# Patient Record
Sex: Female | Born: 1955 | Race: White | Hispanic: No | Smoking: Never smoker
Health system: Southern US, Community
[De-identification: ages and names within clinical notes are randomized; demographics above are authoritative.]

## PROBLEM LIST (undated history)

## (undated) DIAGNOSIS — N200 Calculus of kidney: Secondary | ICD-10-CM

## (undated) DIAGNOSIS — J189 Pneumonia, unspecified organism: Secondary | ICD-10-CM

## (undated) DIAGNOSIS — M069 Rheumatoid arthritis, unspecified: Secondary | ICD-10-CM

## (undated) DIAGNOSIS — D649 Anemia, unspecified: Secondary | ICD-10-CM

## (undated) DIAGNOSIS — K8689 Other specified diseases of pancreas: Secondary | ICD-10-CM

## (undated) DIAGNOSIS — R109 Unspecified abdominal pain: Secondary | ICD-10-CM

## (undated) DIAGNOSIS — K859 Acute pancreatitis without necrosis or infection, unspecified: Secondary | ICD-10-CM

## (undated) DIAGNOSIS — I10 Essential (primary) hypertension: Secondary | ICD-10-CM

## (undated) DIAGNOSIS — S22080A Wedge compression fracture of T11-T12 vertebra, initial encounter for closed fracture: Secondary | ICD-10-CM

## (undated) DIAGNOSIS — F419 Anxiety disorder, unspecified: Secondary | ICD-10-CM

## (undated) DIAGNOSIS — M4846XA Fatigue fracture of vertebra, lumbar region, initial encounter for fracture: Secondary | ICD-10-CM

## (undated) DIAGNOSIS — K760 Fatty (change of) liver, not elsewhere classified: Secondary | ICD-10-CM

## (undated) DIAGNOSIS — E876 Hypokalemia: Secondary | ICD-10-CM

## (undated) DIAGNOSIS — G8929 Other chronic pain: Secondary | ICD-10-CM

## (undated) HISTORY — PX: EXPLORATORY LAPAROTOMY W/ BOWEL RESECTION: SHX1544

## (undated) HISTORY — DX: Wedge compression fracture of t11-T12 vertebra, initial encounter for closed fracture: S22.080A

## (undated) HISTORY — DX: Other specified diseases of pancreas: K86.89

## (undated) HISTORY — PX: KNEE SURGERY: SHX244

## (undated) HISTORY — PX: KIDNEY STONE SURGERY: SHX686

## (undated) HISTORY — PX: OTHER SURGICAL HISTORY: SHX169

## (undated) HISTORY — PX: TOTAL HIP ARTHROPLASTY: SHX124

## (undated) HISTORY — PX: APPENDECTOMY: SHX54

## (undated) HISTORY — PX: ABDOMINAL HYSTERECTOMY: SHX81

## (undated) HISTORY — PX: CHOLECYSTECTOMY: SHX55

## (undated) HISTORY — PX: CARDIAC CATHETERIZATION: SHX172

## (undated) SURGERY — EGD (ESOPHAGOGASTRODUODENOSCOPY)
Anesthesia: Moderate Sedation | Laterality: Left

---

## 2006-03-22 DIAGNOSIS — M069 Rheumatoid arthritis, unspecified: Secondary | ICD-10-CM

## 2006-03-22 HISTORY — DX: Rheumatoid arthritis, unspecified: M06.9

## 2007-02-20 HISTORY — PX: ESOPHAGOGASTRODUODENOSCOPY: SHX1529

## 2009-03-22 DIAGNOSIS — S22080A Wedge compression fracture of T11-T12 vertebra, initial encounter for closed fracture: Secondary | ICD-10-CM

## 2009-03-22 HISTORY — DX: Wedge compression fracture of T11-T12 vertebra, initial encounter for closed fracture: S22.080A

## 2009-08-20 HISTORY — PX: ESOPHAGOGASTRODUODENOSCOPY: SHX1529

## 2010-04-22 HISTORY — PX: ESOPHAGOGASTRODUODENOSCOPY: SHX1529

## 2010-08-21 HISTORY — PX: ESOPHAGOGASTRODUODENOSCOPY: SHX1529

## 2011-03-20 ENCOUNTER — Inpatient Hospital Stay (HOSPITAL_COMMUNITY)
Admission: EM | Admit: 2011-03-20 | Discharge: 2011-03-23 | DRG: 392 | Disposition: A | Discharge status: Home / Self Care | Attending: Internal Medicine | Admitting: Internal Medicine

## 2011-03-20 ENCOUNTER — Other Ambulatory Visit: Payer: Self-pay

## 2011-03-20 DIAGNOSIS — E876 Hypokalemia: Secondary | ICD-10-CM | POA: Diagnosis present

## 2011-03-20 DIAGNOSIS — M069 Rheumatoid arthritis, unspecified: Secondary | ICD-10-CM | POA: Diagnosis present

## 2011-03-20 DIAGNOSIS — R112 Nausea with vomiting, unspecified: Secondary | ICD-10-CM | POA: Diagnosis present

## 2011-03-20 DIAGNOSIS — K760 Fatty (change of) liver, not elsewhere classified: Secondary | ICD-10-CM | POA: Diagnosis present

## 2011-03-20 DIAGNOSIS — K7689 Other specified diseases of liver: Secondary | ICD-10-CM | POA: Diagnosis present

## 2011-03-20 DIAGNOSIS — G8929 Other chronic pain: Secondary | ICD-10-CM

## 2011-03-20 DIAGNOSIS — R109 Unspecified abdominal pain: Secondary | ICD-10-CM | POA: Diagnosis present

## 2011-03-20 DIAGNOSIS — K5909 Other constipation: Principal | ICD-10-CM | POA: Diagnosis present

## 2011-03-20 DIAGNOSIS — Z79899 Other long term (current) drug therapy: Secondary | ICD-10-CM

## 2011-03-20 DIAGNOSIS — IMO0001 Reserved for inherently not codable concepts without codable children: Secondary | ICD-10-CM | POA: Diagnosis not present

## 2011-03-20 DIAGNOSIS — R030 Elevated blood-pressure reading, without diagnosis of hypertension: Secondary | ICD-10-CM | POA: Diagnosis present

## 2011-03-20 DIAGNOSIS — K5900 Constipation, unspecified: Secondary | ICD-10-CM | POA: Diagnosis present

## 2011-03-20 HISTORY — DX: Hypokalemia: E87.6

## 2011-03-20 HISTORY — DX: Fatty (change of) liver, not elsewhere classified: K76.0

## 2011-03-20 HISTORY — DX: Rheumatoid arthritis, unspecified: M06.9

## 2011-03-20 LAB — COMPREHENSIVE METABOLIC PANEL
ALT: 13 U/L (ref 0–35)
AST: 23 U/L (ref 0–37)
Albumin: 3.8 g/dL (ref 3.5–5.2)
Alkaline Phosphatase: 109 U/L (ref 39–117)
Chloride: 101 mEq/L (ref 96–112)
Potassium: 3.3 mEq/L — ABNORMAL LOW (ref 3.5–5.1)
Sodium: 139 mEq/L (ref 135–145)
Total Bilirubin: 0.3 mg/dL (ref 0.3–1.2)

## 2011-03-20 LAB — DIFFERENTIAL
Basophils Absolute: 0 10*3/uL (ref 0.0–0.1)
Basophils Relative: 1 % (ref 0–1)
Neutro Abs: 2.4 10*3/uL (ref 1.7–7.7)
Neutrophils Relative %: 58 % (ref 43–77)

## 2011-03-20 LAB — CBC
MCHC: 32.1 g/dL (ref 30.0–36.0)
RDW: 13.7 % (ref 11.5–15.5)
WBC: 4.1 10*3/uL (ref 4.0–10.5)

## 2011-03-20 LAB — TROPONIN I: Troponin I: <0.3 ng/mL (ref <0.30)

## 2011-03-20 MED ORDER — SODIUM CHLORIDE 0.9 % IV BOLUS (SEPSIS)
500.0000 mL | Freq: Once | INTRAVENOUS | Status: AC
  Administered 2011-03-20: 1000 mL via INTRAVENOUS

## 2011-03-20 MED ORDER — SODIUM CHLORIDE 0.9 % IV SOLN
INTRAVENOUS | Status: DC
  Administered 2011-03-20: 22:00:00 via INTRAVENOUS

## 2011-03-20 MED ORDER — HYDROMORPHONE HCL PF 1 MG/ML IJ SOLN
1.0000 mg | Freq: Once | INTRAMUSCULAR | Status: AC
  Administered 2011-03-20: 1 mg via INTRAVENOUS
  Filled 2011-03-20: qty 1

## 2011-03-20 MED ORDER — ONDANSETRON HCL 4 MG/2ML IJ SOLN
4.0000 mg | Freq: Once | INTRAMUSCULAR | Status: AC
  Administered 2011-03-20: 4 mg via INTRAVENOUS
  Filled 2011-03-20: qty 2

## 2011-03-20 NOTE — ED Provider Notes (Addendum)
This chart was scribed for [employer]. Rubin Payor, MD by Wallis Mart. The patient was seen in room APA11/APA11 and the patient's care was started at 9:28 PM.      CSN: [id number]  Arrival date & time 03/20/11  2038   First MD Initiated Contact with Patient 03/20/11 2127      Chief Complaint  Patient presents with  . Abdominal Pain  . Emesis    (Consider location/radiation/quality/duration/timing/severity/associated sxs/prior treatment) HPI Jordan Haney is a 55 y.o. female who presents to the Emergency Department complaining of gradual onset, intermittent, severe abdominal pain since 12/23. Pt was seen at [facility] yesterday and given an antinausea medicine that she was allergic to.  Pain radiates to her back and nothing improves or worsens the pain.  Pt c/o associated vomiting, constipation, cp.  Denies diarrhea, fever, dysuria.  Pt gets similar episodes once every few weeks.  Pt w/ h/o pancreatitis, gall bladder rupture w/ gall stones.    PCP: Dr. Henreitta Leber Past Medical History  Diagnosis Date  . RA (rheumatoid arthritis)   . MI (myocardial infarction)     Past Surgical History  Procedure Date  . Cholecystectomy   . Knee surgery   . Appendectomy     No family history on file.  History  Substance Use Topics  . Smoking status: Never Smoker   . Smokeless tobacco: Not on file  . Alcohol Use: No    OB History    Grav Para Term Preterm Abortions TAB SAB Ect Mult Living                  Review of Systems  10 Systems reviewed and are negative for acute change except as noted in the HPI.  Allergies  Compazine; Phenergan; and Vistaril  Home Medications   Current Outpatient Rx  Name Route Sig Dispense Refill  . ADALIMUMAB 40 MG/0.8ML [place] KIT Subcutaneous Inject 40 mg into the skin every 14 (fourteen) days.      . ASPIRIN EC 325 MG PO TBEC Oral Take 650 mg by mouth daily.      Marland Kitchen ESOMEPRAZOLE MAGNESIUM 40 MG PO CPDR Oral Take 40 mg by mouth daily before  breakfast.      . NITROGLYCERIN 0.4 MG SL SUBL Sublingual Place 0.4 mg under the tongue every 5 (five) minutes as needed. Chest pain     . OXYCODONE-ACETAMINOPHEN 5-325 MG PO TABS Oral Take 1 tablet by mouth every 4 (four) hours as needed. pain       BP 156/98  Pulse 84  Temp(Src) 99 F (37.2 C) (Oral)  Resp 22  Ht 5\' 4"  (1.626 m)  Wt 145 lb (65.772 kg)  BMI 24.89 kg/m2  SpO2 99%  Physical Exam  Nursing note and vitals reviewed. Constitutional: She is oriented to person, place, and time. She appears well-developed and well-nourished.       Pt appears uncomfortable  HENT:  Head: Normocephalic and atraumatic.  Eyes: EOM are normal. Pupils are equal, round, and reactive to light.  Neck: Normal range of motion. Neck supple. No tracheal deviation present.  Cardiovascular: Normal rate, regular rhythm and normal heart sounds.   Pulmonary/Chest: Effort normal and breath sounds normal. No respiratory distress.  Abdominal: Soft. Bowel sounds are normal. She exhibits no distension. There is tenderness. There is no rebound and no guarding.       Diffused abdominal tenderness  Musculoskeletal: Normal range of motion. She exhibits no edema.  Neurological: She is alert and oriented to person,  place, and time. No sensory deficit.  Skin: Skin is warm and dry.  Psychiatric: She has a normal mood and affect. Her behavior is normal.    ED Course  Procedures (including critical care time) DIAGNOSTIC STUDIES: Oxygen Saturation is 99% on room air, normal by my interpretation.    COORDINATION OF CARE:    Labs Reviewed  COMPREHENSIVE METABOLIC PANEL - Abnormal; Notable for the following:    Potassium 3.3 (*)    Glucose, Bld 100 (*)    BUN 3 (*)    All other components within normal limits  CBC  DIFFERENTIAL  LIPASE, BLOOD  TROPONIN I   No results found.   1. Chronic abdominal pain   2. Hypokalemia       MDM  Acute on chronic abdominal pain with vomiting. She has reportedly been  extensively worked up for this previously. She was seen at [facility] yesterday and had a CT. It showed some chronic changes. Lab work is reasuring. She has been unable to tolerate orals. She will be admitted for further evaluation  I personally performed the services described in this documentation, which was scribed in my presence. The recorded information has been reviewed and considered.    Date: 03/21/2011  Rate: 75  Rhythm: normal sinus rhythm  QRS Axis: normal  Intervals: normal  ST/T Wave abnormalities: normal  Conduction Disutrbances:none  Narrative Interpretation:   Old EKG Reviewed: none available       Juliet Rude. Rubin Payor, MD 03/21/11 0031  Juliet Rude. Rubin Payor, MD 03/21/11 0040

## 2011-03-20 NOTE — ED Notes (Signed)
Pt stated feeling a little better after meds, linens and gown changed, iv had been leaking.  retaped and infusing w/o difficulty.

## 2011-03-20 NOTE — ED Notes (Addendum)
Pt presents with mid upper abdominal pain and vomiting since 12/23. Pt states yesterday she was seen at [facility] and was given an RX for an antinausea medication she is allergic to. Pt also was not given an Rx for pain medication per husband pt sttempted to go back today and has waited 5 hours without being seen.

## 2011-03-20 NOTE — ED Notes (Signed)
Stated did not get scripts filled today and does not have anything for pain at home.

## 2011-03-20 NOTE — ED Notes (Signed)
Pt reports having severe ab pain for 1 week, was seen at another local er last hs, went again today but left d/t wait time, was told she had pancreatitis.

## 2011-03-21 ENCOUNTER — Emergency Department (HOSPITAL_COMMUNITY)

## 2011-03-21 ENCOUNTER — Encounter (HOSPITAL_COMMUNITY): Payer: Self-pay | Admitting: Internal Medicine

## 2011-03-21 DIAGNOSIS — R112 Nausea with vomiting, unspecified: Secondary | ICD-10-CM | POA: Diagnosis present

## 2011-03-21 DIAGNOSIS — M069 Rheumatoid arthritis, unspecified: Secondary | ICD-10-CM | POA: Diagnosis present

## 2011-03-21 DIAGNOSIS — K5900 Constipation, unspecified: Secondary | ICD-10-CM | POA: Diagnosis present

## 2011-03-21 DIAGNOSIS — E876 Hypokalemia: Secondary | ICD-10-CM

## 2011-03-21 DIAGNOSIS — R109 Unspecified abdominal pain: Secondary | ICD-10-CM | POA: Diagnosis present

## 2011-03-21 DIAGNOSIS — K760 Fatty (change of) liver, not elsewhere classified: Secondary | ICD-10-CM

## 2011-03-21 HISTORY — DX: Fatty (change of) liver, not elsewhere classified: K76.0

## 2011-03-21 HISTORY — DX: Hypokalemia: E87.6

## 2011-03-21 LAB — URINALYSIS, ROUTINE W REFLEX MICROSCOPIC
Glucose, UA: NEGATIVE mg/dL
Hgb urine dipstick: NEGATIVE
Leukocytes, UA: NEGATIVE
Protein, ur: NEGATIVE mg/dL
Specific Gravity, Urine: 1.01 (ref 1.005–1.030)
Urobilinogen, UA: 0.2 mg/dL (ref 0.0–1.0)

## 2011-03-21 LAB — TSH: TSH: 2.88 u[IU]/mL (ref 0.350–4.500)

## 2011-03-21 LAB — MAGNESIUM: Magnesium: 1.9 mg/dL (ref 1.5–2.5)

## 2011-03-21 MED ORDER — HYDROMORPHONE HCL PF 1 MG/ML IJ SOLN
1.0000 mg | Freq: Once | INTRAMUSCULAR | Status: AC
  Administered 2011-03-21: 1 mg via INTRAVENOUS
  Filled 2011-03-21: qty 1

## 2011-03-21 MED ORDER — METOCLOPRAMIDE HCL 5 MG/ML IJ SOLN
10.0000 mg | Freq: Four times a day (QID) | INTRAMUSCULAR | Status: DC
  Administered 2011-03-22 (×2): 10 mg via INTRAVENOUS
  Filled 2011-03-21 (×2): qty 2

## 2011-03-21 MED ORDER — HYDROMORPHONE HCL PF 1 MG/ML IJ SOLN
0.5000 mg | INTRAMUSCULAR | Status: DC | PRN
  Administered 2011-03-22 – 2011-03-23 (×7): 0.5 mg via INTRAVENOUS
  Filled 2011-03-21 (×7): qty 1

## 2011-03-21 MED ORDER — ONDANSETRON HCL 4 MG/2ML IJ SOLN: 4.0000 mg | INTRAMUSCULAR | Status: DC | PRN

## 2011-03-21 MED ORDER — ONDANSETRON HCL 4 MG PO TABS
4.0000 mg | ORAL_TABLET | Freq: Four times a day (QID) | ORAL | Status: DC | PRN
  Filled 2011-03-21: qty 1

## 2011-03-21 MED ORDER — HYDROMORPHONE HCL PF 1 MG/ML IJ SOLN
1.0000 mg | INTRAMUSCULAR | Status: DC | PRN
  Administered 2011-03-21: 2 mg via INTRAVENOUS
  Filled 2011-03-21: qty 2
  Filled 2011-03-21: qty 1

## 2011-03-21 MED ORDER — SODIUM CHLORIDE 0.9 % IV SOLN: INTRAVENOUS | Status: DC

## 2011-03-21 MED ORDER — FLEET ENEMA 7-19 GM/118ML RE ENEM
1.0000 | ENEMA | Freq: Every day | RECTAL | Status: DC | PRN
  Administered 2011-03-21: 1 via RECTAL

## 2011-03-21 MED ORDER — ONDANSETRON HCL 4 MG PO TABS
4.0000 mg | ORAL_TABLET | Freq: Four times a day (QID) | ORAL | Status: DC
  Administered 2011-03-21: 4 mg via ORAL

## 2011-03-21 MED ORDER — MAGNESIUM HYDROXIDE 400 MG/5ML PO SUSP: 30.0000 mL | Freq: Every day | ORAL | Status: DC

## 2011-03-21 MED ORDER — ONDANSETRON HCL 4 MG/2ML IJ SOLN
4.0000 mg | Freq: Four times a day (QID) | INTRAMUSCULAR | Status: DC
  Administered 2011-03-21 (×2): 4 mg via INTRAVENOUS
  Filled 2011-03-21 (×2): qty 2

## 2011-03-21 MED ORDER — TRAZODONE HCL 50 MG PO TABS: 25.0000 mg | ORAL_TABLET | Freq: Every evening | ORAL | Status: DC | PRN

## 2011-03-21 MED ORDER — SODIUM CHLORIDE 0.9 % IV SOLN
INTRAVENOUS | Status: DC
  Administered 2011-03-22: 02:00:00 via INTRAVENOUS
  Filled 2011-03-21 (×3): qty 1000

## 2011-03-21 MED ORDER — SODIUM CHLORIDE 0.9 % IV SOLN
INTRAVENOUS | Status: DC
  Administered 2011-03-21: 70 mL via INTRAVENOUS
  Administered 2011-03-21: 20:00:00 via INTRAVENOUS

## 2011-03-21 MED ORDER — ONDANSETRON HCL 4 MG/2ML IJ SOLN: 4.0000 mg | Freq: Three times a day (TID) | INTRAMUSCULAR | Status: DC | PRN

## 2011-03-21 MED ORDER — POLYETHYLENE GLYCOL 3350 17 G PO PACK: 17.0000 g | PACK | Freq: Two times a day (BID) | ORAL | Status: DC

## 2011-03-21 MED ORDER — ENOXAPARIN SODIUM 40 MG/0.4ML ~~LOC~~ SOLN
40.0000 mg | Freq: Every day | SUBCUTANEOUS | Status: DC
  Administered 2011-03-21 – 2011-03-23 (×3): 40 mg via SUBCUTANEOUS
  Filled 2011-03-21 (×3): qty 0.4

## 2011-03-21 MED ORDER — HYDROMORPHONE HCL PF 1 MG/ML IJ SOLN: 1.0000 mg | INTRAMUSCULAR | Status: DC | PRN

## 2011-03-21 MED ORDER — POTASSIUM CHLORIDE CRYS ER 20 MEQ PO TBCR
20.0000 meq | EXTENDED_RELEASE_TABLET | Freq: Two times a day (BID) | ORAL | Status: DC
  Administered 2011-03-21 (×2): 20 meq via ORAL
  Filled 2011-03-21 (×2): qty 1

## 2011-03-21 MED ORDER — HYDROMORPHONE HCL PF 1 MG/ML IJ SOLN
0.5000 mg | INTRAMUSCULAR | Status: DC | PRN
  Administered 2011-03-21 (×4): 0.5 mg via INTRAVENOUS
  Filled 2011-03-21 (×3): qty 1

## 2011-03-21 MED ORDER — MILK AND MOLASSES ENEMA
Freq: Once | RECTAL | Status: AC
Start: 2011-03-21 — End: 2011-03-22
  Administered 2011-03-22: 02:00:00 via RECTAL
  Filled 2011-03-21: qty 250

## 2011-03-21 MED ORDER — PANTOPRAZOLE SODIUM 40 MG IV SOLR
40.0000 mg | Freq: Two times a day (BID) | INTRAVENOUS | Status: DC
  Administered 2011-03-21 – 2011-03-23 (×5): 40 mg via INTRAVENOUS
  Filled 2011-03-21 (×7): qty 40

## 2011-03-21 MED ORDER — ONDANSETRON HCL 4 MG/2ML IJ SOLN
4.0000 mg | INTRAMUSCULAR | Status: DC
  Administered 2011-03-22: 4 mg via INTRAVENOUS
  Filled 2011-03-21: qty 2

## 2011-03-21 MED ORDER — POTASSIUM CHLORIDE IN NACL 20-0.9 MEQ/L-% IV SOLN
INTRAVENOUS | Status: DC
  Administered 2011-03-21: 04:00:00 via INTRAVENOUS

## 2011-03-21 MED ORDER — POTASSIUM CHLORIDE 10 MEQ/100ML IV SOLN
10.0000 meq | INTRAVENOUS | Status: AC
  Administered 2011-03-21 (×2): 10 meq via INTRAVENOUS
  Filled 2011-03-21: qty 300

## 2011-03-21 MED ORDER — MAGNESIUM CITRATE PO SOLN
1.0000 | Freq: Once | ORAL | Status: AC
  Administered 2011-03-21: 1 via ORAL
  Filled 2011-03-21: qty 296

## 2011-03-21 MED ORDER — ONDANSETRON HCL 4 MG PO TABS: 4.0000 mg | ORAL_TABLET | ORAL | Status: DC

## 2011-03-21 MED ORDER — KCL IN DEXTROSE-NACL 40-5-0.9 MEQ/L-%-% IV SOLN
INTRAVENOUS | Status: DC
  Filled 2011-03-21 (×3): qty 1000

## 2011-03-21 NOTE — Progress Notes (Signed)
Patient is refusing the IV runs of potassium. She states that "the pain in my arm at the IV site is just too bad its just too uncomfortable please make it stop".  Patient was told that the burning sensation is not uncommon when IV potassium runs through a peripheral IV. The IV is within normal limits and the site is functioning without problems.

## 2011-03-21 NOTE — Progress Notes (Signed)
The patient is a 55 year old woman with a past medical history significant for gallstone pancreatitis several years ago, rheumatoid arthritis, and coronary artery disease. She was admitted this morning for abdominal pain, nausea, and vomiting. She was recently evaluated at [facility] and [facility] for the same. The CT scan report from [facility] was reviewed. It revealed no inflammatory changes around the pancreas. It revealed fatty infiltration of the liver. It revealed mild nonspecific dilatation of the pancreatic duct. The abdominal x-ray performed in the ED here at [facility] revealed stool-filled colon. The patient says that she hasn't had a bowel movement in a week or more. She was briefly seen and examined. Vital signs and laboratory studies were reviewed. She is mildly hypokalemic. Runs of potassium chloride were ordered, however she has been having excruciating pain and cannot tolerate them. Even when the potassium was added to the standard maintenance IV fluids, she could not tolerate it. Therefore, she will be treated with by mouth potassium as tolerated. We will order a clear liquid diet as tolerated. We will order magnesium citrate for her to complete the bottle in several hours. She will be given Zofran scheduled every 6 hours IV. She will be given Protonix IV every 12 hours empirically.

## 2011-03-21 NOTE — ED Notes (Signed)
Pt care taken over from [place], [place].  Pt resting in bed with cc of pain in abdomin.  MD notifed

## 2011-03-21 NOTE — H&P (Signed)
PCP:   Anson [place], DO   Reports her gastroenterologist as: Dr. Samuella Cota and Allena Katz at [facility]  Chief Complaint:   abdominal pain for 4 days  HPI: Jordan Haney is an 55 y.o. Caucasian female.  Reports chronic pancreatitis after initial bout of acute gallstone pancreatitis several years ago; is on no medication for chronic pancreatitis; seen at [facility] yesterday and discharged home after a CAT scan which showed no acute findings and also no chronic evidence of chronic pancreatitis; not fill prescription pain persisted and patient presented to our emergency room, echo she said there is a 12/8 at the [facility]. Reports intractable nausea and vomiting.  Of note a search for her gastroenterologist reveals that Dr.Pandya is intact to pain specialist at [facility].  Denies fever or cough denies diarrhea.  Rewiew of Systems:  The patient denies , fever, weight loss,, vision loss, decreased hearing, hoarseness, chest pain, syncope, dyspnea on exertion, peripheral edema, balance deficits, hemoptysis, abdominal pain, melena, hematochezia, severe indigestion/heartburn, hematuria, incontinence, genital sores, muscle weakness, suspicious skin lesions, transient blindness, difficulty walking, depression, unusual weight change, abnormal bleeding, enlarged lymph nodes, angioedema, and breast masses.    Past Medical History  Diagnosis Date  . RA (rheumatoid arthritis)   . MI (myocardial infarction)     Past Surgical History  Procedure Date  . Cholecystectomy   . Knee surgery   . Appendectomy     Medications:  HOME MEDS: Prior to Admission medications   Medication Sig Start Date End Date Taking? Authorizing Provider  adalimumab (HUMIRA) 40 MG/0.8ML injection Inject 40 mg into the skin every 14 (fourteen) days.     Yes Historical Provider, MD  aspirin EC 325 MG tablet Take 650 mg by mouth daily.     Yes Historical Provider, MD  esomeprazole  (NEXIUM) 40 MG capsule Take 40 mg by mouth daily before breakfast.     Yes Historical Provider, MD  nitroGLYCERIN (NITROSTAT) 0.4 MG SL tablet Place 0.4 mg under the tongue every 5 (five) minutes as needed. Chest pain    Yes Historical Provider, MD  oxyCODONE-acetaminophen (PERCOCET) 5-325 MG per tablet Take 1 tablet by mouth every 4 (four) hours as needed. pain    Yes Historical Provider, MD     Allergies:  Allergies  Allergen Reactions  . Compazine Shortness Of Breath  . Phenergan Shortness Of Breath  . Vistaril (Hyzine) Shortness Of Breath    Social History:   reports that she has never smoked. She does not have any smokeless tobacco history on file. She reports that she does not drink alcohol or use illicit drugs.  Family History: No family history on file.   Physical Exam: Filed Vitals:   03/20/11 2047 03/20/11 2229  BP: 147/104 156/98  Pulse: 87 84  Temp: 98.6 F (37 C) 99 F (37.2 C)  TempSrc: Oral Oral  Resp: 16 22  Height: 5\' 4"  (1.626 m)   Weight: 65.772 kg (145 lb)   SpO2: 99% 99%   Blood pressure 156/98, pulse 84, temperature 99 F (37.2 C), temperature source Oral, resp. rate 22, height 5\' 4"  (1.626 m), weight 65.772 kg (145 lb), SpO2 99.00%.  GEN:  Distress looking Caucasian lady lying in the stretcher in painful distress; cooperative with exam PSYCH:  alert and oriented x4; appears both anxious and depressed HEENT: Mucous membranes pink and dry and anicteric; PERRLA; EOM intact; no cervical lymphadenopathy nor thyromegaly or carotid bruit; no JVD; Breasts:: Not examined CHEST WALL: No  tenderness CHEST: Normal respiration, clear to auscultation bilaterally HEART: Regular rate and rhythm; no murmurs rubs or gallops BACK: No kyphosis or scoliosis; no CVA tenderness ABDOMEN: Diffuse abdominal tenderness; no rebound no masses, no organomegaly, normal abdominal bowel sounds; no pannus; no intertriginous candida. Rectal Exam: Not done EXTREMITIES: No bone or  joint deformity;; no edema; no ulcerations. Genitalia: not examined PULSES: 2+ and symmetric SKIN: Normal hydration no rash or ulceration CNS: Cranial nerves 2-12 grossly intact no focal neurologic deficit   Labs & Imaging Results for orders placed during the hospital encounter of 03/20/11 (from the past 48 hour(s))  CBC     Status: Normal   Collection Time   03/20/11  9:37 PM      Component Value Range Comment   WBC 4.1  4.0 - 10.5 (K/uL)    RBC 4.25  3.87 - 5.11 (MIL/uL)    Hemoglobin 12.4  12.0 - 15.0 (g/dL)    HCT 16.1  09.6 - 04.5 (%)    MCV 90.8  78.0 - 100.0 (fL)    MCH 29.2  26.0 - 34.0 (pg)    MCHC 32.1  30.0 - 36.0 (g/dL)    RDW 40.9  81.1 - 91.4 (%)    Platelets 261  150 - 400 (K/uL)   DIFFERENTIAL     Status: Normal   Collection Time   03/20/11  9:37 PM      Component Value Range Comment   Neutrophils Relative 58  43 - 77 (%)    Neutro Abs 2.4  1.7 - 7.7 (K/uL)    Lymphocytes Relative 31  12 - 46 (%)    Lymphs Abs 1.3  0.7 - 4.0 (K/uL)    Monocytes Relative 9  3 - 12 (%)    Monocytes Absolute 0.4  0.1 - 1.0 (K/uL)    Eosinophils Relative 1  0 - 5 (%)    Eosinophils Absolute 0.1  0.0 - 0.7 (K/uL)    Basophils Relative 1  0 - 1 (%)    Basophils Absolute 0.0  0.0 - 0.1 (K/uL)   COMPREHENSIVE METABOLIC PANEL     Status: Abnormal   Collection Time   03/20/11  9:37 PM      Component Value Range Comment   Sodium 139  135 - 145 (mEq/L)    Potassium 3.3 (*) 3.5 - 5.1 (mEq/L)    Chloride 101  96 - 112 (mEq/L)    CO2 31  19 - 32 (mEq/L)    Glucose, Bld 100 (*) 70 - 99 (mg/dL)    BUN 3 (*) 6 - 23 (mg/dL)    Creatinine, Ser 7.82  0.50 - 1.10 (mg/dL)    Calcium 9.8  8.4 - 10.5 (mg/dL)    Total Protein 7.0  6.0 - 8.3 (g/dL)    Albumin 3.8  3.5 - 5.2 (g/dL)    AST 23  0 - 37 (U/L)    ALT 13  0 - 35 (U/L)    Alkaline Phosphatase 109  39 - 117 (U/L)    Total Bilirubin 0.3  0.3 - 1.2 (mg/dL)    GFR calc non Af Amer >90  >90 (mL/min)    GFR calc Af Amer >90  >90 (mL/min)    LIPASE, BLOOD     Status: Normal   Collection Time   03/20/11  9:37 PM      Component Value Range Comment   Lipase 21  11 - 59 (U/L)   TROPONIN I  Status: Normal   Collection Time   03/20/11  9:37 PM      Component Value Range Comment   Troponin I <0.30  <0.30 (ng/mL)    No results found.    Assessment Present on Admission:  .Abdominal pain .Nausea and vomiting .Rheumatoid arthritis   PLAN: Since this lady's pain is not been used by interventions in the emergency room, we will admit her for intravenous hydration intravenous pain medication and keep her nil by mouth until the pain subsides. Will attempt to get records from [facility] about the exact nature of her condition.   Get a plain x-ray here looking for medications in her pancreas. We note that the CT scan from yesterday suggested constipation; we will order enemas as necessary to be sure this is not contributing to her problem.   Other plans as per orders.   Taelyn Nemes 03/21/2011, 2:33 AM

## 2011-03-22 DIAGNOSIS — IMO0001 Reserved for inherently not codable concepts without codable children: Secondary | ICD-10-CM | POA: Diagnosis not present

## 2011-03-22 LAB — COMPREHENSIVE METABOLIC PANEL
ALT: 10 U/L (ref 0–35)
AST: 22 U/L (ref 0–37)
Albumin: 3.4 g/dL — ABNORMAL LOW (ref 3.5–5.2)
Alkaline Phosphatase: 101 U/L (ref 39–117)
Potassium: 3.9 mEq/L (ref 3.5–5.1)
Sodium: 142 mEq/L (ref 135–145)
Total Protein: 6.5 g/dL (ref 6.0–8.3)

## 2011-03-22 LAB — CBC
HCT: 37.5 % (ref 36.0–46.0)
MCHC: 32 g/dL (ref 30.0–36.0)
MCV: 90.4 fL (ref 78.0–100.0)
RDW: 13.7 % (ref 11.5–15.5)

## 2011-03-22 MED ORDER — KCL IN DEXTROSE-NACL 40-5-0.9 MEQ/L-%-% IV SOLN
INTRAVENOUS | Status: DC
  Administered 2011-03-22: 11:00:00 via INTRAVENOUS
  Filled 2011-03-22 (×4): qty 1000

## 2011-03-22 MED ORDER — POTASSIUM CHLORIDE IN NACL 40-0.9 MEQ/L-% IV SOLN
INTRAVENOUS | Status: AC
  Filled 2011-03-22: qty 1000

## 2011-03-22 MED ORDER — LORAZEPAM 2 MG/ML IJ SOLN
0.5000 mg | Freq: Once | INTRAMUSCULAR | Status: AC
  Administered 2011-03-22: 0.5 mg via INTRAVENOUS
  Filled 2011-03-22: qty 1

## 2011-03-22 MED ORDER — ONDANSETRON HCL 4 MG PO TABS
4.0000 mg | ORAL_TABLET | Freq: Four times a day (QID) | ORAL | Status: DC
  Administered 2011-03-22 – 2011-03-23 (×2): 4 mg via ORAL
  Filled 2011-03-22 (×3): qty 1

## 2011-03-22 MED ORDER — METOCLOPRAMIDE HCL 5 MG/ML IJ SOLN: 5.0000 mg | Freq: Four times a day (QID) | INTRAMUSCULAR | Status: DC | PRN

## 2011-03-22 MED ORDER — METOCLOPRAMIDE HCL 5 MG/ML IJ SOLN: 5.0000 mg | Freq: Four times a day (QID) | INTRAMUSCULAR | Status: DC

## 2011-03-22 MED ORDER — ONDANSETRON HCL 4 MG/2ML IJ SOLN
4.0000 mg | Freq: Four times a day (QID) | INTRAMUSCULAR | Status: DC
  Administered 2011-03-22 – 2011-03-23 (×2): 4 mg via INTRAVENOUS
  Filled 2011-03-22 (×2): qty 2

## 2011-03-22 NOTE — Progress Notes (Signed)
Subjective: An NG tube was placed yesterday afternoon for persistent nausea and vomiting. Overnight, Reglan was added. Milk of molasses enema was given with success. This morning, she feels less abdominal pain and less nausea although she says that she is not feeling well. Per nursing, she's had 3 bowel movements since overnight.  Objective: Vital signs in last 24 hours: Filed Vitals:   03/21/11 2000 03/21/11 2135 03/22/11 0218 03/22/11 0609  BP: 156/97 157/99 158/104 151/104  Pulse: 112 78 82 80  Temp: 98 F (36.7 C) 98.2 F (36.8 C) 97.7 F (36.5 C) 98.2 F (36.8 C)  TempSrc: Oral Oral Oral Oral  Resp: 18 16 20 16   Height:      Weight:      SpO2: 98% 96% 94% 97%    Intake/Output Summary (Last 24 hours) at 03/22/11 0958 Last data filed at 03/22/11 0534  Gross per 24 hour  Intake   2651 ml  Output   1850 ml  Net    801 ml    Weight change:   Physical exam: Lungs: Clear to auscultation bilaterally. Heart: S1, S2, with borderline tachycardia. Abdomen: Less distended, mildly tender in the hypogastrium, no masses, no distention, no hepatosplenomegaly. Extremities: No pedal edema.   Lab Results: Basic Metabolic Panel:  Basename 03/22/11 0518 03/20/11 2137 03/20/11 2119  NA 142 139 --  K 3.9 3.3* --  CL 107 101 --  CO2 27 31 --  GLUCOSE 77 100* --  BUN 5* 3* --  CREATININE 0.56 0.55 --  CALCIUM 9.4 9.8 --  MG -- -- 1.9  PHOS -- -- --   Liver Function Tests:  [facility] 03/22/11 0518 03/20/11 2137  AST 22 23  ALT 10 13  ALKPHOS 101 109  BILITOT 0.3 0.3  PROT 6.5 7.0  ALBUMIN 3.4* 3.8    Basename 03/22/11 0518 03/20/11 2137  LIPASE 20 21  AMYLASE -- --   No results found for this basename: AMMONIA:2 in the last 72 hours CBC:  Basename 03/22/11 0518 03/20/11 2137  WBC 6.4 4.1  NEUTROABS -- 2.4  HGB 12.0 12.4  HCT 37.5 38.6  MCV 90.4 90.8  PLT 258 261   Cardiac Enzymes:  Basename 03/20/11 2137  CKTOTAL --  CKMB --  CKMBINDEX --  TROPONINI <0.30    BNP: No results found for this basename: PROBNP:3 in the last 72 hours D-Dimer: No results found for this basename: DDIMER:2 in the last 72 hours CBG: No results found for this basename: GLUCAP:6 in the last 72 hours Hemoglobin A1C: No results found for this basename: HGBA1C in the last 72 hours Fasting Lipid Panel: No results found for this basename: CHOL,HDL,LDLCALC,TRIG,CHOLHDL,LDLDIRECT in the last 72 hours Thyroid Function Tests:  [facility] 03/20/11 2119  TSH 2.880  T4TOTAL --  FREET4 0.89  T3FREE --  THYROIDAB --   Anemia Panel: No results found for this basename: VITAMINB12,FOLATE,FERRITIN,TIBC,IRON,RETICCTPCT in the last 72 hours Coagulation: No results found for this basename: LABPROT:2,INR:2 in the last 72 hours Urine Drug Screen: Drugs of Abuse  No results found for this basename: labopia, cocainscrnur, labbenz, amphetmu, thcu, labbarb    Alcohol Level: No results found for this basename: ETH:2 in the last 72 hours   Micro: Recent Results (from the past 240 hour(s))  MRSA PCR SCREENING     Status: Normal   Collection Time   03/21/11  3:53 AM      Component Value Range Status Comment   MRSA by PCR NEGATIVE  NEGATIVE  Final     Studies/Results: Dg Abd 1 View  03/21/2011  *RADIOLOGY REPORT*  Clinical Data: Abdominal pain, vomiting, and constipation  ABDOMEN - 1 VIEW  Comparison: 11/01/2008  Findings: Gas and stool throughout the colon without distension. No small bowel dilatation.  Vascular calcifications.  Surgical clips in the right upper quadrant and right lower quadrant. Degenerative changes in the spine.  Calcified phleboliths. Increased density in the pelvis consistent with filled bladder.  IMPRESSION: Stool filled colon.  Nonobstructive bowel gas pattern.  Original Report Authenticated By: Marlon Pel, M.D.    Medications: I have reviewed the patient's current medications.  Assessment: Active Problems:  Abdominal pain  Nausea and vomiting   Rheumatoid arthritis  Constipation  Hypokalemia  Fatty liver  Elevated blood pressure  1. Abdominal pain, nausea, and vomiting. Likely secondary to chronic constipation. She is status post enemas. She has had multiple bowel movements. She is also on Reglan, IV Protonix, and Zofran. Her urinalysis is within normal limits. Her liver transaminases and lipase are within normal limits. Her WBC is within normal limits. She is afebrile. Her thyroid function is within normal limits.  Elevated blood pressure. This may be secondary to IV fluids.  Hypokalemia. Repleted and in the IV fluids. Magnesium level is within normal limits.  Rheumatoid arthritis. Stable. She is on Humira chronically.  Plan:  1. We'll clamp the NG tube and start a clear liquid diet. If she is able to tolerate a clear liquid diet without nausea and vomiting, will discontinue the NG tube. We'll continue IV Zofran as scheduled and change Reglan to when necessary.  Will decrease the IV fluids. We'll add D5 to the IV fluids. We'll monitor her blood pressure. Consider adding an antihypertensive medication, but will monitor for the next 24-48 hours before starting one.  The patient was advised to increase activity.   LOS: 2 days   Jonathin Heinicke 03/22/2011, 9:58 AM

## 2011-03-22 NOTE — Progress Notes (Signed)
Order noted to d/c NG tube if Patient tolerated Clear Liquid diet well.  Lunch meal was tolerated well with intake of 100%.  Patient denies Nausea/Vomiting.  NG tube removed without difficulty and intact.  Patient requesting sprite, and tolerating well.  No complaints.  Is in stable condition. Spouse at bedside.

## 2011-03-23 HISTORY — PX: BACK SURGERY: SHX140

## 2011-03-23 LAB — BASIC METABOLIC PANEL
BUN: 3 mg/dL — ABNORMAL LOW (ref 6–23)
GFR calc non Af Amer: >90 mL/min (ref >90)
Glucose, Bld: 94 mg/dL (ref 70–99)
Potassium: 4.4 mEq/L (ref 3.5–5.1)

## 2011-03-23 MED ORDER — LORAZEPAM 0.5 MG PO TABS
0.5000 mg | ORAL_TABLET | Freq: Every day | ORAL | Status: AC
  Administered 2011-03-23: 0.5 mg via ORAL
  Filled 2011-03-23: qty 1

## 2011-03-23 MED ORDER — POLYETHYLENE GLYCOL 3350 17 G PO PACK: 17.0000 g | PACK | Freq: Two times a day (BID) | ORAL | Status: AC

## 2011-03-23 MED ORDER — OXYCODONE-ACETAMINOPHEN 5-325 MG PO TABS: 1.0000 | ORAL_TABLET | ORAL | Status: DC | PRN

## 2011-03-23 MED ORDER — ONDANSETRON HCL 4 MG PO TABS: 4.0000 mg | ORAL_TABLET | Freq: Four times a day (QID) | ORAL | Status: AC

## 2011-03-23 MED ORDER — SODIUM CHLORIDE 0.9 % IJ SOLN
INTRAMUSCULAR | Status: AC
  Administered 2011-03-23: 10 mL
  Filled 2011-03-23: qty 3

## 2011-03-23 MED ORDER — SENNOSIDES 8.6 MG PO TABS: 1.0000 | ORAL_TABLET | Freq: Every day | ORAL | Status: DC

## 2011-03-23 NOTE — Progress Notes (Signed)
Pt discharged with instructions, she verbalized understanding.  She left the floor via w/c in stable condition.

## 2011-03-23 NOTE — Discharge Summary (Signed)
Physician Discharge Summary  Jordan Haney MRN: [id number] DOB/AGE: [birth year] 56 y.o.  PCP: Anson [place], DO   Admit date: 03/20/2011 Discharge date: 03/23/2011  Discharge Diagnoses:  1. Abdominal pain, likely secondary to constipation. 2. Chronic constipation. Resolved. 3. Nausea and vomiting, secondary to constipation. 4. Elevated blood pressure associated with pain. Resolved. 5. Hypokalemia, supplemented. 6. Fatty liver per outpatient CT scan at [facility]. 7. Rheumatoid arthritis, on Humira chronically.    Current Discharge Medication List    START taking these medications   Details  ondansetron (ZOFRAN) 4 MG tablet Take 1 tablet (4 mg total) by mouth every 6 (six) hours. Qty: 20 tablet, Refills: 0    polyethylene glycol (MIRALAX / GLYCOLAX) packet Take 17 g by mouth 2 (two) times daily. TO AVOID CONSTIPATION. Qty: 14 each, Refills: 0    senna (SENOKOT) 8.6 MG tablet Take 1 tablet (8.6 mg total) by mouth daily. FOR CONSTIPATION.      CONTINUE these medications which have CHANGED   Details  oxyCODONE-acetaminophen (PERCOCET) 5-325 MG per tablet Take 1 tablet by mouth every 4 (four) hours as needed. pain Qty: 30 tablet, Refills: 0      CONTINUE these medications which have NOT CHANGED   Details  adalimumab (HUMIRA) 40 MG/0.8ML injection Inject 40 mg into the skin every 14 (fourteen) days.      aspirin EC 325 MG tablet Take 650 mg by mouth daily.      esomeprazole (NEXIUM) 40 MG capsule Take 40 mg by mouth daily before breakfast.      nitroGLYCERIN (NITROSTAT) 0.4 MG SL tablet Place 0.4 mg under the tongue every 5 (five) minutes as needed. Chest pain         Discharge Condition: Improved and stable.  Disposition: Home.   Consults: None.   Significant Diagnostic Studies: Dg Abd 1 View  03/21/2011  *RADIOLOGY REPORT*  Clinical Data: Abdominal pain, vomiting, and constipation  ABDOMEN - 1 VIEW  Comparison: 11/01/2008  Findings: Gas and  stool throughout the colon without distension. No small bowel dilatation.  Vascular calcifications.  Surgical clips in the right upper quadrant and right lower quadrant. Degenerative changes in the spine.  Calcified phleboliths. Increased density in the pelvis consistent with filled bladder.  IMPRESSION: Stool filled colon.  Nonobstructive bowel gas pattern.  Original Report Authenticated By: Marlon Pel, M.D.     Microbiology: Recent Results (from the past 240 hour(s))  MRSA PCR SCREENING     Status: Normal   Collection Time   03/21/11  3:53 AM      Component Value Range Status Comment   MRSA by PCR NEGATIVE  NEGATIVE  Final      Labs: Results for orders placed during the hospital encounter of 03/20/11 (from the past 48 hour(s))  COMPREHENSIVE METABOLIC PANEL     Status: Abnormal   Collection Time   03/22/11  5:18 AM      Component Value Range Comment   Sodium 142  135 - 145 (mEq/L)    Potassium 3.9  3.5 - 5.1 (mEq/L)    Chloride 107  96 - 112 (mEq/L)    CO2 27  19 - 32 (mEq/L)    Glucose, Bld 77  70 - 99 (mg/dL)    BUN 5 (*) 6 - 23 (mg/dL)    Creatinine, Ser 5.62  0.50 - 1.10 (mg/dL)    Calcium 9.4  8.4 - 10.5 (mg/dL)    Total Protein 6.5  6.0 -  8.3 (g/dL)    Albumin 3.4 (*) 3.5 - 5.2 (g/dL)    AST 22  0 - 37 (U/L)    ALT 10  0 - 35 (U/L)    Alkaline Phosphatase 101  39 - 117 (U/L)    Total Bilirubin 0.3  0.3 - 1.2 (mg/dL)    GFR calc non Af Amer >90  >90 (mL/min)    GFR calc Af Amer >90  >90 (mL/min)   CBC     Status: Normal   Collection Time   03/22/11  5:18 AM      Component Value Range Comment   WBC 6.4  4.0 - 10.5 (K/uL)    RBC 4.15  3.87 - 5.11 (MIL/uL)    Hemoglobin 12.0  12.0 - 15.0 (g/dL)    HCT 16.1  09.6 - 04.5 (%)    MCV 90.4  78.0 - 100.0 (fL)    MCH 28.9  26.0 - 34.0 (pg)    MCHC 32.0  30.0 - 36.0 (g/dL)    RDW 40.9  81.1 - 91.4 (%)    Platelets 258  150 - 400 (K/uL)   LIPASE, BLOOD     Status: Normal   Collection Time   03/22/11  5:18 AM       Component Value Range Comment   Lipase 20  11 - 59 (U/L)   BASIC METABOLIC PANEL     Status: Abnormal   Collection Time   03/23/11  6:13 AM      Component Value Range Comment   Sodium 141  135 - 145 (mEq/L)    Potassium 4.4  3.5 - 5.1 (mEq/L)    Chloride 105  96 - 112 (mEq/L)    CO2 31  19 - 32 (mEq/L)    Glucose, Bld 94  70 - 99 (mg/dL)    BUN 3 (*) 6 - 23 (mg/dL)    Creatinine, Ser 7.82  0.50 - 1.10 (mg/dL)    Calcium 95.6  8.4 - 10.5 (mg/dL)    GFR calc non Af Amer >90  >90 (mL/min)    GFR calc Af Amer >90  >90 (mL/min)      HPI : The patient is a 56 year old woman with a reported history of chronic pancreatitis, chronic constipation, and rheumatoid arthritis, who presented to the emergency department on 03/21/2011 with a chief complaint of abdominal pain for 4 days. She was not only seen and evaluated in the emergency department in [place], but also in [place] at [facility]. A CT scan of her abdomen was ordered at [facility]. It revealed no inflammatory changes around the pancreas, no pancreatic calcifications, fatty infiltration of liver, and a mild nonspecific dilatation of the pancreatic duct. The abdominal x-ray in the emergency department here at [facility], revealed a stool-filled colon and nonspecific bowel gas pattern. Her lab data were significant for a serum potassium of 3.3, normal liver transaminases, and normal lipase. She was admitted for further evaluation and management.  HOSPITAL COURSE: The patient was started on supportive treatment with as needed IV analgesics, as needed antiemetics, and IV fluids for hydration. Proton pump inhibitor therapy was started with Protonix IV. Her IV fluids were eventually changed to add dextrose and more potassium chloride for supplementation. She was started on oral laxative therapy and when necessary suppositories and enemas. However, because of the persistent nausea and vomiting, she was unable to tolerate oral laxatives.  Zofran was changed from as needed to scheduled. IV Reglan was started as  well. However, because of her persistent vomiting, an NG tube was placed for 24 hours. Multiple enemas were tried. Finally, milk and molasses enema proved to be successful.  The NG tube was clamped. Clear liquids were started following the multiple bowel movements. She tolerated the clear liquid diet well. Subsequently, the NG tube was discontinued. Her diet was advanced to a full liquid diet, which she tolerated well. Her abdominal pain subsided. Her nausea and vomiting completely resolved.   Her blood pressure was elevated during the first 24-48 hours of the hospitalization. She has no history of hypertension. The elevation was thought to be secondary to pain and discomfort. However, at the time of hospital discharge, her blood pressure normalized. Her serum potassium improved to 4.4 upon discharge. She remained afebrile. Her liver transaminases remained within normal limits. Her CBC was within normal limits. Her TSH and free T4 were within normal limits. Her urinalysis revealed no evidence of infection.   She was discharged to home with instructions to take laxatives daily and to eat a fiber rich diet. She was instructed to continue Nexium as well.    Discharge Exam:  Blood pressure 116/78, pulse 87, temperature 98.6 F (37 C), temperature source Oral, resp. rate 18, height 5\' 4"  (1.626 m), weight 68.5 kg (151 lb 0.2 oz), SpO2 97.00%.  Lungs: Clear to auscultation bilaterally. Heart: S1, S2, with a soft systolic murmur. Abdomen: Positive bowel sounds, mildly tender in epigastrium, no rebound, no guarding, no hepatosplenomegaly. No distention. Extremities: No pedal edema.   Discharge Orders    Future Orders Please Complete By Expires   Diet - low sodium heart healthy      Diet general      Increase activity slowly      Discharge instructions      Comments:   EAT A FIBER RICH DIET.      Follow-up Information     Make an appointment with Anson Oregon. (IN 1 WEEK)          Discharge time: 40 minutes.   Signed: Jarrin Staley 03/23/2011, 11:38 AM

## 2011-03-26 NOTE — Progress Notes (Signed)
Utilization review completed.  

## 2011-04-05 ENCOUNTER — Ambulatory Visit (INDEPENDENT_AMBULATORY_CARE_PROVIDER_SITE_OTHER): Admitting: Gastroenterology

## 2011-04-05 ENCOUNTER — Other Ambulatory Visit: Payer: Self-pay | Admitting: Gastroenterology

## 2011-04-05 ENCOUNTER — Encounter: Payer: Self-pay | Admitting: Gastroenterology

## 2011-04-05 DIAGNOSIS — R634 Abnormal weight loss: Secondary | ICD-10-CM

## 2011-04-05 DIAGNOSIS — R112 Nausea with vomiting, unspecified: Secondary | ICD-10-CM

## 2011-04-05 DIAGNOSIS — K920 Hematemesis: Secondary | ICD-10-CM

## 2011-04-05 DIAGNOSIS — K5900 Constipation, unspecified: Secondary | ICD-10-CM

## 2011-04-05 DIAGNOSIS — K279 Peptic ulcer, site unspecified, unspecified as acute or chronic, without hemorrhage or perforation: Secondary | ICD-10-CM

## 2011-04-05 DIAGNOSIS — R1013 Epigastric pain: Secondary | ICD-10-CM

## 2011-04-05 MED ORDER — SODIUM CHLORIDE 0.45 % IV SOLN: Freq: Once | INTRAVENOUS | Status: DC

## 2011-04-05 MED ORDER — POLYETHYLENE GLYCOL 3350 17 G PO PACK: 17.0000 g | PACK | Freq: Two times a day (BID) | ORAL | Status: DC

## 2011-04-05 NOTE — Progress Notes (Signed)
Tried to call pt multiple times- no answer and VM is not set up- I called her Emergency Contact and LMOVM with the importance of having pt call me back by 5pm today

## 2011-04-05 NOTE — Progress Notes (Signed)
REVIEWED.  NEEDS EGD IN OR JAN 15

## 2011-04-05 NOTE — Patient Instructions (Addendum)
We will try to get your records from Dr. Samuella Cota. We have scheduled you for an upper endoscopy. Please see separate instructions.  PLEASE STOP ALL ASPIRIN POWDERS AND NSAIDS. Increase Miralax to twice daily.  Go to nearest ER if you develop fresh blood in your emesis.

## 2011-04-05 NOTE — Assessment & Plan Note (Addendum)
PP N/V, hematemesis, weight loss, epigastric pain with multiple hospitalization in last six months at [facility] and at [facility]. Also ED visit [facility]. Finally received records. She has had multiple imaging studies and EGDs as outline above. H/O duodenal ulcers, gastritis. H/O mild duodenal abnormality on 02/2011 CT but not on most recent CT. Also some mild common hepatic duct and pancreatic duct dilation and normal LFTs. She has significant constipation/obstipation as well. Continues to use BC powders.   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    Given reported hematemesis and persistent BC use, prior duodenal ulcers and abnormality on recent CT, I offered her EGD. Patient has received conscious sedation in the past without difficultly. She will also need colonoscopy at later date but currently with n/v she is unable to prep.  I have discussed the risks, alternatives, benefits with regards to but not limited to the risk of reaction to medication, bleeding, infection, perforation and the patient is agreeable to proceed. Written consent to be obtained.   For constipation, increase miralax bid. If no obstruction noted on EGD, consider Linzess for chronic constipation.   She may ultimately need EUS for chronic abd pain.

## 2011-04-05 NOTE — Progress Notes (Signed)
Primary Care Physician:  Bridges, Steven D, DO  Primary Gastroenterologist:  Sandi Fields, MD   Chief Complaint  Patient presents with  . Pancreatitis    HX of  . Nausea  . Emesis    HPI:  Jordan Haney is a 56 y.o. female here for further evaluation of chronic abdominal pain with vomiting. She presents today stating that she's had vomiting of bilious material as well as hematemesis, epigastric pain off and on for over 1-1/2 years. She states her symptoms have been progressively worse over the last several months. Vomiting bilious material and cannot have BM. C/O point tenderness just right of epigastrium. C/O intractable vomiting. Only eating liquid diet but still with vomiting. C/O 27 pound weight loss in three months. C/O gross hematemesis and coffee ground emesis. Stools are white. No melena, brbpr. No heartburn, dysphagia, odynophagia. C/O chronic pain inbetween scapula since fell one year ago. H/O T12 compression fracture.   In hospital at [facility] for n/v, constipation/obstipation about two weeks ago. Had NG tube. Abd xray showed large stool load. Patient says she had fresh blood pumped from her stomach. She did not have GI consult.   She is not exactly forthcoming with her previous workup and her history continuously changes upon questioning. I asked her when the last time she saw her gastroenterologist, Dr. Pandya and she told me 3 years ago. Later she told me he did an EGD on her 6 months ago. She states she is not going back to see him. She states he just wants to send her to pain management. 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 States last EGD 6 months ago ok and she started taking BC powders once daily. She tells me she does not take pain medication regularly. Only takes lortab for severe pain related to RA. May not use for months. Not taking oxycodone at this time. I received records from [facility] regarding 01/2011 hospitalization. UDS positive for Methadone. I  asked any h/o methadone and she told me she had been given short term course with a knee surgery seven years ago. I asked specifically about recent use and she finally admitted, but states it was left over pill from 7 years ago. Then told me she takes Ativan as well which was not on her medication list.   She released from [facility] she has been taking Miralax 17g daily. BM once every five days. C/O pp abdominal swelling. No prior colonoscopy. Still vomiting on daily basis.   Per patient, biliary pancreatitis four years. ERCP. Another bout of pancreatitis 3 months later.    Current Outpatient Prescriptions  Medication Sig Dispense Refill  . adalimumab (HUMIRA) 40 MG/0.8ML injection Inject 40 mg into the skin every 14 (fourteen) days.        . aspirin EC 325 MG tablet Take 650 mg by mouth daily.        . Aspirin-Caffeine 845-65 MG PACK Take 1 packet by mouth daily.      . esomeprazole (NEXIUM) 40 MG capsule Take 40 mg by mouth daily before breakfast.        . HYDROcodone-acetaminophen (LORTAB 7.5) 7.5-500 MG per tablet Take 1 tablet by mouth every 6 (six) hours as needed.      . LORazepam (ATIVAN) 0.5 MG tablet Take 0.5 mg by mouth 2 (two) times daily.      . nitroGLYCERIN (NITROSTAT) 0.4 MG SL tablet Place 0.4 mg under the tongue every 5 (five) minutes as needed. Chest pain       .   oxyCODONE-acetaminophen (PERCOCET) 5-325 MG per tablet Take 1 tablet by mouth every 4 (four) hours as needed. pain  30 tablet  0  . polyethylene glycol (MIRALAX / GLYCOLAX) packet Take 17 g by mouth 2 (two) times daily.  14 each    . senna (SENOKOT) 8.6 MG tablet Take 1 tablet (8.6 mg total) by mouth daily. FOR CONSTIPATION.        Allergies as of 04/05/2011 - Review Complete 04/05/2011  Allergen Reaction Noted  . Compazine Shortness Of Breath 03/20/2011  . Phenergan Shortness Of Breath 03/20/2011  . Vistaril (hyzine) Shortness Of Breath 03/20/2011    Past Medical History  Diagnosis Date  . RA (rheumatoid arthritis)   2008  . MI (myocardial infarction) 2008  . Constipation 03/21/2011  . Hypokalemia 03/21/2011  . Fatty liver 03/21/2011  . Chronic pancreatitis     ?per patient she denies diagnosis  . T12 compression fracture 2011  . Duodenal ulcer     remote per patient. +BC powders, patient report negative EGD six months ago    Past Surgical History  Procedure Date  . Cholecystectomy   . Knee surgery   . Appendectomy   . Complete hysterectomy   . Ventral wall hernia   . Esophagogastroduodenoscopy 08/2010    Dr. Pandya, Versed. small hh, mild prepylori gastritis. path unavailable  . Esophagogastroduodenoscopy 04/2010    Dr. Pandya, Versed. small hh, mild distal esophagitis, antral gastritis and duodenitis. Bx mild chronic gastritis and no H.pylori  . Esophagogastroduodenoscopy 08/2009    Dr. Patel, Versed. Moderate gastritis, moderate duodenitis with nodularity in proximal duodenal bulb, bx chronic gastritis, no helicobacter, mild chronic duodenitis  . Esophagogastroduodenoscopy 02/2007    Dr. Pandya, Versed. antral gastritis and duodenal ulcers. Bx mild chronic gastritis. No bx from duodenal ulcer available.    Family History  Problem Relation Age of Onset  . Colon cancer Neg Hx   . Liver cancer Neg Hx   . Breast cancer Neg Hx   . Inflammatory bowel disease Neg Hx     History   Social History  . Marital Status: Single    Spouse Name: N/A    Number of Children: 1  . Years of Education: N/A   Occupational History  . unemployed    Social History Main Topics  . Smoking status: Never Smoker   . Smokeless tobacco: Not on file  . Alcohol Use: No  . Drug Use: No  . Sexually Active:    Other Topics Concern  . Not on file   Social History Narrative  . No narrative on file      ROS:  General: Positive for anorexia, weight loss, weakness, fatigue. Negative for fever, chills. Eyes: Negative for vision changes.  ENT: Negative for hoarseness, difficulty swallowing , nasal  congestion. CV: Negative for chest pain, angina, palpitations, dyspnea on exertion, peripheral edema.  Respiratory: Negative for dyspnea at rest, dyspnea on exertion, cough, sputum, wheezing.  GI: See history of present illness. GU:  Negative for dysuria, hematuria, urinary incontinence, urinary frequency, nocturnal urination.  MS: Positive for back pain and knee pain.  Derm: Negative for rash or itching.  Neuro: Negative for weakness, abnormal sensation, seizure, frequent headaches, memory loss, confusion.  Psych: Negative for anxiety, depression, suicidal ideation, hallucinations.  Endo: See HPI.  Heme: Negative for bruising or bleeding. Allergy: Negative for rash or hives.    Physical Examination:  BP 112/77  Pulse 109  Temp(Src) 97.7 F (36.5 C) (Temporal)  Ht 5' 3" (  1.6 m)  Wt 143 lb (64.864 kg)  BMI 25.33 kg/m2   General: Well-nourished, well-developed in no acute distress. Laying on exam bed.  Head: Normocephalic, atraumatic.   Eyes: Conjunctiva pink, no icterus. Mouth: Oropharyngeal mucosa moist and pink , no lesions erythema or exudate. Neck: Supple without thyromegaly, masses, or lymphadenopathy.  Lungs: Clear to auscultation bilaterally.  Heart: Regular rate and rhythm, no murmurs rubs or gallops.  Abdomen: Bowel sounds are normal, moderate epigastric tenderness,  nondistended, no hepatosplenomegaly or masses, no abdominal bruits or    hernia , no rebound or guarding.   Rectal: Not performed. Extremities: No lower extremity edema. No clubbing or deformities.  Neuro: Alert and oriented x 4 , grossly normal neurologically.  Skin: Warm and dry, no rash or jaundice.   Psych: Alert and cooperative, normal mood and affect.  Labs: Lab Results  Component Value Date   WBC 6.4 03/22/2011   HGB 12.0 03/22/2011   HCT 37.5 03/22/2011   MCV 90.4 03/22/2011   PLT 258 03/22/2011   Lab Results  Component Value Date   CREATININE 0.69 03/23/2011   BUN 3* 03/23/2011   NA 141  03/23/2011   K 4.4 03/23/2011   CL 105 03/23/2011   CO2 31 03/23/2011   Lab Results  Component Value Date   ALT 10 03/22/2011   AST 22 03/22/2011   ALKPHOS 101 03/22/2011   BILITOT 0.3 03/22/2011   Lab Results  Component Value Date   LIPASE 20 03/22/2011   Lab Results  Component Value Date   TSH 2.880 03/20/2011     Imaging Studies: Dg Abd 1 View  03/21/2011  *RADIOLOGY REPORT*  Clinical Data: Abdominal pain, vomiting, and constipation  ABDOMEN - 1 VIEW  Comparison: 11/01/2008  Findings: Gas and stool throughout the colon without distension. No small bowel dilatation.  Vascular calcifications.  Surgical clips in the right upper quadrant and right lower quadrant. Degenerative changes in the spine.  Calcified phleboliths. Increased density in the pelvis consistent with filled bladder.  IMPRESSION: Stool filled colon.  Nonobstructive bowel gas pattern.  Original Report Authenticated By: WILLIAM R. STEVENS, M.D.   CT A/P with contrast done at [facility] 03/20/2011: Fatty liver, pancreatic duct mildly prominent, stable T12 compression fracture (CT report under Media in [facility]).  CT A/P with contrast done at [facility] 03/02/11: Fatty liver, minimal wall enhancement of pylorus of stomach as well as second portion of duodenum. Few mildly prominent SB loops of left abdomen.   Abd u/s done at [facility] 01/23/2011: Fatty liver, CBD 8.2mm, s/p cholecystectomy.  CT A/P without CM at [facility] 01/23/2011: Fatty liver.  MRI abd with MRCP at [facility] 11/25/2010: Common hepatic duct mildly dilation, 11mm, tapers distally such that distal CBD measures 5mm. No stones. Pancreas and pancreatic duct normal.   UGI series at [facility] 09/15/2009: Unremarkable.    

## 2011-04-05 NOTE — Progress Notes (Signed)
Faxed to PCP

## 2011-04-06 ENCOUNTER — Ambulatory Visit (HOSPITAL_COMMUNITY): Admitting: Anesthesiology

## 2011-04-06 ENCOUNTER — Other Ambulatory Visit: Payer: Self-pay | Admitting: Gastroenterology

## 2011-04-06 ENCOUNTER — Ambulatory Visit (HOSPITAL_COMMUNITY): Admission: RE | Admit: 2011-04-06 | Source: Ambulatory Visit | Admitting: Gastroenterology

## 2011-04-06 ENCOUNTER — Encounter (HOSPITAL_COMMUNITY)
Admission: RE | Disposition: A | Discharge status: Home / Self Care | Payer: Self-pay | Source: Ambulatory Visit | Attending: Gastroenterology

## 2011-04-06 ENCOUNTER — Encounter (HOSPITAL_COMMUNITY)
Admission: RE | Admit: 2011-04-06 | Discharge: 2011-04-06 | Disposition: A | Discharge status: Home / Self Care | Source: Ambulatory Visit | Attending: Gastroenterology | Admitting: Gastroenterology

## 2011-04-06 ENCOUNTER — Encounter (HOSPITAL_COMMUNITY): Payer: Self-pay | Admitting: Anesthesiology

## 2011-04-06 ENCOUNTER — Encounter (HOSPITAL_COMMUNITY): Admission: RE | Payer: Self-pay | Source: Ambulatory Visit

## 2011-04-06 ENCOUNTER — Encounter (HOSPITAL_COMMUNITY): Payer: Self-pay | Admitting: *Deleted

## 2011-04-06 ENCOUNTER — Ambulatory Visit (HOSPITAL_COMMUNITY)
Admission: RE | Admit: 2011-04-06 | Discharge: 2011-04-06 | Disposition: A | Discharge status: Home / Self Care | Source: Ambulatory Visit | Attending: Gastroenterology | Admitting: Gastroenterology

## 2011-04-06 DIAGNOSIS — K2940 Chronic atrophic gastritis without bleeding: Secondary | ICD-10-CM | POA: Insufficient documentation

## 2011-04-06 DIAGNOSIS — K269 Duodenal ulcer, unspecified as acute or chronic, without hemorrhage or perforation: Secondary | ICD-10-CM

## 2011-04-06 DIAGNOSIS — K315 Obstruction of duodenum: Secondary | ICD-10-CM

## 2011-04-06 DIAGNOSIS — K2990 Gastroduodenitis, unspecified, without bleeding: Secondary | ICD-10-CM

## 2011-04-06 DIAGNOSIS — R1310 Dysphagia, unspecified: Secondary | ICD-10-CM

## 2011-04-06 DIAGNOSIS — R1013 Epigastric pain: Secondary | ICD-10-CM | POA: Insufficient documentation

## 2011-04-06 DIAGNOSIS — R634 Abnormal weight loss: Secondary | ICD-10-CM | POA: Insufficient documentation

## 2011-04-06 DIAGNOSIS — R112 Nausea with vomiting, unspecified: Secondary | ICD-10-CM | POA: Insufficient documentation

## 2011-04-06 DIAGNOSIS — K2970 Gastritis, unspecified, without bleeding: Secondary | ICD-10-CM

## 2011-04-06 DIAGNOSIS — Z7982 Long term (current) use of aspirin: Secondary | ICD-10-CM | POA: Insufficient documentation

## 2011-04-06 DIAGNOSIS — Z01812 Encounter for preprocedural laboratory examination: Secondary | ICD-10-CM | POA: Insufficient documentation

## 2011-04-06 HISTORY — PX: SAVORY DILATION: SHX5439

## 2011-04-06 HISTORY — PX: ESOPHAGOGASTRODUODENOSCOPY: SHX1529

## 2011-04-06 LAB — POCT I-STAT 4, (NA,K, GLUC, HGB,HCT): Hemoglobin: 13.3 g/dL (ref 12.0–15.0)

## 2011-04-06 SURGERY — EGD (ESOPHAGOGASTRODUODENOSCOPY)
Anesthesia: Moderate Sedation

## 2011-04-06 SURGERY — ESOPHAGOGASTRODUODENOSCOPY (EGD) WITH PROPOFOL
Anesthesia: Monitor Anesthesia Care | Site: Mouth

## 2011-04-06 MED ORDER — FENTANYL CITRATE 0.05 MG/ML IJ SOLN: 25.0000 ug | INTRAMUSCULAR | Status: DC | PRN

## 2011-04-06 MED ORDER — MIDAZOLAM HCL 2 MG/2ML IJ SOLN
INTRAMUSCULAR | Status: AC
  Filled 2011-04-06: qty 2

## 2011-04-06 MED ORDER — BUTAMBEN-TETRACAINE-BENZOCAINE 2-2-14 % EX AERO
1.0000 | INHALATION_SPRAY | Freq: Once | CUTANEOUS | Status: AC
  Administered 2011-04-06: 1 via TOPICAL
  Filled 2011-04-06: qty 56

## 2011-04-06 MED ORDER — FENTANYL CITRATE 0.05 MG/ML IJ SOLN
INTRAMUSCULAR | Status: DC | PRN
  Administered 2011-04-06 (×2): 50 ug via INTRAVENOUS

## 2011-04-06 MED ORDER — MIDAZOLAM HCL 2 MG/2ML IJ SOLN
1.0000 mg | INTRAMUSCULAR | Status: AC | PRN
  Administered 2011-04-06 (×3): 2 mg via INTRAVENOUS

## 2011-04-06 MED ORDER — DEXAMETHASONE SODIUM PHOSPHATE 4 MG/ML IJ SOLN
4.0000 mg | Freq: Once | INTRAMUSCULAR | Status: AC
  Administered 2011-04-06: 4 mg via INTRAVENOUS

## 2011-04-06 MED ORDER — PROPOFOL 10 MG/ML IV EMUL
INTRAVENOUS | Status: DC | PRN
  Administered 2011-04-06: 100 ug/kg/min via INTRAVENOUS

## 2011-04-06 MED ORDER — DEXAMETHASONE SODIUM PHOSPHATE 4 MG/ML IJ SOLN
INTRAMUSCULAR | Status: AC
  Filled 2011-04-06: qty 1

## 2011-04-06 MED ORDER — PROPOFOL 10 MG/ML IV EMUL
INTRAVENOUS | Status: AC
  Filled 2011-04-06: qty 20

## 2011-04-06 MED ORDER — LIDOCAINE HCL (CARDIAC) 10 MG/ML IV SOLN
INTRAVENOUS | Status: DC | PRN
  Administered 2011-04-06: 10 mg via INTRAVENOUS

## 2011-04-06 MED ORDER — ONDANSETRON HCL 4 MG/2ML IJ SOLN: 4.0000 mg | Freq: Once | INTRAMUSCULAR | Status: DC | PRN

## 2011-04-06 MED ORDER — ONDANSETRON HCL 4 MG/2ML IJ SOLN
4.0000 mg | Freq: Once | INTRAMUSCULAR | Status: AC
Start: 2011-04-06 — End: 2011-04-06
  Administered 2011-04-06: 4 mg via INTRAVENOUS

## 2011-04-06 MED ORDER — LINACLOTIDE 145 MCG PO CAPS: 1.0000 | ORAL_CAPSULE | Freq: Every day | ORAL | Status: DC

## 2011-04-06 MED ORDER — LACTATED RINGERS IV SOLN
INTRAVENOUS | Status: DC
Start: 2011-04-06 — End: 2011-04-06
  Administered 2011-04-06: 10:00:00 via INTRAVENOUS

## 2011-04-06 MED ORDER — ONDANSETRON HCL 4 MG/2ML IJ SOLN
INTRAMUSCULAR | Status: AC
  Filled 2011-04-06: qty 2

## 2011-04-06 MED ORDER — BUTAMBEN-TETRACAINE-BENZOCAINE 2-2-14 % EX AERO: 2.0000 | INHALATION_SPRAY | Freq: Once | CUTANEOUS | Status: DC

## 2011-04-06 MED ORDER — STERILE WATER FOR IRRIGATION IR SOLN
Status: DC | PRN
  Administered 2011-04-06: 1000 mL

## 2011-04-06 MED ORDER — LIDOCAINE HCL (PF) 1 % IJ SOLN
INTRAMUSCULAR | Status: AC
  Filled 2011-04-06: qty 5

## 2011-04-06 MED ORDER — STERILE WATER FOR IRRIGATION IR SOLN
Status: DC | PRN
  Administered 2011-04-06: 11:00:00

## 2011-04-06 MED ORDER — FENTANYL CITRATE 0.05 MG/ML IJ SOLN
INTRAMUSCULAR | Status: AC
  Filled 2011-04-06: qty 2

## 2011-04-06 MED ORDER — MINERAL OIL PO OIL
TOPICAL_OIL | ORAL | Status: AC
  Filled 2011-04-06: qty 30

## 2011-04-06 SURGICAL SUPPLY — 17 items
BLOCK BITE 60FR ADLT L/F BLUE (MISCELLANEOUS) ×3 IMPLANT
CRE WIREGUIDE ×3 IMPLANT
ELECT REM PT RETURN 9FT ADLT (ELECTROSURGICAL)
ELECTRODE REM PT RTRN 9FT ADLT (ELECTROSURGICAL) IMPLANT
FLOOR PAD 36X40 (MISCELLANEOUS) ×3
FORCEP RJ3 GP 1.8X160 W-NEEDLE (CUTTING FORCEPS) IMPLANT
FORCEPS BIOP RAD 4 LRG CAP 4 (CUTTING FORCEPS) ×3 IMPLANT
NEEDLE SCLEROTHERAPY 25GX240 (NEEDLE) IMPLANT
PAD FLOOR 36X40 (MISCELLANEOUS) ×2 IMPLANT
PROBE APC STR FIRE (PROBE) IMPLANT
PROBE INJECTION GOLD (MISCELLANEOUS)
PROBE INJECTION GOLD 7FR (MISCELLANEOUS) IMPLANT
SNARE SHORT THROW 13M SML OVAL (MISCELLANEOUS) IMPLANT
SYR 50ML LL SCALE MARK (SYRINGE) ×3 IMPLANT
TUBING ENDO SMARTCAP PENTAX (MISCELLANEOUS) ×6 IMPLANT
TUBING IRRIGATION ENDOGATOR (MISCELLANEOUS) ×3 IMPLANT
WATER STERILE IRR 1000ML POUR (IV SOLUTION) ×6 IMPLANT

## 2011-04-06 NOTE — H&P (View-Only) (Signed)
Primary Care Physician:  Anson Oregon, DO  Primary Gastroenterologist:  Jonette Eva, MD   Chief Complaint  Patient presents with  . Pancreatitis    HX of  . Nausea  . Emesis    HPI:  Jordan Haney is a 56 y.o. female here for further evaluation of chronic abdominal pain with vomiting. She presents today stating that she's had vomiting of bilious material as well as hematemesis, epigastric pain off and on for over 1-1/2 years. She states her symptoms have been progressively worse over the last several months. Vomiting bilious material and cannot have BM. C/O point tenderness just right of epigastrium. C/O intractable vomiting. Only eating liquid diet but still with vomiting. C/O 27 pound weight loss in three months. C/O gross hematemesis and coffee ground emesis. Stools are white. No melena, brbpr. No heartburn, dysphagia, odynophagia. C/O chronic pain inbetween scapula since fell one year ago. H/O T12 compression fracture.   In hospital at [facility] for n/v, constipation/obstipation about two weeks ago. Had NG tube. Abd xray showed large stool load. Patient says she had fresh blood pumped from her stomach. She did not have GI consult.   She is not exactly forthcoming with her previous workup and her history continuously changes upon questioning. I asked her when the last time she saw her gastroenterologist, Dr. Samuella Cota and she told me 3 years ago. Later she told me he did an EGD on her 6 months ago. She states she is not going back to see him. She states he just wants to send her to pain management. She gives h/o PUD due to [facility] powders. States last EGD 6 months ago ok and she started taking BC powders once daily. She tells me she does not take pain medication regularly. Only takes lortab for severe pain related to RA. May not use for months. Not taking oxycodone at this time. I received records from [facility] regarding 01/2011 hospitalization. UDS positive for Methadone. I  asked any h/o methadone and she told me she had been given short term course with a knee surgery seven years ago. I asked specifically about recent use and she finally admitted, but states it was left over pill from 7 years ago. Then told me she takes Ativan as well which was not on her medication list.   She released from [facility] she has been taking Miralax 17g daily. BM once every five days. C/O pp abdominal swelling. No prior colonoscopy. Still vomiting on daily basis.   Per patient, biliary pancreatitis four years. ERCP. Another bout of pancreatitis 3 months later.    Current Outpatient Prescriptions  Medication Sig Dispense Refill  . adalimumab (HUMIRA) 40 MG/0.8ML injection Inject 40 mg into the skin every 14 (fourteen) days.        Marland Kitchen aspirin EC 325 MG tablet Take 650 mg by mouth daily.        . Aspirin-Caffeine 845-65 MG PACK Take 1 packet by mouth daily.      Marland Kitchen esomeprazole (NEXIUM) 40 MG capsule Take 40 mg by mouth daily before breakfast.        . HYDROcodone-acetaminophen (LORTAB 7.5) 7.5-500 MG per tablet Take 1 tablet by mouth every 6 (six) hours as needed.      Marland Kitchen LORazepam (ATIVAN) 0.5 MG tablet Take 0.5 mg by mouth 2 (two) times daily.      . nitroGLYCERIN (NITROSTAT) 0.4 MG SL tablet Place 0.4 mg under the tongue every 5 (five) minutes as needed. Chest pain       .  oxyCODONE-acetaminophen (PERCOCET) 5-325 MG per tablet Take 1 tablet by mouth every 4 (four) hours as needed. pain  30 tablet  0  . polyethylene glycol (MIRALAX / GLYCOLAX) packet Take 17 g by mouth 2 (two) times daily.  14 each    . senna (SENOKOT) 8.6 MG tablet Take 1 tablet (8.6 mg total) by mouth daily. FOR CONSTIPATION.        Allergies as of 04/05/2011 - Review Complete 04/05/2011  Allergen Reaction Noted  . Compazine Shortness Of Breath 03/20/2011  . Phenergan Shortness Of Breath 03/20/2011  . Vistaril (hyzine) Shortness Of Breath 03/20/2011    Past Medical History  Diagnosis Date  . RA (rheumatoid arthritis)   2008  . MI (myocardial infarction) 2008  . Constipation 03/21/2011  . Hypokalemia 03/21/2011  . Fatty liver 03/21/2011  . Chronic pancreatitis     ?per patient she denies diagnosis  . T12 compression fracture 2011  . Duodenal ulcer     remote per patient. +BC powders, patient report negative EGD six months ago    Past Surgical History  Procedure Date  . Cholecystectomy   . Knee surgery   . Appendectomy   . Complete hysterectomy   . Ventral wall hernia   . Esophagogastroduodenoscopy 08/2010    Dr. Samuella Cota, Versed. small hh, mild prepylori gastritis. path unavailable  . Esophagogastroduodenoscopy 04/2010    Dr. Samuella Cota, Versed. small hh, mild distal esophagitis, antral gastritis and duodenitis. Bx mild chronic gastritis and no H.pylori  . Esophagogastroduodenoscopy 08/2009    Dr. Allena Katz, Versed. Moderate gastritis, moderate duodenitis with nodularity in proximal duodenal bulb, bx chronic gastritis, no helicobacter, mild chronic duodenitis  . Esophagogastroduodenoscopy 02/2007    Dr. Samuella Cota, Versed. antral gastritis and duodenal ulcers. Bx mild chronic gastritis. No bx from duodenal ulcer available.    Family History  Problem Relation Age of Onset  . Colon cancer Neg Hx   . Liver cancer Neg Hx   . Breast cancer Neg Hx   . Inflammatory bowel disease Neg Hx     History   Social History  . Marital Status: Single    Spouse Name: N/A    Number of Children: 1  . Years of Education: N/A   Occupational History  . unemployed    Social History Main Topics  . Smoking status: Never Smoker   . Smokeless tobacco: Not on file  . Alcohol Use: No  . Drug Use: No  . Sexually Active:    Other Topics Concern  . Not on file   Social History Narrative  . No narrative on file      ROS:  General: Positive for anorexia, weight loss, weakness, fatigue. Negative for fever, chills. Eyes: Negative for vision changes.  ENT: Negative for hoarseness, difficulty swallowing , nasal  congestion. CV: Negative for chest pain, angina, palpitations, dyspnea on exertion, peripheral edema.  Respiratory: Negative for dyspnea at rest, dyspnea on exertion, cough, sputum, wheezing.  GI: See history of present illness. GU:  Negative for dysuria, hematuria, urinary incontinence, urinary frequency, nocturnal urination.  MS: Positive for back pain and knee pain.  Derm: Negative for rash or itching.  Neuro: Negative for weakness, abnormal sensation, seizure, frequent headaches, memory loss, confusion.  Psych: Negative for anxiety, depression, suicidal ideation, hallucinations.  Endo: See HPI.  Heme: Negative for bruising or bleeding. Allergy: Negative for rash or hives.    Physical Examination:  BP 112/77  Pulse 109  Temp(Src) 97.7 F (36.5 C) (Temporal)  Ht 5\' 3"  (  1.6 m)  Wt 143 lb (64.864 kg)  BMI 25.33 kg/m2   General: Well-nourished, well-developed in no acute distress. Laying on exam bed.  Head: Normocephalic, atraumatic.   Eyes: Conjunctiva pink, no icterus. Mouth: Oropharyngeal mucosa moist and pink , no lesions erythema or exudate. Neck: Supple without thyromegaly, masses, or lymphadenopathy.  Lungs: Clear to auscultation bilaterally.  Heart: Regular rate and rhythm, no murmurs rubs or gallops.  Abdomen: Bowel sounds are normal, moderate epigastric tenderness,  nondistended, no hepatosplenomegaly or masses, no abdominal bruits or    hernia , no rebound or guarding.   Rectal: Not performed. Extremities: No lower extremity edema. No clubbing or deformities.  Neuro: Alert and oriented x 4 , grossly normal neurologically.  Skin: Warm and dry, no rash or jaundice.   Psych: Alert and cooperative, normal mood and affect.  Labs: Lab Results  Component Value Date   WBC 6.4 03/22/2011   HGB 12.0 03/22/2011   HCT 37.5 03/22/2011   MCV 90.4 03/22/2011   PLT 258 03/22/2011   Lab Results  Component Value Date   CREATININE 0.69 03/23/2011   BUN 3* 03/23/2011   NA 141  03/23/2011   K 4.4 03/23/2011   CL 105 03/23/2011   CO2 31 03/23/2011   Lab Results  Component Value Date   ALT 10 03/22/2011   AST 22 03/22/2011   ALKPHOS 101 03/22/2011   BILITOT 0.3 03/22/2011   Lab Results  Component Value Date   LIPASE 20 03/22/2011   Lab Results  Component Value Date   TSH 2.880 03/20/2011     Imaging Studies: Dg Abd 1 View  03/21/2011  *RADIOLOGY REPORT*  Clinical Data: Abdominal pain, vomiting, and constipation  ABDOMEN - 1 VIEW  Comparison: 11/01/2008  Findings: Gas and stool throughout the colon without distension. No small bowel dilatation.  Vascular calcifications.  Surgical clips in the right upper quadrant and right lower quadrant. Degenerative changes in the spine.  Calcified phleboliths. Increased density in the pelvis consistent with filled bladder.  IMPRESSION: Stool filled colon.  Nonobstructive bowel gas pattern.  Original Report Authenticated By: Marlon Pel, M.D.   CT A/P with contrast done at [facility] 03/20/2011: Fatty liver, pancreatic duct mildly prominent, stable T12 compression fracture (CT report under Media in EPIC).  CT A/P with contrast done at [facility] 03/02/11: Fatty liver, minimal wall enhancement of pylorus of stomach as well as second portion of duodenum. Few mildly prominent SB loops of left abdomen.   Abd u/s done at [facility] 01/23/2011: Fatty liver, CBD 8.28mm, s/p cholecystectomy.  CT A/P without CM at [facility] 01/23/2011: Fatty liver.  MRI abd with MRCP at [facility] 11/25/2010: Common hepatic duct mildly dilation, 11mm, tapers distally such that distal CBD measures 5mm. No stones. Pancreas and pancreatic duct normal.   UGI series at [facility] 09/15/2009: Unremarkable.

## 2011-04-06 NOTE — Anesthesia Postprocedure Evaluation (Signed)
Anesthesia Post Note  Patient: Jordan Haney  Procedure(s) Performed:  ESOPHAGOGASTRODUODENOSCOPY (EGD) WITH PROPOFOL - small bowel dialation, esophageal dialation 16mm  Anesthesia type: MAC  Patient location: PACU  Post pain: Pain level controlled  Post assessment: Post-op Vital signs reviewed, Patient's Cardiovascular Status Stable, Respiratory Function Stable, Patent Airway, No signs of Nausea or vomiting and Pain level controlled  Last Vitals:  Filed Vitals:   04/06/11 1144  BP: 128/89  Pulse: 80  Temp: 36.9 C  Resp: 16    Post vital signs: Reviewed and stable  Level of consciousness: awake and alert   Complications: No apparent anesthesia complications

## 2011-04-06 NOTE — Anesthesia Preprocedure Evaluation (Signed)
Anesthesia Evaluation  Patient identified by MRN, date of birth, ID band Patient awake    Reviewed: Allergy & Precautions, H&P , NPO status , Patient's Chart, lab work & pertinent test results  History of Anesthesia Complications Negative for: history of anesthetic complications  Airway Mallampati: I      Dental  (+) Teeth Intact   Pulmonary neg pulmonary ROS,  clear to auscultation        Cardiovascular - angina+ CAD and + Past MI Regular Normal    Neuro/Psych    GI/Hepatic PUD (severe N/V recently),   Endo/Other    Renal/GU      Musculoskeletal  (+) Arthritis -, Rheumatoid disorders,    Abdominal   Peds  Hematology   Anesthesia Other Findings   Reproductive/Obstetrics                           Anesthesia Physical Anesthesia Plan  ASA: III  Anesthesia Plan: MAC   Post-op Pain Management:    Induction: Intravenous  Airway Management Planned: Nasal Cannula  Additional Equipment:   Intra-op Plan:   Post-operative Plan:   Informed Consent: I have reviewed the patients History and Physical, chart, labs and discussed the procedure including the risks, benefits and alternatives for the proposed anesthesia with the patient or authorized representative who has indicated his/her understanding and acceptance.     Plan Discussed with:   Anesthesia Plan Comments:         Anesthesia Quick Evaluation

## 2011-04-06 NOTE — Anesthesia Procedure Notes (Signed)
Procedure Name: MAC Date/Time: 04/06/2011 11:05 AM Performed by: Minerva Areola Pre-anesthesia Checklist: Patient identified, Patient being monitored, Emergency Drugs available, Timeout performed and Suction available Patient Re-evaluated:Patient Re-evaluated prior to inductionOxygen Delivery Method: Simple face mask

## 2011-04-06 NOTE — Transfer of Care (Signed)
Immediate Anesthesia Transfer of Care Note  Patient: Jordan Haney  Procedure(s) Performed:  ESOPHAGOGASTRODUODENOSCOPY (EGD) WITH PROPOFOL - small bowel dialation, esophageal dialation 16mm  Patient Location: PACU  Anesthesia Type: MAC  Level of Consciousness: awake  Airway & Oxygen Therapy: Patient Spontanous Breathing. Nasal cannula  Post-op Assessment: Report given to PACU RN, Post -op Vital signs reviewed and stable and Patient moving all extremities  Post vital signs: Reviewed and stable  Complications: No apparent anesthesia complications

## 2011-04-06 NOTE — Interval H&P Note (Signed)
History and Physical Interval Note:  04/06/2011 10:55 AM  Jordan Haney  has presented today for surgery, with the diagnosis of nausea/vomiting  The various methods of treatment have been discussed with the patient and family. After consideration of risks, benefits and other options for treatment, the patient has consented to  Procedure(s): ESOPHAGOGASTRODUODENOSCOPY (EGD) WITH PROPOFOL as a surgical intervention .  The patients' history has been reviewed, patient examined, no change in status, stable for surgery.  I have reviewed the patients' chart and labs.  Questions were answered to the patient's satisfaction.     [employer]

## 2011-04-08 ENCOUNTER — Encounter (HOSPITAL_COMMUNITY): Payer: Self-pay | Admitting: Gastroenterology

## 2011-04-08 ENCOUNTER — Telehealth: Payer: Self-pay | Admitting: Gastroenterology

## 2011-04-08 NOTE — Telephone Encounter (Signed)
Please call pt. Jordan Haney stomach Bx shows mild gastritis. Continue NEXIUM 30 MINUTES PRIOR TO YOUR FIRST MEAL.  STOP ASPIRIN PRODUCTS FOR THE NEXT 3 MOS. SHE NEEDS A CT OF Jordan Haney ABDOMEN in 2 weeks. FOLLOW A SOFT MECHANICAL DIET-MEATS CHOPPED OR GROUND ONLY. BEGIN LINZESS 145 MCG DAILY FOR CONSTIPATION. OPV IN  1 MO E 30.

## 2011-04-12 NOTE — Telephone Encounter (Signed)
Called. VM not set up.Mailed letter to call for plan of care.

## 2011-04-13 NOTE — Telephone Encounter (Signed)
Pt is aware of OV on 2/14 at 2 with SF in E30 visit

## 2011-04-13 NOTE — Telephone Encounter (Signed)
Pt called and was informed.  

## 2011-04-14 ENCOUNTER — Other Ambulatory Visit: Payer: Self-pay | Admitting: Gastroenterology

## 2011-04-14 DIAGNOSIS — K315 Obstruction of duodenum: Secondary | ICD-10-CM

## 2011-04-14 NOTE — Telephone Encounter (Signed)
Pt is scheduled for CT on 01/28 @ 1:45- She is aware that she will need to go by radiology before then and pick up her contrast.

## 2011-04-18 NOTE — Op Note (Signed)
[facility] [address] [place], [place]  [address]  ENDOSCOPY PROCEDURE REPORT  PATIENT:  Jordan, Haney  MR#:  [id number] BIRTHDATE:  1955-05-01, 55 yrs. old  GENDER:  female  ENDOSCOPIST:  Jonette Eva, MD ASSISTANT: Referred by:  Resa Miner, DO  PROCEDURE DATE:  04/06/2011 PROCEDURE:  EGD with balloon dilatation, EGD with biopsy, EGD with dilatation over guidewire ASA CLASS: INDICATIONS:  DYSPHAGIA, NAUSEA, VOMITING & ABDOMINAL PAIN FOR 2 YEARS  MEDICATIONS:   MAC sedation, administered by CRNA TOPICAL ANESTHETIC:  Cetacaine Spray  DESCRIPTION OF PROCEDURE:   After the risks benefits and alternatives of the procedure were thoroughly explained, informed consent was obtained.  The  endoscope was introduced through the mouth and advanced to the second portion of the duodenum.  The instrument was slowly withdrawn as the mucosa was carefully examined.  Prior to withdrawal of the scope, the guidwire was placed.  The esophagus was dilated successfully.  The patient was recovered in endoscopy and discharged home in satisfactory condition. <<PROCEDUREIMAGES>> A 1-2 CM hiatal hernia was found.  Moderate gastritis was found BIOPSID VIA COLD FORCEPS.  An 1 MM  x 3 MM LINEAR ulcer was found in the bulb of the duodenum.  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 ABDLE T PASS THE SCOPE MORE EASILY AFTER DILATION COMPLETE.    Dilation was then performed at the total esophagus UE TO COMPLAINT OF DYSPHAGIA.  NO BARRETT'S. NL SECONDPORTIONFO THE DUODENUM.  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 CT ABD IN 2 WEEKS SOFT MECHANICAL DIET OPV IN 1 MOS AWAIT BIOPSIES BEGIN LINZESS FOR CONSTIPATION  REPEAT EXAM:  No  ______________________________ Jonette Eva, MD  CC:  n. eSIGNED:   Sandi Fields at 04/18/2011 03:55 PM  Roger Kill, [id number]

## 2011-04-19 ENCOUNTER — Ambulatory Visit (HOSPITAL_COMMUNITY)

## 2011-05-03 ENCOUNTER — Ambulatory Visit (HOSPITAL_COMMUNITY)

## 2011-05-06 ENCOUNTER — Ambulatory Visit: Admitting: Gastroenterology

## 2011-05-10 ENCOUNTER — Ambulatory Visit (HOSPITAL_COMMUNITY)

## 2011-05-10 ENCOUNTER — Emergency Department (HOSPITAL_COMMUNITY)

## 2011-05-10 ENCOUNTER — Telehealth: Payer: Self-pay | Admitting: Gastroenterology

## 2011-05-10 ENCOUNTER — Inpatient Hospital Stay (HOSPITAL_COMMUNITY)
Admission: EM | Admit: 2011-05-10 | Discharge: 2011-05-20 | DRG: 381 | Disposition: A | Discharge status: Home / Self Care | Attending: Internal Medicine | Admitting: Internal Medicine

## 2011-05-10 ENCOUNTER — Other Ambulatory Visit: Payer: Self-pay

## 2011-05-10 ENCOUNTER — Encounter (HOSPITAL_COMMUNITY): Payer: Self-pay | Admitting: Emergency Medicine

## 2011-05-10 DIAGNOSIS — R1110 Vomiting, unspecified: Secondary | ICD-10-CM

## 2011-05-10 DIAGNOSIS — K920 Hematemesis: Secondary | ICD-10-CM

## 2011-05-10 DIAGNOSIS — E876 Hypokalemia: Secondary | ICD-10-CM | POA: Diagnosis present

## 2011-05-10 DIAGNOSIS — N390 Urinary tract infection, site not specified: Secondary | ICD-10-CM | POA: Diagnosis present

## 2011-05-10 DIAGNOSIS — K7689 Other specified diseases of liver: Secondary | ICD-10-CM | POA: Diagnosis present

## 2011-05-10 DIAGNOSIS — R7401 Elevation of levels of liver transaminase levels: Secondary | ICD-10-CM | POA: Diagnosis present

## 2011-05-10 DIAGNOSIS — Z8719 Personal history of other diseases of the digestive system: Secondary | ICD-10-CM

## 2011-05-10 DIAGNOSIS — R112 Nausea with vomiting, unspecified: Secondary | ICD-10-CM | POA: Diagnosis present

## 2011-05-10 DIAGNOSIS — K311 Adult hypertrophic pyloric stenosis: Principal | ICD-10-CM | POA: Diagnosis present

## 2011-05-10 DIAGNOSIS — K279 Peptic ulcer, site unspecified, unspecified as acute or chronic, without hemorrhage or perforation: Secondary | ICD-10-CM

## 2011-05-10 DIAGNOSIS — K760 Fatty (change of) liver, not elsewhere classified: Secondary | ICD-10-CM

## 2011-05-10 DIAGNOSIS — R1013 Epigastric pain: Secondary | ICD-10-CM

## 2011-05-10 DIAGNOSIS — I252 Old myocardial infarction: Secondary | ICD-10-CM

## 2011-05-10 DIAGNOSIS — IMO0001 Reserved for inherently not codable concepts without codable children: Secondary | ICD-10-CM

## 2011-05-10 DIAGNOSIS — R109 Unspecified abdominal pain: Secondary | ICD-10-CM | POA: Diagnosis present

## 2011-05-10 DIAGNOSIS — K315 Obstruction of duodenum: Secondary | ICD-10-CM | POA: Diagnosis present

## 2011-05-10 DIAGNOSIS — M069 Rheumatoid arthritis, unspecified: Secondary | ICD-10-CM | POA: Diagnosis present

## 2011-05-10 DIAGNOSIS — R7402 Elevation of levels of lactic acid dehydrogenase (LDH): Secondary | ICD-10-CM | POA: Diagnosis present

## 2011-05-10 DIAGNOSIS — A498 Other bacterial infections of unspecified site: Secondary | ICD-10-CM | POA: Diagnosis present

## 2011-05-10 DIAGNOSIS — R634 Abnormal weight loss: Secondary | ICD-10-CM

## 2011-05-10 DIAGNOSIS — K5900 Constipation, unspecified: Secondary | ICD-10-CM | POA: Diagnosis present

## 2011-05-10 HISTORY — DX: Acute pancreatitis without necrosis or infection, unspecified: K85.90

## 2011-05-10 LAB — CBC
Hemoglobin: 13.2 g/dL (ref 12.0–15.0)
MCH: 28.2 pg (ref 26.0–34.0)
MCV: 86.5 fL (ref 78.0–100.0)
RBC: 4.68 MIL/uL (ref 3.87–5.11)

## 2011-05-10 LAB — COMPREHENSIVE METABOLIC PANEL
Alkaline Phosphatase: 130 U/L — ABNORMAL HIGH (ref 39–117)
BUN: 11 mg/dL (ref 6–23)
Calcium: 10.3 mg/dL (ref 8.4–10.5)
GFR calc Af Amer: >90 mL/min (ref >90)
GFR calc non Af Amer: >90 mL/min (ref >90)
Glucose, Bld: 101 mg/dL — ABNORMAL HIGH (ref 70–99)
Potassium: 3.8 mEq/L (ref 3.5–5.1)
Total Protein: 8 g/dL (ref 6.0–8.3)

## 2011-05-10 LAB — URINALYSIS, ROUTINE W REFLEX MICROSCOPIC
Bilirubin Urine: NEGATIVE
Ketones, ur: 15 mg/dL — AB
Nitrite: NEGATIVE
Protein, ur: NEGATIVE mg/dL
Specific Gravity, Urine: <1.005 — ABNORMAL LOW (ref 1.005–1.030)
Urobilinogen, UA: 0.2 mg/dL (ref 0.0–1.0)

## 2011-05-10 LAB — LIPASE, BLOOD: Lipase: 17 U/L (ref 11–59)

## 2011-05-10 LAB — DIFFERENTIAL
Eosinophils Absolute: 0 10*3/uL (ref 0.0–0.7)
Eosinophils Relative: 1 % (ref 0–5)
Lymphs Abs: 1.8 10*3/uL (ref 0.7–4.0)
Monocytes Relative: 6 % (ref 3–12)

## 2011-05-10 MED ORDER — ONDANSETRON HCL 4 MG/2ML IJ SOLN
4.0000 mg | Freq: Once | INTRAMUSCULAR | Status: AC
  Administered 2011-05-10: 4 mg via INTRAVENOUS
  Filled 2011-05-10: qty 2

## 2011-05-10 MED ORDER — SODIUM CHLORIDE 0.9 % IV SOLN: INTRAVENOUS | Status: DC

## 2011-05-10 MED ORDER — HYDROMORPHONE HCL PF 1 MG/ML IJ SOLN
1.0000 mg | INTRAMUSCULAR | Status: DC | PRN
  Administered 2011-05-10: 1 mg via INTRAVENOUS
  Filled 2011-05-10: qty 1

## 2011-05-10 MED ORDER — HYDROMORPHONE HCL PF 2 MG/ML IJ SOLN
2.0000 mg | Freq: Once | INTRAMUSCULAR | Status: AC
  Administered 2011-05-10: 2 mg via INTRAVENOUS
  Filled 2011-05-10: qty 1

## 2011-05-10 MED ORDER — SODIUM CHLORIDE 0.9 % IV SOLN
INTRAVENOUS | Status: DC
  Administered 2011-05-10 (×2): via INTRAVENOUS

## 2011-05-10 MED ORDER — ONDANSETRON HCL 4 MG/2ML IJ SOLN
4.0000 mg | Freq: Three times a day (TID) | INTRAMUSCULAR | Status: DC | PRN
  Administered 2011-05-10: 4 mg via INTRAVENOUS
  Filled 2011-05-10: qty 2

## 2011-05-10 MED ORDER — IOHEXOL 300 MG/ML  SOLN
100.0000 mL | Freq: Once | INTRAMUSCULAR | Status: AC | PRN
  Administered 2011-05-10: 100 mL via INTRAVENOUS

## 2011-05-10 NOTE — Telephone Encounter (Signed)
If pt is having vomiting, and severe abdominal pain she should go to the ED.

## 2011-05-10 NOTE — Telephone Encounter (Signed)
Called and informed pt.  

## 2011-05-10 NOTE — Telephone Encounter (Signed)
Started Friday night vomiting w/abd pain she has had green & some blood mixed and she has an appt for CT this afternoon please advise??

## 2011-05-10 NOTE — Telephone Encounter (Signed)
Pt just called and said that she is scheduled for CT this afternoon, and she is very sick vomiting, and is sure that she will not be able to drink the contrast. Said she had a terrible time Friday with severe abdominal pain and vomiting, but then was better on Sat. But this morning she is very sick again with abdominal pain and vomiting. Please advise!

## 2011-05-10 NOTE — ED Notes (Signed)
States she was recently scoped and dilated and was scheduled to have a ct today. States she was unable to drink the contrast, advised to come to the ED for evaluation

## 2011-05-10 NOTE — ED Notes (Signed)
Patient unable to give urine specimen.

## 2011-05-10 NOTE — ED Notes (Signed)
Pt sent by dr. Darrick Penna for abd with n/v since Friday. Pt states she was scheduled for a ct scan of the abd at 2pm.

## 2011-05-10 NOTE — ED Provider Notes (Signed)
This chart was scribed for Jordan Cooper III, MD by Wallis Mart. The patient was seen in room APA05/[facility]05 and the patient's care was started at 5:20 PM.   CSN: [id number]  Arrival date & time 05/10/11  1255   First MD Initiated Contact with Patient 05/10/11 1712      Chief Complaint  Patient presents with  . Abdominal Pain  . Nausea  . Emesis    (Consider location/radiation/quality/duration/timing/severity/associated sxs/prior treatment) Patient is a 56 y.o. female presenting with abdominal pain and vomiting.  Abdominal Pain The primary symptoms of the illness include abdominal pain and vomiting.  Emesis  Associated symptoms include abdominal pain.   Jordan Haney is a 56 y.o. female with hx of abdominal problems who presents to the Emergency Department complaining of sudden onset, persistence of constant, gradually worsening, moderate to severe abdominal pain onset 3 days ago. Pain radiates to her back.  Pt recalls no precipitating event. Pt c/o associated nausea and vomiting (green in color).  Pt is having regular bms, denies urinary problems.  Pt took nexium and anti-nausea medication w/ no relifef. There are no other associated symptoms and no other alleviating or aggravating factors.  Pt had an esophagogastroduodenoscopy w/ indication of mass behind pancreas a month ago and was scheduled to have a ct of the abdomin today but was unable to drink contrast so advised to come to the ER.   Pt w/ h/o cholecystectomy  and appendectomy, MI, RA, duodenall ulcer, ventral hernia repair, chronic pancreatitis.   Pt is taking humera, vicodin, percocet.     Past Medical History  Diagnosis Date  . RA (rheumatoid arthritis)  2008  . MI (myocardial infarction) 2008  . Constipation 03/21/2011  . Hypokalemia 03/21/2011  . Fatty liver 03/21/2011  . Chronic pancreatitis     ?per patient she denies diagnosis  . T12 compression fracture 2011  . Duodenal ulcer     remote per patient. +BC  powders, patient report negative EGD six months ago    Past Surgical History  Procedure Date  . Cholecystectomy   . Knee surgery   . Appendectomy   . Complete hysterectomy   . Ventral wall hernia   . Esophagogastroduodenoscopy 08/2010    Dr. Samuella Cota, Versed. small hh, mild prepylori gastritis. path unavailable  . Esophagogastroduodenoscopy 04/2010    Dr. Samuella Cota, Versed. small hh, mild distal esophagitis, antral gastritis and duodenitis. Bx mild chronic gastritis and no H.pylori  . Esophagogastroduodenoscopy 08/2009    Dr. Allena Katz, Versed. Moderate gastritis, moderate duodenitis with nodularity in proximal duodenal bulb, bx chronic gastritis, no helicobacter, mild chronic duodenitis  . Esophagogastroduodenoscopy 02/2007    Dr. Samuella Cota, Versed. antral gastritis and duodenal ulcers. Bx mild chronic gastritis. No bx from duodenal ulcer available.  Gaspar Bidding dilation 04/06/2011    Procedure: SAVORY DILATION;  Surgeon: Arlyce Harman, MD;  Location: [facility];  Service: Endoscopy;  Laterality: N/A;  16 mm    Family History  Problem Relation Age of Onset  . Colon cancer Neg Hx   . Liver cancer Neg Hx   . Breast cancer Neg Hx   . Inflammatory bowel disease Neg Hx     History  Substance Use Topics  . Smoking status: Never Smoker   . Smokeless tobacco: Not on file  . Alcohol Use: No    OB History    Grav Para Term Preterm Abortions TAB SAB Ect Mult Living  Review of Systems  Gastrointestinal: Positive for vomiting and abdominal pain.   10 Systems reviewed and are negative for acute change except as noted in the HPI.  Allergies  Compazine; Phenergan; and Vistaril  Home Medications   Current Outpatient Rx  Name Route Sig Dispense Refill  . ADALIMUMAB 40 MG/0.8ML [place] KIT Subcutaneous Inject 40 mg into the skin every 14 (fourteen) days.      Marland Kitchen ESOMEPRAZOLE MAGNESIUM 40 MG PO CPDR Oral Take 40 mg by mouth daily before breakfast.      . HYDROCODONE-ACETAMINOPHEN 7.5-500 MG  PO TABS Oral Take 1 tablet by mouth every 6 (six) hours as needed.    Marland Kitchen LINACLOTIDE 145 MCG PO CAPS Oral Take 1 capsule by mouth daily. 1 PO DAILY 31 capsule 11  . LORAZEPAM 0.5 MG PO TABS Oral Take 0.5 mg by mouth 2 (two) times daily.    Marland Kitchen NITROGLYCERIN 0.4 MG SL SUBL Sublingual Place 0.4 mg under the tongue every 5 (five) minutes as needed. Chest pain     . OXYCODONE-ACETAMINOPHEN 5-325 MG PO TABS Oral Take 1 tablet by mouth every 4 (four) hours as needed. pain 30 tablet 0  . POLYETHYLENE GLYCOL 3350 PO PACK Oral Take 17 g by mouth 2 (two) times daily. 14 each   . SENNOSIDES 8.6 MG PO TABS Oral Take 1 tablet (8.6 mg total) by mouth daily. FOR CONSTIPATION.      BP 155/113  Pulse 103  Temp(Src) 97.9 F (36.6 C) (Oral)  Resp 17  Ht 5\' 4"  (1.626 m)  Wt 130 lb (58.968 kg)  BMI 22.31 kg/m2  SpO2 98%  Physical Exam  Nursing note and vitals reviewed. Constitutional: She is oriented to person, place, and time. She appears well-developed and well-nourished. No distress.  HENT:  Head: Normocephalic and atraumatic.  Eyes: EOM are normal. Pupils are equal, round, and reactive to light.  Neck: Neck supple. No tracheal deviation present.  Cardiovascular: Normal rate, regular rhythm and normal heart sounds.   Pulmonary/Chest: Effort normal and breath sounds normal. No respiratory distress.  Abdominal: Soft. She exhibits no distension and no mass. There is tenderness.       Acute epigastrial tenderness  Musculoskeletal: Normal range of motion. She exhibits no edema.  Neurological: She is alert and oriented to person, place, and time. No sensory deficit.  Skin: Skin is warm and dry.       No jaundice  Psychiatric: She has a normal mood and affect. Her behavior is normal.    ED Course  Procedures (including critical care time) DIAGNOSTIC STUDIES: Oxygen Saturation is 98% on room air, normal by my interpretation.    COORDINATION OF CARE:   Date: 05/10/2011  Rate: 90  Rhythm: normal sinus  rhythm  QRS Axis: normal  Intervals: normal QRS:  Poor R wave progression in the precordial leads suggests possible old anterior myocardial infarction.  ST/T Wave abnormalities: normal  Conduction Disutrbances:none  Narrative Interpretation: Abnormal EKG  Old EKG Reviewed: changes noted--Poor R wave progression is most notable in V3 today, which may be new since 03/20/11, or may be due to lead placement.  7:25 PM Continues to complain of pain.  Repeated her meds for pain and nausea.  9:17 PM: All results reviewed and discussed with pt, questions answered, pt agreeable with plan.   Labs Reviewed  COMPREHENSIVE METABOLIC PANEL - Abnormal; Notable for the following:    Glucose, Bld 101 (*)    AST 68 (*)    ALT 49 (*)  Alkaline Phosphatase 130 (*)    All other components within normal limits  CBC  DIFFERENTIAL  LIPASE, BLOOD  URINALYSIS, ROUTINE W REFLEX MICROSCOPIC  URINE CULTURE   Ct Abdomen Pelvis W Contrast  05/10/2011  *RADIOLOGY REPORT*  Clinical Data: Epigastric abdominal pain, nausea, vomiting, history of chronic pancreatitis  CT ABDOMEN AND PELVIS WITH CONTRAST  Technique:  Multidetector CT imaging of the abdomen and pelvis was performed following the standard protocol during bolus administration of intravenous contrast.  Contrast: OMNIPAQUE IOHEXOL 300 MG/ML IV SOLN  Comparison: CT abdomen pelvis of 03/20/2011  Findings: The lung bases are clear. The lung bases are clear. There is diffuse severe fatty infiltration of the liver.  No ductal dilatation is seen.  Surgical clips are present from prior cholecystectomy.  Dilatation of the pancreatic duct is stable and may be due to the patient's history of chronic pancreatitis.  No present evidence of pancreatitis is seen.  The adrenal glands and spleen are unremarkable.  The stomach is moderately distended with contrast and is unremarkable.  A nodular area within the antrum of the stomach may represent retained food debris or  mucosal thickening but a polypoid lesion cannot be excluded.  The kidneys enhance and there is a stable cyst emanating from the upper pole of the left kidney anterolaterally.  No renal calculi are seen and no hydronephrosis is noted.  The abdominal aorta is normal in caliber.  The urinary bladder is moderately urine distended and unremarkable. The uterus has previously been resected.  No fluid is seen within the pelvis.  A moderate amount of feces is noted throughout the entire colon.  The terminal ileum is unremarkable.  Partial compression deformity of T12 vertebral body is noted which appears old with some bony spurring present.  IMPRESSION:  1.  No acute abnormality is seen on CT of the abdomen and pelvis. 2.  Diffuse severe fatty infiltration of the liver. 3.  Polypoid area in the distal antrum of the stomach probably represents mucosal infolding or food debris. 4.  Moderate amount of feces throughout the entire colon. 5.  Apparent old compression deformity of T12.  Original Report Authenticated By: Juline Patch, M.D.   10:15 PM Case discussed with Dr.; Raj Janus who recommends admission and she will consult on the patient.  10:30 PM Case discussed with Osvaldo Shipper, M.D., who advised that pt could be admitted to a med-surg bed.   1. Abdominal  pain, other specified site   2. Vomiting   3. History of duodenal ulcer     I personally performed the services described in this documentation, which was scribed in my presence. The recorded information has been reviewed and considered.  Osvaldo Human, M.D.     Jordan Cooper III, MD 05/10/11 2232

## 2011-05-10 NOTE — H&P (Signed)
Jordan Haney is an 56 y.o. female.    PCP: Anson [place], DO, DO   Chief Complaint: Nausea, vomiting, and abdominal pain since Friday  HPI: This is a 56 year old, Caucasian female, with past medical history of rheumatoid arthritis, and a history of peptic ulcer disease. She underwent upper endoscopy on January 15 of 2013. 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 This was dilated.  Patient tells me that ever since that EGD she has felt better. She's been able to tolerate her diet. However, on Friday she started having pain in the epigastric area along with nausea and vomiting. The pain is a constant ache in the upper abdomen radiating to the back. It was 10 out of 10 in intensity. No precipitating factors. No aggravating or relieving factors. She tells me, that most of her gastroenterological issues started 3 years ago, when she underwent a cholecystectomy. She denies any fever or chills. Denies any diarrhea. No difficulty passing urine.   Home Medications: Prior to Admission medications   Medication Sig Start Date End Date Taking? Authorizing Provider  adalimumab (HUMIRA) 40 MG/0.8ML injection Inject 40 mg into the skin every 7 (seven) days.    Yes Historical Provider, MD  Aspirin-Salicylamide-Caffeine (BC HEADACHE POWDER PO) Take 1-2 packets by mouth as needed. For pain   Yes Historical Provider, MD  esomeprazole (NEXIUM) 40 MG capsule Take 40 mg by mouth daily before breakfast.     Yes Historical Provider, MD  HYDROcodone-acetaminophen (LORTAB 7.5) 7.5-500 MG per tablet Take 1 tablet by mouth every 6 (six) hours as needed. For pain   Yes Historical Provider, MD  Linaclotide (LINZESS) 145 MCG CAPS Take 1 capsule by mouth daily. 1 PO DAILY 04/06/11  Yes Arlyce Harman, MD  LORazepam (ATIVAN) 0.5 MG tablet Take 0.5 mg by mouth 2 (two) times daily.   Yes Historical Provider, MD  methotrexate (RHEUMATREX) 2.5 MG tablet Take  12.5 mg by mouth once a week. Caution:Chemotherapy. Protect from light.   Yes Historical Provider, MD  polyethylene glycol (MIRALAX / GLYCOLAX) packet Take 17 g by mouth 2 (two) times daily. 04/05/11  Yes Tana Coast, PA  QUEtiapine (SEROQUEL) 100 MG tablet Take 100 mg by mouth 2 (two) times daily.   Yes Historical Provider, MD  senna (SENOKOT) 8.6 MG tablet Take 1 tablet (8.6 mg total) by mouth daily. FOR CONSTIPATION. 03/23/11 03/22/12 Yes Elliot Cousin, MD  nitroGLYCERIN (NITROSTAT) 0.4 MG SL tablet Place 0.4 mg under the tongue every 5 (five) minutes as needed. Chest pain     Historical Provider, MD    Allergies:  Allergies  Allergen Reactions  . Compazine Shortness Of Breath    Tongue swelling  . Phenergan Shortness Of Breath    Tongue swelling  . Vistaril (Hyzine) Shortness Of Breath    Tongue swelling    Past Medical History: Past Medical History  Diagnosis Date  . RA (rheumatoid arthritis)  2008  . MI (myocardial infarction) 2008  . Constipation 03/21/2011  . Hypokalemia 03/21/2011  . Fatty liver 03/21/2011  . Chronic pancreatitis     ?per patient she denies diagnosis  . T12 compression fracture 2011  . Duodenal ulcer     remote per patient. +BC powders, patient report negative EGD six months ago    Past Surgical History  Procedure Date  . Cholecystectomy   . Knee surgery   . Appendectomy   . Complete  hysterectomy   . Ventral wall hernia   . Esophagogastroduodenoscopy 08/2010    Dr. Samuella Cota, Versed. small hh, mild prepylori gastritis. path unavailable  . Esophagogastroduodenoscopy 04/2010    Dr. Samuella Cota, Versed. small hh, mild distal esophagitis, antral gastritis and duodenitis. Bx mild chronic gastritis and no H.pylori  . Esophagogastroduodenoscopy 08/2009    Dr. Allena Katz, Versed. Moderate gastritis, moderate duodenitis with nodularity in proximal duodenal bulb, bx chronic gastritis, no helicobacter, mild chronic duodenitis  . Esophagogastroduodenoscopy 02/2007    Dr.  Samuella Cota, Versed. antral gastritis and duodenal ulcers. Bx mild chronic gastritis. No bx from duodenal ulcer available.  Gaspar Bidding dilation 04/06/2011    Procedure: SAVORY DILATION;  Surgeon: Arlyce Harman, MD;  Location: [facility];  Service: Endoscopy;  Laterality: N/A;  16 mm    Social History:  reports that she has never smoked. She does not have any smokeless tobacco history on file. She reports that she does not drink alcohol or use illicit drugs.  Family History:  Family History  Problem Relation Age of Onset  . Colon cancer Neg Hx   . Liver cancer Neg Hx   . Breast cancer Neg Hx   . Inflammatory bowel disease Neg Hx   . Heart attack Mother     Review of Systems - History obtained from the patient General ROS: negative Psychological ROS: negative Ophthalmic ROS: negative ENT ROS: negative Allergy and Immunology ROS: negative Hematological and Lymphatic ROS: negative Endocrine ROS: negative Respiratory ROS: negative Cardiovascular ROS: negative Gastrointestinal ROS: as in hpi Genito-Urinary ROS: negative Musculoskeletal ROS: negative Neurological ROS: negative Dermatological ROS: negative  Physical Examination Blood pressure 146/88, pulse 77, temperature 98.6 F (37 C), temperature source Oral, resp. rate 20, height 5\' 4"  (1.626 m), weight 58.968 kg (130 lb), SpO2 96.00%.  General appearance: alert, cooperative and no distress Head: Normocephalic, without obvious abnormality, atraumatic Eyes: conjunctivae/corneas clear. PERRL, EOM's intact.  Throat: dry mm Neck: no adenopathy, no carotid bruit, no JVD, supple, symmetrical, trachea midline and thyroid not enlarged, symmetric, no tenderness/mass/nodules Back: symmetric, no curvature. ROM normal. No CVA tenderness. Resp: clear to auscultation bilaterally Cardio: regular rate and rhythm, S1, S2 normal, no murmur, click, rub or gallop GI: Abdomen is soft. There is tenderness in the epigastric area without any rebound, rigidity,  or guarding. Bowel sounds are present. No masses, or organomegaly is appreciated. Extremities: extremities normal, atraumatic, no cyanosis or edema Pulses: 2+ and symmetric Skin: Skin color, texture, turgor normal. No rashes or lesions Lymph nodes: Cervical, supraclavicular, and axillary nodes normal. Neurologic: Grossly normal  Laboratory Data: Results for orders placed during the hospital encounter of 05/10/11 (from the past 48 hour(s))  CBC     Status: Normal   Collection Time   05/10/11  5:40 PM      Component Value Range Comment   WBC 6.7  4.0 - 10.5 (K/uL)    RBC 4.68  3.87 - 5.11 (MIL/uL)    Hemoglobin 13.2  12.0 - 15.0 (g/dL)    HCT 16.1  09.6 - 04.5 (%)    MCV 86.5  78.0 - 100.0 (fL)    MCH 28.2  26.0 - 34.0 (pg)    MCHC 32.6  30.0 - 36.0 (g/dL)    RDW 40.9  81.1 - 91.4 (%)    Platelets 352  150 - 400 (K/uL)   DIFFERENTIAL     Status: Normal   Collection Time   05/10/11  5:40 PM      Component Value Range  Comment   Neutrophils Relative 67  43 - 77 (%)    Neutro Abs 4.5  1.7 - 7.7 (K/uL)    Lymphocytes Relative 26  12 - 46 (%)    Lymphs Abs 1.8  0.7 - 4.0 (K/uL)    Monocytes Relative 6  3 - 12 (%)    Monocytes Absolute 0.4  0.1 - 1.0 (K/uL)    Eosinophils Relative 1  0 - 5 (%)    Eosinophils Absolute 0.0  0.0 - 0.7 (K/uL)    Basophils Relative 1  0 - 1 (%)    Basophils Absolute 0.1  0.0 - 0.1 (K/uL)   COMPREHENSIVE METABOLIC PANEL     Status: Abnormal   Collection Time   05/10/11  5:40 PM      Component Value Range Comment   Sodium 140  135 - 145 (mEq/L)    Potassium 3.8  3.5 - 5.1 (mEq/L)    Chloride 101  96 - 112 (mEq/L)    CO2 27  19 - 32 (mEq/L)    Glucose, Bld 101 (*) 70 - 99 (mg/dL)    BUN 11  6 - 23 (mg/dL)    Creatinine, Ser 7.82  0.50 - 1.10 (mg/dL)    Calcium 95.6  8.4 - 10.5 (mg/dL)    Total Protein 8.0  6.0 - 8.3 (g/dL)    Albumin 4.5  3.5 - 5.2 (g/dL)    AST 68 (*) 0 - 37 (U/L)    ALT 49 (*) 0 - 35 (U/L)    Alkaline Phosphatase 130 (*) 39 - 117  (U/L)    Total Bilirubin 0.3  0.3 - 1.2 (mg/dL)    GFR calc non Af Amer >90  >90 (mL/min)    GFR calc Af Amer >90  >90 (mL/min)   LIPASE, BLOOD     Status: Normal   Collection Time   05/10/11  5:40 PM      Component Value Range Comment   Lipase 17  11 - 59 (U/L)   URINALYSIS, ROUTINE W REFLEX MICROSCOPIC     Status: Abnormal   Collection Time   05/10/11 10:28 PM      Component Value Range Comment   Color, Urine YELLOW  YELLOW     APPearance CLEAR  CLEAR     Specific Gravity, Urine <1.005 (*) 1.005 - 1.030     pH 5.5  5.0 - 8.0     Glucose, UA NEGATIVE  NEGATIVE (mg/dL)    Hgb urine dipstick NEGATIVE  NEGATIVE     Bilirubin Urine NEGATIVE  NEGATIVE     Ketones, ur 15 (*) NEGATIVE (mg/dL)    Protein, ur NEGATIVE  NEGATIVE (mg/dL)    Urobilinogen, UA 0.2  0.0 - 1.0 (mg/dL)    Nitrite NEGATIVE  NEGATIVE     Leukocytes, UA NEGATIVE  NEGATIVE  MICROSCOPIC NOT DONE ON URINES WITH NEGATIVE PROTEIN, BLOOD, LEUKOCYTES, NITRITE, OR GLUCOSE <1000 mg/dL.    Radiology Reports: Ct Abdomen Pelvis W Contrast  05/10/2011  *RADIOLOGY REPORT*  Clinical Data: Epigastric abdominal pain, nausea, vomiting, history of chronic pancreatitis  CT ABDOMEN AND PELVIS WITH CONTRAST  Technique:  Multidetector CT imaging of the abdomen and pelvis was performed following the standard protocol during bolus administration of intravenous contrast.  Contrast: OMNIPAQUE IOHEXOL 300 MG/ML IV SOLN  Comparison: CT abdomen pelvis of 03/20/2011  Findings: The lung bases are clear. The lung bases are clear. There is diffuse severe fatty infiltration of the liver.  No  ductal dilatation is seen.  Surgical clips are present from prior cholecystectomy.  Dilatation of the pancreatic duct is stable and may be due to the patient's history of chronic pancreatitis.  No present evidence of pancreatitis is seen.  The adrenal glands and spleen are unremarkable.  The stomach is moderately distended with contrast and is unremarkable.  A  nodular area within the antrum of the stomach may represent retained food debris or mucosal thickening but a polypoid lesion cannot be excluded.  The kidneys enhance and there is a stable cyst emanating from the upper pole of the left kidney anterolaterally.  No renal calculi are seen and no hydronephrosis is noted.  The abdominal aorta is normal in caliber.  The urinary bladder is moderately urine distended and unremarkable. The uterus has previously been resected.  No fluid is seen within the pelvis.  A moderate amount of feces is noted throughout the entire colon.  The terminal ileum is unremarkable.  Partial compression deformity of T12 vertebral body is noted which appears old with some bony spurring present.  IMPRESSION:  1.  No acute abnormality is seen on CT of the abdomen and pelvis. 2.  Diffuse severe fatty infiltration of the liver. 3.  Polypoid area in the distal antrum of the stomach probably represents mucosal infolding or food debris. 4.  Moderate amount of feces throughout the entire colon. 5.  Apparent old compression deformity of T12.  Original Report Authenticated By: Juline Patch, M.D.    Assessment/Plan  Active Problems:  Abdominal pain  Nausea and vomiting  Rheumatoid arthritis  PUD (peptic ulcer disease)   #1 nausea, vomiting, and abdominal pain: This could be related to peptic ulcer disease. She could again have stricture in the duodenum. No significant abnormalities noted in the CT scan. Patient will be admitted to the hospital. She'll be seen by gastroenterology in the morning and may require another endoscopy. In the meantime, we'll continue with intravenous proton pump inhibitors.  #2 history of peptic ulcer disease. Continue with PPI.  #3 history of Rheumatoid arthritis: stable.  #4 mild transaminitis: Etiology is unclear. There is evidence for fatty infiltration of the liver on the CT. Defer further management to gastroenterology. Recheck levels in AM.  DVT,  prophylaxis will be initiated.  Further management decisions will depend on results of further testing and patient's response to treatment.  [facility]  [facility] Pager [phone]  05/10/2011, 10:50 PM

## 2011-05-11 ENCOUNTER — Encounter (HOSPITAL_COMMUNITY): Payer: Self-pay | Admitting: *Deleted

## 2011-05-11 DIAGNOSIS — R109 Unspecified abdominal pain: Secondary | ICD-10-CM

## 2011-05-11 DIAGNOSIS — R112 Nausea with vomiting, unspecified: Secondary | ICD-10-CM

## 2011-05-11 LAB — COMPREHENSIVE METABOLIC PANEL
ALT: 42 U/L — ABNORMAL HIGH (ref 0–35)
Alkaline Phosphatase: 112 U/L (ref 39–117)
BUN: 7 mg/dL (ref 6–23)
CO2: 26 mEq/L (ref 19–32)
GFR calc Af Amer: >90 mL/min (ref >90)
GFR calc non Af Amer: >90 mL/min (ref >90)
Glucose, Bld: 100 mg/dL — ABNORMAL HIGH (ref 70–99)
Potassium: 3.5 mEq/L (ref 3.5–5.1)
Sodium: 140 mEq/L (ref 135–145)

## 2011-05-11 LAB — HEPATIC FUNCTION PANEL
Albumin: 3.8 g/dL (ref 3.5–5.2)
Alkaline Phosphatase: 116 U/L (ref 39–117)
Indirect Bilirubin: 0.3 mg/dL (ref 0.3–0.9)
Total Protein: 6.8 g/dL (ref 6.0–8.3)

## 2011-05-11 LAB — CBC
MCHC: 33 g/dL (ref 30.0–36.0)
Platelets: 317 10*3/uL (ref 150–400)
RDW: 14.6 % (ref 11.5–15.5)
WBC: 5 10*3/uL (ref 4.0–10.5)

## 2011-05-11 LAB — LIPASE, BLOOD: Lipase: 16 U/L (ref 11–59)

## 2011-05-11 MED ORDER — ONDANSETRON HCL 4 MG/2ML IJ SOLN
4.0000 mg | Freq: Four times a day (QID) | INTRAMUSCULAR | Status: DC | PRN
  Administered 2011-05-11 – 2011-05-14 (×6): 4 mg via INTRAVENOUS
  Filled 2011-05-11 (×5): qty 2

## 2011-05-11 MED ORDER — PANTOPRAZOLE SODIUM 40 MG IV SOLR
40.0000 mg | Freq: Two times a day (BID) | INTRAVENOUS | Status: DC
  Administered 2011-05-11 – 2011-05-13 (×7): 40 mg via INTRAVENOUS
  Filled 2011-05-11 (×7): qty 40

## 2011-05-11 MED ORDER — ACETAMINOPHEN 325 MG PO TABS: 650.0000 mg | ORAL_TABLET | Freq: Four times a day (QID) | ORAL | Status: DC | PRN

## 2011-05-11 MED ORDER — OXYCODONE HCL 5 MG PO TABS
5.0000 mg | ORAL_TABLET | ORAL | Status: DC | PRN
  Administered 2011-05-11 – 2011-05-14 (×6): 5 mg via ORAL
  Filled 2011-05-11 (×8): qty 1

## 2011-05-11 MED ORDER — DEXTROSE-NACL 5-0.45 % IV SOLN
INTRAVENOUS | Status: DC
  Administered 2011-05-11: 03:00:00 via INTRAVENOUS
  Administered 2011-05-11: 50 mL via INTRAVENOUS
  Administered 2011-05-11 – 2011-05-12 (×2): via INTRAVENOUS

## 2011-05-11 MED ORDER — HYDROMORPHONE HCL PF 1 MG/ML IJ SOLN
1.0000 mg | INTRAMUSCULAR | Status: DC | PRN
  Administered 2011-05-11 – 2011-05-16 (×32): 1 mg via INTRAVENOUS
  Filled 2011-05-11 (×34): qty 1

## 2011-05-11 MED ORDER — ONDANSETRON HCL 4 MG PO TABS
4.0000 mg | ORAL_TABLET | Freq: Four times a day (QID) | ORAL | Status: DC | PRN
  Filled 2011-05-11: qty 1

## 2011-05-11 MED ORDER — METOPROLOL TARTRATE 1 MG/ML IV SOLN
5.0000 mg | INTRAVENOUS | Status: DC | PRN
  Administered 2011-05-11: 5 mg via INTRAVENOUS
  Filled 2011-05-11: qty 5

## 2011-05-11 MED ORDER — AMLODIPINE BESYLATE 5 MG PO TABS
5.0000 mg | ORAL_TABLET | Freq: Every day | ORAL | Status: DC
  Administered 2011-05-13 – 2011-05-19 (×7): 5 mg via ORAL
  Filled 2011-05-11 (×8): qty 1

## 2011-05-11 MED ORDER — ALBUTEROL SULFATE (5 MG/ML) 0.5% IN NEBU: 2.5000 mg | INHALATION_SOLUTION | RESPIRATORY_TRACT | Status: DC | PRN

## 2011-05-11 MED ORDER — QUETIAPINE FUMARATE 100 MG PO TABS
100.0000 mg | ORAL_TABLET | Freq: Two times a day (BID) | ORAL | Status: DC
  Administered 2011-05-11 – 2011-05-19 (×16): 100 mg via ORAL
  Filled 2011-05-11 (×21): qty 1

## 2011-05-11 MED ORDER — ACETAMINOPHEN 650 MG RE SUPP: 650.0000 mg | Freq: Four times a day (QID) | RECTAL | Status: DC | PRN

## 2011-05-11 MED ORDER — LORAZEPAM 0.5 MG PO TABS
0.5000 mg | ORAL_TABLET | Freq: Two times a day (BID) | ORAL | Status: DC
  Administered 2011-05-11 – 2011-05-19 (×17): 0.5 mg via ORAL
  Filled 2011-05-11 (×18): qty 1

## 2011-05-11 NOTE — ED Notes (Signed)
Patient states she is feeling a little better. Lights were cut off and patient was repositioned.

## 2011-05-11 NOTE — Progress Notes (Signed)
Subjective: This lady was admitted with 3 to four-day history of nausea and vomiting associated with epigastric abdominal pain. She tells me that she is known to have a duodenal stricture which was dilated approximately 4-5 weeks ago by Dr. Darrick Penna. I cannot find evidence of this in the medical record at this time.           Physical Exam: Blood pressure 161/99, pulse 77, temperature 98.5 F (36.9 C), temperature source Oral, resp. rate 20, height 5\' 4"  (1.626 m), weight 62.234 kg (137 lb 3.2 oz), SpO2 98.00%. She looks systemically well. She is not toxic or septic. She appears to look uncomfortable. Abdomen is soft but tender in the epigastric and right upper quadrant area. Bowel sounds are heard and they appeared to be normal. There is no significant hepatosplenomegaly. Otherwise, heart sounds are present and normal. Lung fields are clear. She is alert and orientated without any focal neurological signs.   Investigations:  Recent Results (from the past 240 hour(s))  MRSA PCR SCREENING     Status: Normal   Collection Time   05/11/11  2:08 AM      Component Value Range Status Comment   MRSA by PCR NEGATIVE  NEGATIVE  Final      Basic Metabolic Panel:  Basename 05/11/11 0440 05/10/11 1740  NA 140 140  K 3.5 3.8  CL 105 101  CO2 26 27  GLUCOSE 100* 101*  BUN 7 11  CREATININE 0.60 0.64  CALCIUM 9.3 10.3  MG -- --  PHOS -- --   Liver Function Tests:  [facility] 05/11/11 0440 05/10/11 1740  AST 65* 68*  ALT 42* 49*  ALKPHOS 112 130*  BILITOT 0.4 0.3  PROT 6.6 8.0  ALBUMIN 3.7 4.5     CBC:  Basename 05/11/11 0440 05/10/11 1740  WBC 5.0 6.7  NEUTROABS -- 4.5  HGB 11.6* 13.2  HCT 35.1* 40.5  MCV 86.7 86.5  PLT 317 352    Ct Abdomen Pelvis W Contrast  05/10/2011  *RADIOLOGY REPORT*  Clinical Data: Epigastric abdominal pain, nausea, vomiting, history of chronic pancreatitis  CT ABDOMEN AND PELVIS WITH CONTRAST  Technique:  Multidetector CT imaging of the abdomen  and pelvis was performed following the standard protocol during bolus administration of intravenous contrast.  Contrast: OMNIPAQUE IOHEXOL 300 MG/ML IV SOLN  Comparison: CT abdomen pelvis of 03/20/2011  Findings: The lung bases are clear. The lung bases are clear. There is diffuse severe fatty infiltration of the liver.  No ductal dilatation is seen.  Surgical clips are present from prior cholecystectomy.  Dilatation of the pancreatic duct is stable and may be due to the patient's history of chronic pancreatitis.  No present evidence of pancreatitis is seen.  The adrenal glands and spleen are unremarkable.  The stomach is moderately distended with contrast and is unremarkable.  A nodular area within the antrum of the stomach may represent retained food debris or mucosal thickening but a polypoid lesion cannot be excluded.  The kidneys enhance and there is a stable cyst emanating from the upper pole of the left kidney anterolaterally.  No renal calculi are seen and no hydronephrosis is noted.  The abdominal aorta is normal in caliber.  The urinary bladder is moderately urine distended and unremarkable. The uterus has previously been resected.  No fluid is seen within the pelvis.  A moderate amount of feces is noted throughout the entire colon.  The terminal ileum is unremarkable.  Partial compression deformity of T12  vertebral body is noted which appears old with some bony spurring present.  IMPRESSION:  1.  No acute abnormality is seen on CT of the abdomen and pelvis. 2.  Diffuse severe fatty infiltration of the liver. 3.  Polypoid area in the distal antrum of the stomach probably represents mucosal infolding or food debris. 4.  Moderate amount of feces throughout the entire colon. 5.  Apparent old compression deformity of T12.  Original Report Authenticated By: Juline Patch, M.D.      Medications: I have reviewed the patient's current medications.  Impression: 1. 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 History of peptic ulcer disease. 2. Rheumatoid arthritis. 3. Elevated blood pressure. She does not appear to have a history of hypertension and this elevation may be related to pain.      Plan: 1. Await gastroenterology opinion. 2. Antihypertensive for elevated blood pressure for the time being.     LOS: 1 day   Wilson Singer Pager [phone]  05/11/2011, 7:58 AM

## 2011-05-11 NOTE — Consult Note (Signed)
Referring Provider: Dr. Rito Ehrlich Primary Care Physician:  Anson Oregon, DO, DO Primary Gastroenterologist:  Dr. Darrick Penna   Date of Admission: 05/10/11  Date of Consultation: 05/11/11  Reason for Consultation: N/V, abdominal pain  HPI:  [name] is a 56 year old female known to our office, recently underwent EGD by Dr. Darrick Penna on 04/06/11 secondary to chronic abdominal pain and vomiting. Findings consistent with moderate gastritis, linear ulcer in duodenal bulb, stricture 1st part of duodenum, dilated to 12 mm. Patient states she was doing great after the procedure without any problems until Friday evening. Reports onset of epigastric pain, radiating to back, severe N/V. Was unable to tolerate fluids, small amounts of water causing dry heaves. Reports bilious emesis. Denies use of NSAIDs or aspirin powders, although BC powders are listed in outpatient meds. Denies diarrhea, changes in bowel habits. Moaning currently in bed due to pain.    Upon review of records, appears has had chronic epigastric pain for the past 1-2 years, intractable vomiting. Last note in our office in Jan mentioned pt was inconsistent with reporting hx. Appears UDS was positive for Methadone in Nov 2012 at [place], although pt had not been prescribed this since 7 years ago. Please see PSH, appears multiple EGDs in past by Dr. Samuella Cota and Dr. Allena Katz.   ~~~~~~~~~~~~~~~~~~~~~~~~~~~~~~~~~~~~~~~~~~~~~~~~~~~~~~~~~~~~~~~ Reports below copied from outpatient visit in Jan 2013:  CT A/P with contrast done at [facility] 03/20/2011:  Fatty liver, pancreatic duct mildly prominent, stable T12 compression fracture (CT report under Media in EPIC).  CT A/P with contrast done at [facility] 03/02/11:  Fatty liver, minimal wall enhancement of pylorus of stomach as well as second portion of duodenum. Few mildly prominent SB loops of left abdomen.  Abd u/s done at [facility] 01/23/2011:  Fatty liver, CBD 8.41mm, s/p cholecystectomy.    CT A/P without CM at [facility] 01/23/2011:  Fatty liver.  MRI abd with MRCP at [facility] 11/25/2010:  Common hepatic duct mildly dilation, 11mm, tapers distally such that distal CBD measures 5mm. No stones. Pancreas and pancreatic duct normal.  UGI series at [facility] 09/15/2009:  Unremarkable.    Was 143 in Jan, now 137.   Past Medical History  Diagnosis Date  . RA (rheumatoid arthritis)  2008  . MI (myocardial infarction) 2008  . Constipation 03/21/2011  . Hypokalemia 03/21/2011  . Fatty liver 03/21/2011  . Pancreatitis     states 3 years ago, very severe, ?biliary pancreatitis   . T12 compression fracture 2011  . Duodenal ulcer     remote per patient. +BC powders, patient report negative EGD six months ago    Past Surgical History  Procedure Date  . Cholecystectomy   . Knee surgery   . Appendectomy   . Complete hysterectomy   . Ventral wall hernia   . Esophagogastroduodenoscopy 08/2010    Dr. Samuella Cota, Versed. small hh, mild prepylori gastritis. path unavailable  . Esophagogastroduodenoscopy 04/2010    Dr. Samuella Cota, Versed. small hh, mild distal esophagitis, antral gastritis and duodenitis. Bx mild chronic gastritis and no H.pylori  . Esophagogastroduodenoscopy 08/2009    Dr. Allena Katz, Versed. Moderate gastritis, moderate duodenitis with nodularity in proximal duodenal bulb, bx chronic gastritis, no helicobacter, mild chronic duodenitis  . Esophagogastroduodenoscopy 02/2007    Dr. Samuella Cota, Versed. antral gastritis and duodenal ulcers. Bx mild chronic gastritis. No bx from duodenal ulcer available.  Gaspar Bidding dilation 04/06/2011    Procedure: SAVORY DILATION;  Surgeon: Arlyce Harman, MD;  Location: [facility];  Service:  Endoscopy;  Laterality: N/A;  16 mm  . Esophagogastroduodenoscopy 04/06/11    Dr. Darrick Penna: 1-2 cm hiatal hernia, mod gastritis, 73mmX3mm linear ulcer in duodenal bulb, stricture 1st part of duodenum, dilated to 12 mm, ?stricture causing N/V    Prior  to Admission medications   Medication Sig Start Date End Date Taking? Authorizing Provider  adalimumab (HUMIRA) 40 MG/0.8ML injection Inject 40 mg into the skin every 7 (seven) days.    Yes Historical Provider, MD  Aspirin-Salicylamide-Caffeine (BC HEADACHE POWDER PO) Take 1-2 packets by mouth as needed. For pain   Yes Historical Provider, MD  esomeprazole (NEXIUM) 40 MG capsule Take 40 mg by mouth daily before breakfast.     Yes Historical Provider, MD  HYDROcodone-acetaminophen (LORTAB 7.5) 7.5-500 MG per tablet Take 1 tablet by mouth every 6 (six) hours as needed. For pain   Yes Historical Provider, MD  Linaclotide (LINZESS) 145 MCG CAPS Take 1 capsule by mouth daily. 1 PO DAILY 04/06/11  Yes Arlyce Harman, MD  LORazepam (ATIVAN) 0.5 MG tablet Take 0.5 mg by mouth 2 (two) times daily.   Yes Historical Provider, MD  methotrexate (RHEUMATREX) 2.5 MG tablet Take 12.5 mg by mouth once a week. Caution:Chemotherapy. Protect from light.   Yes Historical Provider, MD  polyethylene glycol (MIRALAX / GLYCOLAX) packet Take 17 g by mouth 2 (two) times daily. 04/05/11  Yes Tana Coast, PA  QUEtiapine (SEROQUEL) 100 MG tablet Take 100 mg by mouth 2 (two) times daily.   Yes Historical Provider, MD  senna (SENOKOT) 8.6 MG tablet Take 1 tablet (8.6 mg total) by mouth daily. FOR CONSTIPATION. 03/23/11 03/22/12 Yes Elliot Cousin, MD  nitroGLYCERIN (NITROSTAT) 0.4 MG SL tablet Place 0.4 mg under the tongue every 5 (five) minutes as needed. Chest pain     Historical Provider, MD    Current Facility-Administered Medications  Medication Dose Route Frequency Provider Last Rate Last Dose  . acetaminophen (TYLENOL) tablet 650 mg  650 mg Oral Q6H PRN Osvaldo Shipper, MD       Or  . acetaminophen (TYLENOL) suppository 650 mg  650 mg Rectal Q6H PRN Osvaldo Shipper, MD      . albuterol (PROVENTIL) (5 MG/ML) 0.5% nebulizer solution 2.5 mg  2.5 mg Nebulization Q2H PRN Osvaldo Shipper, MD      . amLODipine (NORVASC) tablet 5 mg  5  mg Oral Daily Nimish C Gosrani, MD      . dextrose 5 %-0.45 % sodium chloride infusion   Intravenous Continuous Nimish Normajean Glasgow, MD 50 mL/hr at 05/11/11 1106    . HYDROmorphone (DILAUDID) injection 1 mg  1 mg Intravenous Q3H PRN Osvaldo Shipper, MD   1 mg at 05/11/11 1105  . HYDROmorphone (DILAUDID) injection 2 mg  2 mg Intravenous Once [name], MD   2 mg at 05/10/11 1737  . HYDROmorphone (DILAUDID) injection 2 mg  2 mg Intravenous Once [name], MD   2 mg at 05/10/11 1949  . iohexol (OMNIPAQUE) 300 MG/ML solution 100 mL  100 mL Intravenous Once PRN Medication Radiologist, MD   100 mL at 05/10/11 1939  . LORazepam (ATIVAN) tablet 0.5 mg  0.5 mg Oral BID Osvaldo Shipper, MD   0.5 mg at 05/11/11 0246  . ondansetron (ZOFRAN) injection 4 mg  4 mg Intravenous Once [name], MD   4 mg at 05/10/11 1737  . ondansetron (ZOFRAN) injection 4 mg  4 mg Intravenous Once [name], MD   4 mg  at 05/10/11 1947  . ondansetron (ZOFRAN) tablet 4 mg  4 mg Oral Q6H PRN Osvaldo Shipper, MD       Or  . ondansetron (ZOFRAN) injection 4 mg  4 mg Intravenous Q6H PRN Osvaldo Shipper, MD   4 mg at 05/11/11 0955  . oxyCODONE (Oxy IR/ROXICODONE) immediate release tablet 5 mg  5 mg Oral Q4H PRN Osvaldo Shipper, MD   5 mg at 05/11/11 0246  . pantoprazole (PROTONIX) injection 40 mg  40 mg Intravenous Q12H Osvaldo Shipper, MD   40 mg at 05/11/11 0940  . QUEtiapine (SEROQUEL) tablet 100 mg  100 mg Oral BID Osvaldo Shipper, MD      . DISCONTD: 0.9 %  sodium chloride infusion   Intravenous Continuous [name], MD 250 mL/hr at 05/10/11 2343    . DISCONTD: 0.9 %  sodium chloride infusion   Intravenous STAT [name], MD      . DISCONTD: HYDROmorphone (DILAUDID) injection 1 mg  1 mg Intravenous Q4H PRN [name], MD   1 mg at 05/10/11 2347  . DISCONTD: ondansetron (ZOFRAN) injection 4 mg  4 mg Intravenous Q8H PRN [name], MD   4 mg at 05/10/11 2343    Allergies as of  05/10/2011 - Review Complete 05/10/2011  Allergen Reaction Noted  . Compazine Shortness Of Breath 03/20/2011  . Phenergan Shortness Of Breath 03/20/2011  . Vistaril (hyzine) Shortness Of Breath 03/20/2011    Family History  Problem Relation Age of Onset  . Colon cancer Neg Hx   . Liver cancer Neg Hx   . Breast cancer Neg Hx   . Inflammatory bowel disease Neg Hx   . Heart attack Mother     History   Social History  . Marital Status: Divorced    Spouse Name: N/A    Number of Children: 1  . Years of Education: N/A   Occupational History  . unemployed    Social History Main Topics  . Smoking status: Never Smoker   . Smokeless tobacco: Not on file  . Alcohol Use: No  . Drug Use: No  . Sexually Active:    Other Topics Concern  . Not on file   Social History Narrative  . No narrative on file    Review of Systems: Gen: SEE HPI CV: Denies chest pain, heart palpitations, syncope, edema  Resp: Denies shortness of breath with rest, cough, wheezing GI: SEE HPI  GU : Denies urinary burning, urinary frequency, urinary incontinence.  MS: Denies joint pain,swelling, cramping Derm: Denies rash, itching, dry skin Psych: Denies depression, anxiety,confusion, or memory loss Heme: Denies bruising, bleeding, and enlarged lymph nodes.  Physical Exam: Vital signs in last 24 hours: Temp:  [97.9 F (36.6 C)-98.7 F (37.1 C)] 98.5 F (36.9 C) (02/19 0500) Pulse Rate:  [77-103] 77  (02/19 0500) Resp:  [16-22] 20  (02/19 0500) BP: (146-173)/(88-113) 161/99 mmHg (02/19 0500) SpO2:  [96 %-98 %] 98 % (02/19 0500) Weight:  [130 lb (58.968 kg)-137 lb 3.2 oz (62.234 kg)] 137 lb 3.2 oz (62.234 kg) (02/19 0500) Last BM Date: 05/06/11 General:   Alert,  Well-developed, well-nourished, appears uncomfortable Head:  Normocephalic and atraumatic. Eyes:  Sclera clear, no icterus.   Conjunctiva pink. Ears:  Normal auditory acuity. Nose:  No deformity, discharge,  or lesions. Mouth:  No  deformity or lesions, dentition normal. Neck:  Supple; no masses or thyromegaly. Lungs:  Clear throughout to auscultation.   No wheezes, crackles, or  rhonchi. No acute distress. Heart:  Regular rate and rhythm; no murmurs, clicks, rubs,  or gallops. Abdomen:  Soft, significantly TTP epigastric region and nondistended. No masses, hepatosplenomegaly or hernias noted. Normal bowel sounds,  Rectal:  Deferred until time of colonoscopy.   Msk:  Symmetrical without gross deformities. Normal posture. Pulses:  Normal pulses noted. Extremities:  Without clubbing or edema. Neurologic:  Alert and  oriented x4;  grossly normal neurologically. Skin:  Intact without significant lesions or rashes. Cervical Nodes:  No significant cervical adenopathy. Psych:  Concerned but cooperative  Intake/Output from previous day: 02/18 0701 - 02/19 0700 In: 421.7 [I.V.:421.7] Out: -  Intake/Output this shift: Total I/O In: 302 [I.V.:300; IV Piggyback:2] Out: -   Lab Results:  Basename 05/11/11 0440 05/10/11 1740  WBC 5.0 6.7  HGB 11.6* 13.2  HCT 35.1* 40.5  PLT 317 352   BMET  Basename 05/11/11 0440 05/10/11 1740  NA 140 140  K 3.5 3.8  CL 105 101  CO2 26 27  GLUCOSE 100* 101*  BUN 7 11  CREATININE 0.60 0.64  CALCIUM 9.3 10.3   LFT  Basename 05/11/11 1105 05/11/11 0440 05/10/11 1740  PROT 6.8 6.6 8.0  ALBUMIN 3.8 3.7 4.5  AST 63* 65* 68*  ALT 43* 42* 49*  ALKPHOS 116 112 130*  BILITOT 0.4 0.4 0.3  BILIDIR 0.1 -- --  IBILI 0.3 -- --    Studies/Results: Ct Abdomen Pelvis W Contrast  05/10/2011  *RADIOLOGY REPORT*  Clinical Data: Epigastric abdominal pain, nausea, vomiting, history of chronic pancreatitis  CT ABDOMEN AND PELVIS WITH CONTRAST  Technique:  Multidetector CT imaging of the abdomen and pelvis was performed following the standard protocol during bolus administration of intravenous contrast.  Contrast: OMNIPAQUE IOHEXOL 300 MG/ML IV SOLN  Comparison: CT abdomen pelvis of  03/20/2011  Findings: The lung bases are clear. The lung bases are clear. There is diffuse severe fatty infiltration of the liver.  No ductal dilatation is seen.  Surgical clips are present from prior cholecystectomy.  Dilatation of the pancreatic duct is stable and may be due to the patient's history of chronic pancreatitis.  No present evidence of pancreatitis is seen.  The adrenal glands and spleen are unremarkable.  The stomach is moderately distended with contrast and is unremarkable.  A nodular area within the antrum of the stomach may represent retained food debris or mucosal thickening but a polypoid lesion cannot be excluded.  The kidneys enhance and there is a stable cyst emanating from the upper pole of the left kidney anterolaterally.  No renal calculi are seen and no hydronephrosis is noted.  The abdominal aorta is normal in caliber.  The urinary bladder is moderately urine distended and unremarkable. The uterus has previously been resected.  No fluid is seen within the pelvis.  A moderate amount of feces is noted throughout the entire colon.  The terminal ileum is unremarkable.  Partial compression deformity of T12 vertebral body is noted which appears old with some bony spurring present.  IMPRESSION:  1.  No acute abnormality is seen on CT of the abdomen and pelvis. 2.  Diffuse severe fatty infiltration of the liver. 3.  Polypoid area in the distal antrum of the stomach probably represents mucosal infolding or food debris. 4.  Moderate amount of feces throughout the entire colon. 5.  Apparent old compression deformity of T12.  Original Report Authenticated By: Juline Patch, M.D.    Impression: 56 year old female with hx of chronic  abdominal pain, N/V, with multiple endoscopic procedures in the past as described above. Most recent EGD Apr 06, 2011, with duodenal ulcer and stricture s/p dilation; pt reports significant improvement after this was completed. Acute recurrence of epigastric pain, N/V  last Friday. Also of note, mild rise in AST/ALT for this admission, previously normal in Dec 2012. CT described above without acute process. Question of recurrent stricture, anticipate possible EGD this admission for further evaluation. However, bump in transaminases will be monitored. Repeat HFP was ordered for noon today, and it is overall stable with previous findings from this morning. Unable to exclude occult underlying process if EGD returns negative. Wt loss concerning. Continue PPI and anticipate EGD in near future with possible dilation if necessary. As of note, pt denies NSAIDs or aspirin powders, but BC powders are listed on her outpatient meds. There has been question of historical consistency in the past as noted in HPI.  As a separate issue, no prior TCS, will need one done outpatient after current symptoms resolved.    Plan: NPO for now Supportive measures HFP in am Anticipate EGD this admission (?2/20) PPI   LOS: 1 day   Gerrit Halls  05/11/2011, 12:47 PM    Given hx of duodenal stricture - recently dilated; now with recurrent symptoms, need to consider possibility of re-stenosis. Will offer repeat EGD to re-assess and evaluate for other pathology.The risks, benefits, limitations, alternatives and imponderables have been reviewed with the patient. Potential for esophageal dilation, biopsy, etc. have also been reviewed.  Questions have been answered. All parties agreeable.  In addition, bump in lft's and dilated pancreatic duct noted - may need further evaluation but sill see what EGD shows first.

## 2011-05-12 ENCOUNTER — Encounter (HOSPITAL_COMMUNITY)
Admission: EM | Disposition: A | Discharge status: Home / Self Care | Payer: Self-pay | Source: Home / Self Care | Attending: Internal Medicine

## 2011-05-12 ENCOUNTER — Encounter (HOSPITAL_COMMUNITY): Payer: Self-pay | Admitting: *Deleted

## 2011-05-12 DIAGNOSIS — R112 Nausea with vomiting, unspecified: Secondary | ICD-10-CM

## 2011-05-12 DIAGNOSIS — K315 Obstruction of duodenum: Secondary | ICD-10-CM

## 2011-05-12 HISTORY — PX: ESOPHAGOGASTRODUODENOSCOPY: SHX1529

## 2011-05-12 LAB — CBC
MCH: 28.2 pg (ref 26.0–34.0)
MCHC: 32.5 g/dL (ref 30.0–36.0)
Platelets: 286 10*3/uL (ref 150–400)
RDW: 14.9 % (ref 11.5–15.5)

## 2011-05-12 LAB — COMPREHENSIVE METABOLIC PANEL
ALT: 39 U/L — ABNORMAL HIGH (ref 0–35)
AST: 55 U/L — ABNORMAL HIGH (ref 0–37)
Calcium: 10.2 mg/dL (ref 8.4–10.5)
Creatinine, Ser: 0.65 mg/dL (ref 0.50–1.10)
GFR calc Af Amer: >90 mL/min (ref >90)
Sodium: 140 mEq/L (ref 135–145)
Total Protein: 6.8 g/dL (ref 6.0–8.3)

## 2011-05-12 SURGERY — ESOPHAGOGASTRODUODENOSCOPY (EGD) WITH ESOPHAGEAL DILATION
Anesthesia: Moderate Sedation

## 2011-05-12 MED ORDER — MIDAZOLAM HCL 5 MG/5ML IJ SOLN
INTRAMUSCULAR | Status: AC
  Filled 2011-05-12: qty 10

## 2011-05-12 MED ORDER — BUTAMBEN-TETRACAINE-BENZOCAINE 2-2-14 % EX AERO
INHALATION_SPRAY | CUTANEOUS | Status: DC | PRN
  Administered 2011-05-12: 2 via TOPICAL

## 2011-05-12 MED ORDER — MEPERIDINE HCL 100 MG/ML IJ SOLN
INTRAMUSCULAR | Status: DC | PRN
  Administered 2011-05-12: 25 mg via INTRAVENOUS
  Administered 2011-05-12: 50 mg via INTRAVENOUS
  Administered 2011-05-12: 25 mg via INTRAVENOUS

## 2011-05-12 MED ORDER — STERILE WATER FOR IRRIGATION IR SOLN
Status: DC | PRN
  Administered 2011-05-12: 13:00:00

## 2011-05-12 MED ORDER — MIDAZOLAM HCL 5 MG/5ML IJ SOLN
INTRAMUSCULAR | Status: DC | PRN
  Administered 2011-05-12: 1 mg via INTRAVENOUS
  Administered 2011-05-12 (×2): 2 mg via INTRAVENOUS

## 2011-05-12 MED ORDER — MEPERIDINE HCL 100 MG/ML IJ SOLN
INTRAMUSCULAR | Status: AC
  Filled 2011-05-12: qty 2

## 2011-05-12 MED ORDER — SODIUM CHLORIDE 0.45 % IV SOLN
INTRAVENOUS | Status: DC
  Administered 2011-05-12: 13:00:00 via INTRAVENOUS

## 2011-05-12 NOTE — Progress Notes (Signed)
Pt has gone to endoscopy via stretcher for egd and dilation

## 2011-05-12 NOTE — Progress Notes (Signed)
PT HAS RETURNED FROM ENDOSCOPY.VS STABLE. DROWSY BUT AROUSEABLE. HUSBAND AT BEDSIDE

## 2011-05-12 NOTE — Progress Notes (Signed)
Subjective: This lady continues to have nausea and vomiting and epigastric pain. She is going to have EGD this morning with Dr. Kendell Bane. I think this is appropriate.           Physical Exam: Blood pressure 129/84, pulse 76, temperature 97.6 F (36.4 C), temperature source Oral, resp. rate 18, height 5\' 4"  (1.626 m), weight 62.234 kg (137 lb 3.2 oz), SpO2 97.00%. She looks systemically well. She is not toxic or septic. She appears to look uncomfortable. Abdomen is soft but tender in the epigastric and right upper quadrant area. Bowel sounds are heard and they appeared to be normal. There is no significant hepatosplenomegaly. Otherwise, heart sounds are present and normal. Lung fields are clear. She is alert and orientated without any focal neurological signs.   Investigations:  Recent Results (from the past 240 hour(s))  MRSA PCR SCREENING     Status: Normal   Collection Time   05/11/11  2:08 AM      Component Value Range Status Comment   MRSA by PCR NEGATIVE  NEGATIVE  Final      Basic Metabolic Panel:  Basename 05/12/11 0638 05/11/11 0440  NA 140 140  K 3.5 3.5  CL 104 105  CO2 27 26  GLUCOSE 91 100*  BUN 4* 7  CREATININE 0.65 0.60  CALCIUM 10.2 9.3  MG -- --  PHOS -- --   Liver Function Tests:  [facility] 05/12/11 0638 05/11/11 1105  AST 55* 63*  ALT 39* 43*  ALKPHOS 114 116  BILITOT 0.3 0.4  PROT 6.8 6.8  ALBUMIN 3.7 3.8     CBC:  Basename 05/12/11 0638 05/11/11 0440 05/10/11 1740  WBC 3.8* 5.0 --  NEUTROABS -- -- 4.5  HGB 12.2 11.6* --  HCT 37.5 35.1* --  MCV 86.8 86.7 --  PLT 286 317 --    Ct Abdomen Pelvis W Contrast  05/10/2011  *RADIOLOGY REPORT*  Clinical Data: Epigastric abdominal pain, nausea, vomiting, history of chronic pancreatitis  CT ABDOMEN AND PELVIS WITH CONTRAST  Technique:  Multidetector CT imaging of the abdomen and pelvis was performed following the standard protocol during bolus administration of intravenous contrast.  Contrast:  OMNIPAQUE IOHEXOL 300 MG/ML IV SOLN  Comparison: CT abdomen pelvis of 03/20/2011  Findings: The lung bases are clear. The lung bases are clear. There is diffuse severe fatty infiltration of the liver.  No ductal dilatation is seen.  Surgical clips are present from prior cholecystectomy.  Dilatation of the pancreatic duct is stable and may be due to the patient's history of chronic pancreatitis.  No present evidence of pancreatitis is seen.  The adrenal glands and spleen are unremarkable.  The stomach is moderately distended with contrast and is unremarkable.  A nodular area within the antrum of the stomach may represent retained food debris or mucosal thickening but a polypoid lesion cannot be excluded.  The kidneys enhance and there is a stable cyst emanating from the upper pole of the left kidney anterolaterally.  No renal calculi are seen and no hydronephrosis is noted.  The abdominal aorta is normal in caliber.  The urinary bladder is moderately urine distended and unremarkable. The uterus has previously been resected.  No fluid is seen within the pelvis.  A moderate amount of feces is noted throughout the entire colon.  The terminal ileum is unremarkable.  Partial compression deformity of T12 vertebral body is noted which appears old with some bony spurring present.  IMPRESSION:  1.  No  acute abnormality is seen on CT of the abdomen and pelvis. 2.  Diffuse severe fatty infiltration of the liver. 3.  Polypoid area in the distal antrum of the stomach probably represents mucosal infolding or food debris. 4.  Moderate amount of feces throughout the entire colon. 5.  Apparent old compression deformity of T12.  Original Report Authenticated By: Juline Patch, M.D.      Medications: I have reviewed the patient's current medications.  Impression: 1. Nausea and vomiting, possibly related to recurrent duodenal stricture. History of peptic ulcer disease. 2. Rheumatoid arthritis. 3. Elevated blood pressure.  She does not appear to have a history of hypertension and this elevation may be related to pain.      Plan: 1. Await EGD today.      LOS: 2 days   Wilson Singer Pager [phone]  05/12/2011, 7:57 AM

## 2011-05-12 NOTE — Op Note (Signed)
[facility] [address] [place], [place]  [address]  ENDOSCOPY PROCEDURE REPORT  PATIENT:  Jordan, Haney  MR#:  [id number] BIRTHDATE:  16-Feb-1956, 56 yrs. old  GENDER:  female  ENDOSCOPIST:  R. Roetta Sessions, MD FACP [facility] Referred by:          Halford Chessman DO, [place], [place]  PROCEDURE DATE:  05/12/2011 PROCEDURE:  EGD with balloon dilation gastric outlet obstruction  INDICATIONS:  nausea vomiting; history of  gastric outlet obstruction - dilated last month. Unfortunately, ongoing NSAID use/abuse in the form of [employer] brand ibuprofen -per patient report.  INFORMED CONSENT:   The risks, benefits, limitations, alternatives and imponderables have been discussed.  The potential for biopsy, esophogeal dilation, etc. have also been reviewed.  Questions have been answered.  All parties agreeable.  Please see the history and physical in the medical record for more information.  MEDICATIONS:     Versed 5 mg IV and Demerol 100 mg IV in divided doses. Cetacaine spray  DESCRIPTION OF PROCEDURE:   The OZ-3086V ([id number]) endoscope was introduced through the mouth and advanced to the second portion of the duodenum without difficulty or limitations.  The mucosal surfaces were surveyed very carefully during advancement of the scope and upon withdrawal.  Retroflexion view of the proximal stomach and esophagogastric junction was performed.  FINDINGS:  Normal esophagus. Dilated stomach with much fluid contained within it- easily suctioned out; stomach otherwise empty..     Patent pylorus. An inflammatory appearing stricture between the bulb and second portion was noted. I was unable to advance the scope through it initieally.  There is marked friability of the mucosa noted although no discrete ulcer was seen.     No tumor was seen.  THERAPEUTIC / DIAGNOSTIC MANEUVERS PERFORMED:     I advanced a graduated 10-12 mm TTS pyloric channe dilating balloon across the stricture  easily. I inflated it to 11 mm x1 minute; very little effect noted. Subsequently, inflated fully to 12 mm and held for 1 minute. The balloon was taken down and removed. I then was able to advance the scope across the stricture into the second portion of the duodenum (which appeared normal). Again, I was unable to identify a discrete peptic ulcer although there was marked friability and denuding of the mucosa through the area of the appproximate  1 cm in length stricture.  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 This lesion is most likely NSAID-related phenomenon. This lesion is at high risk of restenosing  RECOMMENDATIONS:  Trial -  clear liquid diet.  Absolutely no NSAIDs-any form whatsoever  Twice a day PPI therapy. Brief trial of Carafate suspension  ______________________________ R. Roetta Sessions, MD Caleen Essex  CC:  n. eSIGNED:   R. Roetta Sessions at 05/12/2011 02:09 PM  Roger Kill,   [phone]

## 2011-05-13 DIAGNOSIS — Z9889 Other specified postprocedural states: Secondary | ICD-10-CM

## 2011-05-13 DIAGNOSIS — K315 Obstruction of duodenum: Secondary | ICD-10-CM

## 2011-05-13 DIAGNOSIS — R7989 Other specified abnormal findings of blood chemistry: Secondary | ICD-10-CM

## 2011-05-13 LAB — COMPREHENSIVE METABOLIC PANEL
AST: 45 U/L — ABNORMAL HIGH (ref 0–37)
Alkaline Phosphatase: 108 U/L (ref 39–117)
BUN: 4 mg/dL — ABNORMAL LOW (ref 6–23)
CO2: 27 mEq/L (ref 19–32)
Chloride: 104 mEq/L (ref 96–112)
Creatinine, Ser: 0.67 mg/dL (ref 0.50–1.10)
GFR calc non Af Amer: >90 mL/min (ref >90)
Potassium: 3.2 mEq/L — ABNORMAL LOW (ref 3.5–5.1)
Total Bilirubin: 0.4 mg/dL (ref 0.3–1.2)

## 2011-05-13 LAB — URINE CULTURE

## 2011-05-13 LAB — PROTIME-INR: INR: 1.1 (ref 0.00–1.49)

## 2011-05-13 MED ORDER — CIPROFLOXACIN HCL 250 MG PO TABS
250.0000 mg | ORAL_TABLET | Freq: Two times a day (BID) | ORAL | Status: DC
  Administered 2011-05-13 – 2011-05-17 (×9): 250 mg via ORAL
  Filled 2011-05-13 (×9): qty 1

## 2011-05-13 MED ORDER — POTASSIUM CHLORIDE CRYS ER 20 MEQ PO TBCR
40.0000 meq | EXTENDED_RELEASE_TABLET | Freq: Once | ORAL | Status: AC
  Administered 2011-05-13: 40 meq via ORAL
  Filled 2011-05-13 (×2): qty 1

## 2011-05-13 MED ORDER — SUCRALFATE 1 GM/10ML PO SUSP: 1.0000 g | Freq: Three times a day (TID) | ORAL | Status: DC

## 2011-05-13 MED ORDER — SODIUM CHLORIDE 0.9 % IJ SOLN
INTRAMUSCULAR | Status: AC
  Administered 2011-05-13: 3 mL
  Filled 2011-05-13: qty 3

## 2011-05-13 MED ORDER — SUCRALFATE 1 GM/10ML PO SUSP
1.0000 g | Freq: Three times a day (TID) | ORAL | Status: DC
  Administered 2011-05-13 – 2011-05-19 (×25): 1 g via ORAL
  Filled 2011-05-13 (×26): qty 10

## 2011-05-13 MED ORDER — SODIUM CHLORIDE 0.9 % IJ SOLN
INTRAMUSCULAR | Status: AC
  Administered 2011-05-13: 10 mL
  Filled 2011-05-13: qty 3

## 2011-05-13 NOTE — Progress Notes (Signed)
Utilization review completed.  

## 2011-05-13 NOTE — Progress Notes (Signed)
Subjective: This lady had and EGD yesterday and elevation of the gastric outlet obstruction. She has done well since that time and has tolerated clear fluids. She still has some abdominal tenderness in the epigastric area. She appears to have UTI on urine culture. She denied any symptoms of this.           Physical Exam: Blood pressure 108/72, pulse 80, temperature 98.1 F (36.7 C), temperature source Oral, resp. rate 20, height 5\' 4"  (1.626 m), weight 62.143 kg (137 lb), SpO2 96.00%. She looks systemically well. She is not toxic or septic. . Abdomen is soft and overall feels less tender than yesterday. She is still tender and now in the left upper quadrant. Bowel sounds are heard and they appeared to be normal. There is no significant hepatosplenomegaly. Otherwise, heart sounds are present and normal. Lung fields are clear. She is alert and orientated without any focal neurological signs.   Investigations:  Recent Results (from the past 240 hour(s))  URINE CULTURE     Status: Normal   Collection Time   05/10/11 10:28 PM      Component Value Range Status Comment   Specimen Description URINE, CLEAN CATCH   Final    Special Requests NONE   Final    Culture  Setup Time [id number]   Final    Colony Count 75,000 COLONIES/ML   Final    Culture ESCHERICHIA COLI   Final    Report Status 05/13/2011 FINAL   Final    Organism ID, Bacteria ESCHERICHIA COLI   Final   MRSA PCR SCREENING     Status: Normal   Collection Time   05/11/11  2:08 AM      Component Value Range Status Comment   MRSA by PCR NEGATIVE  NEGATIVE  Final      Basic Metabolic Panel:  Basename 05/13/11 0530 05/12/11 0638  NA 140 140  K 3.2* 3.5  CL 104 104  CO2 27 27  GLUCOSE 98 91  BUN 4* 4*  CREATININE 0.67 0.65  CALCIUM 9.8 10.2  MG -- --  PHOS -- --   Liver Function Tests:  Basename 05/13/11 0530 05/12/11 0638  AST 45* 55*  ALT 34 39*  ALKPHOS 108 114  BILITOT 0.4 0.3  PROT 6.6 6.8  ALBUMIN 3.5 3.7       CBC:  Basename 05/12/11 0638 05/11/11 0440 05/10/11 1740  WBC 3.8* 5.0 --  NEUTROABS -- -- 4.5  HGB 12.2 11.6* --  HCT 37.5 35.1* --  MCV 86.8 86.7 --  PLT 286 317 --    No results found.    Medications: I have reviewed the patient's current medications.  Impression: 1. Duodenal stricture, status post dilation. 2.E coli UTI. Elevated BP. 3.Elevated LFTS,improving.     Plan: 1.Advance diet. 2.Oral Ciprofloxacin . 3.Home soon.      LOS: 3 days   Wilson Singer Pager [phone]  05/13/2011, 7:22 AM

## 2011-05-13 NOTE — Progress Notes (Addendum)
Subjective:  Patient reports nausea and early satiety, epigastric pain with eating jello this morning. No vomiting. No BM.  Objective: Vital signs in last 24 hours: Temp:  [97.6 F (36.4 C)-98.7 F (37.1 C)] 98 F (36.7 C) (02/21 0800) Pulse Rate:  [70-97] 76  (02/21 0800) Resp:  [13-22] 20  (02/21 0800) BP: (108-162)/(72-102) 118/76 mmHg (02/21 0800) SpO2:  [95 %-100 %] 98 % (02/21 0800) Weight:  [137 lb (62.143 kg)] 137 lb (62.143 kg) (02/20 1238) Last BM Date: 05/06/11 General:   Alert,  Appears uncomfortable. NAD Head:  Normocephalic and atraumatic. Eyes:  Sclera clear, no icterus.  Chest: CTA bilaterally without rales, rhonchi, crackles.    Heart:  Regular rate and rhythm; no murmurs, clicks, rubs,  or gallops. Abdomen:  Soft, mild epigastric tenderness. Nondistended.  Normal bowel sounds, without guarding, and without rebound.   Extremities:  Without clubbing, deformity or edema. Neurologic:  Alert and  oriented x4;  grossly normal neurologically. Skin:  Intact without significant lesions or rashes. Psych:  Alert and cooperative. Normal mood and affect.  Intake/Output from previous day: 02/20 0701 - 02/21 0700 In: 2118.7 [P.O.:960; I.V.:1146.7; IV Piggyback:12] Out: 375 [Urine:375] Intake/Output this shift: Total I/O In: 240 [P.O.:240] Out: -   Lab Results: CBC  Basename 05/12/11 0638 05/11/11 0440 05/10/11 1740  WBC 3.8* 5.0 6.7  HGB 12.2 11.6* 13.2  HCT 37.5 35.1* 40.5  MCV 86.8 86.7 86.5  PLT 286 317 352   BMET  Basename 05/13/11 0530 05/12/11 0638 05/11/11 0440  NA 140 140 140  K 3.2* 3.5 3.5  CL 104 104 105  CO2 27 27 26   GLUCOSE 98 91 100*  BUN 4* 4* 7  CREATININE 0.67 0.65 0.60  CALCIUM 9.8 10.2 9.3   LFTs  Basename 05/13/11 0530 05/12/11 0638 05/11/11 1105  BILITOT 0.4 0.3 0.4  BILIDIR -- -- 0.1  IBILI -- -- 0.3  ALKPHOS 108 114 116  AST 45* 55* 63*  ALT 34 39* 43*  PROT 6.6 6.8 6.8  ALBUMIN 3.5 3.7 3.8    Basename 05/11/11 0440  05/10/11 1740  LIPASE 16 17   PT/INR  Basename 05/13/11 0530  LABPROT 14.4  INR 1.10      Imaging Studies: Ct Abdomen Pelvis W Contrast  05/10/2011  *RADIOLOGY REPORT*  Clinical Data: Epigastric abdominal pain, nausea, vomiting, history of chronic pancreatitis  CT ABDOMEN AND PELVIS WITH CONTRAST  Technique:  Multidetector CT imaging of the abdomen and pelvis was performed following the standard protocol during bolus administration of intravenous contrast.  Contrast: OMNIPAQUE IOHEXOL 300 MG/ML IV SOLN  Comparison: CT abdomen pelvis of 03/20/2011  Findings: The lung bases are clear. The lung bases are clear. There is diffuse severe fatty infiltration of the liver.  No ductal dilatation is seen.  Surgical clips are present from prior cholecystectomy.  Dilatation of the pancreatic duct is stable and may be due to the patient's history of chronic pancreatitis.  No present evidence of pancreatitis is seen.  The adrenal glands and spleen are unremarkable.  The stomach is moderately distended with contrast and is unremarkable.  A nodular area within the antrum of the stomach may represent retained food debris or mucosal thickening but a polypoid lesion cannot be excluded.  The kidneys enhance and there is a stable cyst emanating from the upper pole of the left kidney anterolaterally.  No renal calculi are seen and no hydronephrosis is noted.  The abdominal aorta is normal in caliber.  The urinary bladder is moderately urine distended and unremarkable. The uterus has previously been resected.  No fluid is seen within the pelvis.  A moderate amount of feces is noted throughout the entire colon.  The terminal ileum is unremarkable.  Partial compression deformity of T12 vertebral body is noted which appears old with some bony spurring present.  IMPRESSION:  1.  No acute abnormality is seen on CT of the abdomen and pelvis. 2.  Diffuse severe fatty infiltration of the liver. 3.  Polypoid area in the distal  antrum of the stomach probably represents mucosal infolding or food debris. 4.  Moderate amount of feces throughout the entire colon. 5.  Apparent old compression deformity of T12.  Original Report Authenticated By: Juline Patch, M.D.  [2 weeks]   Assessment: Partial GOO secondary to duodenal stricture likely secondary to ongoing NSAID/ASA use.   New transaminitis improving. H/O chronic pancreatic duct dilation. No CBD dilation on current CT.  Plan: 1. Continue clear liquids. 2. Add Carafate for 1 week. 3. Continue PPI BID. 4. Viral markers. LFTs in am.   LOS: 3 days   Tana Coast  05/13/2011, 10:12 AM   As above; consider outpt EUS to further evaluate dilated PD

## 2011-05-14 ENCOUNTER — Inpatient Hospital Stay (HOSPITAL_COMMUNITY)

## 2011-05-14 ENCOUNTER — Telehealth: Payer: Self-pay | Admitting: Gastroenterology

## 2011-05-14 LAB — HEPATIC FUNCTION PANEL
ALT: 26 U/L (ref 0–35)
Albumin: 3.2 g/dL — ABNORMAL LOW (ref 3.5–5.2)
Alkaline Phosphatase: 97 U/L (ref 39–117)
Total Bilirubin: 0.3 mg/dL (ref 0.3–1.2)
Total Protein: 6.1 g/dL (ref 6.0–8.3)

## 2011-05-14 MED ORDER — PANTOPRAZOLE SODIUM 40 MG PO TBEC
40.0000 mg | DELAYED_RELEASE_TABLET | Freq: Two times a day (BID) | ORAL | Status: DC
  Administered 2011-05-14 – 2011-05-19 (×12): 40 mg via ORAL
  Filled 2011-05-14 (×12): qty 1

## 2011-05-14 MED ORDER — SODIUM CHLORIDE 0.9 % IJ SOLN
INTRAMUSCULAR | Status: AC
  Filled 2011-05-14: qty 3

## 2011-05-14 MED ORDER — SODIUM CHLORIDE 0.9 % IJ SOLN
INTRAMUSCULAR | Status: AC
  Administered 2011-05-14: 3 mL
  Filled 2011-05-14: qty 3

## 2011-05-14 NOTE — Progress Notes (Signed)
Subjective: Tolerated liquids but had recurrence of epigastric pain when attempted solids. Also had vomiting.   Objective: Vital signs in last 24 hours: Temp:  [97.4 F (36.3 C)-98.7 F (37.1 C)] 98.4 F (36.9 C) (02/22 0606) Pulse Rate:  [85-97] 87  (02/22 0606) Resp:  [20] 20  (02/22 0606) BP: (108-118)/(72-74) 109/73 mmHg (02/22 0606) SpO2:  [94 %-97 %] 94 % (02/22 0606) Weight change:  Last BM Date: 05/06/11  Intake/Output from previous day: 02/21 0701 - 02/22 0700 In: 360 [P.O.:360] Out: -  Total I/O In: 545 [P.O.:545] Out: -    Physical Exam: General: Alert, awake, oriented x3, in no acute distress. HEENT: No bruits, no goiter. Heart: Regular rate and rhythm, without murmurs, rubs, gallops. Lungs: Clear to auscultation bilaterally. Abdomen: Soft, tender in epigastrium, nondistended, positive bowel sounds. Extremities: No clubbing cyanosis or edema with positive pedal pulses. Neuro: Grossly intact, nonfocal.    Lab Results: Basic Metabolic Panel:  Basename 05/13/11 0530 05/12/11 0638  NA 140 140  K 3.2* 3.5  CL 104 104  CO2 27 27  GLUCOSE 98 91  BUN 4* 4*  CREATININE 0.67 0.65  CALCIUM 9.8 10.2  MG -- --  PHOS -- --   Liver Function Tests:  Basename 05/14/11 0458 05/13/11 0530  AST 33 45*  ALT 26 34  ALKPHOS 97 108  BILITOT 0.3 0.4  PROT 6.1 6.6  ALBUMIN 3.2* 3.5   No results found for this basename: LIPASE:2,AMYLASE:2 in the last 72 hours No results found for this basename: AMMONIA:2 in the last 72 hours CBC:  Basename 05/12/11 0638  WBC 3.8*  NEUTROABS --  HGB 12.2  HCT 37.5  MCV 86.8  PLT 286   Cardiac Enzymes: No results found for this basename: CKTOTAL:3,CKMB:3,CKMBINDEX:3,TROPONINI:3 in the last 72 hours BNP: No results found for this basename: PROBNP:3 in the last 72 hours D-Dimer: No results found for this basename: DDIMER:2 in the last 72 hours CBG: No results found for this basename: GLUCAP:6 in the last 72  hours Hemoglobin A1C: No results found for this basename: HGBA1C in the last 72 hours Fasting Lipid Panel: No results found for this basename: CHOL,HDL,LDLCALC,TRIG,CHOLHDL,LDLDIRECT in the last 72 hours Thyroid Function Tests: No results found for this basename: TSH,T4TOTAL,FREET4,T3FREE,THYROIDAB in the last 72 hours Anemia Panel: No results found for this basename: VITAMINB12,FOLATE,FERRITIN,TIBC,IRON,RETICCTPCT in the last 72 hours Coagulation:  Basename 05/13/11 0530  LABPROT 14.4  INR 1.10   Urine Drug Screen: Drugs of Abuse  No results found for this basename: labopia, cocainscrnur, labbenz, amphetmu, thcu, labbarb    Alcohol Level: No results found for this basename: ETH:2 in the last 72 hours Urinalysis: No results found for this basename: COLORURINE:2,APPERANCEUR:2,LABSPEC:2,PHURINE:2,GLUCOSEU:2,HGBUR:2,BILIRUBINUR:2,KETONESUR:2,PROTEINUR:2,UROBILINOGEN:2,NITRITE:2,LEUKOCYTESUR:2 in the last 72 hours  Recent Results (from the past 240 hour(s))  URINE CULTURE     Status: Normal   Collection Time   05/10/11 10:28 PM      Component Value Range Status Comment   Specimen Description URINE, CLEAN CATCH   Final    Special Requests NONE   Final    Culture  Setup Time [id number]   Final    Colony Count 75,000 COLONIES/ML   Final    Culture ESCHERICHIA COLI   Final    Report Status 05/13/2011 FINAL   Final    Organism ID, Bacteria ESCHERICHIA COLI   Final   MRSA PCR SCREENING     Status: Normal   Collection Time   05/11/11  2:08 AM  Component Value Range Status Comment   MRSA by PCR NEGATIVE  NEGATIVE  Final     Studies/Results: No results found.  Medications: Scheduled Meds:   . amLODipine  5 mg Oral Daily  . ciprofloxacin  250 mg Oral BID  . LORazepam  0.5 mg Oral BID  . pantoprazole  40 mg Oral BID AC  . QUEtiapine  100 mg Oral BID  . sodium chloride      . sodium chloride      . sodium chloride      . sucralfate  1 g Oral TID WC & HS  . DISCONTD:  pantoprazole (PROTONIX) IV  40 mg Intravenous Q12H  . DISCONTD: sucralfate  1 g Oral TID WC & HS   Continuous Infusions:  PRN Meds:.acetaminophen, acetaminophen, albuterol, HYDROmorphone, metoprolol, ondansetron (ZOFRAN) IV, ondansetron, oxyCODONE  Assessment/Plan:  1. Duodenal stricture, status post dilation.  Patient now has recurrence of symptoms, will likely need upper GI series, will change back to liquid diet until this can be performed. She is continued on BID PPI and carafate.  Appreciate GI assistance.  2. E coli UTI. Started on oral cipro, will complete 5 day course  3. Hypertension.  Controlled on meds.  4. Elevated LFTS, resolved.   LOS: 4 days   Pearly Apachito [facility] Pager: [phone] 05/14/2011, 12:33 PM

## 2011-05-14 NOTE — Telephone Encounter (Signed)
Please schedule EUS in 3-4 weeks for dilated pancreatic duct, chronic pancreatitis, abnormal LFTs.  Please send copy of last OV note 04/05/11, consult note (under notes) dated 05/11/11. Last two EGDs. Last CT A/P.   Please schedule f/u OV here after EUS.

## 2011-05-14 NOTE — Progress Notes (Signed)
Subjective:  Patient c/o epigastric pain with any PO intake. Tried to eat some pancakes this morning, could only eat few bites before pain and nausea started. Woke up at 630am with lots of pain. No BM.   Objective: Vital signs in last 24 hours: Temp:  [97.4 F (36.3 C)-98.7 F (37.1 C)] 98.4 F (36.9 C) (02/22 0606) Pulse Rate:  [85-97] 87  (02/22 0606) Resp:  [20] 20  (02/22 0606) BP: (108-118)/(72-74) 109/73 mmHg (02/22 0606) SpO2:  [94 %-97 %] 94 % (02/22 0606) Last BM Date: 05/06/11 General:   Alert,  Well-developed, well-nourished, pleasant and cooperative in NAD Head:  Normocephalic and atraumatic. Eyes:  Sclera clear, no icterus.   Abdomen:  Soft, moderate epigastric and ruq tenderness. Nondistended.   Normal bowel sounds, without guarding, and without rebound.   Extremities:  Without clubbing, deformity or edema. Neurologic:  Alert and  oriented x4;  grossly normal neurologically. Skin:  Intact without significant lesions or rashes. Psych:  Alert and cooperative. Normal mood and affect.  Intake/Output from previous day: 02/21 0701 - 02/22 0700 In: 360 [P.O.:360] Out: -  Intake/Output this shift: Total I/O In: 545 [P.O.:545] Out: -   Lab Results: CBC  Basename 05/12/11 0638  WBC 3.8*  HGB 12.2  HCT 37.5  MCV 86.8  PLT 286   BMET  Basename 05/13/11 0530 05/12/11 0638  NA 140 140  K 3.2* 3.5  CL 104 104  CO2 27 27  GLUCOSE 98 91  BUN 4* 4*  CREATININE 0.67 0.65  CALCIUM 9.8 10.2   LFTs  Basename 05/14/11 0458 05/13/11 0530 05/12/11 0638 05/11/11 1105  BILITOT 0.3 0.4 0.3 --  BILIDIR <0.1 -- -- 0.1  IBILI NOT CALCULATED -- -- 0.3  ALKPHOS 97 108 114 --  AST 33 45* 55* --  ALT 26 34 39* --  PROT 6.1 6.6 6.8 --  ALBUMIN 3.2* 3.5 3.7 --     PT/INR  Basename 05/13/11 0530  LABPROT 14.4  INR 1.10      Assessment: Partial GOO secondary to duodenal stricture likely secondary to ongoing NSAID/ASA use. Patient continues to complain of significant  epigastric pain and eating/drinking very little.  New transaminitis improving. H/O chronic pancreatic duct dilation. No CBD dilation on current CT. LFTs now normal.  Plan: 1. Continue PPI BID and one week course of carafate slurries. 2. To discuss with Dr. 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 May need UGI series. 3. Consider outpatient EUS for chronic pancreatic duct dilation, ?chronic pancreatitis, elevated LFTs. 4. OV in 3-4 weeks.   LOS: 4 days   Tana Coast  05/14/2011, 8:38 AM   Spoke with radiology department (KIM). Dr. Corky Downs does not want to perform UGI today since patient had some breakfast. Would not be done until Monday. Resumed her diet.

## 2011-05-14 NOTE — Progress Notes (Signed)
Has had some difficulties with solid food. She may have an ongoing partial gastric outlet obstruction, however, I feel her abdominal pain is somewhat out of proportion to objective findings at the time of her recent EGD.  She certainly could have 2 processes (we need to keep in mind she's had a mild transaminitis  as well as a dilated common hepatic duct on MRCP previously-somewhat more dilated than that typically seen status post cholecystectomy). As previously recommended, these abnormalities should be followed up with an endoscopic ultrasound in the coming weeks.  For now, let's see how she does with a full liquid, low residue diet with her total daily caloric intake broken up into 5 snacks throughout the day.  Would minimize narcotics as much as possible.  Will see her over the weekend. If needed, we will pursue an upper GI series  February 25.

## 2011-05-15 LAB — BASIC METABOLIC PANEL
BUN: 3 mg/dL — ABNORMAL LOW (ref 6–23)
CO2: 30 mEq/L (ref 19–32)
Calcium: 9.4 mg/dL (ref 8.4–10.5)
Creatinine, Ser: 0.6 mg/dL (ref 0.50–1.10)
GFR calc Af Amer: >90 mL/min (ref >90)

## 2011-05-15 MED ORDER — SODIUM CHLORIDE 0.9 % IJ SOLN
INTRAMUSCULAR | Status: AC
  Administered 2011-05-15: 3 mL
  Filled 2011-05-15: qty 3

## 2011-05-15 MED ORDER — HYOSCYAMINE SULFATE ER 0.375 MG PO TB12
0.3750 mg | ORAL_TABLET | Freq: Two times a day (BID) | ORAL | Status: DC
  Administered 2011-05-15 – 2011-05-18 (×7): 0.375 mg via ORAL
  Filled 2011-05-15 (×9): qty 1

## 2011-05-15 MED ORDER — POTASSIUM CHLORIDE 10 MEQ/100ML IV SOLN
10.0000 meq | INTRAVENOUS | Status: AC
  Filled 2011-05-15: qty 100

## 2011-05-15 NOTE — Progress Notes (Signed)
Subjective: Having severe pain in epigastrium after eating, had vomiting last night after dinner, did not try breakfast this morning.  Objective: Vital signs in last 24 hours: Temp:  [97.4 F (36.3 C)-98.8 F (37.1 C)] 98.8 F (37.1 C) (02/23 0600) Pulse Rate:  [86-96] 96  (02/23 0600) Resp:  [14-20] 14  (02/23 0600) BP: (108-129)/(75-83) 112/77 mmHg (02/23 0600) SpO2:  [92 %-97 %] 92 % (02/23 0600) Weight change:  Last BM Date: 05/06/11  Intake/Output from previous day: 02/22 0701 - 02/23 0700 In: 1029 [P.O.:1025; IV Piggyback:4] Out: 1300 [Urine:1300]     Physical Exam: General: Alert, awake, oriented x3, in no acute distress. HEENT: No bruits, no goiter. Heart: Regular rate and rhythm, without murmurs, rubs, gallops. Lungs: Clear to auscultation bilaterally. Abdomen: Soft, tender in epigastrium, nondistended, positive bowel sounds. Extremities: No clubbing cyanosis or edema with positive pedal pulses. Neuro: Grossly intact, nonfocal.    Lab Results: Basic Metabolic Panel:  Basename 05/15/11 0635 05/13/11 0530  NA 139 140  K 3.2* 3.2*  CL 100 104  CO2 30 27  GLUCOSE 96 98  BUN 3* 4*  CREATININE 0.60 0.67  CALCIUM 9.4 9.8  MG -- --  PHOS -- --   Liver Function Tests:  Basename 05/14/11 0458 05/13/11 0530  AST 33 45*  ALT 26 34  ALKPHOS 97 108  BILITOT 0.3 0.4  PROT 6.1 6.6  ALBUMIN 3.2* 3.5   No results found for this basename: LIPASE:2,AMYLASE:2 in the last 72 hours No results found for this basename: AMMONIA:2 in the last 72 hours CBC: No results found for this basename: WBC:2,NEUTROABS:2,HGB:2,HCT:2,MCV:2,PLT:2 in the last 72 hours Cardiac Enzymes: No results found for this basename: CKTOTAL:3,CKMB:3,CKMBINDEX:3,TROPONINI:3 in the last 72 hours BNP: No results found for this basename: PROBNP:3 in the last 72 hours D-Dimer: No results found for this basename: DDIMER:2 in the last 72 hours CBG: No results found for this basename: GLUCAP:6 in the  last 72 hours Hemoglobin A1C: No results found for this basename: HGBA1C in the last 72 hours Fasting Lipid Panel: No results found for this basename: CHOL,HDL,LDLCALC,TRIG,CHOLHDL,LDLDIRECT in the last 72 hours Thyroid Function Tests: No results found for this basename: TSH,T4TOTAL,FREET4,T3FREE,THYROIDAB in the last 72 hours Anemia Panel: No results found for this basename: VITAMINB12,FOLATE,FERRITIN,TIBC,IRON,RETICCTPCT in the last 72 hours Coagulation:  Basename 05/13/11 0530  LABPROT 14.4  INR 1.10   Urine Drug Screen: Drugs of Abuse  No results found for this basename: labopia, cocainscrnur, labbenz, amphetmu, thcu, labbarb    Alcohol Level: No results found for this basename: ETH:2 in the last 72 hours Urinalysis: No results found for this basename: COLORURINE:2,APPERANCEUR:2,LABSPEC:2,PHURINE:2,GLUCOSEU:2,HGBUR:2,BILIRUBINUR:2,KETONESUR:2,PROTEINUR:2,UROBILINOGEN:2,NITRITE:2,LEUKOCYTESUR:2 in the last 72 hours  Recent Results (from the past 240 hour(s))  URINE CULTURE     Status: Normal   Collection Time   05/10/11 10:28 PM      Component Value Range Status Comment   Specimen Description URINE, CLEAN CATCH   Final    Special Requests NONE   Final    Culture  Setup Time [id number]   Final    Colony Count 75,000 COLONIES/ML   Final    Culture ESCHERICHIA COLI   Final    Report Status 05/13/2011 FINAL   Final    Organism ID, Bacteria ESCHERICHIA COLI   Final   MRSA PCR SCREENING     Status: Normal   Collection Time   05/11/11  2:08 AM      Component Value Range Status Comment   MRSA by PCR NEGATIVE  NEGATIVE  Final     Studies/Results: No results found.  Medications: Scheduled Meds:   . amLODipine  5 mg Oral Daily  . ciprofloxacin  250 mg Oral BID  . LORazepam  0.5 mg Oral BID  . pantoprazole  40 mg Oral BID AC  . QUEtiapine  100 mg Oral BID  . sodium chloride      . sucralfate  1 g Oral TID WC & HS   Continuous Infusions:  PRN Meds:.acetaminophen,  acetaminophen, albuterol, HYDROmorphone, metoprolol, ondansetron (ZOFRAN) IV, ondansetron, oxyCODONE  Assessment/Plan:  1. Duodenal stricture, status post dilation. Patient now has recurrence of symptoms, will possibly need upper GI series, will change back to liquid diet until this can be performed. She was advised to continue with small frequent meals.  Will add levsin for any element of bowel spasm. She is unable to tolerate any po at present. She is continued on BID PPI and carafate. Appreciate GI assistance.  2. E coli UTI. Started on oral cipro, will complete 5 day course, currently day 3  3. Hypertension. Controlled on meds.  4. Elevated LFTS, resolved. 5. Hypokalemia, replete   LOS: 5 days   Jordan Haney [facility] Pager: [phone] 05/15/2011, 10:23 AM

## 2011-05-16 DIAGNOSIS — R112 Nausea with vomiting, unspecified: Secondary | ICD-10-CM

## 2011-05-16 DIAGNOSIS — R109 Unspecified abdominal pain: Secondary | ICD-10-CM

## 2011-05-16 LAB — BASIC METABOLIC PANEL
Calcium: 9.7 mg/dL (ref 8.4–10.5)
Creatinine, Ser: 0.53 mg/dL (ref 0.50–1.10)
GFR calc non Af Amer: >90 mL/min (ref >90)
Glucose, Bld: 109 mg/dL — ABNORMAL HIGH (ref 70–99)
Sodium: 137 mEq/L (ref 135–145)

## 2011-05-16 MED ORDER — SODIUM CHLORIDE 0.9 % IJ SOLN
INTRAMUSCULAR | Status: AC
  Administered 2011-05-16: 3 mL
  Filled 2011-05-16: qty 3

## 2011-05-16 MED ORDER — HYDROMORPHONE HCL PF 1 MG/ML IJ SOLN
1.0000 mg | INTRAMUSCULAR | Status: DC | PRN
  Administered 2011-05-16 – 2011-05-18 (×3): 1 mg via INTRAVENOUS
  Filled 2011-05-16 (×3): qty 1

## 2011-05-16 MED ORDER — OXYCODONE HCL 5 MG PO TABS
5.0000 mg | ORAL_TABLET | ORAL | Status: DC | PRN
  Administered 2011-05-16: 10 mg via ORAL
  Administered 2011-05-16 (×2): 5 mg via ORAL
  Administered 2011-05-17 – 2011-05-18 (×7): 10 mg via ORAL
  Administered 2011-05-18: 5 mg via ORAL
  Administered 2011-05-18: 10 mg via ORAL
  Filled 2011-05-16 (×2): qty 1
  Filled 2011-05-16: qty 2
  Filled 2011-05-16: qty 1
  Filled 2011-05-16 (×7): qty 2
  Filled 2011-05-16: qty 1
  Filled 2011-05-16: qty 2

## 2011-05-16 MED ORDER — ONDANSETRON HCL 4 MG/2ML IJ SOLN
4.0000 mg | Freq: Four times a day (QID) | INTRAMUSCULAR | Status: DC
  Administered 2011-05-17: 4 mg via INTRAVENOUS
  Filled 2011-05-16 (×2): qty 2

## 2011-05-16 MED ORDER — SODIUM CHLORIDE 0.9 % IJ SOLN
INTRAMUSCULAR | Status: AC
  Filled 2011-05-16: qty 3

## 2011-05-16 MED ORDER — ONDANSETRON HCL 4 MG PO TABS
4.0000 mg | ORAL_TABLET | Freq: Four times a day (QID) | ORAL | Status: DC
  Administered 2011-05-16 – 2011-05-19 (×11): 4 mg via ORAL
  Filled 2011-05-16 (×5): qty 1
  Filled 2011-05-16: qty 2
  Filled 2011-05-16 (×6): qty 1

## 2011-05-16 NOTE — Progress Notes (Signed)
Went into patients room to reassess pain after giving ordered pain medication. Patient stated "not good" when asked how her pain was. When asked if I could get her anything patient stated "no" without looking. Will continue to assess.

## 2011-05-16 NOTE — Progress Notes (Signed)
Subjective: Still reports vomiting and abd pain with minimal po intake  Objective: Vital signs in last 24 hours: Temp:  [98.5 F (36.9 C)-99.4 F (37.4 C)] 99.4 F (37.4 C) (02/24 0556) Pulse Rate:  [94-108] 103  (02/24 0556) Resp:  [17-20] 18  (02/24 0556) BP: (126-151)/(84-102) 126/84 mmHg (02/24 0556) SpO2:  [90 %-94 %] 90 % (02/24 0556) Weight change:  Last BM Date: 05/06/11  Intake/Output from previous day: 02/23 0701 - 02/24 0700 In: 600 [P.O.:600] Out: 1600 [Urine:1600] Total I/O In: 120 [P.O.:120] Out: -    Physical Exam: General: Alert, awake, oriented x3, in no acute distress. HEENT: No bruits, no goiter. Heart: Regular rate and rhythm, without murmurs, rubs, gallops. Lungs: Clear to auscultation bilaterally. Abdomen: Soft, tender in epigastrium, nondistended, positive bowel sounds. Extremities: No clubbing cyanosis or edema with positive pedal pulses. Neuro: Grossly intact, nonfocal.    Lab Results: Basic Metabolic Panel:  Basename 05/16/11 0625 05/15/11 0635  NA 137 139  K 3.3* 3.2*  CL 99 100  CO2 30 30  GLUCOSE 109* 96  BUN 5* 3*  CREATININE 0.53 0.60  CALCIUM 9.7 9.4  MG -- --  PHOS -- --   Liver Function Tests:  Basename 05/14/11 0458  AST 33  ALT 26  ALKPHOS 97  BILITOT 0.3  PROT 6.1  ALBUMIN 3.2*   No results found for this basename: LIPASE:2,AMYLASE:2 in the last 72 hours No results found for this basename: AMMONIA:2 in the last 72 hours CBC: No results found for this basename: WBC:2,NEUTROABS:2,HGB:2,HCT:2,MCV:2,PLT:2 in the last 72 hours Cardiac Enzymes: No results found for this basename: CKTOTAL:3,CKMB:3,CKMBINDEX:3,TROPONINI:3 in the last 72 hours BNP: No results found for this basename: PROBNP:3 in the last 72 hours D-Dimer: No results found for this basename: DDIMER:2 in the last 72 hours CBG: No results found for this basename: GLUCAP:6 in the last 72 hours Hemoglobin A1C: No results found for this basename: HGBA1C  in the last 72 hours Fasting Lipid Panel: No results found for this basename: CHOL,HDL,LDLCALC,TRIG,CHOLHDL,LDLDIRECT in the last 72 hours Thyroid Function Tests: No results found for this basename: TSH,T4TOTAL,FREET4,T3FREE,THYROIDAB in the last 72 hours Anemia Panel: No results found for this basename: VITAMINB12,FOLATE,FERRITIN,TIBC,IRON,RETICCTPCT in the last 72 hours Coagulation: No results found for this basename: LABPROT:2,INR:2 in the last 72 hours Urine Drug Screen: Drugs of Abuse  No results found for this basename: labopia, cocainscrnur, labbenz, amphetmu, thcu, labbarb    Alcohol Level: No results found for this basename: ETH:2 in the last 72 hours Urinalysis: No results found for this basename: COLORURINE:2,APPERANCEUR:2,LABSPEC:2,PHURINE:2,GLUCOSEU:2,HGBUR:2,BILIRUBINUR:2,KETONESUR:2,PROTEINUR:2,UROBILINOGEN:2,NITRITE:2,LEUKOCYTESUR:2 in the last 72 hours  Recent Results (from the past 240 hour(s))  URINE CULTURE     Status: Normal   Collection Time   05/10/11 10:28 PM      Component Value Range Status Comment   Specimen Description URINE, CLEAN CATCH   Final    Special Requests NONE   Final    Culture  Setup Time [id number]   Final    Colony Count 75,000 COLONIES/ML   Final    Culture ESCHERICHIA COLI   Final    Report Status 05/13/2011 FINAL   Final    Organism ID, Bacteria ESCHERICHIA COLI   Final   MRSA PCR SCREENING     Status: Normal   Collection Time   05/11/11  2:08 AM      Component Value Range Status Comment   MRSA by PCR NEGATIVE  NEGATIVE  Final     Studies/Results: No results found.  Medications: Scheduled Meds:   . amLODipine  5 mg Oral Daily  . ciprofloxacin  250 mg Oral BID  . hyoscyamine  0.375 mg Oral Q12H  . LORazepam  0.5 mg Oral BID  . pantoprazole  40 mg Oral BID AC  . potassium chloride  10 mEq Intravenous Q1 Hr x 4  . QUEtiapine  100 mg Oral BID  . sodium chloride      . sodium chloride      . sodium chloride      . sodium  chloride      . sucralfate  1 g Oral TID WC & HS   Continuous Infusions:  PRN Meds:.acetaminophen, acetaminophen, albuterol, HYDROmorphone, metoprolol, ondansetron (ZOFRAN) IV, ondansetron, oxyCODONE  Assessment/Plan:  1. Duodenal stricture, status post dilation. Patient has recurrence of symptoms, will try and get upper GI series in am. She was advised to continue with small frequent meals. She is unable to tolerate any po at present. She is continued on BID PPI and carafate. Appreciate GI assistance. She has been complaining of vomiting, but this has not been witnessed by staff.  Will ask that staff to keep basin in patient's room to assess patient's vomitus.  Will place on scheduled zofran.  She was advised to try oral pain meds, as opposed to IV. 2. E coli UTI. Started on oral cipro, will complete 5 day course, currently day 4  3. Hypertension. Controlled on meds.  4. Elevated LFTS, resolved.  5. Hypokalemia, replete    LOS: 6 days   Larone Kliethermes [facility] Pager: [phone] 05/16/2011, 11:12 AM

## 2011-05-16 NOTE — Progress Notes (Signed)
Called into patients room at the request for pain medication. Brought patient ordered Oxy IR. Patient refused pill stating she "wasnt playing these games." Asked if I had talked to Dr. Jena Gauss because according to the patient he had said she could have the IV pain medication. Paged Dr. Jena Gauss and asked if it was okay to give IV medication. MD stated give her a dose now but tell her Dr. Jena Gauss said to try to utilize the pills in the future. Patient stated understanding and accepted the IV medication stating that the pills take too long to work and she cannot feel the relief right away. Will continue to reassess patients pain.

## 2011-05-16 NOTE — Progress Notes (Signed)
Subjective: Says abdominal pain worse today. OxyIR not working. She requests a dose of parenteral analgesics. No diarrhea.  No vomiting.  Objective: Vital signs in last 24 hours: Temp:  [98.5 F (36.9 C)-99.4 F (37.4 C)] 99.4 F (37.4 C) (02/24 0556) Pulse Rate:  [94-108] 103  (02/24 0556) Resp:  [17-20] 18  (02/24 0556) BP: (126-151)/(84-102) 126/84 mmHg (02/24 0556) SpO2:  [90 %-94 %] 90 % (02/24 0556) Last BM Date: 05/06/11 General:   Awake conversant; keeps eyes closed during my encounter with her today Abdomen:  Flat positive bowel symptoms mild diffuse tenderness to palpation. No rebound or guarding Extremities:  Without clubbing or edema.    Intake/Output from previous day: 02/23 0701 - 02/24 0700 In: 600 [P.O.:600] Out: 1600 [Urine:1600] Intake/Output this shift: Total I/O In: 120 [P.O.:120] Out: -   Basename 05/16/11 0625 05/15/11 0635  NA 137 139  K 3.3* 3.2*  CL 99 100  CO2 30 30  GLUCOSE 109* 96  BUN 5* 3*  CREATININE 0.53 0.60  CALCIUM 9.7 9.4   LFT  Basename 05/14/11 0458  PROT 6.1  ALBUMIN 3.2*  AST 33  ALT 26  ALKPHOS 97  BILITOT 0.3  BILIDIR <0.1  IBILI NOT CALCULATED   PT/INR No results found for this basename: LABPROT:2,INR:2 in the last 72 hours Hepatitis Panel  Basename 05/13/11 1210  HEPBSAG NEGATIVE  HCVAB NEGATIVE  HEPAIGM --  HEPBIGM --   Impression: Ongoing GI symptoms. Somewhat out of proportion to objective findings.   Recommendations: Single dose of Dilaudud 1 milligram IV now.; Otherwise, I have recommended to the patient that             she utilized OxyIR as her primary analgesic agent for now.            Upper GI series tomorrow. Outpatient EUS to be scheduled.     LOS: 6 days   Eula Listen  05/16/2011, 11:13 AM

## 2011-05-16 NOTE — Progress Notes (Signed)
Patient called to desk requesting pain medication. Took Oxy IR and Zofran PO along with scheduled carafate to patients room. Explained what medications I had for her. Patient stated "I just threw up. I cannot take the pills".patient also stated that she had talked with the doctor and he said if she could not tolerate the pills then she could have the shot. Patient wanted me to call the doctor and explain the situation stating that she wanted the "shot" and that she "would not take the pills-pointblank". Explained situation to Dr. Kerry Hough and was encouraged to continue the pills. Reinforced to patient the plan at this point including that when she vomits, it must be in a basin at the bedside. Patient explained that she wished to see Dr. Jena Gauss. Told her MD was currently rounding and would be by shortly.

## 2011-05-16 NOTE — Progress Notes (Signed)
Patient's IV was due to be changed. Patient was asked if RN could put a new IV in. Patient refused. IV site has no signs of infection.

## 2011-05-17 ENCOUNTER — Ambulatory Visit (HOSPITAL_COMMUNITY)

## 2011-05-17 DIAGNOSIS — K315 Obstruction of duodenum: Secondary | ICD-10-CM

## 2011-05-17 LAB — CBC
MCH: 29 pg (ref 26.0–34.0)
Platelets: 380 10*3/uL (ref 150–400)
RBC: 4.49 MIL/uL (ref 3.87–5.11)
RDW: 15 % (ref 11.5–15.5)

## 2011-05-17 LAB — COMPREHENSIVE METABOLIC PANEL
ALT: 14 U/L (ref 0–35)
AST: 16 U/L (ref 0–37)
Albumin: 3.3 g/dL — ABNORMAL LOW (ref 3.5–5.2)
CO2: 32 mEq/L (ref 19–32)
Calcium: 10.1 mg/dL (ref 8.4–10.5)
Sodium: 138 mEq/L (ref 135–145)
Total Protein: 6.9 g/dL (ref 6.0–8.3)

## 2011-05-17 MED ORDER — SODIUM CHLORIDE 0.9 % IJ SOLN
INTRAMUSCULAR | Status: AC
  Administered 2011-05-17: 3 mL
  Filled 2011-05-17: qty 3

## 2011-05-17 NOTE — Progress Notes (Signed)
CARE MANAGEMENT NOTE 05/17/2011  Patient:  Jordan Haney, Jordan Haney   Account Number:  [id number]  Date Initiated:  05/17/2011  Documentation initiated by:  Rosemary Holms  Subjective/Objective Assessment:   Pt admitted from home with abdominal pain.     Action/Plan:   Plan to dc back home. No HH needs identified.   Anticipated DC Date:  05/19/2011   Anticipated DC Plan:  HOME/SELF CARE      DC Planning Services  CM consult      Choice offered to / List presented to:             Status of service:  In process, will continue to follow Medicare Important Message given?   (If response is "NO", the following Medicare IM given date fields will be blank) Date Medicare IM given:   Date Additional Medicare IM given:    Discharge Disposition:    Per UR Regulation:    Comments:  05/17/11 1100 Xavion Muscat Leanord Hawking RN BSN

## 2011-05-17 NOTE — Progress Notes (Signed)
Subjective: Constant epigastric pain, worse with eating. Improved slightly since EGD. Severe nausea, vomiting X 2 yesterday, mostly dry heaves. All she can tolerate is jello, soup, small amounts at a time. UGi series today.   Objective: Vital signs in last 24 hours: Temp:  [98.7 F (37.1 C)-99.5 F (37.5 C)] 98.7 F (37.1 C) (02/25 0524) Pulse Rate:  [105-118] 105  (02/25 0524) Resp:  [16-18] 16  (02/25 0524) BP: (102-117)/(70-80) 102/70 mmHg (02/25 0524) SpO2:  [91 %-94 %] 93 % (02/25 0524) Last BM Date: 05/06/11 General:   Alert and oriented, flat affect.  Head:  Normocephalic and atraumatic. Eyes:  No icterus, sclera clear. Conjuctiva pink.  Heart:  S1, S2 present, no murmurs noted.  Lungs: Clear to auscultation bilaterally, without wheezing, rales, or rhonchi.  Abdomen:  Bowel sounds present, soft, TTP above umbilicus, at surgical scar. non-distended. No HSM or hernias noted. No rebound or guarding. No masses appreciated  Msk:  Symmetrical without gross deformities. Normal posture. Extremities:  Without clubbing or edema. Neurologic:  Alert and  oriented x4;  grossly normal neurologically. Skin:  Warm and dry, intact without significant lesions.  Psych:  Alert, flat affect.   Intake/Output from previous day: 02/24 0701 - 02/25 0700 In: 1320 [P.O.:1320] Out: 1350 [Urine:1350] Intake/Output this shift: Total I/O In: -  Out: 450 [Urine:450]  Lab Results:  [facility] 05/17/11 0525  WBC 7.0  HGB 13.0  HCT 39.3  PLT 380   BMET  Basename 05/17/11 0525 05/16/11 0625 05/15/11 0635  NA 138 137 139  K 3.3* 3.3* 3.2*  CL 97 99 100  CO2 32 30 30  GLUCOSE 102* 109* 96  BUN 6 5* 3*  CREATININE 0.72 0.53 0.60  CALCIUM 10.1 9.7 9.4   LFT  Basename 05/17/11 0525  PROT 6.9  ALBUMIN 3.3*  AST 16  ALT 14  ALKPHOS 110  BILITOT 0.6  BILIDIR --  IBILI --   Assessment: 56 year old female with epigastric pain, N/V. EGD with duodenal stricture, s/p dilation. LFTs  normalized. Abdominal complaints appear somewhat out of proportion to findings. Will pursue UGI today to assess for any ongoing stricture. Needs EUS as outpatient.   Plan: Supportive measures UGI today EUS as outpatient Further recommendations to follow    LOS: 7 days   Gerrit Halls  05/17/2011, 9:15 AM

## 2011-05-17 NOTE — Progress Notes (Signed)
Subjective: Still having abd pain and vomiting while eating.  Patient went for UGI today, but was told that the contrast from her CT scan done on 2/18 had not passed.  Therefore, study was not done.  Objective: Vital signs in last 24 hours: Temp:  [98.6 F (37 C)-99.5 F (37.5 C)] 98.6 F (37 C) (02/25 1418) Pulse Rate:  [97-114] 97  (02/25 1418) Resp:  [16-18] 18  (02/25 1418) BP: (102-117)/(70-80) 102/73 mmHg (02/25 1418) SpO2:  [92 %-94 %] 92 % (02/25 1418) Weight change:  Last BM Date: 05/06/11  Intake/Output from previous day: 02/24 0701 - 02/25 0700 In: 1320 [P.O.:1320] Out: 1350 [Urine:1350] Total I/O In: -  Out: 450 [Urine:450]   Physical Exam: General: Alert, awake, oriented x3, in no acute distress. HEENT: No bruits, no goiter. Heart: Regular rate and rhythm, without murmurs, rubs, gallops. Lungs: Clear to auscultation bilaterally. Abdomen: Soft, tender in epigastrium, nondistended, positive bowel sounds. Extremities: No clubbing cyanosis or edema with positive pedal pulses. Neuro: Grossly intact, nonfocal.    Lab Results: Basic Metabolic Panel:  Basename 05/17/11 0525 05/16/11 0625  NA 138 137  K 3.3* 3.3*  CL 97 99  CO2 32 30  GLUCOSE 102* 109*  BUN 6 5*  CREATININE 0.72 0.53  CALCIUM 10.1 9.7  MG -- --  PHOS -- --   Liver Function Tests:  Basename 05/17/11 0525  AST 16  ALT 14  ALKPHOS 110  BILITOT 0.6  PROT 6.9  ALBUMIN 3.3*   No results found for this basename: LIPASE:2,AMYLASE:2 in the last 72 hours No results found for this basename: AMMONIA:2 in the last 72 hours CBC:  Basename 05/17/11 0525  WBC 7.0  NEUTROABS --  HGB 13.0  HCT 39.3  MCV 87.5  PLT 380   Cardiac Enzymes: No results found for this basename: CKTOTAL:3,CKMB:3,CKMBINDEX:3,TROPONINI:3 in the last 72 hours BNP: No results found for this basename: PROBNP:3 in the last 72 hours D-Dimer: No results found for this basename: DDIMER:2 in the last 72 hours CBG: No  results found for this basename: GLUCAP:6 in the last 72 hours Hemoglobin A1C: No results found for this basename: HGBA1C in the last 72 hours Fasting Lipid Panel: No results found for this basename: CHOL,HDL,LDLCALC,TRIG,CHOLHDL,LDLDIRECT in the last 72 hours Thyroid Function Tests: No results found for this basename: TSH,T4TOTAL,FREET4,T3FREE,THYROIDAB in the last 72 hours Anemia Panel: No results found for this basename: VITAMINB12,FOLATE,FERRITIN,TIBC,IRON,RETICCTPCT in the last 72 hours Coagulation: No results found for this basename: LABPROT:2,INR:2 in the last 72 hours Urine Drug Screen: Drugs of Abuse  No results found for this basename: labopia, cocainscrnur, labbenz, amphetmu, thcu, labbarb    Alcohol Level: No results found for this basename: ETH:2 in the last 72 hours Urinalysis: No results found for this basename: COLORURINE:2,APPERANCEUR:2,LABSPEC:2,PHURINE:2,GLUCOSEU:2,HGBUR:2,BILIRUBINUR:2,KETONESUR:2,PROTEINUR:2,UROBILINOGEN:2,NITRITE:2,LEUKOCYTESUR:2 in the last 72 hours  Recent Results (from the past 240 hour(s))  URINE CULTURE     Status: Normal   Collection Time   05/10/11 10:28 PM      Component Value Range Status Comment   Specimen Description URINE, CLEAN CATCH   Final    Special Requests NONE   Final    Culture  Setup Time [id number]   Final    Colony Count 75,000 COLONIES/ML   Final    Culture ESCHERICHIA COLI   Final    Report Status 05/13/2011 FINAL   Final    Organism ID, Bacteria ESCHERICHIA COLI   Final   MRSA PCR SCREENING     Status: Normal  Collection Time   05/11/11  2:08 AM      Component Value Range Status Comment   MRSA by PCR NEGATIVE  NEGATIVE  Final     Studies/Results: Dg Abd 1 View  05/17/2011  *RADIOLOGY REPORT*  Clinical Data: Abdominal pain and persistent nausea.  ABDOMEN - 1 VIEW  AP view of the abdomen  Comparison: CT scan of the abdomen dated 05/11/2011  Findings: There is extensive contrast in the right side of the colon as  well as some stool and contrast in the distal colon. There is moderate air in the nondistended colon.  Clips consistent with prior cholecystectomy.  Old compression fracture of T12.  IMPRESSION: Extensive residual contrast in the colon from prior CT scan. This precludes performance of a satisfactory upper GI at this time.  Original Report Authenticated By: Gwynn Burly, M.D.    Medications: Scheduled Meds:   . amLODipine  5 mg Oral Daily  . ciprofloxacin  250 mg Oral BID  . hyoscyamine  0.375 mg Oral Q12H  . LORazepam  0.5 mg Oral BID  . ondansetron (ZOFRAN) IV  4 mg Intravenous Q6H   Or  . ondansetron  4 mg Oral Q6H  . pantoprazole  40 mg Oral BID AC  . QUEtiapine  100 mg Oral BID  . sodium chloride      . sucralfate  1 g Oral TID WC & HS   Continuous Infusions:  PRN Meds:.acetaminophen, acetaminophen, albuterol, HYDROmorphone, metoprolol, oxyCODONE  Assessment/Plan:  1. Duodenal stricture, status post dilation. Patient has recurrence of symptoms, UGI was attempted today, but was canceled since patient had residual contrast from CT scan.  Not sure if this is a motility problem or structural, since pt did have a recent dilation.  ?need for repeat EGD with further dilation?  Defer to GI.  Agree with trying to limit narcotics, can add reglan if motility is the main problem.  2. E coli UTI. Started on oral cipro, will complete 5 day course, currently day 5, will d/c today   3. Hypertension. Controlled on meds.   4. Elevated LFTS, resolved.   5. Hypokalemia, replete    LOS: 7 days   Campbell Agramonte [facility] Pager: [phone] 05/17/2011, 3:49 PM

## 2011-05-18 MED ORDER — OXYCODONE HCL 5 MG PO TABS
5.0000 mg | ORAL_TABLET | Freq: Four times a day (QID) | ORAL | Status: DC | PRN
  Administered 2011-05-18 – 2011-05-19 (×2): 5 mg via ORAL
  Filled 2011-05-18: qty 1
  Filled 2011-05-18: qty 2

## 2011-05-18 MED ORDER — METOCLOPRAMIDE HCL 5 MG/ML IJ SOLN
10.0000 mg | Freq: Three times a day (TID) | INTRAMUSCULAR | Status: DC
Start: 2011-05-18 — End: 2011-05-20
  Administered 2011-05-18 – 2011-05-19 (×3): 10 mg via INTRAVENOUS
  Filled 2011-05-18: qty 6
  Filled 2011-05-18 (×2): qty 2

## 2011-05-18 MED ORDER — POLYETHYLENE GLYCOL 3350 17 G PO PACK
17.0000 g | PACK | Freq: Once | ORAL | Status: AC
  Administered 2011-05-18: 17 g via ORAL
  Filled 2011-05-18: qty 1

## 2011-05-18 MED ORDER — POLYETHYLENE GLYCOL 3350 17 G PO PACK
17.0000 g | PACK | Freq: Every day | ORAL | Status: DC
  Administered 2011-05-18 – 2011-05-19 (×2): 17 g via ORAL
  Filled 2011-05-18 (×2): qty 1

## 2011-05-18 MED ORDER — ONDANSETRON HCL 4 MG/2ML IJ SOLN
4.0000 mg | Freq: Three times a day (TID) | INTRAMUSCULAR | Status: DC
  Administered 2011-05-18 – 2011-05-19 (×3): 4 mg via INTRAVENOUS
  Filled 2011-05-18 (×2): qty 2

## 2011-05-18 MED ORDER — HYDROMORPHONE HCL PF 1 MG/ML IJ SOLN
1.0000 mg | Freq: Four times a day (QID) | INTRAMUSCULAR | Status: DC | PRN
  Administered 2011-05-19: 1 mg via INTRAVENOUS
  Filled 2011-05-18: qty 1

## 2011-05-18 MED ORDER — POTASSIUM CHLORIDE 20 MEQ/15ML (10%) PO LIQD
40.0000 meq | Freq: Once | ORAL | Status: AC
  Administered 2011-05-18: 40 meq via ORAL
  Filled 2011-05-18: qty 30

## 2011-05-18 MED ORDER — SODIUM CHLORIDE 0.9 % IJ SOLN
INTRAMUSCULAR | Status: AC
  Administered 2011-05-18: 10 mL
  Filled 2011-05-18: qty 3

## 2011-05-18 MED ORDER — DOCUSATE SODIUM 100 MG PO CAPS
100.0000 mg | ORAL_CAPSULE | Freq: Two times a day (BID) | ORAL | Status: DC
  Administered 2011-05-18 (×2): 100 mg via ORAL
  Filled 2011-05-18 (×3): qty 1

## 2011-05-18 MED ORDER — ENSURE IMMUNE HEALTH PO LIQD
237.0000 mL | Freq: Four times a day (QID) | ORAL | Status: DC
  Administered 2011-05-18 – 2011-05-19 (×6): 237 mL via ORAL
  Filled 2011-05-18 (×4): qty 237

## 2011-05-18 NOTE — Progress Notes (Signed)
Subjective: No vomiting today, tolerating small amounts of liquids, still has significant abd pain requiring frequent pain medication, no bowel movements  Objective: Vital signs in last 24 hours: Temp:  [98.2 F (36.8 C)-98.7 F (37.1 C)] 98.5 F (36.9 C) (02/26 1440) Pulse Rate:  [90-103] 103  (02/26 1440) Resp:  [18-20] 20  (02/26 1440) BP: (103-121)/(70-76) 105/73 mmHg (02/26 1440) SpO2:  [94 %-97 %] 95 % (02/26 1440) Weight change:  Last BM Date: 05/06/11  Intake/Output from previous day: 02/25 0701 - 02/26 0700 In: -  Out: 1000 [Urine:1000] Total I/O In: 123 [P.O.:120; I.V.:3] Out: -    Physical Exam: General: Alert, awake, oriented x3, appears uncomfortable. HEENT: No bruits, no goiter. Heart: Regular rate and rhythm, without murmurs, rubs, gallops. Lungs: Clear to auscultation bilaterally. Abdomen: Soft, tender mostly in epigastrium, nondistended, positive bowel sounds. Extremities: No clubbing cyanosis or edema with positive pedal pulses. Neuro: Grossly intact, nonfocal.    Lab Results: Basic Metabolic Panel:  Basename 05/17/11 0525 05/16/11 0625  NA 138 137  K 3.3* 3.3*  CL 97 99  CO2 32 30  GLUCOSE 102* 109*  BUN 6 5*  CREATININE 0.72 0.53  CALCIUM 10.1 9.7  MG -- --  PHOS -- --   Liver Function Tests:  Basename 05/17/11 0525  AST 16  ALT 14  ALKPHOS 110  BILITOT 0.6  PROT 6.9  ALBUMIN 3.3*   No results found for this basename: LIPASE:2,AMYLASE:2 in the last 72 hours No results found for this basename: AMMONIA:2 in the last 72 hours CBC:  Basename 05/17/11 0525  WBC 7.0  NEUTROABS --  HGB 13.0  HCT 39.3  MCV 87.5  PLT 380   Cardiac Enzymes: No results found for this basename: CKTOTAL:3,CKMB:3,CKMBINDEX:3,TROPONINI:3 in the last 72 hours BNP: No results found for this basename: PROBNP:3 in the last 72 hours D-Dimer: No results found for this basename: DDIMER:2 in the last 72 hours CBG: No results found for this basename:  GLUCAP:6 in the last 72 hours Hemoglobin A1C: No results found for this basename: HGBA1C in the last 72 hours Fasting Lipid Panel: No results found for this basename: CHOL,HDL,LDLCALC,TRIG,CHOLHDL,LDLDIRECT in the last 72 hours Thyroid Function Tests: No results found for this basename: TSH,T4TOTAL,FREET4,T3FREE,THYROIDAB in the last 72 hours Anemia Panel: No results found for this basename: VITAMINB12,FOLATE,FERRITIN,TIBC,IRON,RETICCTPCT in the last 72 hours Coagulation: No results found for this basename: LABPROT:2,INR:2 in the last 72 hours Urine Drug Screen: Drugs of Abuse  No results found for this basename: labopia, cocainscrnur, labbenz, amphetmu, thcu, labbarb    Alcohol Level: No results found for this basename: ETH:2 in the last 72 hours Urinalysis: No results found for this basename: COLORURINE:2,APPERANCEUR:2,LABSPEC:2,PHURINE:2,GLUCOSEU:2,HGBUR:2,BILIRUBINUR:2,KETONESUR:2,PROTEINUR:2,UROBILINOGEN:2,NITRITE:2,LEUKOCYTESUR:2 in the last 72 hours  Recent Results (from the past 240 hour(s))  URINE CULTURE     Status: Normal   Collection Time   05/10/11 10:28 PM      Component Value Range Status Comment   Specimen Description URINE, CLEAN CATCH   Final    Special Requests NONE   Final    Culture  Setup Time [id number]   Final    Colony Count 75,000 COLONIES/ML   Final    Culture ESCHERICHIA COLI   Final    Report Status 05/13/2011 FINAL   Final    Organism ID, Bacteria ESCHERICHIA COLI   Final   MRSA PCR SCREENING     Status: Normal   Collection Time   05/11/11  2:08 AM      Component Value  Range Status Comment   MRSA by PCR NEGATIVE  NEGATIVE  Final     Studies/Results: Dg Abd 1 View  05/17/2011  *RADIOLOGY REPORT*  Clinical Data: Abdominal pain and persistent nausea.  ABDOMEN - 1 VIEW  AP view of the abdomen  Comparison: CT scan of the abdomen dated 05/11/2011  Findings: There is extensive contrast in the right side of the colon as well as some stool and contrast in  the distal colon. There is moderate air in the nondistended colon.  Clips consistent with prior cholecystectomy.  Old compression fracture of T12.  IMPRESSION: Extensive residual contrast in the colon from prior CT scan. This precludes performance of a satisfactory upper GI at this time.  Original Report Authenticated By: Gwynn Burly, M.D.    Medications: Scheduled Meds:   . amLODipine  5 mg Oral Daily  . docusate sodium  100 mg Oral BID  . feeding supplement  237 mL Oral QID  . hyoscyamine  0.375 mg Oral Q12H  . LORazepam  0.5 mg Oral BID  . ondansetron (ZOFRAN) IV  4 mg Intravenous Q6H   Or  . ondansetron  4 mg Oral Q6H  . ondansetron (ZOFRAN) IV  4 mg Intravenous TID AC & HS  . pantoprazole  40 mg Oral BID AC  . polyethylene glycol  17 g Oral Daily  . polyethylene glycol  17 g Oral Once  . QUEtiapine  100 mg Oral BID  . sodium chloride      . sodium chloride      . sucralfate  1 g Oral TID WC & HS   Continuous Infusions:  PRN Meds:.acetaminophen, acetaminophen, albuterol, HYDROmorphone, metoprolol, oxyCODONE  Assessment/Plan:  1. Duodenal stricture, status post dilation. Patient has persistent pain and nausea. Vomiting appears to have improved.  UGIS was attempted but patient had residual contrast in her colon.  Possible that her symptoms are related to constipation.  Will give miralax tid, cont colace and add reglan. GI following, will need EUS to evaluate pancreatic duct. Decrease narcotic dosing. Ambulate patient.  2. E coli UTI. Completed 5 day course of cipro  3. Hypertension. Controlled on meds.   4. Elevated LFTS, resolved.   5. Hypokalemia, replete  6. Dispo.  Will plan on discharge home when abd pain improved and patient po intake is improving.  If the cause is her constipation then hopefully by tomorrow she can discharge home.   LOS: 8 days   Sheron Robin [facility] Pager: [phone] 05/18/2011, 5:06 PM

## 2011-05-18 NOTE — Progress Notes (Signed)
Subjective: More pain and pressure today. Feels epigastric area filling up with eating. No vomiting this morning. Ate oatmeal and tea. No nausea, just pain biggest thing. States "I feel like I'm going crazy". Unable to complete UGI due to retained contrast. No BM X 2 weeks.   Objective: Vital signs in last 24 hours: Temp:  [98.2 F (36.8 C)-98.7 F (37.1 C)] 98.2 F (36.8 C) (02/26 0547) Pulse Rate:  [90-97] 95  (02/26 0547) Resp:  [18] 18  (02/26 0547) BP: (102-121)/(70-76) 121/76 mmHg (02/26 1022) SpO2:  [92 %-97 %] 94 % (02/26 0547) Last BM Date: 05/06/11 General:   Alert and oriented, tearful Head:  Normocephalic and atraumatic. Eyes:  No icterus, sclera clear. Conjuctiva pink.  Mouth:  Without lesions, mucosa pink and moist.  Heart:  S1, S2 present, no murmurs noted.  Lungs: Clear to auscultation bilaterally, without wheezing, rales, or rhonchi.  Abdomen:  Bowel sounds present, soft, TTP epigastric region, pt flinches before even touching, non-distended. No HSM or hernias noted.  Msk:  Symmetrical without gross deformities. Normal posture. Extremities:  Without clubbing or edema. Neurologic:  Alert and  oriented x4;  grossly normal neurologically. Skin:  Warm and dry, intact without significant lesions.  Psych:  Anxious,tearful   Intake/Output from previous day: 02/25 0701 - 02/26 0700 In: -  Out: 1000 [Urine:1000] Intake/Output this shift: Total I/O In: 123 [P.O.:120; I.V.:3] Out: -   Lab Results:  [facility] 05/17/11 0525  WBC 7.0  HGB 13.0  HCT 39.3  PLT 380   BMET  Basename 05/17/11 0525 05/16/11 0625  NA 138 137  K 3.3* 3.3*  CL 97 99  CO2 32 30  GLUCOSE 102* 109*  BUN 6 5*  CREATININE 0.72 0.53  CALCIUM 10.1 9.7   LFT  Basename 05/17/11 0525  PROT 6.9  ALBUMIN 3.3*  AST 16  ALT 14  ALKPHOS 110  BILITOT 0.6  BILIDIR --  IBILI --     Studies/Results: Dg Abd 1 View  05/17/2011  *RADIOLOGY REPORT*  Clinical Data: Abdominal pain and  persistent nausea.  ABDOMEN - 1 VIEW  AP view of the abdomen  Comparison: CT scan of the abdomen dated 05/11/2011  Findings: There is extensive contrast in the right side of the colon as well as some stool and contrast in the distal colon. There is moderate air in the nondistended colon.  Clips consistent with prior cholecystectomy.  Old compression fracture of T12.  IMPRESSION: Extensive residual contrast in the colon from prior CT scan. This precludes performance of a satisfactory upper GI at this time.  Original Report Authenticated By: Gwynn Burly, M.D.    Assessment: 56 year old female with epigastric pain, N/V. 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 LFTs normalized. Abdominal complaints appear somewhat out of proportion to findings. UGI not completed due to extensive residual contrast in colon from prior CT scan.  Symptoms today mainly epigastric pain, N/V stable at this time. Noted no BM X 2 weeks. Hx of chronic constipation. Likely constipation plays somewhat of a role in chronic abdominal pain. However, does need further evaluation with EUS as outpatient due to bump in LFTs, dilation of pancreatic duct on CT at [facility] 02/2011.   Plan: ~Start Colace BID, Miralax now, repeat X 1 if necessary ~Miralax daily ~Continue PPI ~Hold off on UGI if pt continues to tolerate food ~Consider d/c with outpatient EUS as soon as possible, ?if EUS while inpatient would be beneficial. To discuss with Dr. Darrick Penna, on  call today    LOS: 8 days   Gerrit Halls  05/18/2011, 11:35 AM

## 2011-05-19 ENCOUNTER — Ambulatory Visit: Admitting: Gastroenterology

## 2011-05-19 DIAGNOSIS — K8689 Other specified diseases of pancreas: Secondary | ICD-10-CM

## 2011-05-19 DIAGNOSIS — K315 Obstruction of duodenum: Secondary | ICD-10-CM

## 2011-05-19 DIAGNOSIS — R7989 Other specified abnormal findings of blood chemistry: Secondary | ICD-10-CM

## 2011-05-19 LAB — BASIC METABOLIC PANEL
BUN: 5 mg/dL — ABNORMAL LOW (ref 6–23)
CO2: 31 mEq/L (ref 19–32)
Calcium: 9.4 mg/dL (ref 8.4–10.5)
Chloride: 102 mEq/L (ref 96–112)
Creatinine, Ser: 0.62 mg/dL (ref 0.50–1.10)
GFR calc Af Amer: >90 mL/min (ref >90)
GFR calc non Af Amer: >90 mL/min (ref >90)
Glucose, Bld: 97 mg/dL (ref 70–99)
Potassium: 3.4 mEq/L — ABNORMAL LOW (ref 3.5–5.1)
Sodium: 139 mEq/L (ref 135–145)

## 2011-05-19 LAB — MAGNESIUM: Magnesium: 1.7 mg/dL (ref 1.5–2.5)

## 2011-05-19 MED ORDER — ONDANSETRON HCL 4 MG/2ML IJ SOLN: 4.0000 mg | Freq: Three times a day (TID) | INTRAMUSCULAR | Status: DC

## 2011-05-19 MED ORDER — ONDANSETRON HCL 4 MG PO TABS
4.0000 mg | ORAL_TABLET | Freq: Three times a day (TID) | ORAL | Status: DC
  Administered 2011-05-19 (×2): 4 mg via ORAL
  Filled 2011-05-19 (×2): qty 1

## 2011-05-19 MED ORDER — SENNOSIDES-DOCUSATE SODIUM 8.6-50 MG PO TABS
2.0000 | ORAL_TABLET | Freq: Two times a day (BID) | ORAL | Status: DC
  Administered 2011-05-19 (×2): 2 via ORAL
  Filled 2011-05-19 (×2): qty 2

## 2011-05-19 MED ORDER — POTASSIUM CHLORIDE CRYS ER 20 MEQ PO TBCR
40.0000 meq | EXTENDED_RELEASE_TABLET | Freq: Two times a day (BID) | ORAL | Status: DC
  Administered 2011-05-19 (×2): 40 meq via ORAL
  Filled 2011-05-19 (×2): qty 2

## 2011-05-19 MED ORDER — OXYCODONE HCL 5 MG PO TABS
5.0000 mg | ORAL_TABLET | Freq: Four times a day (QID) | ORAL | Status: DC | PRN
  Administered 2011-05-19 – 2011-05-20 (×3): 5 mg via ORAL
  Filled 2011-05-19 (×3): qty 1

## 2011-05-19 MED ORDER — POLYETHYLENE GLYCOL 3350 17 G PO PACK
17.0000 g | PACK | Freq: Two times a day (BID) | ORAL | Status: DC
  Administered 2011-05-19: 17 g via ORAL
  Filled 2011-05-19: qty 1

## 2011-05-19 NOTE — Progress Notes (Signed)
Patient has had 2 formed BMs today. Agreed to continue taking meds included in new bowel regimen. Also patient's IV access was lost. Told patient I would restart but she refused. I explained that she would not be getting her reglan as ordered but patient continued to refuse new IV. MD notified

## 2011-05-19 NOTE — Progress Notes (Signed)
INITIAL ADULT NUTRITION ASSESSMENT Date: 05/19/2011   Time: 1:25 PM Reason for Assessment: Length of stay  ASSESSMENT: Female 56 y.o.  Patient stated appetite is normal. Patient is drinking the Ensure supplement 4 times a day. Patient does not want any snacks.   Dx: Nausea and vomiting  Hx:  Past Medical History  Diagnosis Date  . RA (rheumatoid arthritis)  2008  . MI (myocardial infarction) 2008  . Constipation 03/21/2011  . Hypokalemia 03/21/2011  . Fatty liver 03/21/2011  . Pancreatitis     states 3 years ago, very severe, ?biliary pancreatitis   . T12 compression fracture 2011  . Duodenal ulcer     remote per patient. +BC powders, patient report negative EGD six months ago   Related Meds:  Scheduled Meds:   . amLODipine  5 mg Oral Daily  . feeding supplement  237 mL Oral QID  . LORazepam  0.5 mg Oral BID  . metoCLOPramide (REGLAN) injection  10 mg Intravenous Q8H  . ondansetron  4 mg Oral TID AC & HS   Or  . ondansetron (ZOFRAN) IV  4 mg Intravenous TID AC & HS  . pantoprazole  40 mg Oral BID AC  . polyethylene glycol  17 g Oral Once  . polyethylene glycol  17 g Oral BID  . potassium chloride  40 mEq Oral Once  . potassium chloride  40 mEq Oral BID  . QUEtiapine  100 mg Oral BID  . senna-docusate  2 tablet Oral BID  . sucralfate  1 g Oral TID WC & HS  . DISCONTD: docusate sodium  100 mg Oral BID  . DISCONTD: hyoscyamine  0.375 mg Oral Q12H  . DISCONTD: ondansetron (ZOFRAN) IV  4 mg Intravenous Q6H  . DISCONTD: ondansetron (ZOFRAN) IV  4 mg Intravenous TID AC & HS  . DISCONTD: ondansetron  4 mg Oral Q6H  . DISCONTD: polyethylene glycol  17 g Oral Daily   Continuous Infusions:  PRN Meds:.acetaminophen, acetaminophen, albuterol, metoprolol, oxyCODONE, DISCONTD: HYDROmorphone, DISCONTD: HYDROmorphone, DISCONTD: oxyCODONE, DISCONTD: oxyCODONE  Ht: 5\' 4"  (162.6 cm)  Wt: 137 lb (62.143 kg)  Ideal Wt: 54.7 kg  % Ideal Wt: 114%  Body mass index is 23.52  kg/(m^2). WNL  Food/Nutrition Related Hx: unknown  Labs:  CMP     Component Value Date/Time   NA 139 05/19/2011 0537   K 3.4* 05/19/2011 0537   CL 102 05/19/2011 0537   CO2 31 05/19/2011 0537   GLUCOSE 97 05/19/2011 0537   BUN 5* 05/19/2011 0537   CREATININE 0.62 05/19/2011 0537   CALCIUM 9.4 05/19/2011 0537   PROT 6.9 05/17/2011 0525   ALBUMIN 3.3* 05/17/2011 0525   AST 16 05/17/2011 0525   ALT 14 05/17/2011 0525   ALKPHOS 110 05/17/2011 0525   BILITOT 0.6 05/17/2011 0525   GFRNONAA >90 05/19/2011 0537   GFRAA >90 05/19/2011 0537    Intake/Output Summary (Last 24 hours) at 05/19/11 1328 Last data filed at 05/19/11 1249  Gross per 24 hour  Intake    360 ml  Output    800 ml  Net   -440 ml    Diet Order: General  Supplements/Tube Feeding: Ensure Immune Health 4x's daily. Provides 1000 kcal and 36 grams of protein daily.   IVF:    Estimated Nutritional Needs:   Kcal: 1450-1750 Protein: 75-93 grams Fluid: minimum 1 ml per kcal  NUTRITION DIAGNOSIS: -Inadequate oral intake (NI-2.1).  Status: Ongoing  RELATED TO: nausea and poor appetite  AS  EVIDENCE BY: documented PO of 50%  MONITORING/EVALUATION(Goals): Labs, PO intake, Weight trends 1. Meet greater than 90% of estimated nutrition needs 2. PO intake greater than 75% at meals and supplements  EDUCATION NEEDS: -No education needs identified at this time  INTERVENTION: 1. Will continue to provide Ensure Immune Health 4 times daily. 2. RD to follow for nutrition needs 3. Patient without any snack requests  Dietitian [phone]  DOCUMENTATION CODES Per approved criteria  -Not Applicable    Iven Finn [facility] 05/19/2011, 1:25 PM

## 2011-05-19 NOTE — Progress Notes (Signed)
Subjective:  Patient continues to complain of pp abdominal pain. Last vomited two days ago. Receiving equivalent of zofran IV/PO 8mg  four times daily. Duplicate orders. Still on full liquids this morning. Ate 50% of breakfast. No BM still. Feels like going to have one today. Received one dose of Miralax yesterday along with colace. Senokot ordered today.   Objective: Vital signs in last 24 hours: Temp:  [98.2 F (36.8 C)-98.7 F (37.1 C)] 98.2 F (36.8 C) (02/27 0519) Pulse Rate:  [81-103] 81  (02/27 0519) Resp:  [20] 20  (02/27 0519) BP: (100-120)/(64-74) 100/64 mmHg (02/27 0519) SpO2:  [93 %-96 %] 96 % (02/27 0519) Last BM Date: 05/06/11 General:   Alert,  Well-developed, well-nourished, pleasant and cooperative in NAD. Hair neatly combed.  Head:  Normocephalic and atraumatic. Eyes:  Sclera clear, no icterus.  Chest: CTA bilaterally without rales, rhonchi, crackles.    Heart:  Regular rate and rhythm; no murmurs, clicks, rubs,  or gallops. Abdomen:  Soft, mild epig tenderness and nondistended. No masses, hepatosplenomegaly or hernias noted. Normal bowel sounds, without guarding, and without rebound.   Extremities:  Without clubbing, deformity or edema. Neurologic:  Alert and  oriented x4;  grossly normal neurologically. Skin:  Intact without significant lesions or rashes. Psych:  Alert and cooperative. Normal mood and affect.  Intake/Output from previous day: 02/26 0701 - 02/27 0700 In: 243 [P.O.:240; I.V.:3] Out: 800 [Urine:800] Intake/Output this shift: Total I/O In: 240 [P.O.:240] Out: -   Lab Results: CBC  Basename 05/17/11 0525  WBC 7.0  HGB 13.0  HCT 39.3  MCV 87.5  PLT 380   BMET  Basename 05/19/11 0537 05/17/11 0525  NA 139 138  K 3.4* 3.3*  CL 102 97  CO2 31 32  GLUCOSE 97 102*  BUN 5* 6  CREATININE 0.62 0.72  CALCIUM 9.4 10.1   LFTs  Basename 05/17/11 0525  BILITOT 0.6  BILIDIR --  IBILI --  ALKPHOS 110  AST 16  ALT 14  PROT 6.9  ALBUMIN  3.3*     Imaging Studies: Dg Abd 1 View  05/17/2011  *RADIOLOGY REPORT*  Clinical Data: Abdominal pain and persistent nausea.  ABDOMEN - 1 VIEW  AP view of the abdomen  Comparison: CT scan of the abdomen dated 05/11/2011  Findings: There is extensive contrast in the right side of the colon as well as some stool and contrast in the distal colon. There is moderate air in the nondistended colon.  Clips consistent with prior cholecystectomy.  Old compression fracture of T12.  IMPRESSION: Extensive residual contrast in the colon from prior CT scan. This precludes performance of a satisfactory upper GI at this time.  Original Report Authenticated By: Gwynn Burly, M.D.     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 C/O persistent pp epigastric pain. Last vomited two days ago. UGI could not be done secondary to retained contrast. EUS planned as outpatient due to chronic dilation of pancreatic duct and intermittent abnormal LFTs. Constipation persistent issue as well.  Plan: 1. Adjusted Zofran dosing. 2. Miralax bid and senokot as scheduled. 3. If no BM by tomorrow, consider enemas. 4. Outpatient EUS. 5. Hold on UGI for now.  6. Diet advanced, will see how patient does. 7. Replete K as per attending.   LOS: 9 days   Tana Coast  05/19/2011, 12:51 PM   Discussed with Dr. Karilyn Cota  earlier today. At this point, agree with advancing her diet. If not tolerated, will need to  evaluate patency of her upper GI tract further. We will need to pursue an EUS as an outpatient.

## 2011-05-19 NOTE — Telephone Encounter (Signed)
Referral sent to Dr Jacobs  

## 2011-05-19 NOTE — Progress Notes (Signed)
Subjective: This lady still continues to have symptoms. She says she has not opened her bowels but feels she could do so any time today. I noticed that her potassium has been on the lower side for several days now.           Physical Exam: Blood pressure 100/64, pulse 81, temperature 98.2 F (36.8 C), temperature source Oral, resp. rate 20, height 5\' 4"  (1.626 m), weight 62.143 kg (137 lb), SpO2 96.00%. She does look systemically well. Abdomen is soft and tender subjectively.   Investigations:  Recent Results (from the past 240 hour(s))  URINE CULTURE     Status: Normal   Collection Time   05/10/11 10:28 PM      Component Value Range Status Comment   Specimen Description URINE, CLEAN CATCH   Final    Special Requests NONE   Final    Culture  Setup Time [id number]   Final    Colony Count 75,000 COLONIES/ML   Final    Culture ESCHERICHIA COLI   Final    Report Status 05/13/2011 FINAL   Final    Organism ID, Bacteria ESCHERICHIA COLI   Final   MRSA PCR SCREENING     Status: Normal   Collection Time   05/11/11  2:08 AM      Component Value Range Status Comment   MRSA by PCR NEGATIVE  NEGATIVE  Final      Basic Metabolic Panel:  Basename 05/19/11 0537 05/17/11 0525  NA 139 138  K 3.4* 3.3*  CL 102 97  CO2 31 32  GLUCOSE 97 102*  BUN 5* 6  CREATININE 0.62 0.72  CALCIUM 9.4 10.1  MG 1.7 --  PHOS -- --   Liver Function Tests:  [facility] 05/17/11 0525  AST 16  ALT 14  ALKPHOS 110  BILITOT 0.6  PROT 6.9  ALBUMIN 3.3*     CBC:  Basename 05/17/11 0525  WBC 7.0  NEUTROABS --  HGB 13.0  HCT 39.3  MCV 87.5  PLT 380    Dg Abd 1 View  05/17/2011  *RADIOLOGY REPORT*  Clinical Data: Abdominal pain and persistent nausea.  ABDOMEN - 1 VIEW  AP view of the abdomen  Comparison: CT scan of the abdomen dated 05/11/2011  Findings: There is extensive contrast in the right side of the colon as well as some stool and contrast in the distal colon. There is moderate air  in the nondistended colon.  Clips consistent with prior cholecystectomy.  Old compression fracture of T12.  IMPRESSION: Extensive residual contrast in the colon from prior CT scan. This precludes performance of a satisfactory upper GI at this time.  Original Report Authenticated By: Gwynn Burly, M.D.      Medications: I have reviewed the patient's current medications.  Impression: 1. Constipation. 2. Hypokalemia. 3. Duodenal stricture, status post dilatation.     Plan: 1. Replete potassium to potassium of at least 4. 2. Advance diet. 3. Change Colace to Senokot-S. In my opinion, her symptoms are out of proportion to physical findings.     LOS: 9 days   Wilson Singer Pager [phone]  05/19/2011, 9:49 AM

## 2011-05-19 NOTE — Progress Notes (Signed)
C/O PAIN. UGI ON HOLD. TOLERATING OATMEAL. ENSURE QID. NO BM FOR >3 DAYS.  REVIEWED.

## 2011-05-20 LAB — COMPREHENSIVE METABOLIC PANEL
BUN: 5 mg/dL — ABNORMAL LOW (ref 6–23)
CO2: 31 mEq/L (ref 19–32)
Calcium: 9.7 mg/dL (ref 8.4–10.5)
Chloride: 102 mEq/L (ref 96–112)
Creatinine, Ser: 0.71 mg/dL (ref 0.50–1.10)
GFR calc Af Amer: >90 mL/min (ref >90)
GFR calc non Af Amer: >90 mL/min (ref >90)
Glucose, Bld: 92 mg/dL (ref 70–99)
Total Bilirubin: 0.2 mg/dL — ABNORMAL LOW (ref 0.3–1.2)

## 2011-05-20 LAB — CBC
HCT: 32.6 % — ABNORMAL LOW (ref 36.0–46.0)
Hemoglobin: 10.4 g/dL — ABNORMAL LOW (ref 12.0–15.0)
MCH: 28.1 pg (ref 26.0–34.0)
MCV: 88.1 fL (ref 78.0–100.0)
RBC: 3.7 MIL/uL — ABNORMAL LOW (ref 3.87–5.11)

## 2011-05-20 MED ORDER — AMLODIPINE BESYLATE 5 MG PO TABS: 5.0000 mg | ORAL_TABLET | Freq: Every day | ORAL | Status: DC

## 2011-05-20 MED ORDER — METOCLOPRAMIDE HCL 5 MG PO TABS: 5.0000 mg | ORAL_TABLET | Freq: Four times a day (QID) | ORAL | Status: DC

## 2011-05-20 MED ORDER — HYDROCODONE-ACETAMINOPHEN 7.5-500 MG PO TABS
1.0000 | ORAL_TABLET | Freq: Four times a day (QID) | ORAL | Status: DC | PRN
Start: 2011-05-20 — End: 2011-06-20

## 2011-05-20 NOTE — Progress Notes (Signed)
D/c instructions given to patient with no questions. F/u appointment made with Dr. Jena Gauss for next week.

## 2011-05-20 NOTE — Discharge Summary (Signed)
Physician Discharge Summary  Patient ID: Jordan Haney MRN: [id number] DOB/AGE: 1955/08/31 56 y.o. Primary Care Physician:Bridges, Saul Fordyce, DO, DO Admit date: 05/10/2011 Discharge date: 05/20/2011    Discharge Diagnoses:  1. Duodenal stricture, status post dilation by Dr. Kendell Bane. 2. Constipation, resolved. 3. Rheumatoid arthritis. 4. Hypertension.  Medication List  As of 05/20/2011  8:34 AM   TAKE these medications         amLODipine 5 MG tablet   Commonly known as: NORVASC   Take 1 tablet (5 mg total) by mouth daily.      BC HEADACHE POWDER PO   Take 1-2 packets by mouth as needed. For pain      esomeprazole 40 MG capsule   Commonly known as: NEXIUM   Take 40 mg by mouth daily before breakfast.      HUMIRA 40 MG/0.8ML injection   Generic drug: adalimumab   Inject 40 mg into the skin every 7 (seven) days.      HYDROcodone-acetaminophen 7.5-500 MG per tablet   Commonly known as: LORTAB   Take 1 tablet by mouth every 6 (six) hours as needed. For pain      Linaclotide 145 MCG Caps   Take 1 capsule by mouth daily. 1 PO DAILY      LORazepam 0.5 MG tablet   Commonly known as: ATIVAN   Take 0.5 mg by mouth 2 (two) times daily.      methotrexate 2.5 MG tablet   Commonly known as: RHEUMATREX   Take 12.5 mg by mouth once a week. Caution:Chemotherapy. Protect from light.      metoCLOPramide 5 MG tablet   Commonly known as: REGLAN   Take 1 tablet (5 mg total) by mouth 4 (four) times daily.      nitroGLYCERIN 0.4 MG SL tablet   Commonly known as: NITROSTAT   Place 0.4 mg under the tongue every 5 (five) minutes as needed. Chest pain      polyethylene glycol packet   Commonly known as: MIRALAX / GLYCOLAX   Take 17 g by mouth 2 (two) times daily.      QUEtiapine 100 MG tablet   Commonly known as: SEROQUEL   Take 100 mg by mouth 2 (two) times daily.      senna 8.6 MG tablet   Commonly known as: SENOKOT   Take 1 tablet (8.6 mg total) by mouth daily. FOR CONSTIPATION.              Discharged Condition: Stable and improved.    Consults: Gastroenterology, Dr. Kendell Bane.  Significant Diagnostic Studies: Dg Abd 1 View  05/17/2011  *RADIOLOGY REPORT*  Clinical Data: Abdominal pain and persistent nausea.  ABDOMEN - 1 VIEW  AP view of the abdomen  Comparison: CT scan of the abdomen dated 05/11/2011  Findings: There is extensive contrast in the right side of the colon as well as some stool and contrast in the distal colon. There is moderate air in the nondistended colon.  Clips consistent with prior cholecystectomy.  Old compression fracture of T12.  IMPRESSION: Extensive residual contrast in the colon from prior CT scan. This precludes performance of a satisfactory upper GI at this time.  Original Report Authenticated By: Gwynn Burly, M.D.   Ct Abdomen Pelvis W Contrast  05/10/2011  *RADIOLOGY REPORT*  Clinical Data: Epigastric abdominal pain, nausea, vomiting, history of chronic pancreatitis  CT ABDOMEN AND PELVIS WITH CONTRAST  Technique:  Multidetector CT imaging of the abdomen and pelvis was performed  following the standard protocol during bolus administration of intravenous contrast.  Contrast: OMNIPAQUE IOHEXOL 300 MG/ML IV SOLN  Comparison: CT abdomen pelvis of 03/20/2011  Findings: The lung bases are clear. The lung bases are clear. There is diffuse severe fatty infiltration of the liver.  No ductal dilatation is seen.  Surgical clips are present from prior cholecystectomy.  Dilatation of the pancreatic duct is stable and may be due to the patient's history of chronic pancreatitis.  No present evidence of pancreatitis is seen.  The adrenal glands and spleen are unremarkable.  The stomach is moderately distended with contrast and is unremarkable.  A nodular area within the antrum of the stomach may represent retained food debris or mucosal thickening but a polypoid lesion cannot be excluded.  The kidneys enhance and there is a stable cyst emanating from  the upper pole of the left kidney anterolaterally.  No renal calculi are seen and no hydronephrosis is noted.  The abdominal aorta is normal in caliber.  The urinary bladder is moderately urine distended and unremarkable. The uterus has previously been resected.  No fluid is seen within the pelvis.  A moderate amount of feces is noted throughout the entire colon.  The terminal ileum is unremarkable.  Partial compression deformity of T12 vertebral body is noted which appears old with some bony spurring present.  IMPRESSION:  1.  No acute abnormality is seen on CT of the abdomen and pelvis. 2.  Diffuse severe fatty infiltration of the liver. 3.  Polypoid area in the distal antrum of the stomach probably represents mucosal infolding or food debris. 4.  Moderate amount of feces throughout the entire colon. 5.  Apparent old compression deformity of T12.  Original Report Authenticated By: Juline Patch, M.D.    Lab Results: Basic Metabolic Panel:  Basename 05/20/11 0531 05/19/11 0537  NA 140 139  K 4.4 3.4*  CL 102 102  CO2 31 31  GLUCOSE 92 97  BUN 5* 5*  CREATININE 0.71 0.62  CALCIUM 9.7 9.4  MG -- 1.7  PHOS -- --   Liver Function Tests:  Basename 05/20/11 0531  AST 22  ALT 14  ALKPHOS 107  BILITOT 0.2*  PROT 6.4  ALBUMIN 3.0*     CBC:  Basename 05/20/11 0531  WBC 4.5  NEUTROABS --  HGB 10.4*  HCT 32.6*  MCV 88.1  PLT 319    Recent Results (from the past 240 hour(s))  URINE CULTURE     Status: Normal   Collection Time   05/10/11 10:28 PM      Component Value Range Status Comment   Specimen Description URINE, CLEAN CATCH   Final    Special Requests NONE   Final    Culture  Setup Time [id number]   Final    Colony Count 75,000 COLONIES/ML   Final    Culture ESCHERICHIA COLI   Final    Report Status 05/13/2011 FINAL   Final    Organism ID, Bacteria ESCHERICHIA COLI   Final   MRSA PCR SCREENING     Status: Normal   Collection Time   05/11/11  2:08 AM      Component  Value Range Status Comment   MRSA by PCR NEGATIVE  NEGATIVE  Final      Hospital Course: This 56 year old lady was admitted with symptoms of nausea, vomiting and abdominal pain. She had undergone upper GI endoscopy approximately 6 weeks ago when she was found to have a duodenal ulcer  and a duodenal stricture which was dilated. She now presented with similar symptoms. She was seen by Dr. Kendell Bane, gastroenterologist, who performed an EGD and once again discovered a stricture. This was dilated. Her post procedure course was slightly slow in that she became rather constipated with abdominal pain and  symptoms out of proportion to her physical findings and objective evidence. Laxatives were given to her and finally she opened her bowels 6 times yesterday with good normal bowel motions. She feels much better today and is keen to go home. She will need further studies as an outpatient with endoscopic ultrasound. This will be done by Dr. Kendell Bane.  Discharge Exam: Blood pressure 102/68, pulse 91, temperature 98.7 F (37.1 C), temperature source Oral, resp. rate 20, height 5\' 4"  (1.626 m), weight 62.143 kg (137 lb), SpO2 92.00%. She looks systemically well. Heart sounds are present and normal. Abdomen is soft and nontender. Lung fields are clear. She is alert and orientated.  Disposition: Home.  Discharge Orders    Future Orders Please Complete By Expires   Diet - low sodium heart healthy      Increase activity slowly         Follow-up Information    Follow up with Eula Listen, MD. Schedule an appointment as soon as possible for a visit in 1 week.   Contact information:   [address] [place] [address] [phone]          SignedWilson Singer Pager [phone]  05/20/2011, 8:34 AM

## 2011-05-25 ENCOUNTER — Telehealth: Payer: Self-pay

## 2011-05-25 DIAGNOSIS — K861 Other chronic pancreatitis: Secondary | ICD-10-CM

## 2011-05-25 NOTE — Telephone Encounter (Signed)
Message copied by Donata Duff on Tue May 25, 2011  7:57 AM ------      Message from: Rob Bunting P      Created: Thu May 20, 2011  8:28 AM       Adisyn Ruscitti,      She needs upper eus, radial +/- linear, dx dilated PD, ?chronic pancreatitis, ++propofol, in 4-5 weeks.            Crystal,      We will get this scheduled. Also working on the other EUS referral you sent yesterday. Thank you.            DJ                  ----- Message -----         From: Chales Abrahams, CMA         Sent: 05/19/2011  10:43 AM           To: Rob Bunting, MD                        ----- Message -----         From: Sula Soda         Sent: 05/19/2011  10:26 AM           To: Chales Abrahams, CMA            Please schedule EUS in 3-4 weeks for dilated pancreatic duct, chronic pancreatitis, abnormal LFTs.            Thanks,            [employer]

## 2011-05-26 ENCOUNTER — Other Ambulatory Visit: Payer: Self-pay

## 2011-05-26 ENCOUNTER — Encounter: Payer: Self-pay | Admitting: Internal Medicine

## 2011-05-26 ENCOUNTER — Telehealth: Payer: Self-pay | Admitting: Gastroenterology

## 2011-05-26 NOTE — Telephone Encounter (Signed)
See staff message below

## 2011-05-26 NOTE — Telephone Encounter (Signed)
Message copied by Irish Elders on Wed May 26, 2011  8:55 AM ------      Message from: Rob Bunting P      Created: Thu May 20, 2011  8:28 AM       Patty,      She needs upper eus, radial +/- linear, dx dilated PD, ?chronic pancreatitis, ++propofol, in 4-5 weeks.            Shamira Toutant,      We will get this scheduled. Also working on the other EUS referral you sent yesterday. Thank you.            DJ                  ----- Message -----         From: Chales Abrahams, CMA         Sent: 05/19/2011  10:43 AM           To: Rob Bunting, MD                        ----- Message -----         From: Sula Soda         Sent: 05/19/2011  10:26 AM           To: Chales Abrahams, CMA            Please schedule EUS in 3-4 weeks for dilated pancreatic duct, chronic pancreatitis, abnormal LFTs.            Thanks,            [employer]

## 2011-05-26 NOTE — Telephone Encounter (Signed)
Pt has been scheduled for her EUS no answer on her home phone and no voice mail has been set up

## 2011-05-27 ENCOUNTER — Ambulatory Visit: Admitting: Gastroenterology

## 2011-05-27 ENCOUNTER — Encounter: Payer: Self-pay | Admitting: Gastroenterology

## 2011-05-27 NOTE — Telephone Encounter (Signed)
Unable to reach pt no voice mail,  Instructions have been mailed

## 2011-05-28 NOTE — Telephone Encounter (Signed)
Pt has been notified and meds reviewed.  She will call with any questions or concerns after reviewing the instructions mailed.

## 2011-06-02 ENCOUNTER — Encounter: Payer: Self-pay | Admitting: Gastroenterology

## 2011-06-02 ENCOUNTER — Ambulatory Visit (INDEPENDENT_AMBULATORY_CARE_PROVIDER_SITE_OTHER): Admitting: Gastroenterology

## 2011-06-02 VITALS — BP 133/93 | HR 94 | Temp 97.8°F | Ht 64.0 in | Wt 142.8 lb

## 2011-06-02 DIAGNOSIS — R109 Unspecified abdominal pain: Secondary | ICD-10-CM

## 2011-06-02 DIAGNOSIS — R7989 Other specified abnormal findings of blood chemistry: Secondary | ICD-10-CM

## 2011-06-02 DIAGNOSIS — R945 Abnormal results of liver function studies: Secondary | ICD-10-CM | POA: Insufficient documentation

## 2011-06-02 DIAGNOSIS — K8689 Other specified diseases of pancreas: Secondary | ICD-10-CM | POA: Insufficient documentation

## 2011-06-02 DIAGNOSIS — K315 Obstruction of duodenum: Secondary | ICD-10-CM

## 2011-06-02 DIAGNOSIS — R1013 Epigastric pain: Secondary | ICD-10-CM

## 2011-06-02 NOTE — Progress Notes (Signed)
Primary Care Physician: Rosita Kea, DO  Primary Gastroenterologist:  Jonette Eva, MD  Chief Complaint  Patient presents with  . Follow-up  . Abdominal Pain    HPI: Jordan Haney is a 56 y.o. female here for followup her recent hospitalization. 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 Last time was on 05/12/2011.  She also has a history of chronic pancreatic duct dilation on prior imaging studies, intermittent abnormal LFTs. She is scheduled to have endoscopic ultrasound with Dr. Rob Bunting in the near future.  She went home from the hospital on February 28. She states she had been doing well with a soft diet up until yesterday. She ate some rice last night and has been feeling nauseated and having epigastric pain since that time. Currently having epigastric pain radiating into her back. No vomiting yet. Really denies any heartburn. Bowels finally started moving are pretty regular. No melena or rectal bleeding.    . Esophagogastroduodenoscopy 04/06/11    Dr. Darrick Penna: 1-2 cm hiatal hernia, mod gastritis, 97mmX3mm linear ulcer in duodenal bulb, stricture 1st part of duodenum, dilated to 12 mm  . Esophagogastroduodenoscopy 05/12/11    partial gastric oulet obstruction secondary to stricture between bulb/2nd portion of duodenum with marked friability and inflammation but no discrete ulcer, dilated stomach. s/p dilation but high risk for restenosis    Current Outpatient Prescriptions  Medication Sig Dispense Refill  . adalimumab (HUMIRA) 40 MG/0.8ML injection Inject 40 mg into the skin every 7 (seven) days.       Marland Kitchen amLODipine (NORVASC) 5 MG tablet Take 1 tablet (5 mg total) by mouth daily.  30 tablet  0  . Aspirin-Salicylamide-Caffeine (BC HEADACHE POWDER PO) Take 1-2 packets by mouth as needed. For pain      . esomeprazole (NEXIUM) 40 MG capsule Take 40 mg by mouth daily before breakfast.        . HYDROcodone-acetaminophen (LORTAB 7.5) 7.5-500 MG  per tablet Take 1 tablet by mouth every 6 (six) hours as needed. For pain  30 tablet  0  . Linaclotide (LINZESS) 145 MCG CAPS Take 1 capsule by mouth daily. 1 PO DAILY  31 capsule  11  . LORazepam (ATIVAN) 0.5 MG tablet Take 0.5 mg by mouth 2 (two) times daily.      . methotrexate (RHEUMATREX) 2.5 MG tablet Take 12.5 mg by mouth once a week. Caution:Chemotherapy. Protect from light.      . nitroGLYCERIN (NITROSTAT) 0.4 MG SL tablet Place 0.4 mg under the tongue every 5 (five) minutes as needed. Chest pain      . polyethylene glycol (MIRALAX / GLYCOLAX) packet Take 17 g by mouth 2 (two) times daily.  14 each    . QUEtiapine (SEROQUEL) 100 MG tablet Take 100 mg by mouth 2 (two) times daily.      Marland Kitchen senna (SENOKOT) 8.6 MG tablet Take 1 tablet (8.6 mg total) by mouth daily. FOR CONSTIPATION.        Allergies as of 06/02/2011 - Review Complete 06/02/2011  Allergen Reaction Noted  . Compazine Shortness Of Breath 03/20/2011  . Phenergan Shortness Of Breath 03/20/2011  . Vistaril (hyzine) Shortness Of Breath 03/20/2011    ROS:  General: Negative for anorexia, weight loss, fever, chills, fatigue, weakness. ENT: Negative for hoarseness, difficulty swallowing , nasal congestion. CV: Negative for chest pain, angina, palpitations, dyspnea on exertion, peripheral edema.  Respiratory: Negative for dyspnea at rest, dyspnea on exertion, cough, sputum, wheezing.  GI:  See history of present illness. GU:  Negative for dysuria, hematuria, urinary incontinence, urinary frequency, nocturnal urination.  Endo: Negative for unusual weight change. Weight has been stable over the last 2 months.   Physical Examination:   BP 133/93  Pulse 94  Temp(Src) 97.8 F (36.6 C) (Temporal)  Ht 5\' 4"  (1.626 m)  Wt 142 lb 12.8 oz (64.774 kg)  BMI 24.51 kg/m2  General: Well-nourished, well-developed in no acute distress. Well groomed today. Eyes: No icterus. Mouth: Oropharyngeal mucosa moist and pink , no lesions  erythema or exudate. Lungs: Clear to auscultation bilaterally.  Heart: Regular rate and rhythm, no murmurs rubs or gallops.  Abdomen: Bowel sounds are normal, nondistended. Mild to moderate epigastric tenderness to deep palpation.   Extremities: No lower extremity edema. No clubbing or deformities. Neuro: Alert and oriented x 4   Skin: Warm and dry, no jaundice.   Psych: Alert and cooperative, normal mood and affect.  Labs:  Lab Results  Component Value Date   WBC 4.5 05/20/2011   HGB 10.4* 05/20/2011   HCT 32.6* 05/20/2011   MCV 88.1 05/20/2011   PLT 319 05/20/2011   Lab Results  Component Value Date   CREATININE 0.71 05/20/2011   BUN 5* 05/20/2011   NA 140 05/20/2011   K 4.4 05/20/2011   CL 102 05/20/2011   CO2 31 05/20/2011   Lab Results  Component Value Date   ALT 14 05/20/2011   AST 22 05/20/2011   ALKPHOS 107 05/20/2011   BILITOT 0.2* 05/20/2011   Lab Results  Component Value Date   LIPASE 16 05/11/2011

## 2011-06-02 NOTE — Patient Instructions (Signed)
Please consume full liquids until your pain settles down.this consist of soft foods such as oatmeal, creamy soups, grits, etc. Please have your blood work done as soon as possible. We will call you with results. If your pain worsens or you develop persistent vomiting, please let us know or go to the nearest emergency department.

## 2011-06-03 ENCOUNTER — Encounter: Payer: Self-pay | Admitting: Gastroenterology

## 2011-06-03 LAB — LIPASE: Lipase: 15 U/L (ref 0–75)

## 2011-06-03 LAB — HEPATIC FUNCTION PANEL
Bilirubin, Direct: 0.1 mg/dL (ref 0.0–0.3)
Indirect Bilirubin: 0.2 mg/dL (ref 0.0–0.9)

## 2011-06-03 NOTE — Assessment & Plan Note (Signed)
Refer to epigastric pain assessment and plan.

## 2011-06-03 NOTE — Progress Notes (Signed)
Cc to PCP 

## 2011-06-03 NOTE — Assessment & Plan Note (Addendum)
Recurrent epigastric pain associated with nausea and vomiting. Currently without vomiting. She has a history of significant duodenal stricture dilated in February and January as outlined above. 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 Also with previous abnormal LFTs, chronically dilated pancreatic duct, questionable history of chronic pancreatitis. Unclear whether her current symptoms are related to duodenal stricture versus pancreas versus other. She is already scheduled for endoscopic ultrasound next month.will check her lab work today to look for abnormal LFTs, pancreatitis. I have advised her to consume full liquids until symptoms settle down. I will also discuss with Dr. Darrick Penna to determine if there any plans for repeat EGD to evaluate for persistent stricture.  Not mentioned previously, we need to discuss with patient to see if she's had a screening colonoscopy as there has not been one documented in her medical history.

## 2011-06-03 NOTE — Assessment & Plan Note (Signed)
Refer to epigastric pain assessment and plan. 

## 2011-06-04 ENCOUNTER — Telehealth: Payer: Self-pay

## 2011-06-04 DIAGNOSIS — R112 Nausea with vomiting, unspecified: Secondary | ICD-10-CM

## 2011-06-04 NOTE — Telephone Encounter (Signed)
Agree with need to go to ED if severe pain and not able to keep food or liquid down. Labs looked good so I suspect symptoms are due to recurrent stricture. Dr. Darrick Penna has not responded about repeating EGD but clearly if having symptoms she may need to have this done.

## 2011-06-04 NOTE — Telephone Encounter (Addendum)
Pt called to check on her labs. I told her it looks like extender has not had time to sign off. She said she is still having the abdominal pain. Vomiting a lot, and it has been going on ever since she was here in the office. She has only eaten applesauce, yogurt and sprite. She said she was told to go to the ED if it continued, she is just afraid that she will be admitted. I told her if she is in severe pain and still vomiting she should go on to the ED now. She did not say that she would definitely go.

## 2011-06-04 NOTE — Progress Notes (Signed)
Quick Note:  Labs are good. Awaiting input from Dr. Darrick Penna regarding if repeat EGD needs to be done. ______

## 2011-06-06 NOTE — Progress Notes (Signed)
PT NEEDS UGI ASAP. CONSIDER AGGRESSIVE BOWEL REGIMEN FOR ? IBS-C.  REVIEWED.

## 2011-06-07 ENCOUNTER — Other Ambulatory Visit: Payer: Self-pay

## 2011-06-07 ENCOUNTER — Other Ambulatory Visit: Payer: Self-pay | Admitting: Gastroenterology

## 2011-06-07 DIAGNOSIS — IMO0001 Reserved for inherently not codable concepts without codable children: Secondary | ICD-10-CM

## 2011-06-07 NOTE — Telephone Encounter (Signed)
Routing this to Dr. Darrick Penna. Was unaware that Verlon Au would not be in today when I spoke to the pt this AM.

## 2011-06-07 NOTE — Telephone Encounter (Signed)
Called pt. She did not go to the ED. She said she is better. She is drinking liquids, eating jello and soft foods. She is still having some nausea but no vomiting. ( she has med for nausea). She said the abdominal pain has not completely went away. She rates it a 6 or 7 on a scale of 0-10. Please advise!

## 2011-06-07 NOTE — Telephone Encounter (Signed)
Pt scheduled for tomorrow at 10:00 AM and be at the hospital at 9:45 Am. She could not have made it there today anyway.

## 2011-06-07 NOTE — Telephone Encounter (Signed)
PT NEEDS UGI ASAP TO EVALUATE STRICTURE.

## 2011-06-08 ENCOUNTER — Ambulatory Visit (HOSPITAL_COMMUNITY)
Admission: RE | Admit: 2011-06-08 | Discharge: 2011-06-08 | Disposition: A | Discharge status: Home / Self Care | Source: Ambulatory Visit | Attending: Gastroenterology | Admitting: Gastroenterology

## 2011-06-08 DIAGNOSIS — K222 Esophageal obstruction: Secondary | ICD-10-CM | POA: Insufficient documentation

## 2011-06-08 DIAGNOSIS — K259 Gastric ulcer, unspecified as acute or chronic, without hemorrhage or perforation: Secondary | ICD-10-CM | POA: Insufficient documentation

## 2011-06-08 DIAGNOSIS — IMO0001 Reserved for inherently not codable concepts without codable children: Secondary | ICD-10-CM

## 2011-06-08 DIAGNOSIS — K269 Duodenal ulcer, unspecified as acute or chronic, without hemorrhage or perforation: Secondary | ICD-10-CM | POA: Insufficient documentation

## 2011-06-08 NOTE — Telephone Encounter (Signed)
SPOKE WITH RAD TECH-pt tolerated UGI. NO VOMITING.

## 2011-06-08 NOTE — Telephone Encounter (Signed)
PLEASE CALL PT. HER ABDOMINAL PAIN MAY BE DUE TO CHRONIC PANCREATITIS, ABDOMINAL WALL PAIN FROM VOMITING & IBS. YOUR LABS ON 3/15 WERE NORMAL. YOUR UGI SERIES DOES NOT SHOW OBSTRUCTION OF YOUR ESOPHAGUS, STOMACH, OR SMALL INTESTINES. The films suggest YOU ARE constipated. YOU should continue Linzess and start Miralax 3 times a day for the next 5 days, THEN CONTINUE BID. INCREASE YOUR SENNAKOT TO BID. YOUR NARCOTICS CAN WORSEN CONSTIPATION SO ONLY USE THEM IF YOUR PAIN IS REALLY BAD. USE ZOFRAN 4 MG 3-4 TIMES A DAY AS NEEDED FOR NAUSEA OR VOMITING, #30, RFX1. YOU NEED A GASTRIC EMPTYTING STUDY TO EVALUATE FOR GASTROPARESIS AS A REASON FOR YOUR INTERMITTENT NAUSEA AND VOMITING.

## 2011-06-08 NOTE — Telephone Encounter (Signed)
Called and informed pt. RX called to Tammy at [facility] in [place].

## 2011-06-08 NOTE — Telephone Encounter (Signed)
Noted  

## 2011-06-09 ENCOUNTER — Telehealth: Payer: Self-pay | Admitting: Gastroenterology

## 2011-06-09 NOTE — Telephone Encounter (Signed)
Pt is aware of appt

## 2011-06-09 NOTE — Telephone Encounter (Signed)
Opened in era  

## 2011-06-09 NOTE — Telephone Encounter (Signed)
Addended by: Jennings Books on: 06/09/2011 09:34 AM   Modules accepted: Orders

## 2011-06-09 NOTE — Telephone Encounter (Signed)
Pt is scheduled for Gastric Emptying Study 06/11/11@8am ,NPO after MN and arrive at 7:45am  I called the patient to give appt and was told the pt was sleeping and I needed to call back in five minutes,0am

## 2011-06-11 ENCOUNTER — Other Ambulatory Visit (HOSPITAL_COMMUNITY)

## 2011-06-17 ENCOUNTER — Telehealth: Payer: Self-pay

## 2011-06-17 NOTE — Telephone Encounter (Signed)
Pt aware of new date and time of GES (April 11 @8 :00) She will bring the paper from [facility] or come by to sign a release. She did have to have one unit of blood.

## 2011-06-17 NOTE — Telephone Encounter (Signed)
Need records sooner than later please.

## 2011-06-17 NOTE — Telephone Encounter (Signed)
Pt called wanting to know if it was ok for her to stay have her emptying study. She was taking to [facility] on Saturday for emergency surgery. She had a upper and a lower gi bleed and she fell out. She is now home but very weak. Please advise

## 2011-06-17 NOTE — Telephone Encounter (Signed)
Postpone GES for couple of weeks. She can reschedule with radiology or maybe Crystal can help.  We need to get records from [facility], including labs, egd, colonoscopy, d/c summary.  Did she get transfusion?

## 2011-06-19 ENCOUNTER — Encounter (HOSPITAL_COMMUNITY): Payer: Self-pay | Admitting: *Deleted

## 2011-06-19 ENCOUNTER — Emergency Department (HOSPITAL_COMMUNITY)
Admission: EM | Admit: 2011-06-19 | Discharge: 2011-06-20 | Disposition: A | Discharge status: Home / Self Care | Attending: Emergency Medicine | Admitting: Emergency Medicine

## 2011-06-19 DIAGNOSIS — R1013 Epigastric pain: Secondary | ICD-10-CM | POA: Insufficient documentation

## 2011-06-19 DIAGNOSIS — Z79899 Other long term (current) drug therapy: Secondary | ICD-10-CM | POA: Insufficient documentation

## 2011-06-19 DIAGNOSIS — M069 Rheumatoid arthritis, unspecified: Secondary | ICD-10-CM | POA: Insufficient documentation

## 2011-06-19 DIAGNOSIS — R110 Nausea: Secondary | ICD-10-CM | POA: Insufficient documentation

## 2011-06-19 DIAGNOSIS — D649 Anemia, unspecified: Secondary | ICD-10-CM | POA: Insufficient documentation

## 2011-06-19 DIAGNOSIS — E876 Hypokalemia: Secondary | ICD-10-CM | POA: Insufficient documentation

## 2011-06-19 DIAGNOSIS — I252 Old myocardial infarction: Secondary | ICD-10-CM | POA: Insufficient documentation

## 2011-06-19 DIAGNOSIS — M549 Dorsalgia, unspecified: Secondary | ICD-10-CM | POA: Insufficient documentation

## 2011-06-19 MED ORDER — SODIUM CHLORIDE 0.9 % IV SOLN
Freq: Once | INTRAVENOUS | Status: AC
  Administered 2011-06-20: via INTRAVENOUS

## 2011-06-19 MED ORDER — HYDROMORPHONE HCL PF 1 MG/ML IJ SOLN
1.0000 mg | Freq: Once | INTRAMUSCULAR | Status: AC
  Administered 2011-06-20: 1 mg via INTRAVENOUS
  Filled 2011-06-19: qty 1

## 2011-06-19 MED ORDER — ONDANSETRON HCL 4 MG/2ML IJ SOLN
4.0000 mg | Freq: Once | INTRAMUSCULAR | Status: AC
  Administered 2011-06-20: 4 mg via INTRAVENOUS
  Filled 2011-06-19: qty 2

## 2011-06-19 NOTE — ED Notes (Signed)
Pt states she just left [facility] after treatment for "bleeding ulcers"  Pt states she signed out from that hospital because "they were mean to me and didn't care about my pain"

## 2011-06-19 NOTE — ED Provider Notes (Signed)
History     CSN: [id number]  Arrival date & time 06/19/11  2326   First MD Initiated Contact with Patient 06/19/11 2348      Chief Complaint  Patient presents with  . Abdominal Pain    (Consider location/radiation/quality/duration/timing/severity/associated sxs/prior treatment) Patient is a 56 y.o. female presenting with abdominal pain. The history is provided by the patient.  Abdominal Pain The primary symptoms of the illness include abdominal pain.  She had onset about 4:30 PM today severe upper abdominal pain radiating to the back. Pain is sharp and she rates it at 10 out of 10. It is worse when she sits up, but nothing makes it feel any better. There is associated nausea without vomiting. She's not had any diarrhea or constipation. She says that she had a blood transfusion earlier this week because of bleeding from an ulcer and she had rested for several days tolerating that. Today she was mildly active. She had recently been admitted to the hospital for a duodenal stricture which was dilated.  Past Medical History  Diagnosis Date  . RA (rheumatoid arthritis)  2008  . MI (myocardial infarction) 2008  . Constipation 03/21/2011  . Hypokalemia 03/21/2011  . Fatty liver 03/21/2011  . Pancreatitis     states 3 years ago, very severe, ?biliary pancreatitis   . T12 compression fracture 2011  . Duodenal ulcer     remote per patient. +BC powders, patient report negative EGD six months ago  . Dilated pancreatic duct     ?chronic pancreatitis, EUS pending (06/02/11)    Past Surgical History  Procedure Date  . Cholecystectomy   . Knee surgery   . Appendectomy   . Complete hysterectomy   . Ventral wall hernia   . Esophagogastroduodenoscopy 08/2010    Dr. Samuella Cota, [place]. small hh, mild prepylori gastritis. path unavailable  . Esophagogastroduodenoscopy 04/2010    Dr. Samuella Cota, [place]. small hh, mild distal esophagitis, antral gastritis and duodenitis. Bx mild chronic gastritis and no  H.pylori  . Esophagogastroduodenoscopy 08/2009    Dr. Allena Katz, [place]. Moderate gastritis, moderate duodenitis with nodularity in proximal duodenal bulb, bx chronic gastritis, no helicobacter, mild chronic duodenitis  . Esophagogastroduodenoscopy 02/2007    Dr. Samuella Cota, [place]. antral gastritis and duodenal ulcers. Bx mild chronic gastritis. No bx from duodenal ulcer available.  Gaspar Bidding dilation 04/06/2011    Procedure: SAVORY DILATION;  Surgeon: Arlyce Harman, MD;  Location: [facility];  Service: Endoscopy;  Laterality: N/A;  16 mm  . Esophagogastroduodenoscopy 04/06/11    Dr. Darrick Penna: 1-2 cm hiatal hernia, mod gastritis, 63mmX3mm linear ulcer in duodenal bulb, stricture 1st part of duodenum, dilated to 12 mm  . Esophagogastroduodenoscopy 05/12/11    partial gastric oulet obstruction secondary to stricture between bulb/2nd portion of duodenum with marked friability and inflammation but no discrete ulcer, dilated stomach. s/p dilation but high risk for restenosis    Family History  Problem Relation Age of Onset  . Colon cancer Neg Hx   . Liver cancer Neg Hx   . Breast cancer Neg Hx   . Inflammatory bowel disease Neg Hx   . Heart attack Mother     History  Substance Use Topics  . Smoking status: Never Smoker   . Smokeless tobacco: Not on file  . Alcohol Use: No    OB History    Grav Para Term Preterm Abortions TAB SAB Ect Mult Living  Review of Systems  Gastrointestinal: Positive for abdominal pain.  All other systems reviewed and are negative.    Allergies  Compazine; Phenergan; and Vistaril  Home Medications   Current Outpatient Rx  Name Route Sig Dispense Refill  . ADALIMUMAB 40 MG/0.8ML [place] KIT Subcutaneous Inject 40 mg into the skin every 7 (seven) days.     . AMLODIPINE BESYLATE 5 MG PO TABS Oral Take 1 tablet (5 mg total) by mouth daily. 30 tablet 0  . ESOMEPRAZOLE MAGNESIUM 40 MG PO CPDR Oral Take 40 mg by mouth daily before breakfast.      .  HYDROCODONE-ACETAMINOPHEN 7.5-500 MG PO TABS Oral Take 1 tablet by mouth every 6 (six) hours as needed. For pain 30 tablet 0  . LINACLOTIDE 145 MCG PO CAPS Oral Take 1 capsule by mouth daily. 1 PO DAILY 31 capsule 11  . LORAZEPAM 0.5 MG PO TABS Oral Take 0.5 mg by mouth 2 (two) times daily.    Marland Kitchen METHOTREXATE 2.5 MG PO TABS Oral Take 12.5 mg by mouth once a week. Caution:Chemotherapy. Protect from light.    Marland Kitchen METOCLOPRAMIDE HCL 5 MG PO TABS Oral Take 1 tablet (5 mg total) by mouth 4 (four) times daily. 120 tablet 0  . NITROGLYCERIN 0.4 MG SL SUBL Sublingual Place 0.4 mg under the tongue every 5 (five) minutes as needed. Chest pain    . POLYETHYLENE GLYCOL 3350 PO PACK Oral Take 17 g by mouth daily.    . QUETIAPINE FUMARATE 100 MG PO TABS Oral Take 100 mg by mouth 2 (two) times daily.    . SENNOSIDES 8.6 MG PO TABS Oral Take 1 tablet (8.6 mg total) by mouth daily. FOR CONSTIPATION.      BP 126/86  Pulse 87  Temp 98.8 F (37.1 C)  Resp 20  Wt 142 lb (64.411 kg)  SpO2 100%  Physical Exam  Nursing note and vitals reviewed.  56 year old female who appears to be in pain. Vital signs are normal. Oxygen saturation is 100% which is normal. Head is normocephalic and atraumatic. PERRLA, EOMI. There is no scleral icterus. Oropharynx is clear. Neck is nontender and supple without adenopathy. Lungs are clear without rales, wheezes, or rhonchi. Heart has regular rate and rhythm without murmur. Abdomen is soft and flat with moderate epigastric tenderness. There is no rebound or guarding. There is no hepatosplenomegaly. Peristalsis is diminished. Extremities have a full range of motion, no cyanosis. Skin is warm and dry without rash. Neurologic: Mental status is normal, cranial nerves are intact, there no focal motor or sensory deficits.  ED Course  Procedures (including critical care time)  Results for orders placed during the hospital encounter of 06/19/11  CBC      Component Value Range   WBC 5.1  4.0  - 10.5 (K/uL)   RBC 3.20 (*) 3.87 - 5.11 (MIL/uL)   Hemoglobin 9.1 (*) 12.0 - 15.0 (g/dL)   HCT 16.1 (*) 09.6 - 46.0 (%)   MCV 86.9  78.0 - 100.0 (fL)   MCH 28.4  26.0 - 34.0 (pg)   MCHC 32.7  30.0 - 36.0 (g/dL)   RDW 04.5 (*) 40.9 - 15.5 (%)   Platelets 298  150 - 400 (K/uL)  DIFFERENTIAL      Component Value Range   Neutrophils Relative 37 (*) 43 - 77 (%)   Neutro Abs 1.9  1.7 - 7.7 (K/uL)   Lymphocytes Relative 41  12 - 46 (%)   Lymphs Abs 2.1  0.7 -  4.0 (K/uL)   Monocytes Relative 13 (*) 3 - 12 (%)   Monocytes Absolute 0.7  0.1 - 1.0 (K/uL)   Eosinophils Relative 8 (*) 0 - 5 (%)   Eosinophils Absolute 0.4  0.0 - 0.7 (K/uL)   Basophils Relative 1  0 - 1 (%)   Basophils Absolute 0.1  0.0 - 0.1 (K/uL)  COMPREHENSIVE METABOLIC PANEL      Component Value Range   Sodium 139  135 - 145 (mEq/L)   Potassium 3.0 (*) 3.5 - 5.1 (mEq/L)   Chloride 99  96 - 112 (mEq/L)   CO2 31  19 - 32 (mEq/L)   Glucose, Bld 102 (*) 70 - 99 (mg/dL)   BUN 4 (*) 6 - 23 (mg/dL)   Creatinine, Ser 1.61  0.50 - 1.10 (mg/dL)   Calcium 9.4  8.4 - 09.6 (mg/dL)   Total Protein 6.4  6.0 - 8.3 (g/dL)   Albumin 3.4 (*) 3.5 - 5.2 (g/dL)   AST 25  0 - 37 (U/L)   ALT 17  0 - 35 (U/L)   Alkaline Phosphatase 121 (*) 39 - 117 (U/L)   Total Bilirubin 0.2 (*) 0.3 - 1.2 (mg/dL)   GFR calc non Af Amer >90  >90 (mL/min)   GFR calc Af Amer >90  >90 (mL/min)  LIPASE, BLOOD      Component Value Range   Lipase 21  11 - 59 (U/L)   She got good relief of pain with hydromorphone in good relief of nausea with ondansetron. Laboratory workup is unremarkable. There's been a slight drop in hemoglobin over the last month and potassium is low. She was given an dose of oral potassium. Abdomen is reexamined and there is only mild epigastric tenderness. She is given a prescription for Percocet for pain and ondansetron for nausea and is to followup with her gastroenterologist in the next several days. Return to emergency Department symptoms  worsen.     1. Abdominal pain   2. Anemia   3. Hypokalemia       MDM  Abdominal pain with radiation to the back suspicious for pancreatitis. She'll be given IV fluids and IV narcotics and antiemetics. Laboratory workup has been initiated. Old records have been reviewed and she had a recent hospitalization with a duodenal stricture which had been dilated several times.        Dione Booze, MD 06/20/11 [phone]

## 2011-06-19 NOTE — ED Notes (Addendum)
Pt c/o severe upper abdominal pain radiating to her back and nausea.

## 2011-06-20 ENCOUNTER — Inpatient Hospital Stay (HOSPITAL_COMMUNITY)
Admission: EM | Admit: 2011-06-20 | Discharge: 2011-07-16 | DRG: 327 | Disposition: A | Discharge status: Home Health | Attending: General Surgery | Admitting: General Surgery

## 2011-06-20 ENCOUNTER — Emergency Department (HOSPITAL_COMMUNITY)

## 2011-06-20 ENCOUNTER — Encounter (HOSPITAL_COMMUNITY): Payer: Self-pay | Admitting: *Deleted

## 2011-06-20 DIAGNOSIS — R1013 Epigastric pain: Secondary | ICD-10-CM | POA: Diagnosis present

## 2011-06-20 DIAGNOSIS — I252 Old myocardial infarction: Secondary | ICD-10-CM

## 2011-06-20 DIAGNOSIS — IMO0002 Reserved for concepts with insufficient information to code with codable children: Secondary | ICD-10-CM | POA: Diagnosis not present

## 2011-06-20 DIAGNOSIS — M069 Rheumatoid arthritis, unspecified: Secondary | ICD-10-CM | POA: Diagnosis present

## 2011-06-20 DIAGNOSIS — K2980 Duodenitis without bleeding: Secondary | ICD-10-CM | POA: Diagnosis present

## 2011-06-20 DIAGNOSIS — K279 Peptic ulcer, site unspecified, unspecified as acute or chronic, without hemorrhage or perforation: Secondary | ICD-10-CM

## 2011-06-20 DIAGNOSIS — K267 Chronic duodenal ulcer without hemorrhage or perforation: Principal | ICD-10-CM | POA: Diagnosis present

## 2011-06-20 DIAGNOSIS — K315 Obstruction of duodenum: Secondary | ICD-10-CM

## 2011-06-20 DIAGNOSIS — Z01818 Encounter for other preprocedural examination: Secondary | ICD-10-CM

## 2011-06-20 DIAGNOSIS — R109 Unspecified abdominal pain: Secondary | ICD-10-CM | POA: Diagnosis present

## 2011-06-20 DIAGNOSIS — D72829 Elevated white blood cell count, unspecified: Secondary | ICD-10-CM | POA: Diagnosis not present

## 2011-06-20 DIAGNOSIS — E876 Hypokalemia: Secondary | ICD-10-CM | POA: Diagnosis present

## 2011-06-20 DIAGNOSIS — R509 Fever, unspecified: Secondary | ICD-10-CM | POA: Diagnosis not present

## 2011-06-20 DIAGNOSIS — D649 Anemia, unspecified: Secondary | ICD-10-CM | POA: Diagnosis present

## 2011-06-20 DIAGNOSIS — R000 Tachycardia, unspecified: Secondary | ICD-10-CM | POA: Diagnosis not present

## 2011-06-20 DIAGNOSIS — I959 Hypotension, unspecified: Secondary | ICD-10-CM | POA: Diagnosis not present

## 2011-06-20 DIAGNOSIS — K311 Adult hypertrophic pyloric stenosis: Secondary | ICD-10-CM | POA: Diagnosis present

## 2011-06-20 LAB — DIFFERENTIAL
Basophils Absolute: 0.1 10*3/uL (ref 0.0–0.1)
Eosinophils Absolute: 0.3 10*3/uL (ref 0.0–0.7)
Eosinophils Relative: 6 % — ABNORMAL HIGH (ref 0–5)
Eosinophils Relative: 8 % — ABNORMAL HIGH (ref 0–5)
Lymphocytes Relative: 31 % (ref 12–46)
Lymphocytes Relative: 41 % (ref 12–46)
Lymphs Abs: 1.4 10*3/uL (ref 0.7–4.0)
Lymphs Abs: 2.1 10*3/uL (ref 0.7–4.0)
Monocytes Absolute: 0.5 10*3/uL (ref 0.1–1.0)
Monocytes Absolute: 0.7 10*3/uL (ref 0.1–1.0)
Monocytes Relative: 12 % (ref 3–12)
Neutro Abs: 1.9 10*3/uL (ref 1.7–7.7)

## 2011-06-20 LAB — COMPREHENSIVE METABOLIC PANEL
ALT: 17 U/L (ref 0–35)
AST: 25 U/L (ref 0–37)
CO2: 31 mEq/L (ref 19–32)
Calcium: 9.4 mg/dL (ref 8.4–10.5)
Chloride: 99 mEq/L (ref 96–112)
GFR calc Af Amer: >90 mL/min (ref >90)
GFR calc non Af Amer: >90 mL/min (ref >90)
Glucose, Bld: 102 mg/dL — ABNORMAL HIGH (ref 70–99)
Sodium: 139 mEq/L (ref 135–145)
Total Bilirubin: 0.2 mg/dL — ABNORMAL LOW (ref 0.3–1.2)

## 2011-06-20 LAB — BASIC METABOLIC PANEL
BUN: 4 mg/dL — ABNORMAL LOW (ref 6–23)
CO2: 29 mEq/L (ref 19–32)
Calcium: 9.6 mg/dL (ref 8.4–10.5)
GFR calc non Af Amer: >90 mL/min (ref >90)
Glucose, Bld: 85 mg/dL (ref 70–99)

## 2011-06-20 LAB — CBC
HCT: 27.8 % — ABNORMAL LOW (ref 36.0–46.0)
HCT: 27.9 % — ABNORMAL LOW (ref 36.0–46.0)
Hemoglobin: 9.1 g/dL — ABNORMAL LOW (ref 12.0–15.0)
MCH: 28.4 pg (ref 26.0–34.0)
MCV: 86.9 fL (ref 78.0–100.0)
MCV: 87.2 fL (ref 78.0–100.0)
Platelets: 298 10*3/uL (ref 150–400)
RBC: 3.2 MIL/uL — ABNORMAL LOW (ref 3.87–5.11)
RBC: 3.2 MIL/uL — ABNORMAL LOW (ref 3.87–5.11)
WBC: 5.1 10*3/uL (ref 4.0–10.5)

## 2011-06-20 LAB — HEPATIC FUNCTION PANEL
Alkaline Phosphatase: 117 U/L (ref 39–117)
Bilirubin, Direct: <0.1 mg/dL (ref 0.0–0.3)
Total Bilirubin: 0.3 mg/dL (ref 0.3–1.2)

## 2011-06-20 LAB — LIPASE, BLOOD: Lipase: 16 U/L (ref 11–59)

## 2011-06-20 MED ORDER — NITROGLYCERIN 0.4 MG SL SUBL: 0.4000 mg | SUBLINGUAL_TABLET | SUBLINGUAL | Status: DC | PRN

## 2011-06-20 MED ORDER — ONDANSETRON HCL 4 MG PO TABS
4.0000 mg | ORAL_TABLET | Freq: Four times a day (QID) | ORAL | Status: DC | PRN
  Administered 2011-06-25: 4 mg via ORAL
  Filled 2011-06-20: qty 1

## 2011-06-20 MED ORDER — ONDANSETRON HCL 4 MG/2ML IJ SOLN
INTRAMUSCULAR | Status: AC
  Administered 2011-06-20: 4 mg via INTRAVENOUS
  Filled 2011-06-20: qty 2

## 2011-06-20 MED ORDER — LORAZEPAM 0.5 MG PO TABS
0.5000 mg | ORAL_TABLET | Freq: Two times a day (BID) | ORAL | Status: DC
  Administered 2011-06-20 – 2011-06-27 (×14): 0.5 mg via ORAL
  Filled 2011-06-20 (×14): qty 1

## 2011-06-20 MED ORDER — HYDROMORPHONE HCL PF 1 MG/ML IJ SOLN
INTRAMUSCULAR | Status: AC
  Administered 2011-06-20: 1 mg via INTRAVENOUS
  Filled 2011-06-20: qty 1

## 2011-06-20 MED ORDER — SODIUM CHLORIDE 0.9 % IV BOLUS (SEPSIS)
500.0000 mL | Freq: Once | INTRAVENOUS | Status: AC
  Administered 2011-06-20: 500 mL via INTRAVENOUS

## 2011-06-20 MED ORDER — ONDANSETRON HCL 4 MG/2ML IJ SOLN
4.0000 mg | Freq: Once | INTRAMUSCULAR | Status: AC
  Administered 2011-06-20: 4 mg via INTRAVENOUS

## 2011-06-20 MED ORDER — HYDROMORPHONE HCL PF 2 MG/ML IJ SOLN
INTRAMUSCULAR | Status: AC
  Administered 2011-06-20: 0.5 mg
  Filled 2011-06-20: qty 1

## 2011-06-20 MED ORDER — HYDROMORPHONE HCL PF 1 MG/ML IJ SOLN: 0.5000 mg | Freq: Once | INTRAMUSCULAR | Status: DC

## 2011-06-20 MED ORDER — AMLODIPINE BESYLATE 5 MG PO TABS
5.0000 mg | ORAL_TABLET | Freq: Every day | ORAL | Status: DC
  Administered 2011-06-20 – 2011-06-27 (×6): 5 mg via ORAL
  Filled 2011-06-20 (×8): qty 1

## 2011-06-20 MED ORDER — ONDANSETRON HCL 4 MG/2ML IJ SOLN
4.0000 mg | Freq: Four times a day (QID) | INTRAMUSCULAR | Status: DC | PRN
  Administered 2011-06-23 (×2): 4 mg via INTRAVENOUS
  Filled 2011-06-20 (×3): qty 2

## 2011-06-20 MED ORDER — POTASSIUM CHLORIDE IN NACL 40-0.9 MEQ/L-% IV SOLN
INTRAVENOUS | Status: DC
  Administered 2011-06-20 – 2011-06-21 (×2): via INTRAVENOUS
  Administered 2011-06-21 – 2011-06-22 (×2): 75 mL/h via INTRAVENOUS
  Administered 2011-06-23 – 2011-06-24 (×3): via INTRAVENOUS
  Administered 2011-06-25: 75 mL/h via INTRAVENOUS
  Administered 2011-06-26 – 2011-06-28 (×4): via INTRAVENOUS
  Filled 2011-06-20 (×18): qty 1000

## 2011-06-20 MED ORDER — HEPARIN SODIUM (PORCINE) 5000 UNIT/ML IJ SOLN
5000.0000 [IU] | Freq: Three times a day (TID) | INTRAMUSCULAR | Status: DC
  Administered 2011-06-20: 5000 [IU] via SUBCUTANEOUS
  Filled 2011-06-20: qty 1

## 2011-06-20 MED ORDER — PANTOPRAZOLE SODIUM 40 MG PO TBEC
40.0000 mg | DELAYED_RELEASE_TABLET | Freq: Every day | ORAL | Status: DC
  Administered 2011-06-20 – 2011-06-21 (×2): 40 mg via ORAL
  Filled 2011-06-20 (×2): qty 1

## 2011-06-20 MED ORDER — OXYCODONE-ACETAMINOPHEN 5-325 MG PO TABS: 1.0000 | ORAL_TABLET | ORAL | Status: AC | PRN

## 2011-06-20 MED ORDER — HYDROMORPHONE HCL PF 1 MG/ML IJ SOLN
1.0000 mg | Freq: Once | INTRAMUSCULAR | Status: AC
  Administered 2011-06-20: 1 mg via INTRAVENOUS
  Filled 2011-06-20: qty 1

## 2011-06-20 MED ORDER — ADALIMUMAB 40 MG/0.8ML ~~LOC~~ KIT
40.0000 mg | PACK | SUBCUTANEOUS | Status: DC
  Filled 2011-06-20 (×2): qty 0.8

## 2011-06-20 MED ORDER — QUETIAPINE FUMARATE 100 MG PO TABS
100.0000 mg | ORAL_TABLET | Freq: Two times a day (BID) | ORAL | Status: DC
  Administered 2011-06-20 – 2011-06-27 (×15): 100 mg via ORAL
  Filled 2011-06-20 (×19): qty 1

## 2011-06-20 MED ORDER — HYDROMORPHONE HCL PF 1 MG/ML IJ SOLN
1.0000 mg | Freq: Once | INTRAMUSCULAR | Status: AC
Start: 2011-06-20 — End: 2011-06-20
  Administered 2011-06-20: 1 mg via INTRAVENOUS

## 2011-06-20 MED ORDER — ONDANSETRON HCL 4 MG/2ML IJ SOLN
4.0000 mg | Freq: Once | INTRAMUSCULAR | Status: DC
  Filled 2011-06-20 (×6): qty 2

## 2011-06-20 MED ORDER — POTASSIUM CHLORIDE IN NACL 40-0.9 MEQ/L-% IV SOLN
INTRAVENOUS | Status: AC
  Filled 2011-06-20: qty 1000

## 2011-06-20 MED ORDER — POTASSIUM CHLORIDE CRYS ER 20 MEQ PO TBCR
40.0000 meq | EXTENDED_RELEASE_TABLET | Freq: Once | ORAL | Status: AC
  Administered 2011-06-20: 40 meq via ORAL
  Filled 2011-06-20: qty 2

## 2011-06-20 MED ORDER — LINACLOTIDE 145 MCG PO CAPS: 1.0000 | ORAL_CAPSULE | Freq: Every day | ORAL | Status: DC

## 2011-06-20 MED ORDER — OXYCODONE-ACETAMINOPHEN 5-325 MG PO TABS: 1.0000 | ORAL_TABLET | ORAL | Status: DC | PRN

## 2011-06-20 MED ORDER — SODIUM CHLORIDE 0.9 % IV SOLN
INTRAVENOUS | Status: DC
  Administered 2011-06-20: 18:00:00 via INTRAVENOUS

## 2011-06-20 MED ORDER — HYDROMORPHONE HCL PF 1 MG/ML IJ SOLN
1.0000 mg | INTRAMUSCULAR | Status: DC | PRN
  Administered 2011-06-20 – 2011-06-28 (×45): 1 mg via INTRAVENOUS
  Filled 2011-06-20 (×46): qty 1

## 2011-06-20 MED ORDER — ONDANSETRON HCL 4 MG PO TABS: 4.0000 mg | ORAL_TABLET | Freq: Four times a day (QID) | ORAL | Status: AC

## 2011-06-20 NOTE — ED Notes (Signed)
MD at bedside. 

## 2011-06-20 NOTE — ED Notes (Signed)
pts husband states pt had blood transfusion 3 days ago in [place] [facility]. and was seen last pm and treated here.

## 2011-06-20 NOTE — Progress Notes (Signed)
States severe pain. Medication not helping. Paged hospitalist on call. Orders given for another mg of dilaudid IV.

## 2011-06-20 NOTE — Discharge Instructions (Signed)
Followup with your gastroenterologist in the next several days. Return to the emergency department if symptoms are getting worse.  Abdominal Pain Abdominal pain can be caused by many things. Your caregiver decides the seriousness of your pain by an examination and possibly blood tests and X-rays. Many cases can be observed and treated at home. Most abdominal pain is not caused by a disease and will probably improve without treatment. However, in many cases, more time must pass before a clear cause of the pain can be found. Before that point, it may not be known if you need more testing, or if hospitalization or surgery is needed. HOME CARE INSTRUCTIONS   Do not take laxatives unless directed by your caregiver.   Take pain medicine only as directed by your caregiver.   Only take over-the-counter or prescription medicines for pain, discomfort, or fever as directed by your caregiver.   Try a clear liquid diet (broth, tea, or water) for as long as directed by your caregiver. Slowly move to a bland diet as tolerated.  SEEK IMMEDIATE MEDICAL CARE IF:   The pain does not go away.   You have a fever.   You keep throwing up (vomiting).   The pain is felt only in portions of the abdomen. Pain in the right side could possibly be appendicitis. In an adult, pain in the left lower portion of the abdomen could be colitis or diverticulitis.   You pass bloody or black tarry stools.  MAKE SURE YOU:   Understand these instructions.   Will watch your condition.   Will get help right away if you are not doing well or get worse.  Document Released: 12/16/2004 Document Revised: 02/25/2011 Document Reviewed: 10/25/2007 [facility] Patient Information 2012 [place], [place].  Acetaminophen; Oxycodone tablets What is this medicine? ACETAMINOPHEN; OXYCODONE (a set a MEE noe fen; ox i KOE done) is a pain reliever. It is used to treat mild to moderate pain. This medicine may be used for other purposes; ask your  health care provider or pharmacist if you have questions. What should I tell my health care provider before I take this medicine? They need to know if you have any of these conditions: -brain tumor -Crohn's disease, inflammatory bowel disease, or ulcerative colitis -drink more than 3 alcohol containing drinks per day -drug abuse or addiction -head injury -heart or circulation problems -kidney disease or problems going to the bathroom -liver disease -lung disease, asthma, or breathing problems -an unusual or allergic reaction to acetaminophen, oxycodone, other opioid analgesics, other medicines, foods, dyes, or preservatives -pregnant or trying to get pregnant -breast-feeding How should I use this medicine? Take this medicine by mouth with a full glass of water. Follow the directions on the prescription label. Take your medicine at regular intervals. Do not take your medicine more often than directed. Talk to your pediatrician regarding the use of this medicine in children. Special care may be needed. Patients over 78 years old may have a stronger reaction and need a smaller dose. Overdosage: If you think you have taken too much of this medicine contact a poison control center or emergency room at once. NOTE: This medicine is only for you. Do not share this medicine with others. What if I miss a dose? If you miss a dose, take it as soon as you can. If it is almost time for your next dose, take only that dose. Do not take double or extra doses. What may interact with this medicine? -alcohol or medicines  that contain alcohol -antihistamines -barbiturates like amobarbital, butalbital, butabarbital, methohexital, pentobarbital, phenobarbital, thiopental, and secobarbital -benztropine -drugs for bladder problems like solifenacin, trospium, oxybutynin, tolterodine, hyoscyamine, and methscopolamine -drugs for breathing problems like ipratropium and tiotropium -drugs for certain stomach or  intestine problems like propantheline, homatropine methylbromide, glycopyrrolate, atropine, belladonna, and dicyclomine -general anesthetics like etomidate, ketamine, nitrous oxide, propofol, desflurane, enflurane, halothane, isoflurane, and sevoflurane -medicines for depression, anxiety, or psychotic disturbances -medicines for pain like codeine, morphine, pentazocine, buprenorphine, butorphanol, nalbuphine, tramadol, and propoxyphene -medicines for sleep -muscle relaxants -naltrexone -phenothiazines like perphenazine, thioridazine, chlorpromazine, mesoridazine, fluphenazine, prochlorperazine, promazine, and trifluoperazine -scopolamine -trihexyphenidyl This list may not describe all possible interactions. Give your health care provider a list of all the medicines, herbs, non-prescription drugs, or dietary supplements you use. Also tell them if you smoke, drink alcohol, or use illegal drugs. Some items may interact with your medicine. What should I watch for while using this medicine? Tell your doctor or health care professional if your pain does not go away, if it gets worse, or if you have new or a different type of pain. You may develop tolerance to the medicine. Tolerance means that you will need a higher dose of the medication for pain relief. Tolerance is normal and is expected if you take this medicine for a long time. Do not suddenly stop taking your medicine because you may develop a severe reaction. Your body becomes used to the medicine. This does NOT mean you are addicted. Addiction is a behavior related to getting and using a drug for a nonmedical reason. If you have pain, you have a medical reason to take pain medicine. Your doctor will tell you how much medicine to take. If your doctor wants you to stop the medicine, the dose will be slowly lowered over time to avoid any side effects. You may get drowsy or dizzy. Do not drive, use machinery, or do anything that needs mental alertness  until you know how this medicine affects you. Do not stand or sit up quickly, especially if you are an older patient. This reduces the risk of dizzy or fainting spells. Alcohol may interfere with the effect of this medicine. Avoid alcoholic drinks. The medicine will cause constipation. Try to have a bowel movement at least every 2 to 3 days. If you do not have a bowel movement for 3 days, call your doctor or health care professional. Do not take Tylenol (acetaminophen) or medicines that have acetaminophen with this medicine. Too much acetaminophen can be very dangerous. Many nonprescription medicines contain acetaminophen. Always read the labels carefully to avoid taking more acetaminophen. What side effects may I notice from receiving this medicine? Side effects that you should report to your doctor or health care professional as soon as possible: -allergic reactions like skin rash, itching or hives, swelling of the face, lips, or tongue -breathing difficulties, wheezing -confusion -light headedness or fainting spells -severe stomach pain -yellowing of the skin or the whites of the eyes Side effects that usually do not require medical attention (report to your doctor or health care professional if they continue or are bothersome): -dizziness -drowsiness -nausea -vomiting This list may not describe all possible side effects. Call your doctor for medical advice about side effects. You may report side effects to FDA at 1-800-FDA-1088. Where should I keep my medicine? Keep out of the reach of children. This medicine can be abused. Keep your medicine in a safe place to protect it from theft. Do not share this  medicine with anyone. Selling or giving away this medicine is dangerous and against the law. Store at room temperature between 20 and 25 degrees C (68 and 77 degrees F). Keep container tightly closed. Protect from light. Flush any unused medicines down the toilet. Do not use the medicine after  the expiration date. NOTE: This sheet is a summary. It may not cover all possible information. If you have questions about this medicine, talk to your doctor, pharmacist, or health care provider.  2012, [employer] Standard. (02/05/2008 10:01:21 AM)  Ondansetron tablets What is this medicine? ONDANSETRON (on DAN se tron) is used to treat nausea and vomiting caused by chemotherapy. It is also used to prevent or treat nausea and vomiting after surgery. This medicine may be used for other purposes; ask your health care provider or pharmacist if you have questions. What should I tell my health care provider before I take this medicine? They need to know if you have any of these conditions: -heart disease -history of irregular heartbeat -liver disease -low levels of magnesium or potassium in the blood -an unusual or allergic reaction to ondansetron, granisetron, other medicines, foods, dyes, or preservatives -pregnant or trying to get pregnant -breast-feeding How should I use this medicine? Take this medicine by mouth with a glass of water. Follow the directions on your prescription label. Take your doses at regular intervals. Do not take your medicine more often than directed. Talk to your pediatrician regarding the use of this medicine in children. Special care may be needed. Overdosage: If you think you have taken too much of this medicine contact a poison control center or emergency room at once. NOTE: This medicine is only for you. Do not share this medicine with others. What if I miss a dose? If you miss a dose, take it as soon as you can. If it is almost time for your next dose, take only that dose. Do not take double or extra doses. What may interact with this medicine? Do not take this medicine with any of the following medications: -apomorphine  This medicine may also interact with the following medications: -carbamazepine -phenytoin -rifampicin -tramadol This list may not  describe all possible interactions. Give your health care provider a list of all the medicines, herbs, non-prescription drugs, or dietary supplements you use. Also tell them if you smoke, drink alcohol, or use illegal drugs. Some items may interact with your medicine. What should I watch for while using this medicine? Check with your doctor or health care professional right away if you have any sign of an allergic reaction. What side effects may I notice from receiving this medicine? Side effects that you should report to your doctor or health care professional as soon as possible: -allergic reactions like skin rash, itching or hives, swelling of the face, lips or tongue -breathing problems -dizziness -fast or irregular heartbeat -feeling faint or lightheaded, falls -fever and chills -swelling of the hands or feet -tightness in the chest Side effects that usually do not require medical attention (report to your doctor or health care professional if they continue or are bothersome): -constipation or diarrhea -headache This list may not describe all possible side effects. Call your doctor for medical advice about side effects. You may report side effects to FDA at 1-800-FDA-1088. Where should I keep my medicine? Keep out of the reach of children. Store between 2 and 30 degrees C (36 and 86 degrees F). Throw away any unused medicine after the expiration date. NOTE: This sheet  is a summary. It may not cover all possible information. If you have questions about this medicine, talk to your doctor, pharmacist, or health care provider.  2012, [employer]vier/Gold Standard. (12/12/2009 11:18:31 AM)

## 2011-06-20 NOTE — ED Provider Notes (Signed)
History   This chart was scribed for [employer]. Rubin Payor, MD scribed by Magnus Sinning. The patient was seen in room APA14/[facility]14 seen at 15:16   CSN: [id number]  Arrival date & time 06/20/11  1433   First MD Initiated Contact with Patient 06/20/11 1512      Chief Complaint  Patient presents with  . Abdominal Pain    (Consider location/radiation/quality/duration/timing/severity/associated sxs/prior treatment) HPI Jordan Haney is a 56 y.o. female who presents to the Emergency Department complaining of gradually worsening severe abd pain, onset last night. Patient adds that last week she was vomiting blood and reported to the ED via EMS and was given a 2 liter blood transfusion. Pt says she was also seen last night for abd pain and given a dilaudid shot and sent home. While in the ED, pt was informed that she has pancreatitis last night, but denies vomiting blood, or blood in stools present and states she is otherwise healthy at baseline.   PCP: Dr. Henreitta Leber   Past Medical History  Diagnosis Date  . RA (rheumatoid arthritis)  2008  . MI (myocardial infarction) 2008  . Constipation 03/21/2011  . Hypokalemia 03/21/2011  . Fatty liver 03/21/2011  . Pancreatitis     states 3 years ago, very severe, ?biliary pancreatitis   . T12 compression fracture 2011  . Duodenal ulcer     remote per patient. +BC powders, patient report negative EGD six months ago  . Dilated pancreatic duct     ?chronic pancreatitis, EUS pending (06/02/11)    Past Surgical History  Procedure Date  . Cholecystectomy   . Knee surgery   . Appendectomy   . Complete hysterectomy   . Ventral wall hernia   . Esophagogastroduodenoscopy 08/2010    Dr. Samuella Cota, [place]. small hh, mild prepylori gastritis. path unavailable  . Esophagogastroduodenoscopy 04/2010    Dr. Samuella Cota, [place]. small hh, mild distal esophagitis, antral gastritis and duodenitis. Bx mild chronic gastritis and no H.pylori  . Esophagogastroduodenoscopy  08/2009    Dr. Allena Katz, [place]. Moderate gastritis, moderate duodenitis with nodularity in proximal duodenal bulb, bx chronic gastritis, no helicobacter, mild chronic duodenitis  . Esophagogastroduodenoscopy 02/2007    Dr. Samuella Cota, [place]. antral gastritis and duodenal ulcers. Bx mild chronic gastritis. No bx from duodenal ulcer available.  Gaspar Bidding dilation 04/06/2011    Procedure: SAVORY DILATION;  Surgeon: Arlyce Harman, MD;  Location: [facility];  Service: Endoscopy;  Laterality: N/A;  16 mm  . Esophagogastroduodenoscopy 04/06/11    Dr. Darrick Penna: 1-2 cm hiatal hernia, mod gastritis, 52mmX3mm linear ulcer in duodenal bulb, stricture 1st part of duodenum, dilated to 12 mm  . Esophagogastroduodenoscopy 05/12/11    partial gastric oulet obstruction secondary to stricture between bulb/2nd portion of duodenum with marked friability and inflammation but no discrete ulcer, dilated stomach. s/p dilation but high risk for restenosis    Family History  Problem Relation Age of Onset  . Colon cancer Neg Hx   . Liver cancer Neg Hx   . Breast cancer Neg Hx   . Inflammatory bowel disease Neg Hx   . Heart attack Mother     History  Substance Use Topics  . Smoking status: Never Smoker   . Smokeless tobacco: Not on file  . Alcohol Use: No   Review of Systems  Gastrointestinal: Positive for abdominal pain. Negative for blood in stool.  All other systems reviewed and are negative.    Allergies  Compazine; Phenergan; and Vistaril  Home Medications  Current Outpatient Rx  Name Route Sig Dispense Refill  . AMLODIPINE BESYLATE 5 MG PO TABS Oral Take 1 tablet (5 mg total) by mouth daily. 30 tablet 0  . ESOMEPRAZOLE MAGNESIUM 40 MG PO CPDR Oral Take 40 mg by mouth daily before breakfast.      . LINACLOTIDE 145 MCG PO CAPS Oral Take 1 capsule by mouth daily. 1 PO DAILY 31 capsule 11  . LORAZEPAM 0.5 MG PO TABS Oral Take 0.5 mg by mouth 2 (two) times daily.    Marland Kitchen ONDANSETRON HCL 4 MG PO TABS Oral Take 1 tablet  (4 mg total) by mouth every 6 (six) hours. 12 tablet 0  . OXYCODONE-ACETAMINOPHEN 5-325 MG PO TABS Oral Take 1 tablet by mouth every 4 (four) hours as needed for pain. 20 tablet 0  . POLYETHYLENE GLYCOL 3350 PO PACK Oral Take 17 g by mouth daily.    . QUETIAPINE FUMARATE 100 MG PO TABS Oral Take 100 mg by mouth 2 (two) times daily.    . ADALIMUMAB 40 MG/0.8ML [place] KIT Subcutaneous Inject 40 mg into the skin every 7 (seven) days.     Marland Kitchen METOCLOPRAMIDE HCL 5 MG PO TABS Oral Take 1 tablet (5 mg total) by mouth 4 (four) times daily. 120 tablet 0  . NITROGLYCERIN 0.4 MG SL SUBL Sublingual Place 0.4 mg under the tongue every 5 (five) minutes as needed. Chest pain      BP 134/77  Pulse 83  Temp(Src) 98.9 F (37.2 C) (Oral)  Resp 20  SpO2 98%  Physical Exam  Nursing note and vitals reviewed. Constitutional: She is oriented to person, place, and time. She appears well-developed and well-nourished. No distress.       Appears uncomfortable. Patient is moaning.   HENT:  Head: Normocephalic and atraumatic.  Eyes: EOM are normal. Pupils are equal, round, and reactive to light.  Neck: Neck supple. No tracheal deviation present.  Cardiovascular: Normal rate.   Pulmonary/Chest: Effort normal and breath sounds normal. No respiratory distress. She has no wheezes. She has no rales.  Abdominal: Soft. She exhibits no distension. There is tenderness. There is no rebound and no guarding.  Musculoskeletal: Normal range of motion. She exhibits no edema.  Neurological: She is alert and oriented to person, place, and time. No sensory deficit.  Skin: Skin is warm and dry.  Psychiatric: She has a normal mood and affect. Her behavior is normal.    ED Course  Procedures (including critical care time) DIAGNOSTIC STUDIES: Oxygen Saturation is 100% on room air, normal by my interpretation.    COORDINATION OF CARE:  15:34: Physician consults with internist.   Labs Reviewed  CBC - Abnormal; Notable for the  following:    RBC 3.20 (*)    Hemoglobin 9.1 (*)    HCT 27.9 (*)    RDW 15.6 (*)    All other components within normal limits  DIFFERENTIAL - Abnormal; Notable for the following:    Eosinophils Relative 6 (*)    All other components within normal limits  BASIC METABOLIC PANEL - Abnormal; Notable for the following:    Potassium 3.0 (*)    BUN 4 (*)    All other components within normal limits  HEPATIC FUNCTION PANEL  LIPASE, BLOOD   No results found.   1. Abdominal pain       MDM  Acute on chronic abdominal pain. History of duodenal ulcers and pancreatitis. Seen last night in the ED and was sent home with  pain medication. She was diagnosed with pancreatitis despite a negative lipase. Patient's been uncontrolled pain at home. She'll be admitted to the hospital.  I personally performed the services described in this documentation, which was scribed in my presence. The recorded information has been reviewed and considered.          Juliet Rude. Rubin Payor, MD 06/20/11 [phone]

## 2011-06-20 NOTE — ED Notes (Signed)
Pt states she came in to ed last pd d/t umbilical pain. Pt states pain has gotten worse. Pt states she was dx with ulcers last pm.

## 2011-06-20 NOTE — H&P (Signed)
Jordan Haney MRN: [id number] DOB/AGE: 09-29-1955 56 y.o. Primary Care Physician:Bridges, Saul Fordyce, DO, DO Admit date: 06/20/2011 Chief Complaint: Abdominal pain. HPI: This 56 year old lady, who is well-known to the hospital, presents with the above symptoms for the last 24 hours or so. She was hospitalized in [place] approximately 5 days ago when she presented there with vomiting black vomitus. She apparently had to be given 1 unit of blood transfusion. She was subsequently discharged, having had endoscopy of some sort. We do not have medical records. She was sent home and then came to the emergency room here yesterday. She was diagnosed with acute pancreatitis, given intravenous and oral opioids and sent home. She now returns with the above symptoms, which have not improved. She does not really have any vomiting although she feels somewhat nauseous. 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 Kendell Bane and Dr Darrick Penna, gastroenterology.  Past Medical History  Diagnosis Date  . RA (rheumatoid arthritis)  2008  . MI (myocardial infarction) 2008  . Constipation 03/21/2011  . Hypokalemia 03/21/2011  . Fatty liver 03/21/2011  . Pancreatitis     states 3 years ago, very severe, ?biliary pancreatitis   . T12 compression fracture 2011  . Duodenal ulcer     remote per patient. +BC powders, patient report negative EGD six months ago  . Dilated pancreatic duct     ?chronic pancreatitis, EUS pending (06/02/11)   Past Surgical History  Procedure Date  . Cholecystectomy   . Knee surgery   . Appendectomy   . Complete hysterectomy   . Ventral wall hernia   . Esophagogastroduodenoscopy 08/2010    Dr. Samuella Cota, [place]. small hh, mild prepylori gastritis. path unavailable  . Esophagogastroduodenoscopy 04/2010    Dr. Samuella Cota, [place]. small hh, mild distal esophagitis, antral gastritis and duodenitis. Bx mild chronic gastritis and no H.pylori  . Esophagogastroduodenoscopy  08/2009    Dr. Allena Katz, [place]. Moderate gastritis, moderate duodenitis with nodularity in proximal duodenal bulb, bx chronic gastritis, no helicobacter, mild chronic duodenitis  . Esophagogastroduodenoscopy 02/2007    Dr. Samuella Cota, [place]. antral gastritis and duodenal ulcers. Bx mild chronic gastritis. No bx from duodenal ulcer available.  Gaspar Bidding dilation 04/06/2011    Procedure: SAVORY DILATION;  Surgeon: Arlyce Harman, MD;  Location: [facility];  Service: Endoscopy;  Laterality: N/A;  16 mm  . Esophagogastroduodenoscopy 04/06/11    Dr. Darrick Penna: 1-2 cm hiatal hernia, mod gastritis, 68mmX3mm linear ulcer in duodenal bulb, stricture 1st part of duodenum, dilated to 12 mm  . Esophagogastroduodenoscopy 05/12/11    partial gastric oulet obstruction secondary to stricture between bulb/2nd portion of duodenum with marked friability and inflammation but no discrete ulcer, dilated stomach. s/p dilation but high risk for restenosis        Family History  Problem Relation Age of Onset  . Colon cancer Neg Hx   . Liver cancer Neg Hx   . Breast cancer Neg Hx   . Inflammatory bowel disease Neg Hx   . Heart attack Mother     Social History:  reports that she has never smoked. She does not have any smokeless tobacco history on file. She reports that she does not drink alcohol or use illicit drugs.   Allergies:  Allergies  Allergen Reactions  . Compazine Shortness Of Breath    Tongue swelling  . Phenergan Shortness Of Breath    Tongue swelling  . Vistaril (Hyzine) Shortness Of Breath    Tongue swelling  Medications Prior to Admission  Medication Dose Route Frequency Provider Last Rate Last Dose  . 0.9 %  sodium chloride infusion   Intravenous Once Dione Booze, MD      . 0.9 %  sodium chloride infusion   Intravenous Continuous Juliet Rude. Pickering, MD      . HYDROmorphone (DILAUDID) 2 MG/ML injection        0.5 mg at 06/20/11 0238  . HYDROmorphone (DILAUDID) injection 1 mg  1 mg Intravenous Once  Dione Booze, MD   1 mg at 06/20/11 0005  . HYDROmorphone (DILAUDID) injection 1 mg  1 mg Intravenous Once Nathan R. Pickering, MD   1 mg at 06/20/11 1527  . ondansetron (ZOFRAN) injection 4 mg  4 mg Intravenous Once Dione Booze, MD   4 mg at 06/20/11 0005  . ondansetron (ZOFRAN) injection 4 mg  4 mg Intravenous Once [employer]. Pickering, MD   4 mg at 06/20/11 1459  . ondansetron (ZOFRAN) injection 4 mg  4 mg Intravenous Once [employer]. Pickering, MD      . potassium chloride SA (K-DUR,KLOR-CON) CR tablet 40 mEq  40 mEq Oral Once Dione Booze, MD   40 mEq at 06/20/11 0238  . sodium chloride 0.9 % bolus 500 mL  500 mL Intravenous Once Harrold Donath R. Pickering, MD      . DISCONTD: HYDROmorphone (DILAUDID) injection 0.5 mg  0.5 mg Intravenous Once Dione Booze, MD       Medications Prior to Admission  Medication Sig Dispense Refill  . amLODipine (NORVASC) 5 MG tablet Take 1 tablet (5 mg total) by mouth daily.  30 tablet  0  . esomeprazole (NEXIUM) 40 MG capsule Take 40 mg by mouth daily before breakfast.        . Linaclotide (LINZESS) 145 MCG CAPS Take 1 capsule by mouth daily. 1 PO DAILY  31 capsule  11  . LORazepam (ATIVAN) 0.5 MG tablet Take 0.5 mg by mouth 2 (two) times daily.      . ondansetron (ZOFRAN) 4 MG tablet Take 1 tablet (4 mg total) by mouth every 6 (six) hours.  12 tablet  0  . oxyCODONE-acetaminophen (PERCOCET) 5-325 MG per tablet Take 1 tablet by mouth every 4 (four) hours as needed for pain.  20 tablet  0  . polyethylene glycol (MIRALAX / GLYCOLAX) packet Take 17 g by mouth daily.      . QUEtiapine (SEROQUEL) 100 MG tablet Take 100 mg by mouth 2 (two) times daily.      Marland Kitchen adalimumab (HUMIRA) 40 MG/0.8ML injection Inject 40 mg into the skin every 7 (seven) days.       . metoCLOPramide (REGLAN) 5 MG tablet Take 1 tablet (5 mg total) by mouth 4 (four) times daily.  120 tablet  0  . nitroGLYCERIN (NITROSTAT) 0.4 MG SL tablet Place 0.4 mg under the tongue every 5 (five) minutes as needed. Chest  pain           ZOX:WRUEA from the symptoms mentioned above,there are no other symptoms referable to all systems reviewed.  Physical Exam: Blood pressure 134/77, pulse 83, temperature 98.9 F (37.2 C), temperature source Oral, resp. rate 20, SpO2 98.00%. She appears to be in pain. Abdomen is soft and tender in the epigastric area. Bowel sounds are heard. She does not particularly have rebound tenderness. She does have some guarding. There is no hepatosplenomegaly. There are no masses felt. Heart sounds are present and normal. Lung fields are clear. She is alert  and orientated.    Basename 06/20/11 1450 06/19/11 2345  WBC 4.4 5.1  NEUTROABS 2.2 1.9  HGB 9.1* 9.1*  HCT 27.9* 27.8*  MCV 87.2 86.9  PLT 295 298    Basename 06/20/11 1450 06/19/11 2345  NA 140 139  K 3.0* 3.0*  CL 99 99  CO2 29 31  GLUCOSE 85 102*  BUN 4* 4*  CREATININE 0.54 0.55  CALCIUM 9.6 9.4  MG -- --         Dg Ugi W/high Density W/kub  06/08/2011  *RADIOLOGY REPORT*  Clinical Data:  Prior esophageal strictures.  Prior EDD showing small duodenal ulcer and gastritis.  UPPER GI SERIES WITH KUB  Technique:  Routine upper GI series was performed with thin and high density barium.  Fluoroscopy Time: 4.9 minutes  Comparison:  Multiple exams, including 05/10/2011  Findings: Initial KUB demonstrates mild prominence of stool in the colon which may reflect constipation.  The pharyngeal phase of contrast appears normal.  Primary peristaltic waves of the esophagus were preserved on all swallows.  Mild proximal irregularity of the duodenal bulb noted, suspicious for a small duodenal ulcer, as shown on series 13.  Gastritis, if present, is occult on today's exam.  Mild fold irregularity in the transverse duodenum is of uncertain significance but could reflect low-level duodenitis.  The appearance is not considered highly characteristic of sprue.  There is mild smooth narrowing of the distal esophagus.  I was never able to  distend this beyond 16 mm.  A 13 mm barium tablet passed without difficulty into the stomach.  Superior compression fracture of T12 is again noted.  IMPRESSION:  1.  Small proximal duodenal bulb ulcer. Possible mild duodenitis. 2.  Smoothly marginated narrowing of the distal esophagus.  I was never able to distend this region beyond 16 mm. 3.  Possible constipation.  Original Report Authenticated By: Dellia Cloud, M.D.   Impression: 1. Abdominal pain, epigastric. Possible duodenitis. 2. History of duodenal stricture. Recent endoscopy in [place], duodenal ulcers patient. We do not have records. 3. Rheumatoid arthritis. 4. Symptoms out of proportion to physical signs from previous admission.     Plan: 1. Admit to medical floor. 2. Analgesia. 3. Ice chips. IV fluids. 4. Gastroenterology consultation tomorrow morning.      Wilson Singer Pager [phone]  06/20/2011, 3:52 PM

## 2011-06-21 ENCOUNTER — Encounter (HOSPITAL_COMMUNITY): Payer: Self-pay

## 2011-06-21 DIAGNOSIS — D649 Anemia, unspecified: Secondary | ICD-10-CM

## 2011-06-21 DIAGNOSIS — R112 Nausea with vomiting, unspecified: Secondary | ICD-10-CM

## 2011-06-21 DIAGNOSIS — K2980 Duodenitis without bleeding: Secondary | ICD-10-CM

## 2011-06-21 DIAGNOSIS — R109 Unspecified abdominal pain: Secondary | ICD-10-CM

## 2011-06-21 LAB — CBC
HCT: 26.1 % — ABNORMAL LOW (ref 36.0–46.0)
MCH: 27.9 pg (ref 26.0–34.0)
MCHC: 31.8 g/dL (ref 30.0–36.0)
MCV: 87.9 fL (ref 78.0–100.0)
Platelets: 309 10*3/uL (ref 150–400)
RDW: 15.8 % — ABNORMAL HIGH (ref 11.5–15.5)
WBC: 4.3 10*3/uL (ref 4.0–10.5)

## 2011-06-21 LAB — COMPREHENSIVE METABOLIC PANEL
ALT: 12 U/L (ref 0–35)
Albumin: 2.9 g/dL — ABNORMAL LOW (ref 3.5–5.2)
Alkaline Phosphatase: 100 U/L (ref 39–117)
BUN: <3 mg/dL — ABNORMAL LOW (ref 6–23)
Chloride: 105 mEq/L (ref 96–112)
Potassium: 3.2 mEq/L — ABNORMAL LOW (ref 3.5–5.1)
Sodium: 140 mEq/L (ref 135–145)
Total Bilirubin: 0.3 mg/dL (ref 0.3–1.2)
Total Protein: 5.4 g/dL — ABNORMAL LOW (ref 6.0–8.3)

## 2011-06-21 MED ORDER — PANTOPRAZOLE SODIUM 40 MG IV SOLR
40.0000 mg | Freq: Two times a day (BID) | INTRAVENOUS | Status: DC
  Administered 2011-06-21 – 2011-07-10 (×37): 40 mg via INTRAVENOUS
  Filled 2011-06-21 (×36): qty 40

## 2011-06-21 MED ORDER — PANTOPRAZOLE SODIUM 40 MG PO TBEC: 40.0000 mg | DELAYED_RELEASE_TABLET | Freq: Two times a day (BID) | ORAL | Status: DC

## 2011-06-21 MED ORDER — POTASSIUM CHLORIDE 20 MEQ/15ML (10%) PO LIQD
40.0000 meq | ORAL | Status: AC
  Administered 2011-06-21 (×2): 40 meq via ORAL
  Filled 2011-06-21 (×2): qty 30

## 2011-06-21 NOTE — Progress Notes (Signed)
Subjective: Patient describes severe epigastric pain, nausea but no vomiting, last BM was 2 days ago and was watery.  Reports worsening pain even after taking ice chips.  Objective: Vital signs in last 24 hours: Temp:  [98.1 F (36.7 C)-98.9 F (37.2 C)] 98.6 F (37 C) (04/01 0439) Pulse Rate:  [66-83] 66  (04/01 0959) Resp:  [16-22] 16  (04/01 0439) BP: (84-134)/(55-87) 114/74 mmHg (04/01 0959) SpO2:  [94 %-100 %] 94 % (04/01 0439) Weight:  [64.411 kg (142 lb)] 64.411 kg (142 lb) (03/31 1827) Weight change:  Last BM Date: 06/18/11  Intake/Output from previous day: 03/31 0701 - 04/01 0700 In: 942.9 [I.V.:201.3; IV Piggyback:741.7] Out: -      Physical Exam: General: Alert, awake, oriented x3, in no acute distress. HEENT: No bruits, no goiter. Heart: Regular rate and rhythm, without murmurs, rubs, gallops. Lungs: Clear to auscultation bilaterally. Abdomen: Soft, tender in epigastrium, nondistended, positive bowel sounds. Extremities: No clubbing cyanosis or edema with positive pedal pulses. Neuro: Grossly intact, nonfocal.    Lab Results: Basic Metabolic Panel:  Basename 06/21/11 0438 06/20/11 1450  NA 140 140  K 3.2* 3.0*  CL 105 99  CO2 28 29  GLUCOSE 90 85  BUN <3* 4*  CREATININE 0.46* 0.54  CALCIUM 8.8 9.6  MG -- --  PHOS -- --   Liver Function Tests:  [facility] 06/21/11 0438 06/20/11 1529  AST 19 23  ALT 12 16  ALKPHOS 100 117  BILITOT 0.3 0.3  PROT 5.4* 6.4  ALBUMIN 2.9* 3.4*    Basename 06/20/11 1529 06/19/11 2345  LIPASE 16 21  AMYLASE -- --   No results found for this basename: AMMONIA:2 in the last 72 hours CBC:  Basename 06/21/11 0438 06/20/11 1450 06/19/11 2345  WBC 4.3 4.4 --  NEUTROABS -- 2.2 1.9  HGB 8.3* 9.1* --  HCT 26.1* 27.9* --  MCV 87.9 87.2 --  PLT 309 295 --   Cardiac Enzymes: No results found for this basename: CKTOTAL:3,CKMB:3,CKMBINDEX:3,TROPONINI:3 in the last 72 hours BNP: No results found for this basename:  PROBNP:3 in the last 72 hours D-Dimer: No results found for this basename: DDIMER:2 in the last 72 hours CBG: No results found for this basename: GLUCAP:6 in the last 72 hours Hemoglobin A1C: No results found for this basename: HGBA1C in the last 72 hours Fasting Lipid Panel: No results found for this basename: CHOL,HDL,LDLCALC,TRIG,CHOLHDL,LDLDIRECT in the last 72 hours Thyroid Function Tests: No results found for this basename: TSH,T4TOTAL,FREET4,T3FREE,THYROIDAB in the last 72 hours Anemia Panel: No results found for this basename: VITAMINB12,FOLATE,FERRITIN,TIBC,IRON,RETICCTPCT in the last 72 hours Coagulation: No results found for this basename: LABPROT:2,INR:2 in the last 72 hours Urine Drug Screen: Drugs of Abuse  No results found for this basename: labopia, cocainscrnur, labbenz, amphetmu, thcu, labbarb    Alcohol Level: No results found for this basename: ETH:2 in the last 72 hours Urinalysis: No results found for this basename: COLORURINE:2,APPERANCEUR:2,LABSPEC:2,PHURINE:2,GLUCOSEU:2,HGBUR:2,BILIRUBINUR:2,KETONESUR:2,PROTEINUR:2,UROBILINOGEN:2,NITRITE:2,LEUKOCYTESUR:2 in the last 72 hours  No results found for this or any previous visit (from the past 240 hour(s)).  Studies/Results: Dg Abd Acute W/chest  06/20/2011  *RADIOLOGY REPORT*  Clinical Data: Abdominal pain  ACUTE ABDOMEN SERIES (ABDOMEN 2 VIEW & CHEST 1 VIEW)  Comparison: 06/08/2011  Findings: Heart size appears normal.  No pleural effusion or edema.  No airspace consolidation identified.  Cholecystectomy clips are noted within the right upper quadrant of the abdomen.  There is barium within the colon. Presumably,  this is from the upper GI performed  06/08/2011.  No abnormally dilated loops of small bowel identified.  There is no air-fluid levels identified.  No evidence for free air.  IMPRESSION:  1.  No acute cardiopulmonary abnormalities. 2.  Nonobstructive bowel gas pattern.  Original Report Authenticated By:  Rosealee Albee, M.D.    Medications: Scheduled Meds:   . adalimumab  40 mg Subcutaneous Q7 days  . amLODipine  5 mg Oral Daily  . heparin  5,000 Units Subcutaneous Q8H  .  HYDROmorphone (DILAUDID) injection  1 mg Intravenous Once  .  HYDROmorphone (DILAUDID) injection  1 mg Intravenous Once  . Linaclotide  1 capsule Oral Daily  . LORazepam  0.5 mg Oral BID  . ondansetron  4 mg Intravenous Once  . ondansetron  4 mg Intravenous Once  . pantoprazole  40 mg Oral BID AC  . QUEtiapine  100 mg Oral BID  . sodium chloride  500 mL Intravenous Once  . DISCONTD: pantoprazole  40 mg Oral Daily   Continuous Infusions:   . 0.9 % NaCl with KCl 40 mEq / L 75 mL/hr (06/21/11 1023)  . DISCONTD: sodium chloride 125 mL/hr at 06/20/11 1730   PRN Meds:.HYDROmorphone, nitroGLYCERIN, ondansetron (ZOFRAN) IV, ondansetron  Assessment/Plan:  Active Problems:  Abdominal pain  Rheumatoid arthritis  Duodenal stricture  Anemia  Plan:  1. Abdominal pain.  Possibly related to duodenitis/duodenal ulcer.  Would continue supportive treatment for now. GI has been consulted.  Defer repeating endoscopy to them. Continue Ice chips for now.  2. Anemia.  Recently admitted at [facility] for hematemesis.  Received 1 unit of blood over there.  Hemoglobin is on lower side but stable.  She has not noted any recurrence of bleeding.  3. Duodenal stricture, s/p dilatation in the past  4. Hypokalemia, replete.  5. Other medical issues are stable.    LOS: 1 day   Mane Consolo [facility] Pager: [phone] 06/21/2011, 10:30 AM

## 2011-06-21 NOTE — Progress Notes (Signed)
UR Chart Review Completed  

## 2011-06-21 NOTE — Progress Notes (Signed)
CARE MANAGEMENT NOTE 06/21/2011  Patient:  Jordan Haney, Jordan Haney   Account Number:  [id number]  Date Initiated:  06/21/2011  Documentation initiated by:  Rosemary Holms  Subjective/Objective Assessment:   Pt admitted with abdominal pain. PTA independent with ADL.     Action/Plan:   Spoke to pt at bedside. No HH needs identified at this time.   Anticipated DC Date:  06/23/2011   Anticipated DC Plan:  HOME/SELF CARE      DC Planning Services  CM consult      Choice offered to / List presented to:             Status of service:  In process, will continue to follow Medicare Important Message given?   (If response is "NO", the following Medicare IM given date fields will be blank) Date Medicare IM given:   Date Additional Medicare IM given:    Discharge Disposition:    Per UR Regulation:    If discussed at Long Length of Stay Meetings, dates discussed:    Comments:  06/21/11 1000 Missie Gehrig Leanord Hawking RN BSN

## 2011-06-21 NOTE — Consult Note (Addendum)
S/P DUODENAL DIL JAN & FEB 2013. PT PRESENTS WITH ABDOMINAL PAIN AND ANEMIA. EGD 4/2. IF STRICTURE PATENT, PT WILL NEED TCS 4/4. STRICT NPO EXCEPT MEDS. D/C HEPARIN. ADD BCDs.

## 2011-06-21 NOTE — Consult Note (Signed)
Referring Provider: Dr.  Kerry Hough Primary Care Physician:  Anson Oregon, DO, DO Primary Gastroenterologist:  Dr. Darrick Penna  Reason for Consultation:  Abdominal pain   HPI: Jordan Haney is a 56 y.o. female admitted w/ recurrent epigastric and generalized abdominal pain.  She tells me 9 days ago, she began to have nausea, vomiting, diarrhea 4-5 loose stools, and ended up in bathroom floor.  EMS was called & she was found to be hypotensive.  States hgb was low in ER thinks 7g & was sent to ICU for 1 unit blood transfusion at [facility].  Stayed for 4 days.  Felt better & was discharged.  2 days later, suddenly developed severe abdominal pain right above navel.  Denies vomiting, but has nausea.  Last BM 2 days normal without diarrhea, bleeding, or melena.  Keeping liquids down, but can't eat.  Pt states problems started when she increased miralax to TID.  Denies dysphagia or odynophagia.  Weight loss stated 20# in past year.  Denies NSAIDs.  Takes Nexium 40mg  daily.  Linzess once daily for constipation.  05/12/11 EGD->partial gastric oulet obstruction secondary to stricture between bulb/2nd portion of duodenum with marked friability and inflammation but no discrete ulcer, dilated stomach. s/p dilation but high risk for restenosis.  Lipase normal.  Pt has been seen recently & evaluated by Dr Darrick Penna & Tana Coast, [facility] for recurrent epigastric pain.  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  Hx chronically dilated pancreatic duct.  ?possible chronic pancreatitis scheduled for EUS w/ Dr Christella Hartigan 4/19.  GES planned but postponed.  3/19 UGIS shows small proximal duodenal bulb ulcer, duodenitis, constipation, narrowing in distal esophagus.  Hx IBS-C.      Past Medical History  Diagnosis Date  . RA (rheumatoid arthritis)  2008  . MI (myocardial infarction) 2008  . Constipation 03/21/2011  . Hypokalemia 03/21/2011  . Fatty liver 03/21/2011  . Pancreatitis     states 3 years ago, very severe,  ?biliary pancreatitis   . T12 compression fracture 2011  . Duodenal ulcer     remote per patient. +BC powders, patient report negative EGD six months ago  . Dilated pancreatic duct     ?chronic pancreatitis, EUS pending (06/02/11)    Past Surgical History  Procedure Date  . Cholecystectomy   . Knee surgery   . Appendectomy   . Complete hysterectomy   . Ventral wall hernia   . Esophagogastroduodenoscopy 08/2010    Dr. Samuella Cota, Versed. small hh, mild prepylori gastritis. path unavailable  . Esophagogastroduodenoscopy 04/2010    Dr. Samuella Cota, Versed. small hh, mild distal esophagitis, antral gastritis and duodenitis. Bx mild chronic gastritis and no H.pylori  . Esophagogastroduodenoscopy 08/2009    Dr. Allena Katz, Versed. Moderate gastritis, moderate duodenitis with nodularity in proximal duodenal bulb, bx chronic gastritis, no helicobacter, mild chronic duodenitis  . Esophagogastroduodenoscopy 02/2007    Dr. Samuella Cota, Versed. antral gastritis and duodenal ulcers. Bx mild chronic gastritis. No bx from duodenal ulcer available.  Gaspar Bidding dilation 04/06/2011    Procedure: SAVORY DILATION;  Surgeon: Arlyce Harman, MD;  Location: [facility];  Service: Endoscopy;  Laterality: N/A;  16 mm  . Esophagogastroduodenoscopy 04/06/11    Dr. Darrick Penna: 1-2 cm hiatal hernia, mod gastritis, 22mmX3mm linear ulcer in duodenal bulb, stricture 1st part of duodenum, dilated to 12 mm  . Esophagogastroduodenoscopy 05/12/11    partial gastric oulet obstruction secondary to stricture between bulb/2nd portion of duodenum with marked friability and inflammation but no discrete  ulcer, dilated stomach. s/p dilation but high risk for restenosis    Prior to Admission medications   Medication Sig Start Date End Date Taking? Authorizing Provider  amLODipine (NORVASC) 5 MG tablet Take 1 tablet (5 mg total) by mouth daily. 05/20/11 05/19/12 Yes Nimish Normajean Glasgow, MD  esomeprazole (NEXIUM) 40 MG capsule Take 40 mg by mouth daily before breakfast.      Yes Historical Provider, MD  Linaclotide (LINZESS) 145 MCG CAPS Take 1 capsule by mouth daily. 1 PO DAILY 04/06/11  Yes West Bali, MD  LORazepam (ATIVAN) 0.5 MG tablet Take 0.5 mg by mouth 2 (two) times daily.   Yes Historical Provider, MD  ondansetron (ZOFRAN) 4 MG tablet Take 1 tablet (4 mg total) by mouth every 6 (six) hours. 06/20/11 06/27/11 Yes Dione Booze, MD  oxyCODONE-acetaminophen (PERCOCET) 5-325 MG per tablet Take 1 tablet by mouth every 4 (four) hours as needed for pain. 06/20/11 06/30/11 Yes Dione Booze, MD  polyethylene glycol [facility] Ethelene Hal) packet Take 17 g by mouth daily. 04/05/11  Yes Tiffany Kocher, PA  QUEtiapine (SEROQUEL) 100 MG tablet Take 100 mg by mouth 2 (two) times daily.   Yes Historical Provider, MD  adalimumab (HUMIRA) 40 MG/0.8ML injection Inject 40 mg into the skin every 7 (seven) days.     Historical Provider, MD  metoCLOPramide (REGLAN) 5 MG tablet Take 1 tablet (5 mg total) by mouth 4 (four) times daily. 05/20/11 05/30/11  Nimish Normajean Glasgow, MD  nitroGLYCERIN (NITROSTAT) 0.4 MG SL tablet Place 0.4 mg under the tongue every 5 (five) minutes as needed. Chest pain    Historical Provider, MD    Current Facility-Administered Medications  Medication Dose Route Frequency Provider Last Rate Last Dose  . 0.9 % NaCl with KCl 40 mEq / L  infusion   Intravenous Continuous Wilson Singer, MD 75 mL/hr at 06/20/11 2019    . adalimumab (HUMIRA) injection 40 mg  40 mg Subcutaneous Q7 days Nimish C Gosrani, MD      . amLODipine (NORVASC) tablet 5 mg  5 mg Oral Daily Nimish C Gosrani, MD   5 mg at 06/20/11 2018  . heparin injection 5,000 Units  5,000 Units Subcutaneous Q8H Wilson Singer, MD   5,000 Units at 06/20/11 2310  . HYDROmorphone (DILAUDID) injection 1 mg  1 mg Intravenous Once Nathan R. Pickering, MD   1 mg at 06/20/11 1527  . HYDROmorphone (DILAUDID) injection 1 mg  1 mg Intravenous Q3H PRN Wilson Singer, MD   1 mg at 06/21/11 0824  . HYDROmorphone (DILAUDID)  injection 1 mg  1 mg Intravenous Once Haydee Monica, MD   1 mg at 06/20/11 2226  . Linaclotide CAPS 1 capsule  1 capsule Oral Daily Nimish C Gosrani, MD      . LORazepam (ATIVAN) tablet 0.5 mg  0.5 mg Oral BID Nimish C Karilyn Cota, MD   0.5 mg at 06/20/11 2310  . nitroGLYCERIN (NITROSTAT) SL tablet 0.4 mg  0.4 mg Sublingual Q5 min PRN Nimish C Gosrani, MD      . ondansetron (ZOFRAN) injection 4 mg  4 mg Intravenous Once Harrold Donath R. Pickering, MD   4 mg at 06/20/11 1459  . ondansetron (ZOFRAN) injection 4 mg  4 mg Intravenous Once [employer]. Pickering, MD      . ondansetron [facility]) tablet 4 mg  4 mg Oral Q6H PRN Nimish Normajean Glasgow, MD       Or  . ondansetron (ZOFRAN) injection  4 mg  4 mg Intravenous Q6H PRN Nimish C Gosrani, MD      . pantoprazole (PROTONIX) EC tablet 40 mg  40 mg Oral Daily Wilson Singer, MD   40 mg at 06/20/11 2019  . QUEtiapine (SEROQUEL) tablet 100 mg  100 mg Oral BID Wilson Singer, MD   100 mg at 06/20/11 2310  . sodium chloride 0.9 % bolus 500 mL  500 mL Intravenous Once Harrold Donath R. Pickering, MD   500 mL at 06/20/11 1731  . DISCONTD: 0.9 %  sodium chloride infusion   Intravenous Continuous Juliet Rude. Rubin Payor, MD 125 mL/hr at 06/20/11 1730      Allergies as of 06/20/2011 - Review Complete 06/20/2011  Allergen Reaction Noted  . Compazine Shortness Of Breath 03/20/2011  . Phenergan Shortness Of Breath 03/20/2011  . Vistaril (hyzine) Shortness Of Breath 03/20/2011    Family History  Problem Relation Age of Onset  . Colon cancer Neg Hx   . Liver cancer Neg Hx   . Breast cancer Neg Hx   . Inflammatory bowel disease Neg Hx   . Heart attack Mother     History   Social History  . Marital Status: Divorced    Spouse Name: N/A    Number of Children: 1  . Years of Education: N/A   Occupational History  . unemployed    Social History Main Topics  . Smoking status: Never Smoker   . Smokeless tobacco: Not on file  . Alcohol Use: No  . Drug Use: No  . Sexually  Active:   Review of Systems: See HPI, otherwise negative complete ROS  Physical Exam: Vital signs in last 24 hours: Temp:  [98.1 F (36.7 C)-98.9 F (37.2 C)] 98.6 F (37 C) (04/01 0439) Pulse Rate:  [74-83] 78  (04/01 0439) Resp:  [16-22] 16  (04/01 0439) BP: (84-134)/(55-87) 84/55 mmHg (04/01 0439) SpO2:  [94 %-100 %] 94 % (04/01 0439) Weight:  [142 lb (64.411 kg)] 142 lb (64.411 kg) (03/31 1827) Last BM Date: 06/18/11 General:   Alert,  Well-developed, well-nourished, pleasant and cooperative in NAD. Head:  Normocephalic and atraumatic. Eyes:  Sclera clear, no icterus.   Conjunctiva pink. Ears:  Normal auditory acuity. Nose:  No deformity, discharge, or lesions. Mouth:  No deformity or lesions,oropharynx pink & moist. Neck:  Supple; no masses or thyromegaly. Lungs:  Clear throughout to auscultation.   No wheezes, crackles, or rhonchi. No acute distress. Heart:  Regular rate and rhythm; no murmurs, clicks, rubs,  or gallops. Abdomen:  Normal bowel sounds.  No bruits.  Nondistended. Soft, + tenderness to entire abdomen on light palpation, worse epigastrium without masses, hepatosplenomegaly or hernias noted.  Rectal:  Deferred. Msk:  Symmetrical without gross deformities. Normal posture. Pulses:  Normal pulses noted. Extremities:  No clubbing or edema. Neurologic:  Alert and oriented x4;  grossly normal neurologically. Skin:  Intact without significant lesions or rashes. Lymph Nodes:  No significant cervical adenopathy. Psych:  Alert and cooperative. Normal mood and affect.  Intake/Output from previous day: 03/31 0701 - 04/01 0700 In: 942.9 [I.V.:201.3; IV Piggyback:741.7] Out: -  Intake/Output this shift:    Lab Results:  Basename 06/21/11 0438 06/20/11 1450 06/19/11 2345  WBC 4.3 4.4 5.1  HGB 8.3* 9.1* 9.1*  HCT 26.1* 27.9* 27.8*  PLT 309 295 298   BMET  Basename 06/21/11 0438 06/20/11 1450 06/19/11 2345  NA 140 140 139  K 3.2* 3.0* 3.0*  CL 105 99 99  CO2  28 29 31   GLUCOSE 90 85 102*  BUN <3* 4* 4*  CREATININE 0.46* 0.54 0.55  CALCIUM 8.8 9.6 9.4   LFT  Basename 06/21/11 0438 06/20/11 1529 06/19/11 2345  PROT 5.4* 6.4 6.4  ALBUMIN 2.9* 3.4* 3.4*  AST 19 23 25   ALT 12 16 17   ALKPHOS 100 117 121*  BILITOT 0.3 0.3 0.2*  BILIDIR -- <0.1 --  IBILI -- NOT CALCULATED --  LIPASE -- 16 21  AMYLASE -- -- --   Studies/Results: Dg Abd Acute W/chest  06/20/2011  *RADIOLOGY REPORT*  Clinical Data: Abdominal pain  ACUTE ABDOMEN SERIES (ABDOMEN 2 VIEW & CHEST 1 VIEW)  Comparison: 06/08/2011  Findings: Heart size appears normal.  No pleural effusion or edema.  No airspace consolidation identified.  Cholecystectomy clips are noted within the right upper quadrant of the abdomen.  There is barium within the colon. Presumably,  this is from the upper GI performed 06/08/2011.  No abnormally dilated loops of small bowel identified.  There is no air-fluid levels identified.  No evidence for free air.  IMPRESSION:  1.  No acute cardiopulmonary abnormalities. 2.  Nonobstructive bowel gas pattern.  Original Report Authenticated By: Rosealee Albee, M.D.    Impression: Jordan Haney is a pleasant 56 y.o. female admitted with anemia and severe abdominal pain.  She is status post one unit transfusion a little over a week ago at [facility]. 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 Last EGD 2/20 showed partial gastric oulet obstruction secondary to stricture between bulb/2nd portion of duodenum with marked friability and inflammation but no discrete ulcer, dilated stomach.  I suspect duodenal ulcer & duodenitis as seen on upper GI series.  Currently no active vomiting or signs of gastric outlet obstruction.  Hx chronically dilated pancreatic duct.  There has been some question of possible chronic pancreatitis over the past several months, however she has not had documented elevated lipase or pancreatitis on CT recently. I am not  convinced she has chronic pancreatitis at this point. However she is scheduled for EUS w/ Dr Christella Hartigan 4/19.  GES was planned for later this month but is now postponed.    Hx IBS-C.  Constipation is no doubt complicating her abdominal pain. She has never had a colonoscopy & will need one to complete her anemia/abdominal pain workup.  She has a mild hypokalemia  Plan:  (Discussed with Dr. Darrick Penna) 1. Colonoscopy Wednesday with Dr. Darrick Penna 2. She will need 2 day gentle prep with MiraLax 3. Consider surgical evaluation and possible diagnostic laparoscopy if nothing is found on colonoscopy to explain her symptoms 4. No indication for EGD at this time unless hemoglobin drops or she develops gross bleeding 5. Increase PPI to BID 6. Continue supportive measures including IV fluids, pain control, and anti-emetics 7. EUS as as planned outpatient 8. Follow hemoglobin 9. Potassium repletion per attending   LOS: 1 day   Lorenza Burton  06/21/2011, 9:32 AM [facility]  We would like to thank you for the opportunity to participate in the care of Jordan Haney.

## 2011-06-22 ENCOUNTER — Encounter (HOSPITAL_COMMUNITY): Payer: Self-pay | Admitting: *Deleted

## 2011-06-22 ENCOUNTER — Encounter (HOSPITAL_COMMUNITY)

## 2011-06-22 ENCOUNTER — Encounter (HOSPITAL_COMMUNITY)
Admission: EM | Disposition: A | Discharge status: Home Health | Payer: Self-pay | Source: Home / Self Care | Attending: General Surgery

## 2011-06-22 DIAGNOSIS — D649 Anemia, unspecified: Secondary | ICD-10-CM

## 2011-06-22 DIAGNOSIS — R1110 Vomiting, unspecified: Secondary | ICD-10-CM

## 2011-06-22 DIAGNOSIS — R109 Unspecified abdominal pain: Secondary | ICD-10-CM

## 2011-06-22 DIAGNOSIS — K269 Duodenal ulcer, unspecified as acute or chronic, without hemorrhage or perforation: Secondary | ICD-10-CM

## 2011-06-22 HISTORY — PX: ESOPHAGOGASTRODUODENOSCOPY: SHX5428

## 2011-06-22 LAB — CBC
HCT: 27.9 % — ABNORMAL LOW (ref 36.0–46.0)
MCHC: 31.9 g/dL (ref 30.0–36.0)
Platelets: 326 10*3/uL (ref 150–400)
RDW: 15.7 % — ABNORMAL HIGH (ref 11.5–15.5)
WBC: 4.5 10*3/uL (ref 4.0–10.5)

## 2011-06-22 LAB — BASIC METABOLIC PANEL
BUN: 3 mg/dL — ABNORMAL LOW (ref 6–23)
Calcium: 9.2 mg/dL (ref 8.4–10.5)
Creatinine, Ser: 0.55 mg/dL (ref 0.50–1.10)
GFR calc Af Amer: >90 mL/min (ref >90)
GFR calc non Af Amer: >90 mL/min (ref >90)

## 2011-06-22 SURGERY — EGD (ESOPHAGOGASTRODUODENOSCOPY)
Anesthesia: Moderate Sedation

## 2011-06-22 MED ORDER — MINERAL OIL PO OIL
TOPICAL_OIL | ORAL | Status: AC
  Filled 2011-06-22: qty 30

## 2011-06-22 MED ORDER — SODIUM CHLORIDE 0.45 % IV SOLN
INTRAVENOUS | Status: DC
  Administered 2011-06-22: 12:00:00 via INTRAVENOUS

## 2011-06-22 MED ORDER — MEPERIDINE HCL 100 MG/ML IJ SOLN
INTRAMUSCULAR | Status: DC | PRN
  Administered 2011-06-22: 25 mg via INTRAVENOUS
  Administered 2011-06-22: 50 mg via INTRAVENOUS
  Administered 2011-06-22: 25 mg via INTRAVENOUS

## 2011-06-22 MED ORDER — MIDAZOLAM HCL 5 MG/5ML IJ SOLN
INTRAMUSCULAR | Status: DC | PRN
  Administered 2011-06-22: 2 mg via INTRAVENOUS
  Administered 2011-06-22: 1 mg via INTRAVENOUS
  Administered 2011-06-22: 2 mg via INTRAVENOUS

## 2011-06-22 MED ORDER — STERILE WATER FOR IRRIGATION IR SOLN
Status: DC | PRN
  Administered 2011-06-22: 12:00:00

## 2011-06-22 MED ORDER — MIDAZOLAM HCL 5 MG/5ML IJ SOLN
INTRAMUSCULAR | Status: AC
  Filled 2011-06-22: qty 10

## 2011-06-22 MED ORDER — MEPERIDINE HCL 100 MG/ML IJ SOLN
INTRAMUSCULAR | Status: AC
  Filled 2011-06-22: qty 1

## 2011-06-22 MED ORDER — SODIUM CHLORIDE 0.9 % IJ SOLN
INTRAMUSCULAR | Status: AC
  Administered 2011-06-22: 10 mL
  Filled 2011-06-22: qty 3

## 2011-06-22 MED ORDER — SODIUM CHLORIDE 0.9 % IJ SOLN
INTRAMUSCULAR | Status: AC
  Administered 2011-06-22: 08:00:00
  Filled 2011-06-22: qty 3

## 2011-06-22 MED ORDER — BUTAMBEN-TETRACAINE-BENZOCAINE 2-2-14 % EX AERO
INHALATION_SPRAY | CUTANEOUS | Status: DC | PRN
  Administered 2011-06-22: 1 via TOPICAL

## 2011-06-22 NOTE — Consult Note (Signed)
Reason for Consult:Chronic duodenal ulcer and stricture Referring Physician: Dr. Dellis Filbert is an 56 y.o. female.  HPI: Patient presented to [facility] with fatigue and anemia. She has had several episodes over the last couple months.  She has been hospitalized on several occassions.  She has required multiple blood transfusions although she has not received any today. She has had 3 EGD over the last couple months with dilatation of both a duodenal as well as an esophageal sticture.  She has a know duodenal ulcer.  It has been endoscopically coagulated previously with no resolution of symptoms.  +nausea.  + coffee ground emesis.  Occasional melanic stools. She has been on PPI therapy without improvement.  No family history of ulcers or gastric cancers.  Denies weight loss.  Past Medical History  Diagnosis Date  . RA (rheumatoid arthritis)  2008  . MI (myocardial infarction) 2008  . Constipation 03/21/2011  . Hypokalemia 03/21/2011  . Fatty liver 03/21/2011  . Pancreatitis     states 3 years ago, very severe, ?biliary pancreatitis   . T12 compression fracture 2011  . Duodenal ulcer     remote per patient. +BC powders, patient report negative EGD six months ago  . Dilated pancreatic duct     ?chronic pancreatitis, EUS pending (06/02/11)    Past Surgical History  Procedure Date  . Cholecystectomy   . Knee surgery   . Appendectomy   . Complete hysterectomy   . Ventral wall hernia   . Esophagogastroduodenoscopy 08/2010    Dr. Samuella Cota, Versed. small hh, mild prepylori gastritis. path unavailable  . Esophagogastroduodenoscopy 04/2010    Dr. Samuella Cota, Versed. small hh, mild distal esophagitis, antral gastritis and duodenitis. Bx mild chronic gastritis and no H.pylori  . Esophagogastroduodenoscopy 08/2009    Dr. Allena Katz, Versed. Moderate gastritis, moderate duodenitis with nodularity in proximal duodenal bulb, bx chronic gastritis, no helicobacter, mild chronic duodenitis  .  Esophagogastroduodenoscopy 02/2007    Dr. Samuella Cota, Versed. antral gastritis and duodenal ulcers. Bx mild chronic gastritis. No bx from duodenal ulcer available.  Gaspar Bidding dilation 04/06/2011    Procedure: SAVORY DILATION;  Surgeon: Arlyce Harman, MD;  Location: [facility];  Service: Endoscopy;  Laterality: N/A;  16 mm  . Esophagogastroduodenoscopy 04/06/11    Dr. Darrick Penna: 1-2 cm hiatal hernia, mod gastritis, 59mmX3mm linear ulcer in duodenal bulb, stricture 1st part of duodenum, dilated to 12 mm  . Esophagogastroduodenoscopy 05/12/11    partial gastric oulet obstruction secondary to stricture between bulb/2nd portion of duodenum with marked friability and inflammation but no discrete ulcer, dilated stomach. s/p dilation but high risk for restenosis    Family History  Problem Relation Age of Onset  . Colon cancer Neg Hx   . Liver cancer Neg Hx   . Breast cancer Neg Hx   . Inflammatory bowel disease Neg Hx   . Heart attack Mother     Social History:  reports that she has never smoked. She does not have any smokeless tobacco history on file. She reports that she does not drink alcohol or use illicit drugs.  Allergies:  Allergies  Allergen Reactions  . Compazine Shortness Of Breath    Tongue swelling  . Phenergan Shortness Of Breath    Tongue swelling  . Vistaril (Hyzine) Shortness Of Breath    Tongue swelling    Medications:  I have reviewed the patient's current medications. Prior to Admission:  Prescriptions prior to admission  Medication Sig Dispense Refill  . amLODipine (  NORVASC) 5 MG tablet Take 1 tablet (5 mg total) by mouth daily.  30 tablet  0  . esomeprazole (NEXIUM) 40 MG capsule Take 40 mg by mouth daily before breakfast.        . Linaclotide (LINZESS) 145 MCG CAPS Take 1 capsule by mouth daily. 1 PO DAILY  31 capsule  11  . LORazepam (ATIVAN) 0.5 MG tablet Take 0.5 mg by mouth 2 (two) times daily.      . ondansetron (ZOFRAN) 4 MG tablet Take 1 tablet (4 mg total) by mouth  every 6 (six) hours.  12 tablet  0  . oxyCODONE-acetaminophen (PERCOCET) 5-325 MG per tablet Take 1 tablet by mouth every 4 (four) hours as needed for pain.  20 tablet  0  . polyethylene glycol (MIRALAX / GLYCOLAX) packet Take 17 g by mouth daily.      . QUEtiapine (SEROQUEL) 100 MG tablet Take 100 mg by mouth 2 (two) times daily.      Marland Kitchen adalimumab (HUMIRA) 40 MG/0.8ML injection Inject 40 mg into the skin every 7 (seven) days.       . metoCLOPramide (REGLAN) 5 MG tablet Take 1 tablet (5 mg total) by mouth 4 (four) times daily.  120 tablet  0  . nitroGLYCERIN (NITROSTAT) 0.4 MG SL tablet Place 0.4 mg under the tongue every 5 (five) minutes as needed. Chest pain       Scheduled:   . adalimumab  40 mg Subcutaneous Q7 days  . amLODipine  5 mg Oral Daily  . LORazepam  0.5 mg Oral BID  . meperidine      . midazolam      . ondansetron  4 mg Intravenous Once  . pantoprazole (PROTONIX) IV  40 mg Intravenous BID AC  . QUEtiapine  100 mg Oral BID  . sodium chloride      . sodium chloride       Continuous:   . 0.9 % NaCl with KCl 40 mEq / L 75 mL/hr (06/22/11 1454)  . DISCONTD: sodium chloride 50 mL/hr at 06/22/11 1132   ZOX:[id number], nitroGLYCERIN, ondansetron (ZOFRAN) IV, ondansetron, DISCONTD: butamben-tetracaine-benzocaine, DISCONTD: meperidine, DISCONTD: midazolam, DISCONTD: simethicone susp in sterile water 1000 mL irrigation  Results for orders placed during the hospital encounter of 06/20/11 (from the past 48 hour(s))  COMPREHENSIVE METABOLIC PANEL     Status: Abnormal   Collection Time   06/21/11  4:38 AM      Component Value Range Comment   Sodium 140  135 - 145 (mEq/L)    Potassium 3.2 (*) 3.5 - 5.1 (mEq/L)    Chloride 105  96 - 112 (mEq/L)    CO2 28  19 - 32 (mEq/L)    Glucose, Bld 90  70 - 99 (mg/dL)    BUN <3 (*) 6 - 23 (mg/dL)    Creatinine, Ser 1.47 (*) 0.50 - 1.10 (mg/dL)    Calcium 8.8  8.4 - 10.5 (mg/dL)    Total Protein 5.4 (*) 6.0 - 8.3 (g/dL)    Albumin 2.9 (*)  3.5 - 5.2 (g/dL)    AST 19  0 - 37 (U/L)    ALT 12  0 - 35 (U/L)    Alkaline Phosphatase 100  39 - 117 (U/L)    Total Bilirubin 0.3  0.3 - 1.2 (mg/dL)    GFR calc non Af Amer >90  >90 (mL/min)    GFR calc Af Amer >90  >90 (mL/min)   CBC     Status:  Abnormal   Collection Time   06/21/11  4:38 AM      Component Value Range Comment   WBC 4.3  4.0 - 10.5 (K/uL)    RBC 2.97 (*) 3.87 - 5.11 (MIL/uL)    Hemoglobin 8.3 (*) 12.0 - 15.0 (g/dL)    HCT 16.1 (*) 09.6 - 46.0 (%)    MCV 87.9  78.0 - 100.0 (fL)    MCH 27.9  26.0 - 34.0 (pg)    MCHC 31.8  30.0 - 36.0 (g/dL)    RDW 04.5 (*) 40.9 - 15.5 (%)    Platelets 309  150 - 400 (K/uL)   BASIC METABOLIC PANEL     Status: Abnormal   Collection Time   06/22/11  6:16 AM      Component Value Range Comment   Sodium 139  135 - 145 (mEq/L)    Potassium 4.0  3.5 - 5.1 (mEq/L) DELTA CHECK NOTED   Chloride 102  96 - 112 (mEq/L)    CO2 28  19 - 32 (mEq/L)    Glucose, Bld 81  70 - 99 (mg/dL)    BUN 3 (*) 6 - 23 (mg/dL)    Creatinine, Ser 8.11  0.50 - 1.10 (mg/dL)    Calcium 9.2  8.4 - 10.5 (mg/dL)    GFR calc non Af Amer >90  >90 (mL/min)    GFR calc Af Amer >90  >90 (mL/min)   CBC     Status: Abnormal   Collection Time   06/22/11  6:16 AM      Component Value Range Comment   WBC 4.5  4.0 - 10.5 (K/uL)    RBC 3.18 (*) 3.87 - 5.11 (MIL/uL)    Hemoglobin 8.9 (*) 12.0 - 15.0 (g/dL)    HCT 91.4 (*) 78.2 - 46.0 (%)    MCV 87.7  78.0 - 100.0 (fL)    MCH 28.0  26.0 - 34.0 (pg)    MCHC 31.9  30.0 - 36.0 (g/dL)    RDW 95.6 (*) 21.3 - 15.5 (%)    Platelets 326  150 - 400 (K/uL)     No results found.  Review of Systems  Constitutional: Positive for weight loss and malaise/fatigue. Negative for fever and chills.  HENT: Negative.   Eyes: Negative.   Respiratory: Negative.   Cardiovascular: Negative.   Gastrointestinal: Positive for heartburn, nausea, vomiting, abdominal pain (diffuse) and melena (occassion). Negative for diarrhea, constipation and blood  in stool.  Genitourinary: Negative.   Musculoskeletal: Negative.   Skin: Negative.   Neurological: Positive for dizziness.  Endo/Heme/Allergies: Negative.   Psychiatric/Behavioral: Negative.    Blood pressure 99/65, pulse 82, temperature 98.3 F (36.8 C), temperature source Oral, resp. rate 16, height 5\' 4"  (1.626 m), weight 64.411 kg (142 lb), SpO2 90.00%. Physical Exam  Constitutional: She is oriented to person, place, and time. She appears well-developed and well-nourished. No distress.       Obese, pale  HENT:  Head: Normocephalic and atraumatic.  Eyes: Conjunctivae and EOM are normal. Pupils are equal, round, and reactive to light. No scleral icterus.  Neck: Normal range of motion. Neck supple. No tracheal deviation present. No thyromegaly present.  Cardiovascular: Normal rate, regular rhythm and normal heart sounds.   Respiratory: Effort normal and breath sounds normal. No respiratory distress. She has no wheezes.  GI: Soft. Bowel sounds are normal. She exhibits no distension and no mass. There is tenderness (epigastric). There is no rebound and no guarding.  obese  Lymphadenopathy:    She has no cervical adenopathy.  Neurological: She is alert and oriented to person, place, and time.  Skin: Skin is warm and dry.    Assessment/Plan: Duodenal ulcer.  Gastrin levels are pending.  As patient has been off of anti-inflammatories for several months, an elevated gastric acid level is a possible etiology.  Surgical options breifly discussed but emergent intervention is not warranted.  Pathology has been reported as normal regarding biopsies of the ulcer bed.  Continue limited PO for now.  No evidence of current active bleeding.  Will check labs and discuss further options with patient and family.  Lanard Arguijo C 06/22/2011, 11:05 PM

## 2011-06-22 NOTE — Progress Notes (Signed)
Subjective: Still having epigastric pain, no po intake, pain worse with po intake, no bowel movement  Objective: Vital signs in last 24 hours: Temp:  [98.3 F (36.8 C)-98.5 F (36.9 C)] 98.5 F (36.9 C) (04/02 0449) Pulse Rate:  [72-78] 78  (04/02 0449) Resp:  [18] 18  (04/02 0449) BP: (99-112)/(65-73) 99/66 mmHg (04/02 0449) SpO2:  [91 %-95 %] 91 % (04/02 0449) Weight change:  Last BM Date: 06/18/11  Intake/Output from previous day: 04/01 0701 - 04/02 0700 In: 3790 [I.V.:3790] Out: 800 [Urine:800]     Physical Exam: General: Alert, awake, oriented x3, in no acute distress. HEENT: No bruits, no goiter. Heart: Regular rate and rhythm, without murmurs, rubs, gallops. Lungs: Clear to auscultation bilaterally. Abdomen: Soft, tender in epigastrium, nondistended, positive bowel sounds. Extremities: No clubbing cyanosis or edema with positive pedal pulses. Neuro: Grossly intact, nonfocal.    Lab Results: Basic Metabolic Panel:  Basename 06/22/11 0616 06/21/11 0438  NA 139 140  K 4.0 3.2*  CL 102 105  CO2 28 28  GLUCOSE 81 90  BUN 3* <3*  CREATININE 0.55 0.46*  CALCIUM 9.2 8.8  MG -- --  PHOS -- --   Liver Function Tests:  [facility] 06/21/11 0438 06/20/11 1529  AST 19 23  ALT 12 16  ALKPHOS 100 117  BILITOT 0.3 0.3  PROT 5.4* 6.4  ALBUMIN 2.9* 3.4*    Basename 06/20/11 1529 06/19/11 2345  LIPASE 16 21  AMYLASE -- --   No results found for this basename: AMMONIA:2 in the last 72 hours CBC:  Basename 06/22/11 0616 06/21/11 0438 06/20/11 1450 06/19/11 2345  WBC 4.5 4.3 -- --  NEUTROABS -- -- 2.2 1.9  HGB 8.9* 8.3* -- --  HCT 27.9* 26.1* -- --  MCV 87.7 87.9 -- --  PLT 326 309 -- --   Cardiac Enzymes: No results found for this basename: CKTOTAL:3,CKMB:3,CKMBINDEX:3,TROPONINI:3 in the last 72 hours BNP: No results found for this basename: PROBNP:3 in the last 72 hours D-Dimer: No results found for this basename: DDIMER:2 in the last 72 hours CBG: No  results found for this basename: GLUCAP:6 in the last 72 hours Hemoglobin A1C: No results found for this basename: HGBA1C in the last 72 hours Fasting Lipid Panel: No results found for this basename: CHOL,HDL,LDLCALC,TRIG,CHOLHDL,LDLDIRECT in the last 72 hours Thyroid Function Tests: No results found for this basename: TSH,T4TOTAL,FREET4,T3FREE,THYROIDAB in the last 72 hours Anemia Panel: No results found for this basename: VITAMINB12,FOLATE,FERRITIN,TIBC,IRON,RETICCTPCT in the last 72 hours Coagulation: No results found for this basename: LABPROT:2,INR:2 in the last 72 hours Urine Drug Screen: Drugs of Abuse  No results found for this basename: labopia, cocainscrnur, labbenz, amphetmu, thcu, labbarb    Alcohol Level: No results found for this basename: ETH:2 in the last 72 hours Urinalysis: No results found for this basename: COLORURINE:2,APPERANCEUR:2,LABSPEC:2,PHURINE:2,GLUCOSEU:2,HGBUR:2,BILIRUBINUR:2,KETONESUR:2,PROTEINUR:2,UROBILINOGEN:2,NITRITE:2,LEUKOCYTESUR:2 in the last 72 hours  No results found for this or any previous visit (from the past 240 hour(s)).  Studies/Results: Dg Abd Acute W/chest  06/20/2011  *RADIOLOGY REPORT*  Clinical Data: Abdominal pain  ACUTE ABDOMEN SERIES (ABDOMEN 2 VIEW & CHEST 1 VIEW)  Comparison: 06/08/2011  Findings: Heart size appears normal.  No pleural effusion or edema.  No airspace consolidation identified.  Cholecystectomy clips are noted within the right upper quadrant of the abdomen.  There is barium within the colon. Presumably,  this is from the upper GI performed 06/08/2011.  No abnormally dilated loops of small bowel identified.  There is no air-fluid levels identified.  No evidence for free air.  IMPRESSION:  1.  No acute cardiopulmonary abnormalities. 2.  Nonobstructive bowel gas pattern.  Original Report Authenticated By: Rosealee Albee, M.D.    Medications: Scheduled Meds:   . adalimumab  40 mg Subcutaneous Q7 days  . amLODipine  5  mg Oral Daily  . LORazepam  0.5 mg Oral BID  . ondansetron  4 mg Intravenous Once  . pantoprazole (PROTONIX) IV  40 mg Intravenous BID AC  . potassium chloride  40 mEq Oral Q3H  . QUEtiapine  100 mg Oral BID  . sodium chloride      . DISCONTD: heparin  5,000 Units Subcutaneous Q8H  . DISCONTD: Linaclotide  1 capsule Oral Daily  . DISCONTD: pantoprazole  40 mg Oral BID AC   Continuous Infusions:   . 0.9 % NaCl with KCl 40 mEq / L 75 mL/hr at 06/21/11 2117   PRN Meds:.HYDROmorphone, nitroGLYCERIN, ondansetron (ZOFRAN) IV, ondansetron  Assessment/Plan:  Active Problems:  Abdominal pain  Rheumatoid arthritis  Duodenal stricture  Anemia  Plan:  Patient will undergo endoscopy today to evaluate for recurrent duodenal stricture.  She will also have TCS done on 4/4. She is npo except meds. Anemia is stable, no indication for transfusion Hypokalemia improved.    LOS: 2 days   Eythan Jayne [facility] Pager: [phone] 06/22/2011, 11:16 AM

## 2011-06-22 NOTE — Op Note (Signed)
[facility] [address] [place], [place]  [address]  ENDOSCOPY PROCEDURE REPORT  PATIENT:  Jordan, Haney  MR#:  [id number] BIRTHDATE:  03/19/1956, 56 yrs. old  GENDER:  female  ENDOSCOPIST:  Jonette Eva, MD Referred by:  DR. Kerry Hough  PROCEDURE DATE:  06/22/2011 PROCEDURE:  EGD, diagnostic 19147 ASA CLASS: INDICATIONS:  PRESENTED WITH ANEMIA ABDOMINAL PAIN AND VOMITING. HB DECREASED FROM 15 TO 7. S/P 1u pRBCS IN [place] AND 2u pRBCs AT [facility]. NO MELENA OR BRBPR. HB STABLE.  PT DENIES USING NSAIDS.  EGD/DILATION OF DUODENAL STRICTURE x 2(JAN 2013, FE 2013) DUE TO NSAIDs  MEDICATIONS:   Demerol 100 mg IV, Versed 5 mg IV TOPICAL ANESTHETIC:  Cetacaine Spray  DESCRIPTION OF PROCEDURE:     Physical exam was performed. Informed consent was obtained from the patient after explaining the benefits, risks, and alternatives to the procedure.  The patient was connected to the monitor and placed in the left lateral position.  Continuous oxygen was provided by nasal cannula and IV medicine administered through an indwelling cannula.  After administration of sedation, the patient's esophagus was intubated and the EG-[id number] ([id number]) endoscope was advanced under direct visualization to the second portion of the duodenum.  The scope was removed slowly by carefully examining the color, texture, anatomy, and integrity of the mucosa on the way out.  The patient was recovered in endoscopy and discharged home in satisfactory condition. <<PROCEDUREIMAGES>>  A PATENT Schatzki's ring was found.  NO BARETT'S. Moderate gastritis was found.  A NON-HEALING CLEAN-BASED ulcer was found AT THE JUNTION OF D1/D2. EDGES ARE FRIABLE AND BLEED EASILY WHEN THE SCOPE PASSES. THE ULCER IS LOCATED AT THE SITE OF THE stricture AT THE JUNCTION OF D1/D2.  COMPLICATIONS:    None  ENDOSCOPIC IMPRESSION: 1) Schatzki's ring 2) Moderate gastritis 3) PERSISTENT Ulcer in the bulb/descending duodenum, DUE  TO NSAIDS, MOST LIKELY SOURCE OF ANEMIA 4) Stricture in the bulb/descending duodenum, NOT AMENABLE TO ENDOSCOPIC THERAPY  RECOMMENDATIONS: BID PPI NPO EXCEPT ICE CHIPS CHECK GASTRIN LEVEL SURGERY CONSULT  REPEAT EXAM:  No  ______________________________ Jonette Eva, MD  CC:  n. eSIGNED:   Natasha Paulson at 06/22/2011 12:57 PM  Roger Kill, [phone]

## 2011-06-22 NOTE — H&P (View-Only) (Signed)
Primary Care Physician: Rosita Kea, DO  Primary Gastroenterologist:  Jonette Eva, MD  Chief Complaint  Patient presents with  . Follow-up  . Abdominal Pain    HPI: Jordan Haney is a 56 y.o. female here for followup her recent hospitalization. 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 Last time was on 05/12/2011.  She also has a history of chronic pancreatic duct dilation on prior imaging studies, intermittent abnormal LFTs. She is scheduled to have endoscopic ultrasound with Dr. Rob Bunting in the near future.  She went home from the hospital on February 28. She states she had been doing well with a soft diet up until yesterday. She ate some rice last night and has been feeling nauseated and having epigastric pain since that time. Currently having epigastric pain radiating into her back. No vomiting yet. Really denies any heartburn. Bowels finally started moving are pretty regular. No melena or rectal bleeding.    . Esophagogastroduodenoscopy 04/06/11    Dr. Darrick Penna: 1-2 cm hiatal hernia, mod gastritis, 75mmX3mm linear ulcer in duodenal bulb, stricture 1st part of duodenum, dilated to 12 mm  . Esophagogastroduodenoscopy 05/12/11    partial gastric oulet obstruction secondary to stricture between bulb/2nd portion of duodenum with marked friability and inflammation but no discrete ulcer, dilated stomach. s/p dilation but high risk for restenosis    Current Outpatient Prescriptions  Medication Sig Dispense Refill  . adalimumab (HUMIRA) 40 MG/0.8ML injection Inject 40 mg into the skin every 7 (seven) days.       Marland Kitchen amLODipine (NORVASC) 5 MG tablet Take 1 tablet (5 mg total) by mouth daily.  30 tablet  0  . Aspirin-Salicylamide-Caffeine (BC HEADACHE POWDER PO) Take 1-2 packets by mouth as needed. For pain      . esomeprazole (NEXIUM) 40 MG capsule Take 40 mg by mouth daily before breakfast.        . HYDROcodone-acetaminophen (LORTAB 7.5) 7.5-500 MG  per tablet Take 1 tablet by mouth every 6 (six) hours as needed. For pain  30 tablet  0  . Linaclotide (LINZESS) 145 MCG CAPS Take 1 capsule by mouth daily. 1 PO DAILY  31 capsule  11  . LORazepam (ATIVAN) 0.5 MG tablet Take 0.5 mg by mouth 2 (two) times daily.      . methotrexate (RHEUMATREX) 2.5 MG tablet Take 12.5 mg by mouth once a week. Caution:Chemotherapy. Protect from light.      . nitroGLYCERIN (NITROSTAT) 0.4 MG SL tablet Place 0.4 mg under the tongue every 5 (five) minutes as needed. Chest pain      . polyethylene glycol (MIRALAX / GLYCOLAX) packet Take 17 g by mouth 2 (two) times daily.  14 each    . QUEtiapine (SEROQUEL) 100 MG tablet Take 100 mg by mouth 2 (two) times daily.      Marland Kitchen senna (SENOKOT) 8.6 MG tablet Take 1 tablet (8.6 mg total) by mouth daily. FOR CONSTIPATION.        Allergies as of 06/02/2011 - Review Complete 06/02/2011  Allergen Reaction Noted  . Compazine Shortness Of Breath 03/20/2011  . Phenergan Shortness Of Breath 03/20/2011  . Vistaril (hyzine) Shortness Of Breath 03/20/2011    ROS:  General: Negative for anorexia, weight loss, fever, chills, fatigue, weakness. ENT: Negative for hoarseness, difficulty swallowing , nasal congestion. CV: Negative for chest pain, angina, palpitations, dyspnea on exertion, peripheral edema.  Respiratory: Negative for dyspnea at rest, dyspnea on exertion, cough, sputum, wheezing.  GI:  See history of present illness. GU:  Negative for dysuria, hematuria, urinary incontinence, urinary frequency, nocturnal urination.  Endo: Negative for unusual weight change. Weight has been stable over the last 2 months.   Physical Examination:   BP 133/93  Pulse 94  Temp(Src) 97.8 F (36.6 C) (Temporal)  Ht 5\' 4"  (1.626 m)  Wt 142 lb 12.8 oz (64.774 kg)  BMI 24.51 kg/m2  General: Well-nourished, well-developed in no acute distress. Well groomed today. Eyes: No icterus. Mouth: Oropharyngeal mucosa moist and pink , no lesions  erythema or exudate. Lungs: Clear to auscultation bilaterally.  Heart: Regular rate and rhythm, no murmurs rubs or gallops.  Abdomen: Bowel sounds are normal, nondistended. Mild to moderate epigastric tenderness to deep palpation.   Extremities: No lower extremity edema. No clubbing or deformities. Neuro: Alert and oriented x 4   Skin: Warm and dry, no jaundice.   Psych: Alert and cooperative, normal mood and affect.  Labs:  Lab Results  Component Value Date   WBC 4.5 05/20/2011   HGB 10.4* 05/20/2011   HCT 32.6* 05/20/2011   MCV 88.1 05/20/2011   PLT 319 05/20/2011   Lab Results  Component Value Date   CREATININE 0.71 05/20/2011   BUN 5* 05/20/2011   NA 140 05/20/2011   K 4.4 05/20/2011   CL 102 05/20/2011   CO2 31 05/20/2011   Lab Results  Component Value Date   ALT 14 05/20/2011   AST 22 05/20/2011   ALKPHOS 107 05/20/2011   BILITOT 0.2* 05/20/2011   Lab Results  Component Value Date   LIPASE 16 05/11/2011

## 2011-06-22 NOTE — Interval H&P Note (Signed)
History and Physical Interval Note:  06/22/2011 11:56 AM  Jordan Haney  has presented today for surgery, with the diagnosis of DUODENAL STRICTURE, S/P DILATION JAN AND FEB 2013  The various methods of treatment have been discussed with the patient and family. After consideration of risks, benefits and other options for treatment, the patient has consented to  Procedure(s) (LRB): ESOPHAGOGASTRODUODENOSCOPY (EGD) (N/A) as a surgical intervention .  The patients' history has been reviewed, patient examined, no change in status, stable for surgery.  I have reviewed the patients' chart and labs.  Questions were answered to the patient's satisfaction.     [employer]

## 2011-06-23 DIAGNOSIS — K315 Obstruction of duodenum: Secondary | ICD-10-CM

## 2011-06-23 LAB — COMPREHENSIVE METABOLIC PANEL
ALT: 9 U/L (ref 0–35)
CO2: 30 mEq/L (ref 19–32)
Calcium: 9.3 mg/dL (ref 8.4–10.5)
Chloride: 99 mEq/L (ref 96–112)
Creatinine, Ser: 0.54 mg/dL (ref 0.50–1.10)
GFR calc Af Amer: >90 mL/min (ref >90)
GFR calc non Af Amer: >90 mL/min (ref >90)
Glucose, Bld: 80 mg/dL (ref 70–99)
Total Bilirubin: 0.3 mg/dL (ref 0.3–1.2)

## 2011-06-23 LAB — CBC
Hemoglobin: 8.9 g/dL — ABNORMAL LOW (ref 12.0–15.0)
MCH: 27.8 pg (ref 26.0–34.0)
MCV: 86.3 fL (ref 78.0–100.0)
RBC: 3.2 MIL/uL — ABNORMAL LOW (ref 3.87–5.11)

## 2011-06-23 MED ORDER — ENSURE COMPLETE PO LIQD
237.0000 mL | Freq: Three times a day (TID) | ORAL | Status: DC
  Administered 2011-06-23 – 2011-06-27 (×12): 237 mL via ORAL

## 2011-06-23 MED ORDER — ONDANSETRON HCL 4 MG/2ML IJ SOLN
4.0000 mg | Freq: Three times a day (TID) | INTRAMUSCULAR | Status: DC
  Administered 2011-06-24 – 2011-07-16 (×60): 4 mg via INTRAVENOUS
  Filled 2011-06-23 (×51): qty 2

## 2011-06-23 NOTE — Progress Notes (Signed)
REVIEWED.  TRY FULL LIQUID DIET. ENSURE TID. MONITOR HB.

## 2011-06-23 NOTE — Progress Notes (Signed)
Subjective: Patient feels the same today, still has epigastric pain, nausea but no vomiting, no po intake, no bowel movments  Objective: Vital signs in last 24 hours: Temp:  [97.9 F (36.6 C)-98.3 F (36.8 C)] 97.9 F (36.6 C) (04/03 0858) Pulse Rate:  [80-101] 80  (04/03 0858) Resp:  [13-20] 18  (04/03 0858) BP: (89-123)/(58-101) 101/68 mmHg (04/03 0858) SpO2:  [90 %-100 %] 95 % (04/03 0858) Weight change:  Last BM Date: 06/21/11  Intake/Output from previous day: 04/02 0701 - 04/03 0700 In: 842.5 [I.V.:842.5] Out: 750 [Urine:750]     Physical Exam: General: Alert, awake, oriented x3, in no acute distress.  HEENT: No bruits, no goiter.  Heart: Regular rate and rhythm, without murmurs, rubs, gallops.  Lungs: Clear to auscultation bilaterally.  Abdomen: Soft, tender in epigastrium, nondistended, positive bowel sounds.  Extremities: No clubbing cyanosis or edema with positive pedal pulses.  Neuro: Grossly intact, nonfocal.   Lab Results: Basic Metabolic Panel:  Basename 06/23/11 0953 06/22/11 0616  NA 137 139  K 4.2 4.0  CL 99 102  CO2 30 28  GLUCOSE 80 81  BUN 3* 3*  CREATININE 0.54 0.55  CALCIUM 9.3 9.2  MG -- --  PHOS -- --   Liver Function Tests:  [facility] 06/23/11 0953 06/21/11 0438  AST 18 19  ALT 9 12  ALKPHOS 112 100  BILITOT 0.3 0.3  PROT 5.6* 5.4*  ALBUMIN 2.9* 2.9*    Basename 06/20/11 1529  LIPASE 16  AMYLASE --   No results found for this basename: AMMONIA:2 in the last 72 hours CBC:  Basename 06/23/11 0953 06/22/11 0616 06/20/11 1450  WBC 4.4 4.5 --  NEUTROABS -- -- 2.2  HGB 8.9* 8.9* --  HCT 27.6* 27.9* --  MCV 86.3 87.7 --  PLT 359 326 --   Cardiac Enzymes: No results found for this basename: CKTOTAL:3,CKMB:3,CKMBINDEX:3,TROPONINI:3 in the last 72 hours BNP: No results found for this basename: PROBNP:3 in the last 72 hours D-Dimer: No results found for this basename: DDIMER:2 in the last 72 hours CBG: No results found for  this basename: GLUCAP:6 in the last 72 hours Hemoglobin A1C: No results found for this basename: HGBA1C in the last 72 hours Fasting Lipid Panel: No results found for this basename: CHOL,HDL,LDLCALC,TRIG,CHOLHDL,LDLDIRECT in the last 72 hours Thyroid Function Tests: No results found for this basename: TSH,T4TOTAL,FREET4,T3FREE,THYROIDAB in the last 72 hours Anemia Panel: No results found for this basename: VITAMINB12,FOLATE,FERRITIN,TIBC,IRON,RETICCTPCT in the last 72 hours Coagulation: No results found for this basename: LABPROT:2,INR:2 in the last 72 hours Urine Drug Screen: Drugs of Abuse  No results found for this basename: labopia, cocainscrnur, labbenz, amphetmu, thcu, labbarb    Alcohol Level: No results found for this basename: ETH:2 in the last 72 hours Urinalysis: No results found for this basename: COLORURINE:2,APPERANCEUR:2,LABSPEC:2,PHURINE:2,GLUCOSEU:2,HGBUR:2,BILIRUBINUR:2,KETONESUR:2,PROTEINUR:2,UROBILINOGEN:2,NITRITE:2,LEUKOCYTESUR:2 in the last 72 hours  No results found for this or any previous visit (from the past 240 hour(s)).  Studies/Results: No results found.  Medications: Scheduled Meds:   . adalimumab  40 mg Subcutaneous Q7 days  . amLODipine  5 mg Oral Daily  . LORazepam  0.5 mg Oral BID  . meperidine      . midazolam      . ondansetron  4 mg Intravenous Once  . pantoprazole (PROTONIX) IV  40 mg Intravenous BID AC  . QUEtiapine  100 mg Oral BID  . sodium chloride       Continuous Infusions:   . 0.9 % NaCl with KCl 40  mEq / L 75 mL/hr at 06/23/11 0814  . DISCONTD: sodium chloride 50 mL/hr at 06/22/11 1132   PRN Meds:.HYDROmorphone, nitroGLYCERIN, ondansetron (ZOFRAN) IV, ondansetron, DISCONTD: butamben-tetracaine-benzocaine, DISCONTD: meperidine, DISCONTD: midazolam, DISCONTD: simethicone susp in sterile water 1000 mL irrigation  Assessment/Plan:  Active Problems:  Abdominal pain  Rheumatoid arthritis  Duodenal stricture   Anemia  Plan:  Abdominal pain with duodenal ulcer and stricture.  Endoscopy note noted.  Gastrin level is in process.  Surgery has been consulted to evaluate for further treatment options.  Continue BID PPI Defer need for colonoscopy to GI  Anemia is stable.   LOS: 3 days   Suprina Mandeville [facility] Pager: [phone] 06/23/2011, 11:39 AM

## 2011-06-23 NOTE — Progress Notes (Signed)
1 Day Post-Op  Subjective: Patient seen earlier.  No significant change.  Still with epigastric pain.  No sig change from yesterday.  No nausea.  No change in BM.  Objective: Vital signs in last 24 hours: Temp:  [97.9 F (36.6 C)-99.3 F (37.4 C)] 99.3 F (37.4 C) (04/03 1300) Pulse Rate:  [80-85] 85  (04/03 1300) Resp:  [16-20] 20  (04/03 1300) BP: (94-101)/(60-68) 94/60 mmHg (04/03 1300) SpO2:  [94 %-95 %] 95 % (04/03 1300) Last BM Date: 06/21/11  Intake/Output from previous day: 04/02 0701 - 04/03 0700 In: 842.5 [I.V.:842.5] Out: 750 [Urine:750] Intake/Output this shift:    General appearance: alert and no distress GI: +BS, soft moderate epigastric and RUQ pain.  No peritoneal signs.  Lab Results:   Basename 06/23/11 0953 06/22/11 0616  WBC 4.4 4.5  HGB 8.9* 8.9*  HCT 27.6* 27.9*  PLT 359 326   BMET  Basename 06/23/11 0953 06/22/11 0616  NA 137 139  K 4.2 4.0  CL 99 102  CO2 30 28  GLUCOSE 80 81  BUN 3* 3*  CREATININE 0.54 0.55  CALCIUM 9.3 9.2   PT/INR No results found for this basename: LABPROT:2,INR:2 in the last 72 hours ABG No results found for this basename: PHART:2,PCO2:2,PO2:2,HCO3:2 in the last 72 hours  Studies/Results: No results found.  Anti-infectives: Anti-infectives    None      Assessment/Plan: s/p Procedure(s) (LRB): ESOPHAGOGASTRODUODENOSCOPY (EGD) (N/A) Chronic duodenal ulcer.  Clears as tolerated.  Awaiting gastrin levels.  Surgical direction based on results.  Patient aware.  LOS: 3 days    Katee Wentland C 06/23/2011

## 2011-06-23 NOTE — Progress Notes (Signed)
Subjective: + epigastric pain. No N/V. Denies constipation. EGD 4/2 with Schatzki's ring, moderate gastritis, persistent ulcer in bulb/descending duodenum, likely source of anemia. Duodenal stricture, not amenable to endoscopic therapy. Dr. Illene Regulus input appreciated, gastrin level in process.   Objective: Vital signs in last 24 hours: Temp:  [98 F (36.7 C)-98.3 F (36.8 C)] 98 F (36.7 C) (04/03 0631) Pulse Rate:  [80-101] 80  (04/03 0631) Resp:  [13-20] 16  (04/03 0631) BP: (89-123)/(58-101) 100/65 mmHg (04/03 0631) SpO2:  [90 %-100 %] 94 % (04/03 0631) Weight:  [142 lb (64.411 kg)] 142 lb (64.411 kg) (04/02 1128) Last BM Date: 06/21/11 General:   Flat affect, resting with eyes closed.  Head:  Normocephalic and atraumatic. Eyes:  No icterus, sclera clear. Conjuctiva pink.  Heart:  S1, S2 present, no murmurs noted.  Lungs: Clear to auscultation bilaterally, without wheezing, rales, or rhonchi.  Abdomen:  Bowel sounds present, soft, TTP epigastric region, non-distended. No HSM or hernias noted. No rebound or guarding. No masses appreciated  Msk:  Symmetrical without gross deformities. Normal posture. Extremities:  Without clubbing or edema. Neurologic:  Alert and  oriented x4;  grossly normal neurologically. Skin:  Warm and dry, intact without significant lesions.   Intake/Output from previous day: 04/02 0701 - 04/03 0700 In: 842.5 [I.V.:842.5] Out: 750 [Urine:750] Intake/Output this shift:    Lab Results:  Basename 06/22/11 0616 06/21/11 0438 06/20/11 1450  WBC 4.5 4.3 4.4  HGB 8.9* 8.3* 9.1*  HCT 27.9* 26.1* 27.9*  PLT 326 309 295   BMET  Basename 06/22/11 0616 06/21/11 0438 06/20/11 1450  NA 139 140 140  K 4.0 3.2* 3.0*  CL 102 105 99  CO2 28 28 29   GLUCOSE 81 90 85  BUN 3* <3* 4*  CREATININE 0.55 0.46* 0.54  CALCIUM 9.2 8.8 9.6   LFT  Basename 06/21/11 0438 06/20/11 1529  PROT 5.4* 6.4  ALBUMIN 2.9* 3.4*  AST 19 23  ALT 12 16  ALKPHOS 100 117    BILITOT 0.3 0.3  BILIDIR -- <0.1  IBILI -- NOT CALCULATED    Assessment: 56 year old female with chronic duodenal ulcer and stricture. Anemia stable. Surgery consult appreciated, gastrin level pending. Pt with continued pain, no N/V. Continue strict NPO for now, eventually will need outpatient TCS but not indicated at this time due to current presentation.    Plan: Supportive measures Follow-up on gastrin level Await further recommendations from surgery Remain NPO  LOS: 3 days   Gerrit Halls  06/23/2011, 8:18 AM

## 2011-06-24 DIAGNOSIS — K269 Duodenal ulcer, unspecified as acute or chronic, without hemorrhage or perforation: Secondary | ICD-10-CM

## 2011-06-24 DIAGNOSIS — D649 Anemia, unspecified: Secondary | ICD-10-CM

## 2011-06-24 LAB — GASTRIN: Gastrin: 66 pg/mL (ref <101)

## 2011-06-24 MED ORDER — SODIUM CHLORIDE 0.9 % IJ SOLN
INTRAMUSCULAR | Status: AC
  Administered 2011-06-24: 10 mL
  Filled 2011-06-24: qty 3

## 2011-06-24 NOTE — Progress Notes (Signed)
2 Days Post-Op  Subjective: No significant change.  No fever or chills.    Objective: Vital signs in last 24 hours: Temp:  [98.3 F (36.8 C)-99.3 F (37.4 C)] 98.3 F (36.8 C) (04/04 1400) Pulse Rate:  [74-94] 88  (04/04 1400) Resp:  [16-20] 16  (04/04 1400) BP: (95-121)/(56-76) 121/76 mmHg (04/04 1400) SpO2:  [92 %-96 %] 92 % (04/04 1400) Last BM Date: 06/23/11  Intake/Output from previous day: 04/03 0701 - 04/04 0700 In: 3279.5 [P.O.:360; I.V.:2897.5; IV Piggyback:22] Out: 1800 [Urine:1800] Intake/Output this shift:    General appearance: alert and no distress GI: +BS, soft, moderate tenderness.  no peritoneal signs.  Lab Results:   Basename 06/23/11 0953 06/22/11 0616  WBC 4.4 4.5  HGB 8.9* 8.9*  HCT 27.6* 27.9*  PLT 359 326   BMET  Basename 06/23/11 0953 06/22/11 0616  NA 137 139  K 4.2 4.0  CL 99 102  CO2 30 28  GLUCOSE 80 81  BUN 3* 3*  CREATININE 0.54 0.55  CALCIUM 9.3 9.2   PT/INR No results found for this basename: LABPROT:2,INR:2 in the last 72 hours ABG No results found for this basename: PHART:2,PCO2:2,PO2:2,HCO3:2 in the last 72 hours  Studies/Results: No results found.  Anti-infectives: Anti-infectives    None      Assessment/Plan: s/p Procedure(s) (LRB): ESOPHAGOGASTRODUODENOSCOPY (EGD) (N/A) Chronic duodenal ulcer.  Gastrin normal.  Surgical options discussed.  Patient's cardiac history discussed. While low risk of cardiac disease, cardiology consult will be obtained for cardiac clearance.  Will plan to procede with open antrectory, duodenectomy with roux-en-y gastro-jejunostomy early next week pending cardiac work-up.  LOS: 4 days    Asmaa Tirpak C 06/24/2011

## 2011-06-24 NOTE — Progress Notes (Signed)
Subjective: abd pain worse after eating, nausea but no vomiting, no bowel movement  Objective: Vital signs in last 24 hours: Temp:  [98.6 F (37 C)-99.3 F (37.4 C)] 98.6 F (37 C) (04/04 0536) Pulse Rate:  [74-94] 74  (04/04 0842) Resp:  [20] 20  (04/04 0536) BP: (95-104)/(56-67) 96/61 mmHg (04/04 0842) SpO2:  [93 %-96 %] 96 % (04/04 0536) Weight change:  Last BM Date: 06/23/11  Intake/Output from previous day: 04/03 0701 - 04/04 0700 In: 3279.5 [P.O.:360; I.V.:2897.5; IV Piggyback:22] Out: 1800 [Urine:1800]     Physical Exam: General: Alert, awake, oriented x3, in no acute distress. HEENT: No bruits, no goiter. Heart: Regular rate and rhythm, without murmurs, rubs, gallops. Lungs: Clear to auscultation bilaterally. Abdomen: Soft, tender in epigastrium, nondistended, positive bowel sounds. Extremities: No clubbing cyanosis or edema with positive pedal pulses. Neuro: Grossly intact, nonfocal.    Lab Results: Basic Metabolic Panel:  Basename 06/23/11 0953 06/22/11 0616  NA 137 139  K 4.2 4.0  CL 99 102  CO2 30 28  GLUCOSE 80 81  BUN 3* 3*  CREATININE 0.54 0.55  CALCIUM 9.3 9.2  MG -- --  PHOS -- --   Liver Function Tests:  Basename 06/23/11 0953  AST 18  ALT 9  ALKPHOS 112  BILITOT 0.3  PROT 5.6*  ALBUMIN 2.9*   No results found for this basename: LIPASE:2,AMYLASE:2 in the last 72 hours No results found for this basename: AMMONIA:2 in the last 72 hours CBC:  Basename 06/23/11 0953 06/22/11 0616  WBC 4.4 4.5  NEUTROABS -- --  HGB 8.9* 8.9*  HCT 27.6* 27.9*  MCV 86.3 87.7  PLT 359 326   Cardiac Enzymes: No results found for this basename: CKTOTAL:3,CKMB:3,CKMBINDEX:3,TROPONINI:3 in the last 72 hours BNP: No results found for this basename: PROBNP:3 in the last 72 hours D-Dimer: No results found for this basename: DDIMER:2 in the last 72 hours CBG: No results found for this basename: GLUCAP:6 in the last 72 hours Hemoglobin A1C: No results  found for this basename: HGBA1C in the last 72 hours Fasting Lipid Panel: No results found for this basename: CHOL,HDL,LDLCALC,TRIG,CHOLHDL,LDLDIRECT in the last 72 hours Thyroid Function Tests: No results found for this basename: TSH,T4TOTAL,FREET4,T3FREE,THYROIDAB in the last 72 hours Anemia Panel: No results found for this basename: VITAMINB12,FOLATE,FERRITIN,TIBC,IRON,RETICCTPCT in the last 72 hours Coagulation: No results found for this basename: LABPROT:2,INR:2 in the last 72 hours Urine Drug Screen: Drugs of Abuse  No results found for this basename: labopia, cocainscrnur, labbenz, amphetmu, thcu, labbarb    Alcohol Level: No results found for this basename: ETH:2 in the last 72 hours Urinalysis: No results found for this basename: COLORURINE:2,APPERANCEUR:2,LABSPEC:2,PHURINE:2,GLUCOSEU:2,HGBUR:2,BILIRUBINUR:2,KETONESUR:2,PROTEINUR:2,UROBILINOGEN:2,NITRITE:2,LEUKOCYTESUR:2 in the last 72 hours  No results found for this or any previous visit (from the past 240 hour(s)).  Studies/Results: No results found.  Medications: Scheduled Meds:   . adalimumab  40 mg Subcutaneous Q7 days  . amLODipine  5 mg Oral Daily  . feeding supplement  237 mL Oral TID BM  . LORazepam  0.5 mg Oral BID  . ondansetron  4 mg Intravenous Once  . ondansetron (ZOFRAN) IV  4 mg Intravenous TID AC  . pantoprazole (PROTONIX) IV  40 mg Intravenous BID AC  . QUEtiapine  100 mg Oral BID   Continuous Infusions:   . 0.9 % NaCl with KCl 40 mEq / L 75 mL/hr at 06/24/11 1446   PRN Meds:.HYDROmorphone, nitroGLYCERIN, ondansetron (ZOFRAN) IV, ondansetron  Assessment/Plan:  Active Problems:  Abdominal pain  Rheumatoid arthritis  Duodenal stricture  Anemia  Plan:  Gastrin level normal, await further recommendations from surgery Continue PPI Continue liquid diet for now Hemoglobin stable   LOS: 4 days   Milam Allbaugh [facility] Pager: [phone] 06/24/2011, 3:29 PM

## 2011-06-24 NOTE — Progress Notes (Addendum)
REVIEWED. Gastrin 66 awaiting surgery recommendations.  SUPPORTIVE CARE.

## 2011-06-24 NOTE — Progress Notes (Signed)
Subjective:  C/O pp abdominal pain with only few bites. No melena, brbpr. Had EGD while in [facility] late 05/2011 for bleeding ulcer.   Objective: Vital signs in last 24 hours: Temp:  [98.6 F (37 C)-99.3 F (37.4 C)] 98.6 F (37 C) (04/04 0536) Pulse Rate:  [74-94] 74  (04/04 0842) Resp:  [20] 20  (04/04 0536) BP: (94-104)/(56-67) 96/61 mmHg (04/04 0842) SpO2:  [93 %-96 %] 96 % (04/04 0536) Last BM Date: 06/23/11 General:   Alert,  Well-developed, well-nourished, pleasant and cooperative in NAD Head:  Normocephalic and atraumatic. Eyes:  Sclera clear, no icterus.   Abdomen:  Soft, epigastric/ruq tenderness, and nondistended. No masses, hepatosplenomegaly or hernias noted. Normal bowel sounds, without guarding, and without rebound.   Extremities:  Without clubbing, deformity or edema. Neurologic:  Alert and  oriented x4;  grossly normal neurologically. Skin:  Intact without significant lesions or rashes. Psych:  Alert and cooperative. Normal mood and affect.  Intake/Output from previous day: 04/03 0701 - 04/04 0700 In: 3279.5 [P.O.:360; I.V.:2897.5; IV Piggyback:22] Out: 1800 [Urine:1800] Intake/Output this shift:    Lab Results: CBC  Basename 06/23/11 0953 06/22/11 0616  WBC 4.4 4.5  HGB 8.9* 8.9*  HCT 27.6* 27.9*  MCV 86.3 87.7  PLT 359 326   BMET  Basename 06/23/11 0953 06/22/11 0616  NA 137 139  K 4.2 4.0  CL 99 102  CO2 30 28  GLUCOSE 80 81  BUN 3* 3*  CREATININE 0.54 0.55  CALCIUM 9.3 9.2   LFTs  Basename 06/23/11 0953  BILITOT 0.3  BILIDIR --  IBILI --  ALKPHOS 112  AST 18  ALT 9  PROT 5.6*  ALBUMIN 2.9*     Assessment: 56 y/o female with chronic duodenal ulcer with stricture. Anemia stable. Recent hemorrhage requiring emergent EGD at [facility] as well. Patient has not shown any long-term clinical improvement since diagnosis of duodenal ulcer with stricture made in 03/2011. No longer on NSAIDS. Has been on PPI therapy. Suspect  surgical intervention will be necessary.  Plan: 1. Outpatient colonoscopy at later date. 2. Outpatient EUS as scheduled for dilated pancreatic duct. 3. F/u gastrin level.   LOS: 4 days   Tana Coast  06/24/2011, 10:19 AM

## 2011-06-25 ENCOUNTER — Telehealth: Payer: Self-pay | Admitting: Gastroenterology

## 2011-06-25 ENCOUNTER — Encounter (HOSPITAL_COMMUNITY): Payer: Self-pay | Admitting: Gastroenterology

## 2011-06-25 ENCOUNTER — Other Ambulatory Visit: Payer: Self-pay

## 2011-06-25 DIAGNOSIS — K315 Obstruction of duodenum: Secondary | ICD-10-CM

## 2011-06-25 DIAGNOSIS — I517 Cardiomegaly: Secondary | ICD-10-CM

## 2011-06-25 DIAGNOSIS — M069 Rheumatoid arthritis, unspecified: Secondary | ICD-10-CM

## 2011-06-25 DIAGNOSIS — K279 Peptic ulcer, site unspecified, unspecified as acute or chronic, without hemorrhage or perforation: Secondary | ICD-10-CM

## 2011-06-25 DIAGNOSIS — Z01818 Encounter for other preprocedural examination: Secondary | ICD-10-CM

## 2011-06-25 MED ORDER — METOPROLOL TARTRATE 25 MG PO TABS
12.5000 mg | ORAL_TABLET | Freq: Two times a day (BID) | ORAL | Status: DC
  Administered 2011-06-25 – 2011-06-27 (×4): 12.5 mg via ORAL
  Filled 2011-06-25 (×2): qty 1
  Filled 2011-06-25: qty 2
  Filled 2011-06-25 (×2): qty 1

## 2011-06-25 MED ORDER — SODIUM CHLORIDE 0.9 % IJ SOLN
INTRAMUSCULAR | Status: AC
  Administered 2011-06-25: 09:00:00
  Filled 2011-06-25: qty 3

## 2011-06-25 MED ORDER — SODIUM CHLORIDE 0.9 % IJ SOLN
INTRAMUSCULAR | Status: AC
  Administered 2011-06-25: 10 mL
  Filled 2011-06-25: qty 3

## 2011-06-25 MED ORDER — ENSURE COMPLETE PO LIQD
237.0000 mL | Freq: Four times a day (QID) | ORAL | Status: DC
  Administered 2011-06-25 – 2011-06-27 (×8): 237 mL via ORAL

## 2011-06-25 NOTE — Progress Notes (Signed)
See cardiology consultation note from earlier today. ECG is reviewed and normal. Echocardiogram shows preserved LVEF without wall motion abnormality. No records yet received. We initiated low-dose beta blocker therapy. Doubt that further cardiac evaluation will be necessary unless her clinical status changes, or her records elucidate other significant abnormalities that should be pursued further.  Jonelle Sidle, M.D., F.A.C.C.

## 2011-06-25 NOTE — Progress Notes (Signed)
CARE MANAGEMENT NOTE 06/25/2011  Patient:  Jordan Haney, Jordan Haney   Account Number:  [id number]  Date Initiated:  06/21/2011  Documentation initiated by:  Rosemary Holms  Subjective/Objective Assessment:   Pt admitted with abdominal pain. PTA independent with ADL.     Action/Plan:   Spoke to pt at bedside. No HH needs identified at this time.   Anticipated DC Date:  06/30/2011   Anticipated DC Plan:  HOME W HOME HEALTH SERVICES      DC Planning Services  CM consult      Choice offered to / List presented to:             Status of service:  In process, will continue to follow Medicare Important Message given?   (If response is "NO", the following Medicare IM given date fields will be blank) Date Medicare IM given:   Date Additional Medicare IM given:    Discharge Disposition:    Per UR Regulation:    If discussed at Long Length of Stay Meetings, dates discussed:    Comments:  50 1000 Halena Mohar RN BSN CM Pt appears to be proceeded to surgery on monday. CM will follow postoperatively for [facility] DME needs  06/21/11 1000 Everlene Cunning Leanord Hawking RN BSN

## 2011-06-25 NOTE — Progress Notes (Signed)
*  PRELIMINARY RESULTS* Echocardiogram 2D Echocardiogram has been performed.  Conrad Wolsey 06/25/2011, 2:38 PM

## 2011-06-25 NOTE — Progress Notes (Signed)
REVIEWED.  

## 2011-06-25 NOTE — Progress Notes (Signed)
Subjective: Nausea, but no vomiting, has epigastric pain with any po intake  Objective: Vital signs in last 24 hours: Temp:  [98 F (36.7 C)-98.6 F (37 C)] 98.3 F (36.8 C) (04/05 1305) Pulse Rate:  [83-96] 96  (04/05 1305) Resp:  [18-20] 18  (04/05 1305) BP: (93-98)/(66-67) 98/66 mmHg (04/05 1305) SpO2:  [89 %-92 %] 89 % (04/05 1305) Weight change:  Last BM Date: 06/23/11  Intake/Output from previous day: 04/04 0701 - 04/05 0700 In: 2016 [P.O.:1080; I.V.:920; IV Piggyback:16] Out: 650 [Urine:650] Total I/O In: 500 [P.O.:500] Out: -    Physical Exam: General: Alert, awake, oriented x3, in no acute distress. HEENT: No bruits, no goiter. Heart: Regular rate and rhythm, without murmurs, rubs, gallops. Lungs: Clear to auscultation bilaterally. Abdomen: Soft, epigastric tenderness, nondistended, positive bowel sounds. Extremities: No clubbing cyanosis or edema with positive pedal pulses. Neuro: Grossly intact, nonfocal.    Lab Results: Basic Metabolic Panel:  Basename 06/23/11 0953  NA 137  K 4.2  CL 99  CO2 30  GLUCOSE 80  BUN 3*  CREATININE 0.54  CALCIUM 9.3  MG --  PHOS --   Liver Function Tests:  Basename 06/23/11 0953  AST 18  ALT 9  ALKPHOS 112  BILITOT 0.3  PROT 5.6*  ALBUMIN 2.9*   No results found for this basename: LIPASE:2,AMYLASE:2 in the last 72 hours No results found for this basename: AMMONIA:2 in the last 72 hours CBC:  Basename 06/23/11 0953  WBC 4.4  NEUTROABS --  HGB 8.9*  HCT 27.6*  MCV 86.3  PLT 359   Cardiac Enzymes: No results found for this basename: CKTOTAL:3,CKMB:3,CKMBINDEX:3,TROPONINI:3 in the last 72 hours BNP: No results found for this basename: PROBNP:3 in the last 72 hours D-Dimer: No results found for this basename: DDIMER:2 in the last 72 hours CBG: No results found for this basename: GLUCAP:6 in the last 72 hours Hemoglobin A1C: No results found for this basename: HGBA1C in the last 72 hours Fasting  Lipid Panel: No results found for this basename: CHOL,HDL,LDLCALC,TRIG,CHOLHDL,LDLDIRECT in the last 72 hours Thyroid Function Tests: No results found for this basename: TSH,T4TOTAL,FREET4,T3FREE,THYROIDAB in the last 72 hours Anemia Panel: No results found for this basename: VITAMINB12,FOLATE,FERRITIN,TIBC,IRON,RETICCTPCT in the last 72 hours Coagulation: No results found for this basename: LABPROT:2,INR:2 in the last 72 hours Urine Drug Screen: Drugs of Abuse  No results found for this basename: labopia, cocainscrnur, labbenz, amphetmu, thcu, labbarb    Alcohol Level: No results found for this basename: ETH:2 in the last 72 hours Urinalysis: No results found for this basename: COLORURINE:2,APPERANCEUR:2,LABSPEC:2,PHURINE:2,GLUCOSEU:2,HGBUR:2,BILIRUBINUR:2,KETONESUR:2,PROTEINUR:2,UROBILINOGEN:2,NITRITE:2,LEUKOCYTESUR:2 in the last 72 hours  No results found for this or any previous visit (from the past 240 hour(s)).  Studies/Results: No results found.  Medications: Scheduled Meds:   . adalimumab  40 mg Subcutaneous Q7 days  . amLODipine  5 mg Oral Daily  . feeding supplement  237 mL Oral TID BM  . feeding supplement  237 mL Oral QID  . LORazepam  0.5 mg Oral BID  . metoprolol tartrate  12.5 mg Oral BID  . ondansetron  4 mg Intravenous Once  . ondansetron (ZOFRAN) IV  4 mg Intravenous TID AC  . pantoprazole (PROTONIX) IV  40 mg Intravenous BID AC  . QUEtiapine  100 mg Oral BID  . sodium chloride      . sodium chloride       Continuous Infusions:   . 0.9 % NaCl with KCl 40 mEq / L 75 mL/hr at 06/24/11 1446  PRN Meds:.HYDROmorphone, nitroGLYCERIN, ondansetron (ZOFRAN) IV, ondansetron  Assessment/Plan:  Active Problems:  Abdominal pain  Rheumatoid arthritis  Duodenal stricture  Anemia  Preoperative evaluation to rule out surgical contraindication  Chronic duodenal ulcer with abdominal pain for surgical management.  Cardiology following for clearance.  Echo has been  ordered.  Plan for surgery on Monday.   LOS: 5 days   Kenleigh Toback [facility] Pager: [phone] 06/25/2011, 3:09 PM

## 2011-06-25 NOTE — Progress Notes (Signed)
3 Days Post-Op  Subjective: No acute change.  Abdominal pain about the same.   Objective: Vital signs in last 24 hours: Temp:  [98 F (36.7 C)-98.6 F (37 C)] 98.3 F (36.8 C) (04/05 1305) Pulse Rate:  [83-96] 96  (04/05 1305) Resp:  [18-20] 18  (04/05 1305) BP: (93-98)/(66-67) 98/66 mmHg (04/05 1305) SpO2:  [89 %-92 %] 89 % (04/05 1305) Last BM Date: 06/23/11  Intake/Output from previous day: 04/04 0701 - 04/05 0700 In: 2016 [P.O.:1080; I.V.:920; IV Piggyback:16] Out: 650 [Urine:650] Intake/Output this shift: Total I/O In: 500 [P.O.:500] Out: -   General appearance: alert and no distress GI: +BS, soft, moderate tenderness.  Lab Results:   [facility] 06/23/11 0953  WBC 4.4  HGB 8.9*  HCT 27.6*  PLT 359   BMET  Basename 06/23/11 0953  NA 137  K 4.2  CL 99  CO2 30  GLUCOSE 80  BUN 3*  CREATININE 0.54  CALCIUM 9.3   PT/INR No results found for this basename: LABPROT:2,INR:2 in the last 72 hours ABG No results found for this basename: PHART:2,PCO2:2,PO2:2,HCO3:2 in the last 72 hours  Studies/Results: No results found.  Anti-infectives: Anti-infectives    None      Assessment/Plan: s/p Procedure(s) (LRB): ESOPHAGOGASTRODUODENOSCOPY (EGD) (N/A) Chronic duodenal ulcer.  Awaiting cardiac clearance.  Plan for surgery on Monday.  Continue current management.  LOS: 5 days    Adisynn Suleiman C 06/25/2011

## 2011-06-25 NOTE — Telephone Encounter (Signed)
Patient to have open antrectory, duodenectomy with roux-en-y gastro-jejunostomy next week for chronic duodenal ulcer with stricture.  Please cancel GES. Please cancel EUS (for chronic pancreatitic duct dilation).  Make f/u appointment in 3 months. Reason for appointment (consider colonoscopy, ?MRCP for dilation of pancreatic duct)
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
## 2011-06-25 NOTE — Progress Notes (Signed)
Subjective:  Ongoing pp abdominal pain. Surgery planned for Monday.  Objective: Vital signs in last 24 hours: Temp:  [98 F (36.7 C)-98.6 F (37 C)] 98 F (36.7 C) (04/05 0500) Pulse Rate:  [74-91] 83  (04/05 0500) Resp:  [16-20] 20  (04/05 0500) BP: (93-121)/(61-76) 93/67 mmHg (04/05 0500) SpO2:  [92 %] 92 % (04/05 0500) Last BM Date: 06/23/11 General:   Alert,  Well-developed, well-nourished, pleasant. Appears uncomfortable. Head:  Normocephalic and atraumatic. Eyes:  Sclera clear, no icterus.   Abdomen:  Soft, epigastric/ruq tenderness. Nondistended. No masses, hepatosplenomegaly or hernias noted. Normal bowel sounds, without guarding, and without rebound.   Extremities:  Without clubbing, deformity or edema. Neurologic:  Alert and  oriented x4;  grossly normal neurologically. Skin:  Intact without significant lesions or rashes. Psych:  Alert and cooperative. Normal mood and affect.  Intake/Output from previous day: 04/04 0701 - 04/05 0700 In: 1896 [P.O.:960; I.V.:920; IV Piggyback:16] Out: 650 [Urine:650] Intake/Output this shift:    Lab Results: CBC  Basename 06/23/11 0953  WBC 4.4  HGB 8.9*  HCT 27.6*  MCV 86.3  PLT 359   BMET  Basename 06/23/11 0953  NA 137  K 4.2  CL 99  CO2 30  GLUCOSE 80  BUN 3*  CREATININE 0.54  CALCIUM 9.3   LFTs  Basename 06/23/11 0953  BILITOT 0.3  BILIDIR --  IBILI --  ALKPHOS 112  AST 18  ALT 9  PROT 5.6*  ALBUMIN 2.9*    Assessment: 56 y/o female with chronic duodenal ulcer with stricture. Anemia stable, consider transfusion prior to surgery. Surgical intervention planned.  Plan: 1. Outpatient colonoscopy at later date. 2. Outpatient EUS for chronic dilated pancreatic duct will be cancelled as it will not be possible with new anatomy post-op. Consider repeat MRCP at later date. 3. Continue to follow peripherally.    LOS: 5 days   Tana Coast  06/25/2011, 8:38 AM

## 2011-06-26 MED ORDER — ENOXAPARIN SODIUM 40 MG/0.4ML ~~LOC~~ SOLN
40.0000 mg | Freq: Once | SUBCUTANEOUS | Status: AC
  Administered 2011-06-28: 40 mg via SUBCUTANEOUS
  Filled 2011-06-26: qty 0.4

## 2011-06-26 MED ORDER — ENOXAPARIN SODIUM 40 MG/0.4ML ~~LOC~~ SOLN: 40.0000 mg | Freq: Once | SUBCUTANEOUS | Status: DC

## 2011-06-26 NOTE — Progress Notes (Signed)
4 Days Post-Op  Subjective: No acute change.  No new issues.  Objective: Vital signs in last 24 hours: Temp:  [98.1 F (36.7 Haney)-98.7 F (37.1 Haney)] 98.1 F (36.7 Haney) (04/06 0457) Pulse Rate:  [70-96] 70  (04/06 0457) Resp:  [18-20] 20  (04/06 0457) BP: (96-104)/(63-69) 96/63 mmHg (04/06 0457) SpO2:  [89 %-98 %] 98 % (04/06 0457) Last BM Date: 06/24/11  Intake/Output from previous day: 04/05 0701 - 04/06 0700 In: 5551 [P.O.:965; I.V.:4570; IV Piggyback:16] Out: -  Intake/Output this shift: Total I/O In: 450 [P.O.:450] Out: -   General appearance: alert and no distress GI: soft, epigastric pain.  Lab Results:  No results found for this basename: WBC:2,HGB:2,HCT:2,PLT:2 in the last 72 hours BMET No results found for this basename: NA:2,K:2,CL:2,CO2:2,GLUCOSE:2,BUN:2,CREATININE:2,CALCIUM:2 in the last 72 hours PT/INR No results found for this basename: LABPROT:2,INR:2 in the last 72 hours ABG No results found for this basename: PHART:2,PCO2:2,PO2:2,HCO3:2 in the last 72 hours  Studies/Results: No results found.  Anti-infectives: Anti-infectives    None      Assessment/Plan: s/p Procedure(s) (LRB): ESOPHAGOGASTRODUODENOSCOPY (EGD) (N/A) Surgery planned for Monday.  Questions answered.  Cardiac work-up noted.  LOS: 6 days    Jordan Haney 06/26/2011

## 2011-06-26 NOTE — Progress Notes (Signed)
Patient has remained stable throughout shift. She refuses SCD's or any other type of VTE whether pharmaceutical or mechanical.  She has received Dilaudid 1 mg IV Q3 hours PRN throughout shift.  States pain is well controlled.  Will continue to monitor.

## 2011-06-26 NOTE — Progress Notes (Signed)
Subjective: Patient reports she has been having significant epigastric pain ever since she took liquids this morning. She had a bowel movement yesterday.  She is feeling nauseous  Objective: Vital signs in last 24 hours: Temp:  [98.1 F (36.7 C)-98.7 F (37.1 C)] 98.1 F (36.7 C) (04/06 0457) Pulse Rate:  [70-96] 70  (04/06 0457) Resp:  [18-20] 20  (04/06 0457) BP: (96-104)/(63-69) 96/63 mmHg (04/06 0457) SpO2:  [89 %-98 %] 98 % (04/06 0457) Weight change:  Last BM Date: 06/24/11  Intake/Output from previous day: 04/05 0701 - 04/06 0700 In: 5551 [P.O.:965; I.V.:4570; IV Piggyback:16] Out: -  Total I/O In: 450 [P.O.:450] Out: -    Physical Exam: General: Alert, awake, oriented x3, in no acute distress. HEENT: No bruits, no goiter. Heart: Regular rate and rhythm, without murmurs, rubs, gallops. Lungs: Clear to auscultation bilaterally. Abdomen: Soft, tender in epigastrium, nondistended, positive bowel sounds. Extremities: No clubbing cyanosis or edema with positive pedal pulses. Neuro: Grossly intact, nonfocal.    Lab Results: Basic Metabolic Panel: No results found for this basename: NA:2,K:2,CL:2,CO2:2,GLUCOSE:2,BUN:2,CREATININE:2,CALCIUM:2,MG:2,PHOS:2 in the last 72 hours Liver Function Tests: No results found for this basename: AST:2,ALT:2,ALKPHOS:2,BILITOT:2,PROT:2,ALBUMIN:2 in the last 72 hours No results found for this basename: LIPASE:2,AMYLASE:2 in the last 72 hours No results found for this basename: AMMONIA:2 in the last 72 hours CBC: No results found for this basename: WBC:2,NEUTROABS:2,HGB:2,HCT:2,MCV:2,PLT:2 in the last 72 hours Cardiac Enzymes: No results found for this basename: CKTOTAL:3,CKMB:3,CKMBINDEX:3,TROPONINI:3 in the last 72 hours BNP: No results found for this basename: PROBNP:3 in the last 72 hours D-Dimer: No results found for this basename: DDIMER:2 in the last 72 hours CBG: No results found for this basename: GLUCAP:6 in the last 72  hours Hemoglobin A1C: No results found for this basename: HGBA1C in the last 72 hours Fasting Lipid Panel: No results found for this basename: CHOL,HDL,LDLCALC,TRIG,CHOLHDL,LDLDIRECT in the last 72 hours Thyroid Function Tests: No results found for this basename: TSH,T4TOTAL,FREET4,T3FREE,THYROIDAB in the last 72 hours Anemia Panel: No results found for this basename: VITAMINB12,FOLATE,FERRITIN,TIBC,IRON,RETICCTPCT in the last 72 hours Coagulation: No results found for this basename: LABPROT:2,INR:2 in the last 72 hours Urine Drug Screen: Drugs of Abuse  No results found for this basename: labopia, cocainscrnur, labbenz, amphetmu, thcu, labbarb    Alcohol Level: No results found for this basename: ETH:2 in the last 72 hours Urinalysis: No results found for this basename: COLORURINE:2,APPERANCEUR:2,LABSPEC:2,PHURINE:2,GLUCOSEU:2,HGBUR:2,BILIRUBINUR:2,KETONESUR:2,PROTEINUR:2,UROBILINOGEN:2,NITRITE:2,LEUKOCYTESUR:2 in the last 72 hours  No results found for this or any previous visit (from the past 240 hour(s)).  Studies/Results: No results found.  Medications: Scheduled Meds:   . adalimumab  40 mg Subcutaneous Q7 days  . amLODipine  5 mg Oral Daily  . feeding supplement  237 mL Oral TID BM  . feeding supplement  237 mL Oral QID  . LORazepam  0.5 mg Oral BID  . metoprolol tartrate  12.5 mg Oral BID  . ondansetron  4 mg Intravenous Once  . ondansetron (ZOFRAN) IV  4 mg Intravenous TID AC  . pantoprazole (PROTONIX) IV  40 mg Intravenous BID AC  . QUEtiapine  100 mg Oral BID  . sodium chloride       Continuous Infusions:   . 0.9 % NaCl with KCl 40 mEq / L 75 mL/hr at 06/26/11 0616   PRN Meds:.HYDROmorphone, nitroGLYCERIN, ondansetron (ZOFRAN) IV, ondansetron  Assessment/Plan:  Active Problems:  Abdominal pain  Rheumatoid arthritis  Duodenal stricture  Anemia  Preoperative evaluation to rule out surgical contraindication  Plan:  Chronic duodenal ulcer  with stricture  for operative repair on Monday 2D echo done is unremarkable. Cardiology following for cardiac clearance She appears to be low risk for an intermediate risk surgery Anemia, will repeat CBC in am  LOS: 6 days   Eisa Conaway [facility] Pager: [phone] 06/26/2011, 12:16 PM

## 2011-06-27 LAB — BASIC METABOLIC PANEL
CO2: 31 mEq/L (ref 19–32)
Calcium: 9.4 mg/dL (ref 8.4–10.5)
Creatinine, Ser: 0.72 mg/dL (ref 0.50–1.10)
GFR calc Af Amer: >90 mL/min (ref >90)
GFR calc non Af Amer: >90 mL/min (ref >90)
Sodium: 139 mEq/L (ref 135–145)

## 2011-06-27 LAB — CBC
MCH: 27.1 pg (ref 26.0–34.0)
Platelets: 375 10*3/uL (ref 150–400)
RBC: 3.29 MIL/uL — ABNORMAL LOW (ref 3.87–5.11)
RDW: 15.6 % — ABNORMAL HIGH (ref 11.5–15.5)

## 2011-06-27 MED ORDER — METOPROLOL TARTRATE 25 MG PO TABS
12.5000 mg | ORAL_TABLET | Freq: Two times a day (BID) | ORAL | Status: DC
  Administered 2011-06-27: 12.5 mg via ORAL
  Filled 2011-06-27: qty 1

## 2011-06-27 MED ORDER — SODIUM CHLORIDE 0.9 % IV BOLUS (SEPSIS)
1000.0000 mL | Freq: Once | INTRAVENOUS | Status: AC
  Administered 2011-06-27: 1000 mL via INTRAVENOUS

## 2011-06-27 NOTE — Progress Notes (Signed)
Subjective: Having epigastric pain after eating, nausea, no new complaints  Objective: Vital signs in last 24 hours: Temp:  [97.3 F (36.3 C)-98.7 F (37.1 C)] 97.8 F (36.6 C) (04/07 1404) Pulse Rate:  [72-75] 72  (04/07 1404) Resp:  [16-18] 18  (04/07 1404) BP: (82-93)/(48-56) 89/51 mmHg (04/07 1404) SpO2:  [88 %-96 %] 91 % (04/07 1404) Weight change:  Last BM Date: 06/26/11  Intake/Output from previous day: 04/06 0701 - 04/07 0700 In: 2451.3 [P.O.:750; I.V.:1701.3] Out: -      Physical Exam: General: Alert, awake, oriented x3, in no acute distress. HEENT: No bruits, no goiter. Heart: Regular rate and rhythm, without murmurs, rubs, gallops. Lungs: Clear to auscultation bilaterally. Abdomen: Soft, epigastric tenderness, nondistended, positive bowel sounds. Extremities: No clubbing cyanosis or edema with positive pedal pulses. Neuro: Grossly intact, nonfocal.    Lab Results: Basic Metabolic Panel:  Basename 06/27/11 0550  NA 139  K 4.6  CL 102  CO2 31  GLUCOSE 94  BUN 6  CREATININE 0.72  CALCIUM 9.4  MG --  PHOS --   Liver Function Tests: No results found for this basename: AST:2,ALT:2,ALKPHOS:2,BILITOT:2,PROT:2,ALBUMIN:2 in the last 72 hours No results found for this basename: LIPASE:2,AMYLASE:2 in the last 72 hours No results found for this basename: AMMONIA:2 in the last 72 hours CBC:  Basename 06/27/11 0550  WBC 3.6*  NEUTROABS --  HGB 8.9*  HCT 28.8*  MCV 87.5  PLT 375   Cardiac Enzymes: No results found for this basename: CKTOTAL:3,CKMB:3,CKMBINDEX:3,TROPONINI:3 in the last 72 hours BNP: No results found for this basename: PROBNP:3 in the last 72 hours D-Dimer: No results found for this basename: DDIMER:2 in the last 72 hours CBG: No results found for this basename: GLUCAP:6 in the last 72 hours Hemoglobin A1C: No results found for this basename: HGBA1C in the last 72 hours Fasting Lipid Panel: No results found for this basename:  CHOL,HDL,LDLCALC,TRIG,CHOLHDL,LDLDIRECT in the last 72 hours Thyroid Function Tests: No results found for this basename: TSH,T4TOTAL,FREET4,T3FREE,THYROIDAB in the last 72 hours Anemia Panel: No results found for this basename: VITAMINB12,FOLATE,FERRITIN,TIBC,IRON,RETICCTPCT in the last 72 hours Coagulation: No results found for this basename: LABPROT:2,INR:2 in the last 72 hours Urine Drug Screen: Drugs of Abuse  No results found for this basename: labopia, cocainscrnur, labbenz, amphetmu, thcu, labbarb    Alcohol Level: No results found for this basename: ETH:2 in the last 72 hours Urinalysis: No results found for this basename: COLORURINE:2,APPERANCEUR:2,LABSPEC:2,PHURINE:2,GLUCOSEU:2,HGBUR:2,BILIRUBINUR:2,KETONESUR:2,PROTEINUR:2,UROBILINOGEN:2,NITRITE:2,LEUKOCYTESUR:2 in the last 72 hours  No results found for this or any previous visit (from the past 240 hour(s)).  Studies/Results: No results found.  Medications: Scheduled Meds:   . adalimumab  40 mg Subcutaneous Q7 days  . amLODipine  5 mg Oral Daily  . enoxaparin  40 mg Subcutaneous Once  . feeding supplement  237 mL Oral TID BM  . feeding supplement  237 mL Oral QID  . LORazepam  0.5 mg Oral BID  . metoprolol tartrate  12.5 mg Oral BID  . ondansetron  4 mg Intravenous Once  . ondansetron (ZOFRAN) IV  4 mg Intravenous TID AC  . pantoprazole (PROTONIX) IV  40 mg Intravenous BID AC  . QUEtiapine  100 mg Oral BID  . sodium chloride  1,000 mL Intravenous Once  . DISCONTD: enoxaparin  40 mg Subcutaneous Once  . DISCONTD: metoprolol tartrate  12.5 mg Oral BID   Continuous Infusions:   . 0.9 % NaCl with KCl 40 mEq / L 75 mL/hr at 06/27/11 1013  PRN Meds:.HYDROmorphone, nitroGLYCERIN, ondansetron (ZOFRAN) IV, ondansetron  Assessment/Plan:  Active Problems:  Abdominal pain  Rheumatoid arthritis  Duodenal stricture  Anemia  Preoperative evaluation to rule out surgical contraindication  This lady was admitted to the  hospital with complaints of abdominal pain, nausea and vomiting. She has a known chronic peptic ulcer and duodenal stricture.  #1 abdominal pain. Patient was seen by gastroenterology and underwent upper endoscopy. Results of this revealed a persistent ulcer in the bulb/descending duodenum. Also showed stricture in the bulb/descending duodenum, not amenable to endoscopic therapy. Gastrin level was checked and was found to be normal. Surgical consultation was provided by Dr. Leticia Penna. Plans are for Roux-en-Y gastrojejunostomy tomorrow.  #2. Anemia. Patient was recently admitted to the hospital with hematemesis and it was noted that she was significantly anemic. She's not had any further bleeding since then. Her hemoglobin has remained stable and she has not required any transfusions at this point. It is recommended that she continue on twice a day PPI. The source of her anemia is likely GERD duodenal ulcer.  #3. Hypotension. Patient was recently started on beta blockers preoperatively. We will hold her Norvasc and give beta blockers as tolerated.  #4. Preoperative evaluation. Due to patient's history of MI in the remote past today's cardiogram was obtained which was unremarkable. Cardiology is following for clearance purposes.  #5. History of a dilated pancreatic duct. For further outpatient evaluation with possible MRCP versus endoscopic ultrasound.   LOS: 7 days   Macklen Wilhoite [facility] Pager: [phone] 06/27/2011, 3:10 PM

## 2011-06-27 NOTE — Progress Notes (Signed)
5 Days Post-Op  Subjective: No acute change.  Objective: Vital signs in last 24 hours: Temp:  [97.3 F (36.3 C)-98.7 F (37.1 C)] 97.8 F (36.6 C) (04/07 1404) Pulse Rate:  [72-83] 83  (04/07 2118) Resp:  [16-18] 18  (04/07 1404) BP: (82-104)/(48-71) 104/71 mmHg (04/07 2118) SpO2:  [88 %-96 %] 91 % (04/07 1404) Last BM Date: 06/26/11  Intake/Output from previous day: 04/06 0701 - 04/07 0700 In: 2451.3 [P.O.:750; I.V.:1701.3] Out: -  Intake/Output this shift:    General appearance: alert and no distress GI: moderate tenderness.  No peritoneal signs.  Lab Results:   Basename 06/27/11 0550  WBC 3.6*  HGB 8.9*  HCT 28.8*  PLT 375   BMET  Basename 06/27/11 0550  NA 139  K 4.6  CL 102  CO2 31  GLUCOSE 94  BUN 6  CREATININE 0.72  CALCIUM 9.4   PT/INR No results found for this basename: LABPROT:2,INR:2 in the last 72 hours ABG No results found for this basename: PHART:2,PCO2:2,PO2:2,HCO3:2 in the last 72 hours  Studies/Results: No results found.  Anti-infectives: Anti-infectives    None      Assessment/Plan: s/p Procedure(s) (LRB): ESOPHAGOGASTRODUODENOSCOPY (EGD) (N/A) Chronic duodenal ulcers.  Will proceed to the OR as discussed.  LOS: 7 days    Anisa Leanos C 06/27/2011

## 2011-06-28 ENCOUNTER — Telehealth: Payer: Self-pay | Admitting: Gastroenterology

## 2011-06-28 ENCOUNTER — Telehealth: Payer: Self-pay

## 2011-06-28 ENCOUNTER — Encounter (HOSPITAL_COMMUNITY): Payer: Self-pay | Admitting: *Deleted

## 2011-06-28 ENCOUNTER — Encounter (HOSPITAL_COMMUNITY): Payer: Self-pay | Admitting: Anesthesiology

## 2011-06-28 ENCOUNTER — Inpatient Hospital Stay (HOSPITAL_COMMUNITY): Admitting: Anesthesiology

## 2011-06-28 ENCOUNTER — Encounter (HOSPITAL_COMMUNITY)
Admission: EM | Disposition: A | Discharge status: Home Health | Payer: Self-pay | Source: Home / Self Care | Attending: General Surgery

## 2011-06-28 HISTORY — PX: GASTROJEJUNOSTOMY: SHX1697

## 2011-06-28 LAB — CBC
MCV: 85.3 fL (ref 78.0–100.0)
Platelets: 337 10*3/uL (ref 150–400)
RBC: 3.34 MIL/uL — ABNORMAL LOW (ref 3.87–5.11)
RDW: 15.4 % (ref 11.5–15.5)
WBC: 4.7 10*3/uL (ref 4.0–10.5)

## 2011-06-28 LAB — BASIC METABOLIC PANEL
CO2: 29 mEq/L (ref 19–32)
Chloride: 102 mEq/L (ref 96–112)
GFR calc Af Amer: >90 mL/min (ref >90)
Potassium: 4.4 mEq/L (ref 3.5–5.1)
Sodium: 137 mEq/L (ref 135–145)

## 2011-06-28 LAB — SURGICAL PCR SCREEN: Staphylococcus aureus: POSITIVE — AB

## 2011-06-28 LAB — HEMOGLOBIN AND HEMATOCRIT, BLOOD: Hemoglobin: 11.2 g/dL — ABNORMAL LOW (ref 12.0–15.0)

## 2011-06-28 SURGERY — ANTRECTOMY, STOMACH
Anesthesia: General | Site: Abdomen | Wound class: Contaminated

## 2011-06-28 MED ORDER — HYDROMORPHONE 0.3 MG/ML IV SOLN
INTRAVENOUS | Status: DC
  Administered 2011-06-29: 5.4 mg via INTRAVENOUS
  Administered 2011-06-29: 4.2 mg via INTRAVENOUS
  Administered 2011-06-29: 0.3 mg via INTRAVENOUS
  Administered 2011-06-29: 4.73 mg via INTRAVENOUS

## 2011-06-28 MED ORDER — MIDAZOLAM HCL 5 MG/5ML IJ SOLN
INTRAMUSCULAR | Status: DC | PRN
  Administered 2011-06-28: 2 mg via INTRAVENOUS

## 2011-06-28 MED ORDER — LACTATED RINGERS IV SOLN
INTRAVENOUS | Status: AC
  Administered 2011-06-28 – 2011-06-30 (×5): via INTRAVENOUS

## 2011-06-28 MED ORDER — MUPIROCIN 2 % EX OINT
TOPICAL_OINTMENT | Freq: Two times a day (BID) | CUTANEOUS | Status: DC
  Administered 2011-06-28: 12:00:00 via NASAL

## 2011-06-28 MED ORDER — NEOSTIGMINE METHYLSULFATE 1 MG/ML IJ SOLN
INTRAMUSCULAR | Status: DC | PRN
  Administered 2011-06-28: 3 mg via INTRAVENOUS

## 2011-06-28 MED ORDER — SODIUM CHLORIDE 0.9 % IV SOLN
1.0000 g | INTRAVENOUS | Status: DC | PRN
  Administered 2011-06-28: 1 g via INTRAVENOUS

## 2011-06-28 MED ORDER — MIDAZOLAM HCL 2 MG/2ML IJ SOLN
INTRAMUSCULAR | Status: AC
  Administered 2011-06-28: 2 mg via INTRAVENOUS
  Filled 2011-06-28: qty 2

## 2011-06-28 MED ORDER — HYDROMORPHONE 0.3 MG/ML IV SOLN
INTRAVENOUS | Status: AC
  Administered 2011-06-29: 0.3 mg
  Filled 2011-06-28: qty 25

## 2011-06-28 MED ORDER — FENTANYL CITRATE 0.05 MG/ML IJ SOLN
INTRAMUSCULAR | Status: AC
  Administered 2011-06-28: 50 ug via INTRAVENOUS
  Filled 2011-06-28: qty 5

## 2011-06-28 MED ORDER — MUPIROCIN 2 % EX OINT
TOPICAL_OINTMENT | CUTANEOUS | Status: AC
  Filled 2011-06-28: qty 22

## 2011-06-28 MED ORDER — SODIUM CHLORIDE 0.9 % IV SOLN
INTRAVENOUS | Status: DC | PRN
  Administered 2011-06-28: 15:00:00 via INTRAVENOUS

## 2011-06-28 MED ORDER — ROCURONIUM BROMIDE 100 MG/10ML IV SOLN
INTRAVENOUS | Status: DC | PRN
  Administered 2011-06-28 (×2): 10 mg via INTRAVENOUS
  Administered 2011-06-28: 40 mg via INTRAVENOUS
  Administered 2011-06-28: 20 mg via INTRAVENOUS
  Administered 2011-06-28 (×2): 10 mg via INTRAVENOUS

## 2011-06-28 MED ORDER — PROPOFOL 10 MG/ML IV BOLUS
INTRAVENOUS | Status: DC | PRN
  Administered 2011-06-28: 150 mg via INTRAVENOUS

## 2011-06-28 MED ORDER — DIPHENHYDRAMINE HCL 50 MG/ML IJ SOLN: 12.5000 mg | Freq: Four times a day (QID) | INTRAMUSCULAR | Status: DC | PRN

## 2011-06-28 MED ORDER — DIPHENHYDRAMINE HCL 12.5 MG/5ML PO ELIX
12.5000 mg | ORAL_SOLUTION | Freq: Four times a day (QID) | ORAL | Status: DC | PRN
  Filled 2011-06-28: qty 5

## 2011-06-28 MED ORDER — ONDANSETRON HCL 4 MG/2ML IJ SOLN: 4.0000 mg | Freq: Once | INTRAMUSCULAR | Status: DC | PRN

## 2011-06-28 MED ORDER — NALOXONE HCL 0.4 MG/ML IJ SOLN: 0.4000 mg | INTRAMUSCULAR | Status: DC | PRN

## 2011-06-28 MED ORDER — ROCURONIUM BROMIDE 50 MG/5ML IV SOLN
INTRAVENOUS | Status: AC
  Filled 2011-06-28: qty 1

## 2011-06-28 MED ORDER — MIDAZOLAM HCL 2 MG/2ML IJ SOLN
INTRAMUSCULAR | Status: AC
  Filled 2011-06-28: qty 2

## 2011-06-28 MED ORDER — ENOXAPARIN SODIUM 40 MG/0.4ML ~~LOC~~ SOLN
40.0000 mg | SUBCUTANEOUS | Status: DC
  Administered 2011-06-28 – 2011-07-14 (×17): 40 mg via SUBCUTANEOUS
  Filled 2011-06-28 (×17): qty 0.4

## 2011-06-28 MED ORDER — SODIUM CHLORIDE 0.9 % IJ SOLN: 9.0000 mL | INTRAMUSCULAR | Status: DC | PRN

## 2011-06-28 MED ORDER — FENTANYL CITRATE 0.05 MG/ML IJ SOLN
INTRAMUSCULAR | Status: AC
  Administered 2011-06-28: 50 ug via INTRAVENOUS
  Filled 2011-06-28: qty 2

## 2011-06-28 MED ORDER — SODIUM CHLORIDE 0.9 % IV SOLN
INTRAVENOUS | Status: AC
  Filled 2011-06-28: qty 1

## 2011-06-28 MED ORDER — ONDANSETRON HCL 4 MG/2ML IJ SOLN
4.0000 mg | Freq: Once | INTRAMUSCULAR | Status: AC
  Administered 2011-06-28: 4 mg via INTRAVENOUS

## 2011-06-28 MED ORDER — GLYCOPYRROLATE 0.2 MG/ML IJ SOLN
INTRAMUSCULAR | Status: AC
  Filled 2011-06-28: qty 1

## 2011-06-28 MED ORDER — SODIUM CHLORIDE 0.9 % IV SOLN
1.0000 g | INTRAVENOUS | Status: DC
  Filled 2011-06-28: qty 1

## 2011-06-28 MED ORDER — POVIDONE-IODINE 10 % EX OINT
TOPICAL_OINTMENT | CUTANEOUS | Status: AC
  Filled 2011-06-28: qty 2

## 2011-06-28 MED ORDER — POVIDONE-IODINE 10 % OINT PACKET
TOPICAL_OINTMENT | CUTANEOUS | Status: DC | PRN
  Administered 2011-06-28: 2 via TOPICAL

## 2011-06-28 MED ORDER — PROPOFOL 10 MG/ML IV EMUL
INTRAVENOUS | Status: AC
  Filled 2011-06-28: qty 20

## 2011-06-28 MED ORDER — EPHEDRINE SULFATE 50 MG/ML IJ SOLN
INTRAMUSCULAR | Status: DC | PRN
  Administered 2011-06-28: 10 mg via INTRAVENOUS

## 2011-06-28 MED ORDER — SODIUM CHLORIDE 0.9 % IV SOLN
1.0000 g | INTRAVENOUS | Status: AC
  Administered 2011-06-29: 1 g via INTRAVENOUS
  Filled 2011-06-28: qty 1

## 2011-06-28 MED ORDER — LACTATED RINGERS IV SOLN
INTRAVENOUS | Status: DC
  Administered 2011-06-28: 1000 mL via INTRAVENOUS
  Administered 2011-06-28 (×3): via INTRAVENOUS

## 2011-06-28 MED ORDER — 0.9 % SODIUM CHLORIDE (POUR BTL) OPTIME
TOPICAL | Status: DC | PRN
  Administered 2011-06-28: 2000 mL

## 2011-06-28 MED ORDER — LIDOCAINE HCL 1 % IJ SOLN
INTRAMUSCULAR | Status: DC | PRN
  Administered 2011-06-28: 40 mg via INTRADERMAL

## 2011-06-28 MED ORDER — MIDAZOLAM HCL 2 MG/2ML IJ SOLN
1.0000 mg | INTRAMUSCULAR | Status: DC | PRN
  Administered 2011-06-28 (×2): 2 mg via INTRAVENOUS

## 2011-06-28 MED ORDER — FENTANYL CITRATE 0.05 MG/ML IJ SOLN
25.0000 ug | INTRAMUSCULAR | Status: DC | PRN
  Administered 2011-06-28 (×4): 50 ug via INTRAVENOUS

## 2011-06-28 MED ORDER — ONDANSETRON HCL 4 MG/2ML IJ SOLN
INTRAMUSCULAR | Status: AC
  Administered 2011-06-28: 4 mg via INTRAVENOUS
  Filled 2011-06-28: qty 2

## 2011-06-28 MED ORDER — VECURONIUM BROMIDE 10 MG IV SOLR
INTRAVENOUS | Status: AC
  Filled 2011-06-28: qty 10

## 2011-06-28 MED ORDER — FENTANYL CITRATE 0.05 MG/ML IJ SOLN
INTRAMUSCULAR | Status: DC | PRN
  Administered 2011-06-28 (×5): 50 ug via INTRAVENOUS

## 2011-06-28 MED ORDER — GLYCOPYRROLATE 0.2 MG/ML IJ SOLN
INTRAMUSCULAR | Status: DC | PRN
  Administered 2011-06-28: 0.4 mg via INTRAVENOUS

## 2011-06-28 SURGICAL SUPPLY — 57 items
APPLIER CLIP 11 MED OPEN (CLIP) ×4
BAG HAMPER (MISCELLANEOUS) ×2 IMPLANT
BLADE HEX COATED 2.75 (ELECTRODE) ×2 IMPLANT
CLIP APPLIE 11 MED OPEN (CLIP) ×2 IMPLANT
CLOTH BEACON ORANGE TIMEOUT ST (SAFETY) ×2 IMPLANT
COVER SURGICAL LIGHT HANDLE (MISCELLANEOUS) ×4 IMPLANT
DRAPE WARM FLUID 44X44 (DRAPE) ×2 IMPLANT
DURAPREP 26ML APPLICATOR (WOUND CARE) ×2 IMPLANT
ELECT REM PT RETURN 9FT ADLT (ELECTROSURGICAL) ×2
ELECTRODE REM PT RTRN 9FT ADLT (ELECTROSURGICAL) ×1 IMPLANT
EVACUATOR 1/8 PVC DRAIN (DRAIN) ×2 IMPLANT
EVACUATOR DRAINAGE 10X20 100CC (DRAIN) ×1 IMPLANT
EVACUATOR SILICONE 100CC (DRAIN) ×1
FORMALIN 10 PREFIL 480ML (MISCELLANEOUS) IMPLANT
GLOVE BIOGEL PI IND STRL 7.0 (GLOVE) ×3 IMPLANT
GLOVE BIOGEL PI IND STRL 7.5 (GLOVE) ×5 IMPLANT
GLOVE BIOGEL PI INDICATOR 7.0 (GLOVE) ×3
GLOVE BIOGEL PI INDICATOR 7.5 (GLOVE) ×5
GLOVE SKINSENSE NS SZ6.5 (GLOVE) ×3
GLOVE SKINSENSE NS SZ7.0 (GLOVE) ×3
GLOVE SKINSENSE STRL SZ6.5 (GLOVE) ×3 IMPLANT
GLOVE SKINSENSE STRL SZ7.0 (GLOVE) ×3 IMPLANT
GOWN STRL REIN XL XLG (GOWN DISPOSABLE) ×12 IMPLANT
HARMONIC SHEARS 14CM COAG (MISCELLANEOUS) IMPLANT
INST SET MAJOR GENERAL (KITS) ×2 IMPLANT
KIT ROOM TURNOVER APOR (KITS) ×2 IMPLANT
LIGASURE IMPACT 36 18CM CVD LR (INSTRUMENTS) IMPLANT
MANIFOLD NEPTUNE II (INSTRUMENTS) ×2 IMPLANT
NS IRRIG 1000ML POUR BTL (IV SOLUTION) ×4 IMPLANT
PACK ABDOMINAL MAJOR (CUSTOM PROCEDURE TRAY) ×2 IMPLANT
PAD ARMBOARD 7.5X6 YLW CONV (MISCELLANEOUS) ×2 IMPLANT
RELOAD PROXIMATE 75MM BLUE (ENDOMECHANICALS) ×8 IMPLANT
RETAINER VISCERA MED (MISCELLANEOUS) IMPLANT
SEALER TISSUE G2 CVD JAW 35 (ENDOMECHANICALS) ×1 IMPLANT
SEALER TISSUE G2 CVD JAW 45CM (ENDOMECHANICALS) ×1
SELF-CATH ×2 IMPLANT
SET BASIN LINEN APH (SET/KITS/TRAYS/PACK) ×2 IMPLANT
SPONGE DRAIN TRACH 4X4 STRL 2S (GAUZE/BANDAGES/DRESSINGS) ×2 IMPLANT
SPONGE GAUZE 4X4 12PLY (GAUZE/BANDAGES/DRESSINGS) ×2 IMPLANT
SPONGE LAP 18X18 X RAY DECT (DISPOSABLE) ×2 IMPLANT
STAPLER PROXIMATE 75MM BLUE (STAPLE) ×2 IMPLANT
STAPLER VISISTAT 35W (STAPLE) ×2 IMPLANT
SUT CHROMIC 0 SH (SUTURE) IMPLANT
SUT ETHILON 3 0 FSL (SUTURE) ×4 IMPLANT
SUT NOVA NAB GS-26 0 60 (SUTURE) ×4 IMPLANT
SUT PROLENE 5 0 PS 3 (SUTURE) ×8 IMPLANT
SUT PROLENE NAB BLUE 3-0 30IN (SUTURE) ×4 IMPLANT
SUT SILK 2 0 (SUTURE) ×1
SUT SILK 2 0 REEL (SUTURE) IMPLANT
SUT SILK 2-0 18XBRD TIE 12 (SUTURE) ×1 IMPLANT
SUT SILK 3 0 (SUTURE)
SUT SILK 3 0 SH CR/8 (SUTURE) ×6 IMPLANT
SUT SILK 3-0 18XBRD TIE 12 (SUTURE) IMPLANT
TAPE CLOTH SURG 4X10 WHT LF (GAUZE/BANDAGES/DRESSINGS) ×2 IMPLANT
TOWEL BLUE STERILE X RAY DET (MISCELLANEOUS) ×2 IMPLANT
TOWEL OR 17X26 4PK STRL BLUE (TOWEL DISPOSABLE) ×2 IMPLANT
TRAY FOLEY CATH 14FR (SET/KITS/TRAYS/PACK) ×2 IMPLANT

## 2011-06-28 NOTE — Transfer of Care (Signed)
Immediate Anesthesia Transfer of Care Note  Patient: Jordan Haney  Procedure(s) Performed: Procedure(s) (LRB): ANTRECTOMY (N/A) GASTROJEJUNOSTOMY (N/A)  Patient Location: ICU  Anesthesia Type: General  Level of Consciousness: awake and patient cooperative  Airway & Oxygen Therapy: Patient Spontanous Breathing and Patient connected to face mask oxygen  Post-op Assessment: Report given to PACU RN, Post -op Vital signs reviewed and stable and Patient moving all extremities  Post vital signs: Reviewed and stable  Complications: No apparent anesthesia complications

## 2011-06-28 NOTE — Telephone Encounter (Signed)
Sent staff message to Chales Abrahams cancelling EUS

## 2011-06-28 NOTE — Progress Notes (Signed)
Subjective: This lady is due to have surgery today by Dr. Leticia Penna. Gastrin level is normal. She has a chronic duodenal ulcer.           Physical Exam: Blood pressure 96/63, pulse 73, temperature 98 F (36.7 C), temperature source Oral, resp. rate 18, height 5\' 4"  (1.626 m), weight 64.411 kg (142 lb), SpO2 93.00%. She looks systemically well. She is anxious. Abdomen is soft and slightly tender in a generalized fashion. Heart sounds are present and normal. Lung fields are clear. She is alert and orientated.   Investigations:     Basic Metabolic Panel:  Basename 06/28/11 0433 06/27/11 0550  NA 137 139  K 4.4 4.6  CL 102 102  CO2 29 31  GLUCOSE 94 94  BUN 8 6  CREATININE 0.69 0.72  CALCIUM 9.4 9.4  MG -- --  PHOS -- --       CBC:  Basename 06/28/11 0433 06/27/11 0550  WBC 4.7 3.6*  NEUTROABS -- --  HGB 9.0* 8.9*  HCT 28.5* 28.8*  MCV 85.3 87.5  PLT 337 375        Medications: I have reviewed the patient's current medications.  Impression: 1. Chronic duodenal ulcer, now for surgery today, including J duodenectomy with a Roux-en-Y gastrojejunostomy. 2. Anemia related to blood loss from duodenal ulcer. 3. Rheumatoid arthritis.     Plan: 1. Surgery today. Will monitor postoperatively.     LOS: 8 days   Wilson Singer Pager [phone]  06/28/2011, 10:16 AM

## 2011-06-28 NOTE — Anesthesia Postprocedure Evaluation (Signed)
  Anesthesia Post-op Note  Patient: Jordan Haney  Procedure(s) Performed: Procedure(s) (LRB): ANTRECTOMY (N/A) GASTROJEJUNOSTOMY (N/A)  Patient Location: PACU and ICU  Anesthesia Type: General  Level of Consciousness: awake, alert  and patient cooperative  Airway and Oxygen Therapy: Patient Spontanous Breathing and Patient connected to face mask oxygen  Post-op Pain: 5 /10, moderate  Post-op Assessment: Post-op Vital signs reviewed, Patient's Cardiovascular Status Stable, Respiratory Function Stable, Patent Airway, No signs of Nausea or vomiting and Pain level controlled  Post-op Vital Signs: Reviewed and stable  Complications: No apparent anesthesia complications

## 2011-06-28 NOTE — Progress Notes (Signed)
Report called to ICU RN.

## 2011-06-28 NOTE — Anesthesia Procedure Notes (Signed)
Procedure Name: Intubation Date/Time: 06/28/2011 1:47 PM Performed by: Glynn Octave E Pre-anesthesia Checklist: Patient identified, Patient being monitored, Timeout performed, Emergency Drugs available and Suction available Patient Re-evaluated:Patient Re-evaluated prior to inductionOxygen Delivery Method: Circle System Utilized Preoxygenation: Pre-oxygenation with 100% oxygen Intubation Type: IV induction, Rapid sequence and Cricoid Pressure applied Ventilation: Mask ventilation without difficulty Laryngoscope Size: Mac and 3 Grade View: Grade I Tube type: Oral Tube size: 7.0 mm Number of attempts: 1 Airway Equipment and Method: stylet Placement Confirmation: ETT inserted through vocal cords under direct vision,  positive ETCO2 and breath sounds checked- equal and bilateral Secured at: 21 cm Tube secured with: Tape Dental Injury: Teeth and Oropharynx as per pre-operative assessment

## 2011-06-28 NOTE — Telephone Encounter (Signed)
Message copied by Honore Wipperfurth D on Mon Jun 28, 2011  8:11 AM ------      Message from: LEWIS, LESLIE S      Created: Fri Jun 25, 2011  9:07 AM       Please cancel GES. Patient no longer needs. 

## 2011-06-28 NOTE — Anesthesia Preprocedure Evaluation (Addendum)
Anesthesia Evaluation  Patient identified by MRN, date of birth, ID band Patient awake    Reviewed: Allergy & Precautions, H&P , NPO status , Patient's Chart, lab work & pertinent test results  History of Anesthesia Complications Negative for: history of anesthetic complications  Airway Mallampati: I      Dental  (+) Teeth Intact   Pulmonary neg pulmonary ROS,  breath sounds clear to auscultation        Cardiovascular - angina+ CAD and + Past MI Rhythm:Regular Rate:Normal     Neuro/Psych    GI/Hepatic PUD (severe N/V recently....pancreatitis), GERD-  Medicated,  Endo/Other    Renal/GU      Musculoskeletal  (+) Arthritis -, Rheumatoid disorders,    Abdominal   Peds  Hematology   Anesthesia Other Findings   Reproductive/Obstetrics                           Anesthesia Physical Anesthesia Plan  ASA: III  Anesthesia Plan: General   Post-op Pain Management:    Induction: Intravenous, Rapid sequence and Cricoid pressure planned  Airway Management Planned: Oral ETT  Additional Equipment:   Intra-op Plan:   Post-operative Plan: Extubation in OR  Informed Consent: I have reviewed the patients History and Physical, chart, labs and discussed the procedure including the risks, benefits and alternatives for the proposed anesthesia with the patient or authorized representative who has indicated his/her understanding and acceptance.     Plan Discussed with:   Anesthesia Plan Comments:         Anesthesia Quick Evaluation

## 2011-06-28 NOTE — Telephone Encounter (Signed)
Ok, thanks.

## 2011-06-28 NOTE — Telephone Encounter (Signed)
Message copied by Irish Elders on Mon Jun 28, 2011  8:11 AM ------      Message from: Tiffany Kocher      Created: Fri Jun 25, 2011  9:07 AM       Please cancel GES. Patient no longer needs.

## 2011-06-28 NOTE — Telephone Encounter (Signed)
Message copied by Donata Duff on Mon Jun 28, 2011  9:13 AM ------      Message from: Cherene Julian D      Created: Mon Jun 28, 2011  8:08 AM       Please cancel EUS. Discussed with Dr. Darrick Penna. Patient supposed to have open antrectory, duodenectomy with roux-en-y gastro-jejunostomy on Monday. Due to patient's new anatomy, EUS likely not possible.              Thanks,            [employer]

## 2011-06-28 NOTE — Telephone Encounter (Signed)
Message copied by Irish Elders on Mon Jun 28, 2011  8:09 AM ------      Message from: Tiffany Kocher      Created: Fri Jun 25, 2011  9:00 AM       Disregard initial staff message today. Please cancel EUS. Discussed with Dr. Darrick Penna. Patient supposed to have open antrectory, duodenectomy with roux-en-y gastro-jejunostomy on Monday. Due to patient's new anatomy, EUS likely not possible.

## 2011-06-28 NOTE — Telephone Encounter (Signed)
Anna with [facility] endo has been called and appt has been cx.

## 2011-06-28 NOTE — Progress Notes (Signed)
Day of Surgery  Subjective: Patient seen and evaluated in the Pre-op area.  No acute changes.    Objective: Vital signs in last 24 hours: Temp:  [97.8 F (36.6 C)-98.7 F (37.1 C)] 98.4 F (36.9 C) (04/08 1159) Pulse Rate:  [70-83] 72  (04/08 1159) Resp:  [18-29] 21  (04/08 1230) BP: (89-132)/(51-81) 108/68 mmHg (04/08 1230) SpO2:  [91 %-99 %] 97 % (04/08 1230) Last BM Date: 06/26/11  Intake/Output from previous day: 04/07 0701 - 04/08 0700 In: 120 [P.O.:120] Out: -  Intake/Output this shift:    General appearance: alert and no distress Resp: clear to auscultation bilaterally Cardio: regular rate and rhythm GI: moderate tenderness.  No peritoneal signs.  Lab Results:   Basename 06/28/11 0433 06/27/11 0550  WBC 4.7 3.6*  HGB 9.0* 8.9*  HCT 28.5* 28.8*  PLT 337 375   BMET  Basename 06/28/11 0433 06/27/11 0550  NA 137 139  K 4.4 4.6  CL 102 102  CO2 29 31  GLUCOSE 94 94  BUN 8 6  CREATININE 0.69 0.72  CALCIUM 9.4 9.4   PT/INR No results found for this basename: LABPROT:2,INR:2 in the last 72 hours ABG No results found for this basename: PHART:2,PCO2:2,PO2:2,HCO3:2 in the last 72 hours  Studies/Results: No results found.  Anti-infectives: Anti-infectives    None      Assessment/Plan: s/p Procedure(s) (LRB): ANTRECTOMY (N/A) GASTROJEJUNOSTOMY (N/A) To OR as discussed.  Risks, benefits, and alternatives again discussed.  LOS: 8 days    Fin Hupp C 06/28/2011

## 2011-06-28 NOTE — Op Note (Signed)
Patient:  Jordan Haney  DOB:  September 14, 1955  MRN:  [id number]   Preop Diagnosis:  Chronic duodenal ulcer and duodenal stricture   Postop Diagnosis:  The same   Procedure:  #1 open antrectomy and duodenectomy,  #2 Roux-en-Y gastric jejunostomy, #3 choledochal duodenostomy,  #4 intra-abdominal drain placement  Surgeon:  Dr. Tilford Pillar  Anes:  General endotracheal  Indications:  Patient is a 56 year old female presented to [facility] with a history of chronic abdominal pain. She has had extensive workup demonstrating a chronic duodenal ulcer. This is been refractory to medical management and PPI treatment. Biopsies have demonstrated no evidence of carcinoma. Due to continued bleeding, gastric outlet obstruction, and chronic pain risks benefits and alternatives of a antrectomy with removal of the first portion of the duodenum were discussed. Risks benefits alternatives were discussed at length with the patient including bleeding infection anastomotic leak as well as intraoperative cardiac and pulmonary events. Her questions and concerns were addressed the patient was consented for the planned procedure.  Procedure note:  Patient was taken to the operating room was placed in supine position on the or table. General anesthesia was a Optician, dispensing and was patient was asleep she is intubated by the nurse anesthetist. At this point a Foley catheter is placed in standard sterile fashion by the operative staff. Her abdomen is prepped with DuraPrep solution and draped in standard fashion. A midline skin incision was created with a 10 blade scalpel over previous midline incision. Additional dissection down to subcuticular tissues carried out using electrocautery. During the dissection I actually identified a small ventral/incisional hernia. Upon entrance into the hernia I used this as my entrance into the peritoneal cavity. At peritoneum was opened both superiorly and inferiorly. They were multiple  intra-abdominal effusions which were taken down with a combination of electrocautery and sharp dissection. I identified the inferior edge of the liver. At this point I identified the distal stomach and on palpation of the duodenum could feel the thickened area. There is no other nodularity, lymphadenopathy, nodules or masses. At this point I turned my attention to mobilizing the duodenum. The pylorus was identified and I began to carefully dissect distally. The tissue around the palpable ulceration was thickened and fibrotic. I did use a right angle clamp to carefully teased through this tissue obtaining hemostasis as needed with medium surgical clips. Due to the amount of thickness in this area I opted at this point to divide the distal stomach and continued the dissection along the posterior aspect of the stomach and first portion of the duodenum.  I divided the gastrocolic ligament proximally along the stomach up to the point of plan division of the stomach. I created a small defect along the lesser curvature with electrocautery and divided the stomach with a GIA 75 standard stapler. At this point I used the Enseal on both the greater and lesser curvature to dissect distally towards the duodenum. Upon reaching the duodenum to return to sharp dissection with Metzenbaum scissors. The area of the ulcer was intimately attached over the portal triad. I continue my dissection along the scrotal surface of the duodenum and actually created a small defect during this dissection into the duodenum. With difficulties proceeding with a distal dissection I opted to kocherized the C-loop of the duodenum and were proximally. This did help me to elevate the duodenum and continued to the posterior dissection sharply. Unfortunately during the latter portion of the dissection I did identify a tear in the anterior  surface of the common bile duct. This was longitudinal in direction. I felt that primary closure of the common bile  duct which leads to stricture, therefore I completed the dissection of the duodenum dividing the distal aspect, and passing discrete specimen off to the back table. Hemostasis was excellent. I placed a lap sponge into this area and proceeded with the distal bypass procedure well the catheter was obtained for planned T-tube placement.  A defect was created in the transverse colon mesocolon. The ligament of Treitz was identified and the small bowel was followed distally to apportionment easily reached up to the stomach. I divided the small bowel this point with a reload of the GIA stapler. The distal limb was brought through the mesenteric defect and pexed to the posterior surface of the stomach with a single 3-0 silk suture. An enterotomy was created in both the small bowel as well as a gastrotomy in the posterior wall of the stomach. A reload of the GIA stapler was utilized to create the stapled anastomosis. The remaining defect was closed with interrupted 3-0 silk sutures. As quite pleased with the gastric anastomosis. To note prior to closure of the remaining defect I did pass the nasogastric tube down the small bowel limb.  The Roux limb was then secured in a side-to-side fashion to the biliary limb. Enterotomies created and a reload of the GIA stapler was used to create the stapled anastomosis.  The remaining defect was closed with a series of interrupted 3-0 silk sutures.  The apex of the anastomosis was re-enforced with another 3-0 silk.  The anastomosis is palpated and is widely patent.    At this point I returned my attention to the CBD injury.  A right angle was inserted gently into the defect to identify the direction of both lumens of the CBD.  Unfortunately, due to the patient's Latex allergy, a T-tube was not an option.  I did initially bring a silicon 8 FR catheter to the field and intubated the proximal duct with plans to bring through the abdominal wall for decompression.  A u-stitch was placed  with 5-0 prolene to secure the catheter.  It was brought through the abdominal wall through a stab incision.  The catheter was secured to the skin with a 3-0 nylon x 2.  A second stab incision was created through which a 10 flat JP drained was advanced.  This was placed along the Right paracolic gutter and adjacent to the repair.  At this point I realized that just through the elevation of the abdominal wall the 8 FR catheter had been dislodged from the CBD.  Without many additional options at this point I proceeded to bring the duodenum over the defect.  I created a small duodenotomy and circumferentially secured the duodenum over the defect.  I then repositioned the JP drain adjacent to the choledochoduodenotomy. The peritoneal cavity was copiously irrigated.  The omentum was tacked with a series of 3-0 silks over the area.    Attention was turned to closure.  The fascia was approximated with 2x 0-looped novafil sutures securing the at the mid-portion of the incision.  The skin edges were approximated with skin staples.  The skin was washed and dried with a moist and dry towel.  Sterile dressings were placed.  The drapes were removed.  The dressings secured.  The patient was brought out of general anesthesia and transferred to the recovery area. At the conclusion of the procedure, all instrument, needle, and sponge  counts were correct.  The patient tolerated the procedure well.      Complications:  CBD injury  EBL:   UOP:   Fluid:  1 unit PRBCs, crystaloid  Specimen:  Stomach antrum and 1st segment duodenum

## 2011-06-28 NOTE — Telephone Encounter (Signed)
GES cancelled- [facility]UC MED aware

## 2011-06-29 LAB — COMPREHENSIVE METABOLIC PANEL
ALT: 23 U/L (ref 0–35)
AST: 57 U/L — ABNORMAL HIGH (ref 0–37)
Albumin: 2.7 g/dL — ABNORMAL LOW (ref 3.5–5.2)
Alkaline Phosphatase: 101 U/L (ref 39–117)
Calcium: 8.7 mg/dL (ref 8.4–10.5)
Potassium: 4.1 mEq/L (ref 3.5–5.1)
Sodium: 134 mEq/L — ABNORMAL LOW (ref 135–145)
Total Protein: 5.8 g/dL — ABNORMAL LOW (ref 6.0–8.3)

## 2011-06-29 LAB — CBC
HCT: 36.5 % (ref 36.0–46.0)
Hemoglobin: 12 g/dL (ref 12.0–15.0)
MCV: 83.3 fL (ref 78.0–100.0)
Platelets: 535 10*3/uL — ABNORMAL HIGH (ref 150–400)
RBC: 4.38 MIL/uL (ref 3.87–5.11)
WBC: 18.5 10*3/uL — ABNORMAL HIGH (ref 4.0–10.5)

## 2011-06-29 MED ORDER — HYDROMORPHONE 0.3 MG/ML IV SOLN
INTRAVENOUS | Status: AC
  Filled 2011-06-29: qty 25

## 2011-06-29 MED ORDER — NALOXONE HCL 0.4 MG/ML IJ SOLN: 0.4000 mg | INTRAMUSCULAR | Status: DC | PRN

## 2011-06-29 MED ORDER — PHENOL 1.4 % MT LIQD
1.0000 | OROMUCOSAL | Status: DC | PRN
  Filled 2011-06-29: qty 177

## 2011-06-29 MED ORDER — FENTANYL 10 MCG/ML IV SOLN
INTRAVENOUS | Status: DC
  Administered 2011-06-29 – 2011-06-30 (×2): via INTRAVENOUS
  Filled 2011-06-29 (×3): qty 50

## 2011-06-29 MED ORDER — DIPHENHYDRAMINE HCL 12.5 MG/5ML PO ELIX
12.5000 mg | ORAL_SOLUTION | Freq: Four times a day (QID) | ORAL | Status: DC | PRN
  Filled 2011-06-29: qty 5

## 2011-06-29 MED ORDER — SODIUM CHLORIDE 0.9 % IJ SOLN
9.0000 mL | INTRAMUSCULAR | Status: DC | PRN
  Administered 2011-06-30: 3 mL via INTRAVENOUS
  Filled 2011-06-29 (×3): qty 3

## 2011-06-29 MED ORDER — HYDROMORPHONE 0.3 MG/ML IV SOLN
INTRAVENOUS | Status: AC
  Administered 2011-06-29: 0.3 mg
  Filled 2011-06-29: qty 25

## 2011-06-29 MED ORDER — DIPHENHYDRAMINE HCL 50 MG/ML IJ SOLN: 12.5000 mg | Freq: Four times a day (QID) | INTRAMUSCULAR | Status: DC | PRN

## 2011-06-29 MED ORDER — HYDROMORPHONE HCL PF 1 MG/ML IJ SOLN
0.5000 mg | INTRAMUSCULAR | Status: DC | PRN
Start: 2011-06-29 — End: 2011-07-05
  Administered 2011-06-29 – 2011-07-01 (×7): 0.5 mg via INTRAVENOUS
  Filled 2011-06-29 (×7): qty 1

## 2011-06-29 MED ORDER — ONDANSETRON HCL 4 MG/2ML IJ SOLN
4.0000 mg | Freq: Four times a day (QID) | INTRAMUSCULAR | Status: DC | PRN
  Administered 2011-06-30 – 2011-07-02 (×2): 4 mg via INTRAVENOUS
  Filled 2011-06-29 (×9): qty 2

## 2011-06-29 NOTE — Progress Notes (Signed)
1 Day Post-Op  Subjective: Pain poorly controlled. Pain is reduced with PCA but duration is not lasting long enough. Patient does complain of nausea. No fevers chills  Objective: Vital signs in last 24 hours: Temp:  [97.7 F (36.5 C)-99.3 F (37.4 C)] 98.3 F (36.8 C) (04/09 1200) Pulse Rate:  [63-117] 113  (04/09 1500) Resp:  [15-30] 19  (04/09 1500) BP: (116-140)/(70-93) 116/91 mmHg (04/09 1500) SpO2:  [95 %-100 %] 98 % (04/09 1500) Weight:  [71.169 kg (156 lb 14.4 oz)] 71.169 kg (156 lb 14.4 oz) (04/09 0500) Last BM Date: 06/27/11  Intake/Output from previous day: 04/08 0701 - 04/09 0700 In: 4405 [I.V.:3875; Blood:350] Out: 1655 [Urine:1200; Drains:205; Blood:250] Intake/Output this shift: Total I/O In: 930 [Other:930] Out: -   General appearance: alert and mild distress Eyes: Pupils equal round reactive extraocular movements are intact no scleral icterus is noted. Resp: clear to auscultation bilaterally Cardio: Tachycardic but regular rhythm. GI: Quiet, soft, moderate to severe expected abdominal tenderness. Dressing is clean dry and intact. JP demonstrates scant yellow-brown drainage.  Lab Results:   Basename 06/29/11 0446 06/28/11 1825 06/28/11 0433  WBC 18.5* -- 4.7  HGB 12.0 11.2* --  HCT 36.5 34.5* --  PLT 535* -- 337   BMET  Basename 06/29/11 0446 06/28/11 0433  NA 134* 137  K 4.1 4.4  CL 98 102  CO2 26 29  GLUCOSE 142* 94  BUN 7 8  CREATININE 0.53 0.69  CALCIUM 8.7 9.4   PT/INR No results found for this basename: LABPROT:2,INR:2 in the last 72 hours ABG No results found for this basename: PHART:2,PCO2:2,PO2:2,HCO3:2 in the last 72 hours  Studies/Results: No results found.  Anti-infectives: Anti-infectives     Start     Dose/Rate Route Frequency Ordered Stop   06/29/11 1300   ertapenem (INVANZ) 1 g in sodium chloride 0.9 % 50 mL IVPB        1 g 100 mL/hr over 30 Minutes Intravenous Every 24 hours 06/28/11 2007 06/29/11 1416   06/28/11 1330    ertapenem (INVANZ) 1 g in sodium chloride 0.9 % 50 mL IVPB  Status:  Discontinued        1 g 100 mL/hr over 30 Minutes Intravenous Every 24 hours 06/28/11 1315 06/28/11 1840   06/28/11 1309   sodium chloride 0.9 % with ertapenem [facility]) ADS Med  Status:  Discontinued     Comments: BELL, ANITRA: cabinet override         06/28/11 1309 06/28/11 1310          Assessment/Plan: s/p Procedure(s) (LRB): ANTRECTOMY (N/A) GASTROJEJUNOSTOMY (N/A) Pulmonary: Continue incentive spirometer, cough, deep breathing. Cardiovascular: Stable. Hemoglobin stable. Tachycardia likely pain related. Gastrointestinal: Quiet bowel sounds as expected. Continue to monitor JP output closely. No evidence of large upper leak at this time we'll continue to monitor. Continue nasogastric tube with plans to obtain upper GI for evaluation of the gastric jejunostomy anastomosis on Friday. Genitourinary: Good urine output. Continue Foley catheter for now. We'll plan to likely continue Foley tomorrow. ID: Continue antibiotics for now due to length of procedure and spillage of enteric fluid from duodenum. We'll continue to watch WBC count and temperature. FEN: EK today. We'll plan to start TPN tomorrow. We'll obtain PICC line tomorrow. General: Continue ICU monitoring for now. If she remains stable likely transfer back to floor tomorrow. Continue PCA for pain control. Will add some Dilaudid for breakthrough pain.  LOS: 9 days    Alivya Wegman C 06/29/2011

## 2011-06-29 NOTE — Addendum Note (Signed)
Addendum  created 06/29/11 0817 by Franco Nones, CRNA   Modules edited:Notes Section

## 2011-06-29 NOTE — Progress Notes (Signed)
Subjective: This lady had surgery yesterday. She had the following: Procedure: #1 open antrectomy and duodenectomy, #2 Roux-en-Y gastric jejunostomy, #3 choledochal duodenostomy, #4 intra-abdominal drain placement. Looking at the operative notes, it appeared that she had common bile duct injury which was dealt with. Postoperatively, she looks hemodynamically stable, is in the intensive care unit for the time being. She does have abdominal pain, which is expected.          Physical Exam: Blood pressure 123/80, pulse 112, temperature 98.9 F (37.2 C), temperature source Oral, resp. rate 26, height 5\' 4"  (1.626 m), weight 71.169 kg (156 lb 14.4 oz), SpO2 97.00%. Check she does look systemically well. She is not toxic or septic. She is hemodynamically stable. Lung fields are clear. Heart sounds are present and normal. She is alert and orientated without any obvious focal neurological signs. I did not palpate her abdomen, as she did not wish me to secondary to pain.   Investigations:     Basic Metabolic Panel:  Basename 06/29/11 0446 06/28/11 0433  NA 134* 137  K 4.1 4.4  CL 98 102  CO2 26 29  GLUCOSE 142* 94  BUN 7 8  CREATININE 0.53 0.69  CALCIUM 8.7 9.4  MG 1.5 --  PHOS 4.0 --       CBC:  Basename 06/29/11 0446 06/28/11 1825 06/28/11 0433  WBC 18.5* -- 4.7  NEUTROABS -- -- --  HGB 12.0 11.2* --  HCT 36.5 34.5* --  MCV 83.3 -- 85.3  PLT 535* -- 337        Medications: I have reviewed the patient's current medications.  Impression: 1. Chronic duodenal ulcer, status post surgery yesterday by Dr. Leticia Penna. Appears to be hemodynamically stable.     Plan: 1. Continue postoperative care. Her last Dr. Leticia Penna to formally take over the patient and assume the role of attending physician.     LOS: 9 days   Wilson Singer Pager [phone]  06/29/2011, 7:57 AM

## 2011-06-29 NOTE — Anesthesia Postprocedure Evaluation (Signed)
Anesthesia Post Note  Patient: Jordan Haney  Procedure(s) Performed: Procedure(s) (LRB): ANTRECTOMY (N/A) GASTROJEJUNOSTOMY (N/A)  Anesthesia type: General  Patient location: ICU-8  Post pain: Pain level moderate  Post assessment: Post-op Vital signs reviewed, Patient's Cardiovascular Status Stable, Respiratory Function Stable, Patent Airway, No signs of Nausea or vomiting and Pain level controlle   Post vital signs: Reviewed and stable  Level of consciousness: awake and alert   Complications: No apparent anesthesia complications

## 2011-06-29 NOTE — Progress Notes (Signed)
Patient sat up in chair for approximately 2.5 hours and tolerated well. Patient was 2 person minimal assist up with lots of encouragement.

## 2011-06-30 ENCOUNTER — Inpatient Hospital Stay (HOSPITAL_COMMUNITY)

## 2011-06-30 ENCOUNTER — Encounter (HOSPITAL_COMMUNITY)

## 2011-06-30 LAB — TYPE AND SCREEN
ABO/RH(D): O POS
Antibody Screen: NEGATIVE
Unit division: 0
Unit division: 0

## 2011-06-30 LAB — COMPREHENSIVE METABOLIC PANEL
ALT: 13 U/L (ref 0–35)
AST: 25 U/L (ref 0–37)
Albumin: 2.3 g/dL — ABNORMAL LOW (ref 3.5–5.2)
Alkaline Phosphatase: 80 U/L (ref 39–117)
Potassium: 3.6 mEq/L (ref 3.5–5.1)
Sodium: 136 mEq/L (ref 135–145)
Total Protein: 5.2 g/dL — ABNORMAL LOW (ref 6.0–8.3)

## 2011-06-30 LAB — CBC
HCT: 32.2 % — ABNORMAL LOW (ref 36.0–46.0)
Hemoglobin: 10.6 g/dL — ABNORMAL LOW (ref 12.0–15.0)
MCH: 27.4 pg (ref 26.0–34.0)
MCV: 83.2 fL (ref 78.0–100.0)
RBC: 3.87 MIL/uL (ref 3.87–5.11)

## 2011-06-30 LAB — MAGNESIUM: Magnesium: 1.5 mg/dL (ref 1.5–2.5)

## 2011-06-30 MED ORDER — HYDROMORPHONE 0.3 MG/ML IV SOLN
INTRAVENOUS | Status: DC
Start: 2011-06-30 — End: 2011-06-30
  Administered 2011-06-30: 09:00:00 via INTRAVENOUS
  Administered 2011-06-30: 2.7 mg via INTRAVENOUS

## 2011-06-30 MED ORDER — TRACE MINERALS CR-CU-MN-SE-ZN 10-1000-500-60 MCG/ML IV SOLN
INTRAVENOUS | Status: AC
  Administered 2011-06-30: 17:00:00 via INTRAVENOUS
  Filled 2011-06-30: qty 2000

## 2011-06-30 MED ORDER — KETOROLAC TROMETHAMINE 30 MG/ML IJ SOLN
30.0000 mg | Freq: Once | INTRAMUSCULAR | Status: AC
  Administered 2011-06-30: 30 mg via INTRAVENOUS
  Filled 2011-06-30: qty 1

## 2011-06-30 MED ORDER — SODIUM CHLORIDE 0.9 % IJ SOLN
10.0000 mL | INTRAMUSCULAR | Status: DC | PRN
  Administered 2011-07-04 – 2011-07-11 (×3): 10 mL
  Filled 2011-06-30: qty 3
  Filled 2011-06-30: qty 6
  Filled 2011-06-30 (×4): qty 3
  Filled 2011-06-30 (×2): qty 6
  Filled 2011-06-30: qty 3
  Filled 2011-06-30: qty 6

## 2011-06-30 MED ORDER — LACTATED RINGERS IV SOLN
INTRAVENOUS | Status: AC
  Administered 2011-06-30 – 2011-07-01 (×2): via INTRAVENOUS

## 2011-06-30 MED ORDER — LORAZEPAM 2 MG/ML IJ SOLN
1.0000 mg | Freq: Four times a day (QID) | INTRAMUSCULAR | Status: DC
  Administered 2011-06-30 – 2011-07-03 (×10): 1 mg via INTRAVENOUS
  Filled 2011-06-30 (×11): qty 1

## 2011-06-30 MED ORDER — SODIUM CHLORIDE 0.9 % IJ SOLN
10.0000 mL | Freq: Two times a day (BID) | INTRAMUSCULAR | Status: DC
  Administered 2011-06-30 – 2011-07-10 (×14): 10 mL
  Administered 2011-07-11 – 2011-07-12 (×2): 20 mL
  Administered 2011-07-13 – 2011-07-15 (×4): 10 mL
  Administered 2011-07-16: 20 mL
  Filled 2011-06-30 (×6): qty 3
  Filled 2011-06-30: qty 6
  Filled 2011-06-30 (×7): qty 3
  Filled 2011-06-30: qty 6

## 2011-06-30 MED ORDER — HYDROMORPHONE 0.3 MG/ML IV SOLN
INTRAVENOUS | Status: DC
  Administered 2011-06-30: 1.5 mg via INTRAVENOUS
  Administered 2011-07-01: 1.8 mg via INTRAVENOUS
  Administered 2011-07-01: 2.4 mg via INTRAVENOUS
  Administered 2011-07-01: 1.5 mg via INTRAVENOUS
  Administered 2011-07-02: 0.719 mg via INTRAVENOUS
  Administered 2011-07-02: 0.6 mg via INTRAVENOUS
  Administered 2011-07-02: 0.72 mg via INTRAVENOUS
  Administered 2011-07-03 – 2011-07-04 (×2): via INTRAVENOUS
  Administered 2011-07-04: 2.4 mg via INTRAVENOUS
  Administered 2011-07-05: 15:00:00 via INTRAVENOUS

## 2011-06-30 MED ORDER — HYDROMORPHONE 0.3 MG/ML IV SOLN
INTRAVENOUS | Status: AC
  Administered 2011-06-30: 0.3 mg
  Filled 2011-06-30: qty 25

## 2011-06-30 MED ORDER — HYDROMORPHONE 0.3 MG/ML IV SOLN
INTRAVENOUS | Status: AC
  Filled 2011-06-30: qty 25

## 2011-06-30 MED ORDER — SODIUM CHLORIDE 0.9 % IJ SOLN
INTRAMUSCULAR | Status: AC
  Filled 2011-06-30: qty 3

## 2011-06-30 MED ORDER — FAT EMULSION 20 % IV EMUL
250.0000 mL | INTRAVENOUS | Status: AC
  Administered 2011-06-30: 250 mL via INTRAVENOUS
  Filled 2011-06-30: qty 250

## 2011-06-30 MED ORDER — FLUCONAZOLE IN SODIUM CHLORIDE 200-0.9 MG/100ML-% IV SOLN
200.0000 mg | INTRAVENOUS | Status: DC
  Administered 2011-06-30 – 2011-07-15 (×16): 200 mg via INTRAVENOUS
  Filled 2011-06-30 (×21): qty 100

## 2011-06-30 NOTE — Progress Notes (Signed)
2 Days Post-Op  Subjective: Pain poorly controlled.  Patient states better control on Dilaudid PCA.  No nausea.  No fever or chills.  Objective: Vital signs in last 24 hours: Temp:  [98.3 F (36.8 C)-99.5 F (37.5 C)] 98.4 F (36.9 C) (04/10 2000) Pulse Rate:  [103-134] 134  (04/10 2100) Resp:  [12-31] 24  (04/10 2100) BP: (105-148)/(65-98) 135/90 mmHg (04/10 2100) SpO2:  [95 %-99 %] 97 % (04/10 2100) Weight:  [71.1 kg (156 lb 12 oz)] 71.1 kg (156 lb 12 oz) (04/10 0500) Last BM Date: 06/27/11  Intake/Output from previous day: 04/09 0701 - 04/10 0700 In: 2565 [I.V.:1295] Out: 510 [Urine:370; Emesis/NG output:90; Drains:50] Intake/Output this shift: Total I/O In: -  Out: 40 [Drains:40]  General appearance: alert and mild distress Eyes: PERR, EOMI, no scleral icterus Resp: clear to auscultation bilaterally Cardio: tachycardic.  Regular rhythm. GI: Quiet, SOFT, mild distention.  Expected tenderness.  Incision c/d/i.  Dressing inplace.  JP is red-brown in color.  No diffuse peritoneal signs. Extremities: no edema, redness or tenderness in the calves or thighs  Lab Results:   Basename 06/30/11 0503 06/29/11 0446  WBC 21.1* 18.5*  HGB 10.6* 12.0  HCT 32.2* 36.5  PLT 411* 535*   BMET  Basename 06/30/11 0503 06/29/11 0446  NA 136 134*  K 3.6 4.1  CL 100 98  CO2 28 26  GLUCOSE 119* 142*  BUN 15 7  CREATININE 0.59 0.53  CALCIUM 8.6 8.7   PT/INR No results found for this basename: LABPROT:2,INR:2 in the last 72 hours ABG No results found for this basename: PHART:2,PCO2:2,PO2:2,HCO3:2 in the last 72 hours  Studies/Results: Dg Chest Port 1 View  06/30/2011  *RADIOLOGY REPORT*  Clinical Data: Confirm line placement.  PORTABLE CHEST - 1 VIEW  Comparison: None.  Findings: Right PICC line is in place with the tip at the cavoatrial junction.  NG tube is seen in the stomach.  Bibasilar atelectasis.  Heart is normal size.  No effusions.  IMPRESSION: Bibasilar atelectasis.   Right PICC line tip at the cavoatrial junction.  Original Report Authenticated By: Cyndie Chime, M.D.    Anti-infectives: Anti-infectives     Start     Dose/Rate Route Frequency Ordered Stop   06/30/11 0900   fluconazole (DIFLUCAN) IVPB 200 mg        200 mg 100 mL/hr over 60 Minutes Intravenous Every 24 hours 06/30/11 0807     06/29/11 1300   ertapenem (INVANZ) 1 g in sodium chloride 0.9 % 50 mL IVPB        1 g 100 mL/hr over 30 Minutes Intravenous Every 24 hours 06/28/11 2007 06/29/11 1416   06/28/11 1330   ertapenem (INVANZ) 1 g in sodium chloride 0.9 % 50 mL IVPB  Status:  Discontinued        1 g 100 mL/hr over 30 Minutes Intravenous Every 24 hours 06/28/11 1315 06/28/11 1840   06/28/11 1309   sodium chloride 0.9 % with ertapenem [facility]) ADS Med  Status:  Discontinued     Comments: BELL, ANITRA: cabinet override         06/28/11 1309 06/28/11 1310          Assessment/Plan: s/p Procedure(s) (LRB): ANTRECTOMY (N/A) GASTROJEJUNOSTOMY (N/A) Pulmonary:  continue IS usage.  CVS:  tachy but I feel this is related to patient's pain issues.  GI:  continue bowel rest.  No evidence of overt leak at this time.  COntinue NG.  GU:  good UOP.  Per patient request and pain issues, will keep Foley inplace until tomorrow.  ID:  elevation of WBC.  Have added diflucan due to gastric portion of case.  FEN:  PICC placed for initiation of TPN.  Electrolytes ok.  GEN:  continue monitoring in unit.  +/- transfer to the floor tomorrow, pending response and progression in the next 24 hours.  LOS: 10 days    Jordan Haney C 06/30/2011

## 2011-06-30 NOTE — Progress Notes (Signed)
UR Chart Review Completed  

## 2011-06-30 NOTE — Progress Notes (Signed)
   CARE MANAGEMENT NOTE 06/30/2011  Patient:  RANIA, PROTHERO   Account Number:  [id number]  Date Initiated:  06/21/2011  Documentation initiated by:  Rosemary Holms  Subjective/Objective Assessment:   Pt admitted with abdominal pain. PTA independent with ADL.     Action/Plan:   Spoke to pt at bedside. No HH needs identified at this time.   Anticipated DC Date:  06/30/2011   Anticipated DC Plan:  HOME W HOME HEALTH SERVICES      DC Planning Services  CM consult      Choice offered to / List presented to:             Status of service:  In process, will continue to follow Medicare Important Message given?   (If response is "NO", the following Medicare IM given date fields will be blank) Date Medicare IM given:   Date Additional Medicare IM given:    Discharge Disposition:    Per UR Regulation:    If discussed at Long Length of Stay Meetings, dates discussed:    Comments:  06/30/11 1505 Arlyss Queen, RN BSN CM Pt having PICC line insterted on today and wil start TNA. Spoke with pts surgeon and he agrees that LTAC is a potential discharge plan. Pt will be ready toward the end of next week for possible discharge to LTAC. MD stated that pt needs more testing this week to make sure that there are no complications of surgery. Will follow and make referrals when MD gives the order for LTAC.   [id number] Amy Robson RN BSN CM Pt appears to be proceeded to surgery on monday. CM will follow postoperatively for [facility] DME needs  06/21/11 1000 Amy Leanord Hawking RN BSN

## 2011-06-30 NOTE — Consult Note (Signed)
PARENTERAL NUTRITION CONSULT NOTE - INITIAL  Pharmacy Consult for TPN Indication: s/p antrectomy, anticipated ileus, delay in starting enteral feeding  Allergies  Allergen Reactions  . Compazine Shortness Of Breath    Tongue swelling  . Phenergan Shortness Of Breath    Tongue swelling  . Vistaril (Hyzine) Shortness Of Breath    Tongue swelling  . Latex Swelling   Patient Measurements: Height: 5\' 4"  (162.6 cm) Weight: 156 lb 12 oz (71.1 kg) IBW/kg (Calculated) : 54.7   Vital Signs: Temp: 98.3 F (36.8 C) (04/10 0800) Temp src: Oral (04/10 0800) BP: 109/71 mmHg (04/10 0900) Pulse Rate: 108  (04/10 0900) Intake/Output from previous day: 04/09 0701 - 04/10 0700 In: 2465 [I.V.:1195] Out: 510 [Urine:370; Emesis/NG output:90; Drains:50] Intake/Output from this shift: Total I/O In: 112 [IV Piggyback:112] Out: -   Labs:  Basename 06/30/11 0503 06/29/11 0446 06/28/11 1825 06/28/11 0433  WBC 21.1* 18.5* -- 4.7  HGB 10.6* 12.0 11.2* --  HCT 32.2* 36.5 34.5* --  PLT 411* 535* -- 337  APTT -- -- -- --  INR -- -- -- --     Basename 06/30/11 0503 06/29/11 0446 06/28/11 0433  NA 136 134* 137  K 3.6 4.1 4.4  CL 100 98 102  CO2 28 26 29   GLUCOSE 119* 142* 94  BUN 15 7 8   CREATININE 0.59 0.53 0.69  LABCREA -- -- --  CREAT24HRUR -- -- --  CALCIUM 8.6 8.7 9.4  MG 1.5 1.5 --  PHOS 2.3 4.0 --  PROT 5.2* 5.8* --  ALBUMIN 2.3* 2.7* --  AST 25 57* --  ALT 13 23 --  ALKPHOS 80 101 --  BILITOT 0.5 0.4 --  BILIDIR -- -- --  IBILI -- -- --  PREALBUMIN -- -- --  TRIG -- -- --  CHOLHDL -- -- --  CHOL -- -- --   Estimated Creatinine Clearance: 76.9 ml/min (by C-G formula based on Cr of 0.59).   No results found for this basename: GLUCAP:3 in the last 72 hours  Medical History: Past Medical History  Diagnosis Date  . RA (rheumatoid arthritis)  2008  . MI (myocardial infarction) 2008    Not well documented, patient reports reassuring cardiac catheterization and stress  testing in [place]  . Constipation 03/21/2011  . Hypokalemia 03/21/2011  . Fatty liver 03/21/2011  . Pancreatitis     states 3 years ago, very severe, ?biliary pancreatitis   . T12 compression fracture 2011  . Duodenal ulcer     remote per patient. +BC powders, patient report negative EGD six months ago  . Dilated pancreatic duct     ?chronic pancreatitis, EUS pending (06/02/11)   Medications:  Scheduled:    . adalimumab  40 mg Subcutaneous Q7 days  . enoxaparin  40 mg Subcutaneous Q24H  . ertapenem (INVANZ) IV  1 g Intravenous Q24H  . fluconazole (DIFLUCAN) IV  200 mg Intravenous Q24H  . HYDROmorphone PCA 0.3 mg/mL   Intravenous Q4H  . HYDROmorphone PCA 0.3 mg/mL      . HYDROmorphone PCA 0.3 mg/mL      . ketorolac  30 mg Intravenous Once  . LORazepam  1 mg Intravenous Q6H  . ondansetron  4 mg Intravenous Once  . ondansetron (ZOFRAN) IV  4 mg Intravenous TID AC  . pantoprazole (PROTONIX) IV  40 mg Intravenous BID AC  . sodium chloride  10-40 mL Intracatheter Q12H  . sodium chloride      . DISCONTD: fentaNYL  Intravenous Q4H  . DISCONTD: HYDROmorphone PCA 0.3 mg/mL   Intravenous Q4H    Insulin Requirements in the past 24 hours:  n/a  Current Nutrition:  N/a (initiating TPN)  Assessment: Renal fxn OK Lytes OK hypoalbuminemia  Nutritional Goals:  ~1900-2000 non-protein kCal, 110-130 grams of protein per day  Plan:  Initiate TPN at 54ml/hr today Adjust LR ivf's MVI, trace elements, and Lipids every Mon-Wed-Fri due to shortages Labs per protocol Monitor lytes, fluid status, glucose tolerance, renal fxn  Margo Aye, Jasten Guyette A 06/30/2011,11:31 AM

## 2011-07-01 ENCOUNTER — Encounter (HOSPITAL_COMMUNITY)

## 2011-07-01 ENCOUNTER — Encounter (HOSPITAL_COMMUNITY): Payer: Self-pay | Admitting: General Surgery

## 2011-07-01 LAB — COMPREHENSIVE METABOLIC PANEL
ALT: 12 U/L (ref 0–35)
AST: 34 U/L (ref 0–37)
Albumin: 2 g/dL — ABNORMAL LOW (ref 3.5–5.2)
Calcium: 8.1 mg/dL — ABNORMAL LOW (ref 8.4–10.5)
Creatinine, Ser: 0.47 mg/dL — ABNORMAL LOW (ref 0.50–1.10)
Sodium: 133 mEq/L — ABNORMAL LOW (ref 135–145)
Total Protein: 5 g/dL — ABNORMAL LOW (ref 6.0–8.3)

## 2011-07-01 LAB — DIFFERENTIAL
Basophils Relative: 0 % (ref 0–1)
Lymphocytes Relative: 6 % — ABNORMAL LOW (ref 12–46)
Lymphs Abs: 1.2 10*3/uL (ref 0.7–4.0)
Monocytes Absolute: 1.5 10*3/uL — ABNORMAL HIGH (ref 0.1–1.0)
Monocytes Relative: 8 % (ref 3–12)
Neutro Abs: 16.7 10*3/uL — ABNORMAL HIGH (ref 1.7–7.7)
Neutrophils Relative %: 86 % — ABNORMAL HIGH (ref 43–77)

## 2011-07-01 LAB — GLUCOSE, CAPILLARY: Glucose-Capillary: 113 mg/dL — ABNORMAL HIGH (ref 70–99)

## 2011-07-01 LAB — CHOLESTEROL, TOTAL: Cholesterol: 82 mg/dL (ref 0–200)

## 2011-07-01 LAB — CBC
HCT: 30.2 % — ABNORMAL LOW (ref 36.0–46.0)
Hemoglobin: 9.8 g/dL — ABNORMAL LOW (ref 12.0–15.0)
MCV: 83.4 fL (ref 78.0–100.0)
RBC: 3.62 MIL/uL — ABNORMAL LOW (ref 3.87–5.11)
RDW: 15.6 % — ABNORMAL HIGH (ref 11.5–15.5)
WBC: 19.4 10*3/uL — ABNORMAL HIGH (ref 4.0–10.5)

## 2011-07-01 LAB — PHOSPHORUS: Phosphorus: 2.3 mg/dL (ref 2.3–4.6)

## 2011-07-01 MED ORDER — HYDROMORPHONE 0.3 MG/ML IV SOLN
INTRAVENOUS | Status: AC
  Administered 2011-07-01: 06:00:00
  Filled 2011-07-01: qty 25

## 2011-07-01 MED ORDER — CLINIMIX E/DEXTROSE (4.25/5) 4.25 % IV SOLN
INTRAVENOUS | Status: AC
  Administered 2011-07-01: 18:00:00 via INTRAVENOUS
  Filled 2011-07-01: qty 2000

## 2011-07-01 MED ORDER — INSULIN ASPART 100 UNIT/ML ~~LOC~~ SOLN
0.0000 [IU] | SUBCUTANEOUS | Status: DC
  Administered 2011-07-01 (×2): 1 [IU] via SUBCUTANEOUS
  Administered 2011-07-03 – 2011-07-07 (×9): 0 [IU] via SUBCUTANEOUS
  Administered 2011-07-08 – 2011-07-09 (×4): 1 [IU] via SUBCUTANEOUS

## 2011-07-01 MED ORDER — ACETAMINOPHEN 650 MG RE SUPP
650.0000 mg | RECTAL | Status: DC | PRN
  Administered 2011-07-01 – 2011-07-11 (×5): 650 mg via RECTAL
  Filled 2011-07-01 (×5): qty 1

## 2011-07-01 MED ORDER — LACTATED RINGERS IV SOLN
INTRAVENOUS | Status: DC
  Administered 2011-07-02 – 2011-07-04 (×2): via INTRAVENOUS

## 2011-07-01 MED ORDER — HYDROMORPHONE 0.3 MG/ML IV SOLN
INTRAVENOUS | Status: AC
  Administered 2011-07-01: 20:00:00
  Filled 2011-07-01: qty 25

## 2011-07-01 NOTE — Progress Notes (Signed)
INITIAL ADULT NUTRITION ASSESSMENT Date: 07/01/2011   Time: 9:31 AM Reason for Assessment: TPN initiated  ASSESSMENT: Female 56 y.o.  Dx: s/p antrectomy, gastrojejunostomy   Past Medical History  Diagnosis Date  . RA (rheumatoid arthritis)  2008  . MI (myocardial infarction) 2008    Not well documented, patient reports reassuring cardiac catheterization and stress testing in [place]  . Constipation 03/21/2011  . Hypokalemia 03/21/2011  . Fatty liver 03/21/2011  . Pancreatitis     states 3 years ago, very severe, ?biliary pancreatitis   . T12 compression fracture 2011  . Duodenal ulcer     remote per patient. +BC powders, patient report negative EGD six months ago  . Dilated pancreatic duct     ?chronic pancreatitis, EUS pending (06/02/11)    Scheduled Meds:   . adalimumab  40 mg Subcutaneous Q7 days  . enoxaparin  40 mg Subcutaneous Q24H  . fluconazole (DIFLUCAN) IV  200 mg Intravenous Q24H  . HYDROmorphone PCA 0.3 mg/mL   Intravenous Q4H  . HYDROmorphone PCA 0.3 mg/mL      . HYDROmorphone PCA 0.3 mg/mL      . LORazepam  1 mg Intravenous Q6H  . ondansetron  4 mg Intravenous Once  . ondansetron (ZOFRAN) IV  4 mg Intravenous TID AC  . pantoprazole (PROTONIX) IV  40 mg Intravenous BID AC  . sodium chloride  10-40 mL Intracatheter Q12H  . sodium chloride      . DISCONTD: HYDROmorphone PCA 0.3 mg/mL   Intravenous Q4H   Continuous Infusions:   . TPN (CLINIMIX) +/- additives 50 mL/hr at 06/30/11 1722   And  . fat emulsion 250 mL (06/30/11 1722)  . lactated ringers 100 mL/hr at 06/30/11 1700  . lactated ringers 50 mL/hr at 07/01/11 0600   PRN Meds:.acetaminophen, diphenhydrAMINE, diphenhydrAMINE, HYDROmorphone (DILAUDID) injection, naloxone, ondansetron (ZOFRAN) IV, phenol, sodium chloride, sodium chloride  Ht: 5\' 4"  (162.6 cm)  Wt: 152 lb 1.9 oz (69 kg)  Ideal Wt: 54.7 kg  % Ideal Wt: 126%  Usual Wt: 142#  (06/20/11) % Usual Wt: 107%  Body mass index is 26.11  kg/(m^2). Overweight  Food/Nutrition Related Hx: Pt s/p antrectomy and gastrojejunostomy. NG tube in place. Bowel rest currently and TPN being initiated @ 50 ml/hr.    CMP     Component Value Date/Time   NA 133* 07/01/2011 0548   K 3.6 07/01/2011 0548   CL 101 07/01/2011 0548   CO2 25 07/01/2011 0548   GLUCOSE 158* 07/01/2011 0548   BUN 15 07/01/2011 0548   CREATININE 0.47* 07/01/2011 0548   CALCIUM 8.1* 07/01/2011 0548   PROT 5.0* 07/01/2011 0548   ALBUMIN 2.0* 07/01/2011 0548   AST 34 07/01/2011 0548   ALT 12 07/01/2011 0548   ALKPHOS 82 07/01/2011 0548   BILITOT 1.0 07/01/2011 0548   GFRNONAA >90 07/01/2011 0548   GFRAA >90 07/01/2011 0548    Intake/Output Summary (Last 24 hours) at 07/01/11 0940 Last data filed at 07/01/11 0600  Gross per 24 hour  Intake 1795.6 ml  Output    760 ml  Net 1035.6 ml    Diet Order: NPO  Supplements/Tube Feeding: none at this time  IVF:    TPN [facility]) +/- additives Last Rate: 50 mL/hr at 06/30/11 1722  And   fat emulsion Last Rate: 250 mL (06/30/11 1722)  lactated ringers Last Rate: 100 mL/hr at 06/30/11 1700  lactated ringers Last Rate: 50 mL/hr at 07/01/11 0600    Estimated Nutritional  Needs:   Kcal:1725-2070 kcal per day Protein:104-117 grams per day Fluid:1 ml per kcal  NUTRITION DIAGNOSIS: -Inadequate oral intake (NI-2.1).  Status: Ongoing  RELATED TO: bowel rest  AS EVIDENCE BY: NPO,  s/p antrectomy and gastrojejunostomy  MONITORING/EVALUATION(Goals): -TPN advancement, weight status and labs  EDUCATION NEEDS: -Education not appropriate at this time  INTERVENTION: -RD to monitor for nutritional needs  Dietitian [phone]  DOCUMENTATION CODES Per approved criteria  -Not Applicable    Francene Boyers 07/01/2011, 9:31 AM

## 2011-07-01 NOTE — Progress Notes (Signed)
Notified Dr. Leticia Penna of pt's fever 102.2 and requested blood cx and tylenol.  New order for tylenol pr received.

## 2011-07-01 NOTE — Progress Notes (Deleted)
Dressing changed to surgical site,some bleeding noted.

## 2011-07-01 NOTE — Consult Note (Signed)
PARENTERAL NUTRITION CONSULT NOTE   Pharmacy Consult for TPN Indication: s/p antrectomy, anticipated ileus, delay in starting enteral feeding  Allergies  Allergen Reactions  . Compazine Shortness Of Breath    Tongue swelling  . Phenergan Shortness Of Breath    Tongue swelling  . Vistaril (Hyzine) Shortness Of Breath    Tongue swelling  . Latex Swelling   Patient Measurements: Height: 5\' 4"  (162.6 cm) Weight: 152 lb 1.9 oz (69 kg) IBW/kg (Calculated) : 54.7   Vital Signs: Temp: 102.2 F (39 C) (04/11 0400) Temp src: Axillary (04/11 0400) BP: 117/83 mmHg (04/11 0600) Pulse Rate: 147  (04/11 0600) Intake/Output from previous day: 04/10 0701 - 04/11 0700 In: 2107.6 [P.O.:120; I.V.:1691.6; IV Piggyback:126] Out: 760 [Urine:600; Drains:160] Intake/Output from this shift: Total I/O In: -  Out: 70 [Drains:70]  Labs:  [facility] 07/01/11 0548 06/30/11 0503 06/29/11 0446  WBC 19.4* 21.1* 18.5*  HGB 9.8* 10.6* 12.0  HCT 30.2* 32.2* 36.5  PLT 437* 411* 535*  APTT -- -- --  INR -- -- --    Basename 07/01/11 0601 07/01/11 0548 06/30/11 0503 06/29/11 0446  NA -- 133* 136 134*  K -- 3.6 3.6 4.1  CL -- 101 100 98  CO2 -- 25 28 26   GLUCOSE -- 158* 119* 142*  BUN -- 15 15 7   CREATININE -- 0.47* 0.59 0.53  LABCREA -- -- -- --  CREAT24HRUR -- -- -- --  CALCIUM -- 8.1* 8.6 8.7  MG -- 1.8 1.5 1.5  PHOS -- 2.3 2.3 4.0  PROT -- 5.0* 5.2* 5.8*  ALBUMIN -- 2.0* 2.3* 2.7*  AST -- 34 25 57*  ALT -- 12 13 23   ALKPHOS -- 82 80 101  BILITOT -- 1.0 0.5 0.4  BILIDIR -- -- -- --  IBILI -- -- -- --  PREALBUMIN -- -- -- --  TRIG 75 -- -- --  CHOLHDL -- -- -- --  CHOL 82 -- -- --   Estimated Creatinine Clearance: 75.8 ml/min (by C-G formula based on Cr of 0.47).   No results found for this basename: GLUCAP:3 in the last 72 hours  Medical History: Past Medical History  Diagnosis Date  . RA (rheumatoid arthritis)  2008  . MI (myocardial infarction) 2008    Not well documented,  patient reports reassuring cardiac catheterization and stress testing in [place]  . Constipation 03/21/2011  . Hypokalemia 03/21/2011  . Fatty liver 03/21/2011  . Pancreatitis     states 3 years ago, very severe, ?biliary pancreatitis   . T12 compression fracture 2011  . Duodenal ulcer     remote per patient. +BC powders, patient report negative EGD six months ago  . Dilated pancreatic duct     ?chronic pancreatitis, EUS pending (06/02/11)   Medications:  Scheduled:     . adalimumab  40 mg Subcutaneous Q7 days  . enoxaparin  40 mg Subcutaneous Q24H  . fluconazole (DIFLUCAN) IV  200 mg Intravenous Q24H  . HYDROmorphone PCA 0.3 mg/mL   Intravenous Q4H  . HYDROmorphone PCA 0.3 mg/mL      . HYDROmorphone PCA 0.3 mg/mL      . insulin aspart  0-9 Units Subcutaneous Q4H  . LORazepam  1 mg Intravenous Q6H  . ondansetron  4 mg Intravenous Once  . ondansetron (ZOFRAN) IV  4 mg Intravenous TID AC  . pantoprazole (PROTONIX) IV  40 mg Intravenous BID AC  . sodium chloride  10-40 mL Intracatheter Q12H  . sodium chloride      .  DISCONTD: HYDROmorphone PCA 0.3 mg/mL   Intravenous Q4H    Insulin Requirements in the past 24 hours:  n/a  Current Nutrition:  TPN at 10ml/hr  Assessment: Renal fxn OK Lytes OK except Na down slightly Calcium corrects to WNL due to low albumin hypoalbuminemia  Nutritional Goals:  ~1900-2000 non-protein kCal, 110-130 grams of protein per day  Plan: Advance TPN to 75ml/hr today Adjust IVF's accordingly MVI, trace elements, and Lipids every Mon-Wed-Fri due to shortages Labs per protocol Monitor lytes, fluid status, glucose tolerance, renal fxn  Margo Aye, Aurorah Schlachter A 07/01/2011,11:31 AM

## 2011-07-01 NOTE — Progress Notes (Signed)
3 Days Post-Op  Subjective: Pain somewhat better but still present.  No nausea.  +fever/chills.  Objective: Vital signs in last 24 hours: Temp:  [98.2 F (36.8 C)-102.2 F (39 C)] 98.2 F (36.8 C) (04/11 2000) Pulse Rate:  [120-150] 125  (04/11 2100) Resp:  [21-40] 27  (04/11 2100) BP: (107-136)/(66-91) 116/70 mmHg (04/11 2100) SpO2:  [93 %-97 %] 96 % (04/11 2100) FiO2 (%):  [33 %] 33 % (04/11 1600) Weight:  [69 kg (152 lb 1.9 oz)] 69 kg (152 lb 1.9 oz) (04/11 0500) Last BM Date: 06/27/11  Intake/Output from previous day: 04/10 0701 - 04/11 0700 In: 2107.6 [P.O.:120; I.V.:1691.6; IV Piggyback:126] Out: 760 [Urine:600; Drains:160] Intake/Output this shift: Total I/O In: 50 [I.V.:50] Out: -   General appearance: no distress and resting.  Arousable. Resp: clear to auscultation bilaterally Cardio: tachycardic, regular rhythm. GI: Quiet.  Soft, flat.  Moderate/severe RUQ pain.  No diffuse peritoneal signs.  Incision c/d/i.  JP red-brown.  in bulb.  Lab Results:   [facility] 07/01/11 0548 06/30/11 0503  WBC 19.4* 21.1*  HGB 9.8* 10.6*  HCT 30.2* 32.2*  PLT 437* 411*   BMET  Basename 07/01/11 0548 06/30/11 0503  NA 133* 136  K 3.6 3.6  CL 101 100  CO2 25 28  GLUCOSE 158* 119*  BUN 15 15  CREATININE 0.47* 0.59  CALCIUM 8.1* 8.6   PT/INR No results found for this basename: LABPROT:2,INR:2 in the last 72 hours ABG No results found for this basename: PHART:2,PCO2:2,PO2:2,HCO3:2 in the last 72 hours  Studies/Results: Dg Chest Port 1 View  06/30/2011  *RADIOLOGY REPORT*  Clinical Data: Confirm line placement.  PORTABLE CHEST - 1 VIEW  Comparison: None.  Findings: Right PICC line is in place with the tip at the cavoatrial junction.  NG tube is seen in the stomach.  Bibasilar atelectasis.  Heart is normal size.  No effusions.  IMPRESSION: Bibasilar atelectasis.  Right PICC line tip at the cavoatrial junction.  Original Report Authenticated By: Cyndie Chime, M.D.     Anti-infectives: Anti-infectives     Start     Dose/Rate Route Frequency Ordered Stop   06/30/11 0900   fluconazole (DIFLUCAN) IVPB 200 mg        200 mg 100 mL/hr over 60 Minutes Intravenous Every 24 hours 06/30/11 0807     06/29/11 1300   ertapenem (INVANZ) 1 g in sodium chloride 0.9 % 50 mL IVPB        1 g 100 mL/hr over 30 Minutes Intravenous Every 24 hours 06/28/11 2007 06/29/11 1416   06/28/11 1330   ertapenem (INVANZ) 1 g in sodium chloride 0.9 % 50 mL IVPB  Status:  Discontinued        1 g 100 mL/hr over 30 Minutes Intravenous Every 24 hours 06/28/11 1315 06/28/11 1840   06/28/11 1309   sodium chloride 0.9 % with ertapenem [facility]) ADS Med  Status:  Discontinued     Comments: BELL, ANITRA: cabinet override         06/28/11 1309 06/28/11 1310          Assessment/Plan: s/p Procedure(s) (LRB): ANTRECTOMY (N/A) GASTROJEJUNOSTOMY (N/A) Pulm: continue cough, deep breath, IS useage.  CVS:  stable, tachy likely pain related.  GI:  continue NG.  WIll check UGI tomorrow to check anast.  GU:  adaquate.  Continue to monitor UOP. Will d/c catheter in AM per patient's request.  ID:  continue abx.  Febrile episode, WBC up but down  slightly from yesterday.  FEN:  COntinue TPN.  General:  will place in step-down.  If continues to remain stable to floor tomorrow.    LOS: 11 days    Tonda Wiederhold C 07/01/2011

## 2011-07-02 ENCOUNTER — Inpatient Hospital Stay (HOSPITAL_COMMUNITY)

## 2011-07-02 LAB — GLUCOSE, CAPILLARY
Glucose-Capillary: 102 mg/dL — ABNORMAL HIGH (ref 70–99)
Glucose-Capillary: 105 mg/dL — ABNORMAL HIGH (ref 70–99)
Glucose-Capillary: 95 mg/dL (ref 70–99)
Glucose-Capillary: 97 mg/dL (ref 70–99)
Glucose-Capillary: 99 mg/dL (ref 70–99)

## 2011-07-02 LAB — CBC
HCT: 22.7 % — ABNORMAL LOW (ref 36.0–46.0)
Hemoglobin: 7.3 g/dL — ABNORMAL LOW (ref 12.0–15.0)
MCV: 82.8 fL (ref 78.0–100.0)
WBC: 13.2 10*3/uL — ABNORMAL HIGH (ref 4.0–10.5)

## 2011-07-02 LAB — COMPREHENSIVE METABOLIC PANEL
ALT: 13 U/L (ref 0–35)
AST: 32 U/L (ref 0–37)
Albumin: 1.7 g/dL — ABNORMAL LOW (ref 3.5–5.2)
CO2: 28 mEq/L (ref 19–32)
Calcium: 7.8 mg/dL — ABNORMAL LOW (ref 8.4–10.5)
Chloride: 101 mEq/L (ref 96–112)
Creatinine, Ser: 0.47 mg/dL — ABNORMAL LOW (ref 0.50–1.10)
Sodium: 133 mEq/L — ABNORMAL LOW (ref 135–145)
Total Bilirubin: 0.6 mg/dL (ref 0.3–1.2)

## 2011-07-02 LAB — PHOSPHORUS: Phosphorus: 1.6 mg/dL — ABNORMAL LOW (ref 2.3–4.6)

## 2011-07-02 LAB — PREPARE RBC (CROSSMATCH)

## 2011-07-02 MED ORDER — FAT EMULSION 20 % IV EMUL
250.0000 mL | INTRAVENOUS | Status: AC
  Administered 2011-07-02: 250 mL via INTRAVENOUS
  Filled 2011-07-02: qty 250

## 2011-07-02 MED ORDER — HYDROMORPHONE 0.3 MG/ML IV SOLN
INTRAVENOUS | Status: AC
  Administered 2011-07-02: 16:00:00
  Filled 2011-07-02: qty 25

## 2011-07-02 MED ORDER — HYDROMORPHONE HCL PF 1 MG/ML IJ SOLN
INTRAMUSCULAR | Status: AC
  Filled 2011-07-02: qty 1

## 2011-07-02 MED ORDER — FUROSEMIDE 10 MG/ML IJ SOLN
20.0000 mg | Freq: Once | INTRAMUSCULAR | Status: AC
  Administered 2011-07-03: 20 mg via INTRAVENOUS
  Filled 2011-07-02: qty 2

## 2011-07-02 MED ORDER — TRACE MINERALS CR-CU-MN-SE-ZN 10-1000-500-60 MCG/ML IV SOLN
INTRAVENOUS | Status: AC
  Administered 2011-07-02: 19:00:00 via INTRAVENOUS
  Filled 2011-07-02: qty 2000

## 2011-07-02 MED ORDER — SODIUM CHLORIDE 0.9 % IJ SOLN
INTRAMUSCULAR | Status: AC
  Administered 2011-07-02: 10 mL
  Filled 2011-07-02: qty 3

## 2011-07-02 MED ORDER — POTASSIUM PHOSPHATE DIBASIC 3 MMOLE/ML IV SOLN
10.0000 mmol | Freq: Once | INTRAVENOUS | Status: AC
  Administered 2011-07-02: 10 mmol via INTRAVENOUS
  Filled 2011-07-02: qty 3.33

## 2011-07-02 MED ORDER — HYDROMORPHONE HCL PF 1 MG/ML IJ SOLN
1.0000 mg | Freq: Once | INTRAMUSCULAR | Status: AC
  Administered 2011-07-02: 1 mg via INTRAMUSCULAR

## 2011-07-02 MED ORDER — POTASSIUM CHLORIDE 10 MEQ/100ML IV SOLN
10.0000 meq | INTRAVENOUS | Status: AC
Start: 2011-07-02 — End: 2011-07-02
  Filled 2011-07-02: qty 100

## 2011-07-02 NOTE — Progress Notes (Signed)
Spoke with Dr. Leticia Penna; no new orders were given; will continue to monitor pt

## 2011-07-02 NOTE — Consult Note (Signed)
PARENTERAL NUTRITION CONSULT NOTE   Pharmacy Consult for TPN Indication: s/p antrectomy, anticipated ileus, delay in starting enteral feeding  Allergies  Allergen Reactions  . Compazine Shortness Of Breath    Tongue swelling  . Phenergan Shortness Of Breath    Tongue swelling  . Vistaril (Hyzine) Shortness Of Breath    Tongue swelling  . Latex Swelling   Patient Measurements: Height: 5\' 4"  (162.6 cm) Weight: 156 lb 4.9 oz (70.9 kg) IBW/kg (Calculated) : 54.7   Vital Signs: Temp: 101.6 F (38.7 C) (04/12 0400) Temp src: Oral (04/12 0400) BP: 101/60 mmHg (04/12 0600) Pulse Rate: 131  (04/12 0600) Intake/Output from previous day: 04/11 0701 - 04/12 0700 In: 3138.9 [I.V.:900; IV Piggyback:106; TPN:2132.9] Out: 1420 [Urine:650; Drains:770] Intake/Output from this shift:    Labs:  [facility] 07/02/11 0458 07/01/11 0548 06/30/11 0503  WBC 13.2* 19.4* 21.1*  HGB 7.3* 9.8* 10.6*  HCT 22.7* 30.2* 32.2*  PLT 287 437* 411*  APTT -- -- --  INR -- -- --    Basename 07/02/11 0458 07/01/11 0601 07/01/11 0548 06/30/11 0503  NA 133* -- 133* 136  K 3.4* -- 3.6 3.6  CL 101 -- 101 100  CO2 28 -- 25 28  GLUCOSE 115* -- 158* 119*  BUN 15 -- 15 15  CREATININE 0.47* -- 0.47* 0.59  LABCREA -- -- -- --  CREAT24HRUR -- -- -- --  CALCIUM 7.8* -- 8.1* 8.6  MG 1.8 -- 1.8 1.5  PHOS 1.6* -- 2.3 2.3  PROT 4.7* -- 5.0* 5.2*  ALBUMIN 1.7* -- 2.0* 2.3*  AST 32 -- 34 25  ALT 13 -- 12 13  ALKPHOS 56 -- 82 80  BILITOT 0.6 -- 1.0 0.5  BILIDIR -- -- -- --  IBILI -- -- -- --  PREALBUMIN -- -- 5.0* --  TRIG -- 75 -- --  CHOLHDL -- -- -- --  CHOL -- 82 -- --   Estimated Creatinine Clearance: 76.8 ml/min (by C-G formula based on Cr of 0.47).    Basename 07/02/11 0416 07/02/11 0010 07/01/11 1959  GLUCAP 95 99 122*    Medical History: Past Medical History  Diagnosis Date  . RA (rheumatoid arthritis)  2008  . MI (myocardial infarction) 2008    Not well documented, patient reports  reassuring cardiac catheterization and stress testing in [place]  . Constipation 03/21/2011  . Hypokalemia 03/21/2011  . Fatty liver 03/21/2011  . Pancreatitis     states 3 years ago, very severe, ?biliary pancreatitis   . T12 compression fracture 2011  . Duodenal ulcer     remote per patient. +BC powders, patient report negative EGD six months ago  . Dilated pancreatic duct     ?chronic pancreatitis, EUS pending (06/02/11)   Medications:  Scheduled:     . adalimumab  40 mg Subcutaneous Q7 days  . enoxaparin  40 mg Subcutaneous Q24H  . fluconazole (DIFLUCAN) IV  200 mg Intravenous Q24H  . HYDROmorphone PCA 0.3 mg/mL   Intravenous Q4H  . HYDROmorphone PCA 0.3 mg/mL      . insulin aspart  0-9 Units Subcutaneous Q4H  . LORazepam  1 mg Intravenous Q6H  . ondansetron  4 mg Intravenous Once  . ondansetron (ZOFRAN) IV  4 mg Intravenous TID AC  . pantoprazole (PROTONIX) IV  40 mg Intravenous BID AC  . sodium chloride  10-40 mL Intracatheter Q12H    Insulin Requirements in the past 24 hours:  n/a  Current Nutrition:  TPN at  60ml/hr  Assessment: Estimated Creatinine Clearance: 76.8 ml/min (by C-G formula based on Cr of 0.47). Hypokalemia Hypophosphatemia Hyponatremia Calcium corrects to WNL due to low albumin hypoalbuminemia  Nutritional Goals per RD: Estimated Nutritional Needs:  Kcal:1725-2070 kcal per day  Protein:104-117 grams per day  Fluid:1 ml per kcal  Plan: KCL 10 mEq IV run x 3. KPhos (approx 0.15 mMol/kg) IV over six hours. TPN to 53ml/hr today @ goal rate. Adjust IVF's accordingly MVI, trace elements, and Lipids every Mon-Wed-Fri due to shortages Labs per protocol Monitor lytes, fluid status, glucose tolerance, renal fxn  Jordan Haney 07/02/2011,8:09 AM

## 2011-07-02 NOTE — Progress Notes (Signed)
Called Dr. Leticia Penna to notify of 350 ml of bloody drainage from right JP since 1712. Message relayed via Lucrezia Europe, RN in the OR. Will continue to monitor drainage for now. Order received for IM Dilaudid.

## 2011-07-02 NOTE — Progress Notes (Signed)
Report given to [facility] of the ICU.  Pt transferred to the ICCU step down unit at this time.

## 2011-07-02 NOTE — Progress Notes (Signed)
Paged MD for pt's fever; pt's temp was 101.6; will continue to monitor pt

## 2011-07-03 ENCOUNTER — Inpatient Hospital Stay (HOSPITAL_COMMUNITY)

## 2011-07-03 LAB — CBC
MCH: 28.6 pg (ref 26.0–34.0)
MCV: 83.8 fL (ref 78.0–100.0)
Platelets: 245 10*3/uL (ref 150–400)
RBC: 3.7 MIL/uL — ABNORMAL LOW (ref 3.87–5.11)
RDW: 15.1 % (ref 11.5–15.5)

## 2011-07-03 LAB — COMPREHENSIVE METABOLIC PANEL
ALT: 44 U/L — ABNORMAL HIGH (ref 0–35)
Alkaline Phosphatase: 96 U/L (ref 39–117)
BUN: 9 mg/dL (ref 6–23)
CO2: 28 mEq/L (ref 19–32)
Calcium: 8.1 mg/dL — ABNORMAL LOW (ref 8.4–10.5)
GFR calc Af Amer: >90 mL/min (ref >90)
GFR calc non Af Amer: >90 mL/min (ref >90)
Glucose, Bld: 114 mg/dL — ABNORMAL HIGH (ref 70–99)
Potassium: 3.6 mEq/L (ref 3.5–5.1)
Total Protein: 5.1 g/dL — ABNORMAL LOW (ref 6.0–8.3)

## 2011-07-03 LAB — GLUCOSE, CAPILLARY
Glucose-Capillary: 104 mg/dL — ABNORMAL HIGH (ref 70–99)
Glucose-Capillary: 117 mg/dL — ABNORMAL HIGH (ref 70–99)

## 2011-07-03 LAB — MAGNESIUM: Magnesium: 1.9 mg/dL (ref 1.5–2.5)

## 2011-07-03 MED ORDER — HYDROMORPHONE 0.3 MG/ML IV SOLN
INTRAVENOUS | Status: AC
  Filled 2011-07-03: qty 25

## 2011-07-03 MED ORDER — METOPROLOL TARTRATE 1 MG/ML IV SOLN
5.0000 mg | Freq: Three times a day (TID) | INTRAVENOUS | Status: DC
  Administered 2011-07-03 – 2011-07-10 (×22): 5 mg via INTRAVENOUS
  Filled 2011-07-03 (×21): qty 5

## 2011-07-03 MED ORDER — CLINIMIX E/DEXTROSE (4.25/5) 4.25 % IV SOLN
INTRAVENOUS | Status: AC
  Administered 2011-07-03: 21:00:00 via INTRAVENOUS
  Filled 2011-07-03: qty 2000

## 2011-07-03 NOTE — Progress Notes (Signed)
5 Days Post-Op  Subjective: Pain reported slightly better. No nausea overnight with tube clamped. She has had subjective fevers and chills. No BM.  Objective: Vital signs in last 24 hours: Temp:  [98.2 F (36.8 C)-101.4 F (38.6 C)] 101.1 F (38.4 C) (04/13 0800) Pulse Rate:  [122-139] 125  (04/13 0630) Resp:  [18-32] 24  (04/13 0800) BP: (115-168)/(78-110) 124/93 mmHg (04/13 0615) SpO2:  [92 %-96 %] 94 % (04/13 0853) Weight:  [68.221 kg (150 lb 6.4 oz)] 68.221 kg (150 lb 6.4 oz) (04/13 0425) Last BM Date: 06/27/11  Intake/Output from previous day: 04/12 0701 - 04/13 0700 In: 4669.7 [I.V.:1350; Blood:1055; NG/GT:100; IV Piggyback:353.3; TPN:1811.3] Out: 3080 [Urine:2225; Emesis/NG output:100; Drains:755] Intake/Output this shift:    General appearance: alert and no distress Eyes: Pupils equal round reactive extraocular movements are intact no scleral icterus is noted. Resp: clear to auscultation bilaterally Cardio: Tachycardia but regular rhythm GI: quiet, flat, moderate to severe right upper quadrant abdominal tenderness. No diffuse peritoneal signs. Incision is clean dry and intact. JP is red-brown and in coloration but appears to be mostly a watery sanguinous discharge. Decreased output since yesterday.  Lab Results:   Basename 07/03/11 0722 07/02/11 0959 07/02/11 0458  WBC 15.5* -- 13.2*  HGB 10.6* 7.3* --  HCT 31.0* 22.4* --  PLT 245 -- 287   BMET  Basename 07/03/11 0722 07/02/11 0458  NA 130* 133*  K 3.6 3.4*  CL 95* 101  CO2 28 28  GLUCOSE 114* 115*  BUN 9 15  CREATININE 0.36* 0.47*  CALCIUM 8.1* 7.8*   PT/INR No results found for this basename: LABPROT:2,INR:2 in the last 72 hours ABG No results found for this basename: PHART:2,PCO2:2,PO2:2,HCO3:2 in the last 72 hours  Studies/Results: Dg Ugi W/water Sol Cm  07/02/2011  *RADIOLOGY REPORT*  Clinical Data:  Status post antrectomy, gastrojejunostomy and Roux- en-Y.  Evaluate gastrojejunostomy anastomosis.   UPPER GI SERIES WITHOUT KUB  Technique:  Routine upper GI series was performed with water- soluble contrast.  Fluoroscopy Time: 2.8 minutes  Comparison:  Plain films 06/20/2011  Findings: 150 ml of Omnipaque-300 was injected through the patient's indwelling NG tube.  The tip of the catheter is within the small bowel just beyond the gastrojejunostomy anastomosis.  No contrast extravasation noted at the gastrojejunostomy.  Very slow passage of contrast into the small bowel.  The patient was left in the semiupright position for approximately 10-15 minutes with only a small amount of contrast passing into the proximal jejunum.  IMPRESSION: Gastrojejunostomy is patent without evidence of extravasation. Very slow emptying of contrast from the stomach.  Original Report Authenticated By: Cyndie Chime, M.D.    Anti-infectives: Anti-infectives     Start     Dose/Rate Route Frequency Ordered Stop   06/30/11 0900   fluconazole (DIFLUCAN) IVPB 200 mg        200 mg 100 mL/hr over 60 Minutes Intravenous Every 24 hours 06/30/11 0807     06/29/11 1300   ertapenem (INVANZ) 1 g in sodium chloride 0.9 % 50 mL IVPB        1 g 100 mL/hr over 30 Minutes Intravenous Every 24 hours 06/28/11 2007 06/29/11 1416   06/28/11 1330   ertapenem (INVANZ) 1 g in sodium chloride 0.9 % 50 mL IVPB  Status:  Discontinued        1 g 100 mL/hr over 30 Minutes Intravenous Every 24 hours 06/28/11 1315 06/28/11 1840   06/28/11 1309   sodium chloride 0.9 %  with ertapenem [facility]) ADS Med  Status:  Discontinued     Comments: BELL, ANITRA: cabinet override         06/28/11 1309 06/28/11 1310          Assessment/Plan: s/p Procedure(s) (LRB): ANTRECTOMY (N/A) GASTROJEJUNOSTOMY (N/A) Pulmonary: Continued cough deep breathing and incentive spirometry. Cardiovascular: Has responded appropriately to blood transfusions. I still feel this is acute and chronic in nature and will continue to monitor. Low suspicion of ongoing active  bleeding although due to the amount of inflammation in the right upper quadrant I would suspect some using within this area. Gastrointestinal: Gastric jejunostomy anastomosis looks good on upper GI and without nodules go ahead and remove nasogastric tube at this time. Nasogastric tube should not be replaced for future nausea due to the recent anastomosis. I have a high suspicion that the duodenum ostomy over the common bile duct repair is likely leaking but appears controlled at this time through the JP drain. Continue to await for increased bowel function. Gen. to urinary: Good urine output. Long discussion with the patient regarding removal of the Foley catheter and we'll proceed with discontinuation today. Infectious disease: Continue antibiotics coverage. Due to persistent febrile spikes as well as elevation in WBC count I will obtain blood cultures urine cultures and a chest x-ray today to further investigate other sources of potential infectious nidus. Drainage from the right upper quadrant could be a contributing issue. FEN: Continue TPN and lipids. Gen. continue step down monitoring. Continue pain control via PCA.  LOS: 13 days    Jiya Kissinger C 07/03/2011

## 2011-07-03 NOTE — Consult Note (Signed)
PARENTERAL NUTRITION CONSULT NOTE   Pharmacy Consult for TPN Indication: s/p antrectomy, anticipated ileus, delay in starting enteral feeding  Allergies  Allergen Reactions  . Compazine Shortness Of Breath    Tongue swelling  . Phenergan Shortness Of Breath    Tongue swelling  . Vistaril (Hyzine) Shortness Of Breath    Tongue swelling  . Latex Swelling   Patient Measurements: Height: 5\' 4"  (162.6 cm) Weight: 150 lb 6.4 oz (68.221 kg) IBW/kg (Calculated) : 54.7   Vital Signs: Temp: 101.1 F (38.4 C) (04/13 0800) Temp src: Oral (04/13 0800) BP: 124/93 mmHg (04/13 0615) Pulse Rate: 125  (04/13 0630) Intake/Output from previous day: 04/12 0701 - 04/13 0700 In: 4669.7 [I.V.:1350; Blood:1055; NG/GT:100; IV Piggyback:353.3; TPN:1811.3] Out: 3080 [Urine:2225; Emesis/NG output:100; Drains:755] Intake/Output from this shift:    Labs:  [facility] 07/03/11 0722 07/02/11 0959 07/02/11 0458 07/01/11 0548  WBC 15.5* -- 13.2* 19.4*  HGB 10.6* 7.3* 7.3* --  HCT 31.0* 22.4* 22.7* --  PLT 245 -- 287 437*  APTT -- -- -- --  INR -- -- -- --    Basename 07/03/11 0722 07/02/11 0458 07/01/11 0601 07/01/11 0548  NA 130* 133* -- 133*  K 3.6 3.4* -- 3.6  CL 95* 101 -- 101  CO2 28 28 -- 25  GLUCOSE 114* 115* -- 158*  BUN 9 15 -- 15  CREATININE 0.36* 0.47* -- 0.47*  LABCREA -- -- -- --  CREAT24HRUR -- -- -- --  CALCIUM 8.1* 7.8* -- 8.1*  MG 1.9 1.8 -- 1.8  PHOS 2.5 1.6* -- 2.3  PROT 5.1* 4.7* -- 5.0*  ALBUMIN 1.8* 1.7* -- 2.0*  AST 129* 32 -- 34  ALT 44* 13 -- 12  ALKPHOS 96 56 -- 82  BILITOT 4.5* 0.6 -- 1.0  BILIDIR -- -- -- --  IBILI -- -- -- --  PREALBUMIN -- -- -- 5.0*  TRIG -- -- 75 --  CHOLHDL -- -- -- --  CHOL -- -- 82 --   Estimated Creatinine Clearance: 75.4 ml/min (by C-G formula based on Cr of 0.36).    Basename 07/03/11 0759 07/03/11 0421 07/03/11  GLUCAP 106* 118* 117*    Medical History: Past Medical History  Diagnosis Date  . RA (rheumatoid arthritis)   2008  . MI (myocardial infarction) 2008    Not well documented, patient reports reassuring cardiac catheterization and stress testing in [place]  . Constipation 03/21/2011  . Hypokalemia 03/21/2011  . Fatty liver 03/21/2011  . Pancreatitis     states 3 years ago, very severe, ?biliary pancreatitis   . T12 compression fracture 2011  . Duodenal ulcer     remote per patient. +BC powders, patient report negative EGD six months ago  . Dilated pancreatic duct     ?chronic pancreatitis, EUS pending (06/02/11)   Medications:  Scheduled:     . adalimumab  40 mg Subcutaneous Q7 days  . enoxaparin  40 mg Subcutaneous Q24H  . fluconazole (DIFLUCAN) IV  200 mg Intravenous Q24H  . furosemide  20 mg Intravenous Once  . HYDROmorphone      .  HYDROmorphone (DILAUDID) injection  1 mg Intramuscular Once  . HYDROmorphone PCA 0.3 mg/mL   Intravenous Q4H  . HYDROmorphone PCA 0.3 mg/mL      . HYDROmorphone PCA 0.3 mg/mL      . insulin aspart  0-9 Units Subcutaneous Q4H  . LORazepam  1 mg Intravenous Q6H  . metoprolol  5 mg Intravenous Q8H  .  ondansetron  4 mg Intravenous Once  . ondansetron (ZOFRAN) IV  4 mg Intravenous TID AC  . pantoprazole (PROTONIX) IV  40 mg Intravenous BID AC  . potassium chloride  10 mEq Intravenous Q1 Hr x 3  . potassium phosphate IVPB (mmol)  10 mmol Intravenous Once  . sodium chloride  10-40 mL Intracatheter Q12H  . sodium chloride      . sodium chloride      . sodium chloride        Insulin Requirements in the past 24 hours:  n/a  Current Nutrition:  TPN at 14ml/hr  Assessment: Estimated Creatinine Clearance: 75.4 ml/min (by C-G formula based on Cr of 0.36). Hyponatremia Calcium corrects to WNL due to low albumin Hypoalbuminemia  Increased AST/ALT & T.Bili.  Nutritional Goals per RD: Estimated Nutritional Needs:  Kcal:1725-2070 kcal per day  Protein:104-117 grams per day  Fluid:1 ml per kcal  Plan: TPN @ 65ml/hr today @ goal rate. MVI, trace  elements, and Lipids every Mon-Wed-Fri due to shortages Labs per protocol Monitor lytes, fluid status, glucose tolerance, renal fxn Repeat CMET in AM to evaluate LFTs.  Mady Gemma 07/03/2011,9:22 AM

## 2011-07-03 NOTE — Progress Notes (Signed)
Pt unable to void since urinary catheter removed at 1100. Straight cathed at 1700, with 300cc dark amber urine resulting.

## 2011-07-03 NOTE — Progress Notes (Signed)
NGT and foley removed at 1100. Pt tolerated well.

## 2011-07-04 LAB — COMPREHENSIVE METABOLIC PANEL
ALT: 56 U/L — ABNORMAL HIGH (ref 0–35)
AST: 122 U/L — ABNORMAL HIGH (ref 0–37)
CO2: 25 mEq/L (ref 19–32)
Calcium: 8.1 mg/dL — ABNORMAL LOW (ref 8.4–10.5)
Chloride: 94 mEq/L — ABNORMAL LOW (ref 96–112)
GFR calc non Af Amer: >90 mL/min (ref >90)
Sodium: 128 mEq/L — ABNORMAL LOW (ref 135–145)

## 2011-07-04 LAB — TYPE AND SCREEN
ABO/RH(D): O POS
Antibody Screen: NEGATIVE
Unit division: 0
Unit division: 0

## 2011-07-04 LAB — CBC
HCT: 31.9 % — ABNORMAL LOW (ref 36.0–46.0)
Hemoglobin: 10.8 g/dL — ABNORMAL LOW (ref 12.0–15.0)
MCV: 83.5 fL (ref 78.0–100.0)
RDW: 15.9 % — ABNORMAL HIGH (ref 11.5–15.5)
WBC: 20.7 10*3/uL — ABNORMAL HIGH (ref 4.0–10.5)

## 2011-07-04 LAB — GLUCOSE, CAPILLARY
Glucose-Capillary: 119 mg/dL — ABNORMAL HIGH (ref 70–99)
Glucose-Capillary: 106 mg/dL — ABNORMAL HIGH (ref 70–99)

## 2011-07-04 LAB — URINE CULTURE: Colony Count: NO GROWTH

## 2011-07-04 MED ORDER — HYDROMORPHONE 0.3 MG/ML IV SOLN
INTRAVENOUS | Status: AC
  Filled 2011-07-04: qty 25

## 2011-07-04 MED ORDER — LORAZEPAM 2 MG/ML IJ SOLN
1.0000 mg | Freq: Four times a day (QID) | INTRAMUSCULAR | Status: DC | PRN
Start: 2011-07-04 — End: 2011-07-06

## 2011-07-04 MED ORDER — CLINIMIX E/DEXTROSE (4.25/5) 4.25 % IV SOLN
INTRAVENOUS | Status: AC
  Administered 2011-07-04: 18:00:00 via INTRAVENOUS
  Filled 2011-07-04: qty 2000

## 2011-07-04 MED ORDER — HYDROMORPHONE 0.3 MG/ML IV SOLN
INTRAVENOUS | Status: AC
  Administered 2011-07-04: 0.3 mg
  Filled 2011-07-04: qty 25

## 2011-07-04 MED ORDER — SODIUM CHLORIDE 0.9 % IV BOLUS (SEPSIS)
500.0000 mL | Freq: Once | INTRAVENOUS | Status: AC
  Administered 2011-07-04: 500 mL via INTRAVENOUS

## 2011-07-04 NOTE — Progress Notes (Signed)
6 Days Post-Op  Subjective: Pain slightly better. Describes it more as a pressure sensation now. Slight nausea but improving. She denies any subjective fevers or chills overnight. No new complaints.  Objective: Vital signs in last 24 hours: Temp:  [99.5 F (37.5 Haney)-100.9 F (38.3 Haney)] 100.9 F (38.3 Haney) (04/14 0400) Pulse Rate:  [95-136] 105  (04/14 1100) Resp:  [16-29] 21  (04/14 1100) BP: (107-127)/(65-87) 113/65 mmHg (04/14 1100) SpO2:  [94 %-99 %] 98 % (04/14 1100) Last BM Date: 06/24/11  Intake/Output from previous day: 04/13 0701 - 04/14 0700 In: 2585 [IV Piggyback:110; ZOX:0960] Out: 1495 [Urine:930; Drains:565] Intake/Output this shift:    General appearance: alert and no distress Resp: clear to auscultation bilaterally Cardio: Tachycardia but regular rhythm. GI: Quiet bowel sounds, soft, flat, moderate right upper quadrant abdominal tenderness. No diffuse peritoneal signs. Midline incision is clean dry and intact. JP drain demonstrates bilious drainage. Extremities: extremities normal, atraumatic, no cyanosis or edema  Lab Results:   Basename 07/04/11 0424 07/03/11 0722  WBC 20.7* 15.5*  HGB 10.8* 10.6*  HCT 31.9* 31.0*  PLT 267 245   BMET  Basename 07/04/11 0424 07/03/11 0722  NA 128* 130*  K 3.9 3.6  CL 94* 95*  CO2 25 28  GLUCOSE 113* 114*  BUN 11 9  CREATININE 0.42* 0.36*  CALCIUM 8.1* 8.1*   PT/INR No results found for this basename: LABPROT:2,INR:2 in the last 72 hours ABG No results found for this basename: PHART:2,PCO2:2,PO2:2,HCO3:2 in the last 72 hours  Studies/Results: Dg Chest Port 1 View  07/03/2011  *RADIOLOGY REPORT*  Clinical Data: Persistent fever and leukocytosis status post recent abdominal surgery.  PORTABLE CHEST - 1 VIEW  Comparison: 06/30/2011.  Findings: 1024 hours.  Nasogastric tube projects below the diaphragm.  Right arm PICC is unchanged near the cavoatrial junction.  There are lower lung volumes with increasing pleural  effusions and bibasilar air space opacities.  No pneumothorax is seen.  The heart size and mediastinal contours are stable.  IMPRESSION: Worsening pleural effusions and bibasilar air space opacities.  Original Report Authenticated By: Gerrianne Scale, M.D.    Anti-infectives: Anti-infectives     Start     Dose/Rate Route Frequency Ordered Stop   06/30/11 0900   fluconazole (DIFLUCAN) IVPB 200 mg        200 mg 100 mL/hr over 60 Minutes Intravenous Every 24 hours 06/30/11 0807     06/29/11 1300   ertapenem (INVANZ) 1 g in sodium chloride 0.9 % 50 mL IVPB        1 g 100 mL/hr over 30 Minutes Intravenous Every 24 hours 06/28/11 2007 06/29/11 1416   06/28/11 1330   ertapenem (INVANZ) 1 g in sodium chloride 0.9 % 50 mL IVPB  Status:  Discontinued        1 g 100 mL/hr over 30 Minutes Intravenous Every 24 hours 06/28/11 1315 06/28/11 1840   06/28/11 1309   sodium chloride 0.9 % with ertapenem [facility]) ADS Med  Status:  Discontinued     Comments: BELL, ANITRA: cabinet override         06/28/11 1309 06/28/11 1310          Assessment/Plan: s/p Procedure(s) (LRB): ANTRECTOMY (N/A) GASTROJEJUNOSTOMY (N/A) Pulmonary: Continue incentive spirometry. Cardiovascular: Tachycardia has improved although still persist. I still feel this is most likely pain related. Gastrointestinal: Continue to await for increased bowel function. Patient is tolerating discontinuation a nasogastric tube. Still having drainage from the JP suspicious for a  bilious leak from the repair site. JP drain anticipated stay in position for a long time. GU: Patient did have discontinuation of Foley catheter yesterday but did not tolerate and had urinary retention requiring replacement back with the Foley catheter. This will be continued for now. Infectious disease: Continues to have elevated WBC count. Cultures are pending. Continue broad-spectrum antibiotics. No febrile episode overnight. If cultures are negative and continues to  have elevated white count we'll consider checking Haney. difficile. FEN: Continue TPN and nutritional support. General: Continue step down monitoring.  LOS: 14 days    Jordan Haney 07/04/2011

## 2011-07-04 NOTE — Consult Note (Signed)
PARENTERAL NUTRITION CONSULT NOTE   Pharmacy Consult for TPN Indication: s/p antrectomy, anticipated ileus, delay in starting enteral feeding  Allergies  Allergen Reactions  . Compazine Shortness Of Breath    Tongue swelling  . Phenergan Shortness Of Breath    Tongue swelling  . Vistaril (Hyzine) Shortness Of Breath    Tongue swelling  . Latex Swelling   Patient Measurements: Height: 5\' 4"  (162.6 cm) Weight: 150 lb 6.4 oz (68.221 kg) IBW/kg (Calculated) : 54.7   Vital Signs: Temp: 100.9 F (38.3 C) (04/14 0400) Temp src: Oral (04/14 0400) BP: 114/77 mmHg (04/14 0400) Pulse Rate: 116  (04/14 0400) Intake/Output from previous day: 04/13 0701 - 04/14 0700 In: 2585 [IV Piggyback:110; TPN:2175] Out: 1495 [Urine:930; Drains:565] Intake/Output from this shift:    Labs:  [facility] 07/04/11 0424 07/03/11 0722 07/02/11 0959 07/02/11 0458  WBC 20.7* 15.5* -- 13.2*  HGB 10.8* 10.6* 7.3* --  HCT 31.9* 31.0* 22.4* --  PLT 267 245 -- 287  APTT -- -- -- --  INR -- -- -- --    Basename 07/04/11 0424 07/03/11 0722 07/02/11 0458  NA 128* 130* 133*  K 3.9 3.6 3.4*  CL 94* 95* 101  CO2 25 28 28   GLUCOSE 113* 114* 115*  BUN 11 9 15   CREATININE 0.42* 0.36* 0.47*  LABCREA -- -- --  CREAT24HRUR -- -- --  CALCIUM 8.1* 8.1* 7.8*  MG 1.9 1.9 1.8  PHOS 2.8 2.5 1.6*  PROT 5.1* 5.1* 4.7*  ALBUMIN 1.6* 1.8* 1.7*  AST 122* 129* 32  ALT 56* 44* 13  ALKPHOS 177* 96 56  BILITOT 4.6* 4.5* 0.6  BILIDIR -- -- --  IBILI -- -- --  PREALBUMIN -- -- --  TRIG -- -- --  CHOLHDL -- -- --  CHOL -- -- --   Estimated Creatinine Clearance: 75.4 ml/min (by C-G formula based on Cr of 0.42).    Basename 07/04/11 0756 07/04/11 0407 07/04/11 0052  GLUCAP 109* 117* 119*    Medical History: Past Medical History  Diagnosis Date  . RA (rheumatoid arthritis)  2008  . MI (myocardial infarction) 2008    Not well documented, patient reports reassuring cardiac catheterization and stress testing in  [place]  . Constipation 03/21/2011  . Hypokalemia 03/21/2011  . Fatty liver 03/21/2011  . Pancreatitis     states 3 years ago, very severe, ?biliary pancreatitis   . T12 compression fracture 2011  . Duodenal ulcer     remote per patient. +BC powders, patient report negative EGD six months ago  . Dilated pancreatic duct     ?chronic pancreatitis, EUS pending (06/02/11)   Medications:  Scheduled:     . adalimumab  40 mg Subcutaneous Q7 days  . enoxaparin  40 mg Subcutaneous Q24H  . fluconazole (DIFLUCAN) IV  200 mg Intravenous Q24H  . HYDROmorphone PCA 0.3 mg/mL   Intravenous Q4H  . HYDROmorphone PCA 0.3 mg/mL      . HYDROmorphone PCA 0.3 mg/mL      . insulin aspart  0-9 Units Subcutaneous Q4H  . LORazepam  1 mg Intravenous Q6H  . metoprolol  5 mg Intravenous Q8H  . ondansetron  4 mg Intravenous Once  . ondansetron (ZOFRAN) IV  4 mg Intravenous TID AC  . pantoprazole (PROTONIX) IV  40 mg Intravenous BID AC  . sodium chloride  10-40 mL Intracatheter Q12H    Insulin Requirements in the past 24 hours:  n/a  Current Nutrition:  TPN at 47ml/hr  Assessment: Estimated Creatinine Clearance: 75.4 ml/min (by C-G formula based on Cr of 0.42). Hyponatremia Calcium corrects to WNL due to low albumin Hypoalbuminemia  Increased AST/ALT, Alk Phos & T.Bili.  Nutritional Goals per RD: Estimated Nutritional Needs:  Kcal:1725-2070 kcal per day  Protein:104-117 grams per day  Fluid:1 ml per kcal  Plan: TPN @ 25ml/hr today @ goal rate. MVI, trace elements, and Lipids every Mon-Wed-Fri due to shortages Labs per protocol Monitor lytes, fluid status, glucose tolerance, renal fxn Repeat CMET in AM to evaluate LFTs.  Mady Gemma 07/04/2011,10:54 AM

## 2011-07-05 LAB — CBC
HCT: 28.1 % — ABNORMAL LOW (ref 36.0–46.0)
MCHC: 33.1 g/dL (ref 30.0–36.0)
RDW: 16.5 % — ABNORMAL HIGH (ref 11.5–15.5)
WBC: 17.3 10*3/uL — ABNORMAL HIGH (ref 4.0–10.5)

## 2011-07-05 LAB — DIFFERENTIAL
Basophils Absolute: 0.1 10*3/uL (ref 0.0–0.1)
Basophils Relative: 1 % (ref 0–1)
Lymphocytes Relative: 9 % — ABNORMAL LOW (ref 12–46)
Neutro Abs: 13.8 10*3/uL — ABNORMAL HIGH (ref 1.7–7.7)
Neutrophils Relative %: 80 % — ABNORMAL HIGH (ref 43–77)

## 2011-07-05 LAB — TRIGLYCERIDES: Triglycerides: 176 mg/dL — ABNORMAL HIGH (ref <150)

## 2011-07-05 LAB — COMPREHENSIVE METABOLIC PANEL
AST: 161 U/L — ABNORMAL HIGH (ref 0–37)
Albumin: 1.6 g/dL — ABNORMAL LOW (ref 3.5–5.2)
BUN: 10 mg/dL (ref 6–23)
Calcium: 8.1 mg/dL — ABNORMAL LOW (ref 8.4–10.5)
Chloride: 98 mEq/L (ref 96–112)
Creatinine, Ser: 0.37 mg/dL — ABNORMAL LOW (ref 0.50–1.10)
GFR calc non Af Amer: >90 mL/min (ref >90)
Total Bilirubin: 4.6 mg/dL — ABNORMAL HIGH (ref 0.3–1.2)

## 2011-07-05 LAB — PHOSPHORUS: Phosphorus: 3 mg/dL (ref 2.3–4.6)

## 2011-07-05 LAB — MAGNESIUM: Magnesium: 1.8 mg/dL (ref 1.5–2.5)

## 2011-07-05 LAB — GLUCOSE, CAPILLARY
Glucose-Capillary: 101 mg/dL — ABNORMAL HIGH (ref 70–99)
Glucose-Capillary: 102 mg/dL — ABNORMAL HIGH (ref 70–99)
Glucose-Capillary: 103 mg/dL — ABNORMAL HIGH (ref 70–99)
Glucose-Capillary: 107 mg/dL — ABNORMAL HIGH (ref 70–99)
Glucose-Capillary: 95 mg/dL (ref 70–99)

## 2011-07-05 LAB — CHOLESTEROL, TOTAL: Cholesterol: 84 mg/dL (ref 0–200)

## 2011-07-05 LAB — PREALBUMIN: Prealbumin: 2.9 mg/dL — ABNORMAL LOW (ref 17.0–34.0)

## 2011-07-05 MED ORDER — NALOXONE HCL 0.4 MG/ML IJ SOLN: 0.4000 mg | INTRAMUSCULAR | Status: DC | PRN

## 2011-07-05 MED ORDER — DIPHENHYDRAMINE HCL 50 MG/ML IJ SOLN: 12.5000 mg | Freq: Four times a day (QID) | INTRAMUSCULAR | Status: DC | PRN

## 2011-07-05 MED ORDER — HYDROMORPHONE 0.3 MG/ML IV SOLN
INTRAVENOUS | Status: DC
  Administered 2011-07-05: 7.65 mg via INTRAVENOUS
  Administered 2011-07-05: 15:00:00 via INTRAVENOUS
  Administered 2011-07-06: 6.3 mg via INTRAVENOUS
  Administered 2011-07-06: 4.01 mg via INTRAVENOUS
  Administered 2011-07-06: 3.6 mg via INTRAVENOUS
  Administered 2011-07-06: 5.34 mg via INTRAVENOUS
  Administered 2011-07-06: 0.6 mg via INTRAVENOUS
  Administered 2011-07-06: 0.3 mg via INTRAVENOUS
  Administered 2011-07-07: 1.8 mg via INTRAVENOUS
  Administered 2011-07-07: 14:00:00 via INTRAVENOUS
  Administered 2011-07-07: 2.7 mg via INTRAVENOUS
  Administered 2011-07-07: 0.3 mg via INTRAVENOUS
  Administered 2011-07-07: 1.2 mg via INTRAVENOUS
  Administered 2011-07-07: 3.3 mg via INTRAVENOUS
  Administered 2011-07-08: 2.4 mg via INTRAVENOUS
  Administered 2011-07-08: 0.6 mg via INTRAVENOUS
  Administered 2011-07-08: 0.3 mg via INTRAVENOUS
  Administered 2011-07-09: 5.7 mg via INTRAVENOUS
  Administered 2011-07-09: 6.3 mg via INTRAVENOUS
  Administered 2011-07-09: 18:00:00 via INTRAVENOUS
  Administered 2011-07-09: 3.9 mg via INTRAVENOUS
  Administered 2011-07-09: 2.7 mg via INTRAVENOUS
  Administered 2011-07-09: 3.14 mg via INTRAVENOUS
  Administered 2011-07-09: 2.4 mg via INTRAVENOUS
  Administered 2011-07-10: 1.8 mg via INTRAVENOUS
  Administered 2011-07-10: 0.1 mg via INTRAVENOUS
  Administered 2011-07-10 – 2011-07-11 (×2): via INTRAVENOUS
  Administered 2011-07-11: 2.4 mg via INTRAVENOUS
  Administered 2011-07-11: 4.5 mg via INTRAVENOUS
  Administered 2011-07-12: 2.1 mg via INTRAVENOUS
  Administered 2011-07-12: 06:00:00 via INTRAVENOUS

## 2011-07-05 MED ORDER — ONDANSETRON HCL 4 MG/2ML IJ SOLN: 4.0000 mg | Freq: Four times a day (QID) | INTRAMUSCULAR | Status: DC | PRN

## 2011-07-05 MED ORDER — SODIUM CHLORIDE 0.9 % IJ SOLN
9.0000 mL | INTRAMUSCULAR | Status: DC | PRN
  Filled 2011-07-05: qty 3

## 2011-07-05 MED ORDER — HYDROMORPHONE 0.3 MG/ML IV SOLN
INTRAVENOUS | Status: AC
  Administered 2011-07-05: 0.3 mg
  Filled 2011-07-05: qty 25

## 2011-07-05 MED ORDER — DIPHENHYDRAMINE HCL 12.5 MG/5ML PO ELIX
12.5000 mg | ORAL_SOLUTION | Freq: Four times a day (QID) | ORAL | Status: DC | PRN
  Filled 2011-07-05: qty 5

## 2011-07-05 MED ORDER — HYDROMORPHONE 0.3 MG/ML IV SOLN
INTRAVENOUS | Status: AC
  Filled 2011-07-05: qty 25

## 2011-07-05 MED ORDER — M.V.I. ADULT IV INJ
INJECTION | INTRAVENOUS | Status: AC
  Administered 2011-07-05: 18:00:00 via INTRAVENOUS
  Filled 2011-07-05: qty 2000

## 2011-07-05 MED ORDER — HYDROMORPHONE HCL PF 1 MG/ML IJ SOLN
1.0000 mg | INTRAMUSCULAR | Status: DC | PRN
  Administered 2011-07-05 – 2011-07-15 (×35): 1 mg via INTRAVENOUS
  Filled 2011-07-05 (×36): qty 1

## 2011-07-05 MED ORDER — HYDROMORPHONE 0.3 MG/ML IV SOLN
INTRAVENOUS | Status: AC
  Administered 2011-07-05: 23:00:00
  Filled 2011-07-05: qty 25

## 2011-07-05 NOTE — Consult Note (Signed)
PARENTERAL NUTRITION CONSULT NOTE   Pharmacy Consult for TPN Indication: s/p antrectomy, anticipated ileus, delay in starting enteral feeding  Allergies  Allergen Reactions  . Compazine Shortness Of Breath    Tongue swelling  . Phenergan Shortness Of Breath    Tongue swelling  . Vistaril (Hyzine) Shortness Of Breath    Tongue swelling  . Latex Swelling   Patient Measurements: Height: 5\' 4"  (162.6 cm) Weight: 157 lb 3 oz (71.3 kg) IBW/kg (Calculated) : 54.7   Vital Signs: Temp: 99.2 F (37.3 C) (04/15 0400) Temp src: Oral (04/15 0400) BP: 118/78 mmHg (04/15 0000) Pulse Rate: 108  (04/15 0000) Intake/Output from previous day: 04/14 0701 - 04/15 0700 In: 1969 [IV Piggyback:104; TPN:1725] Out: 2870 [Urine:2150; Drains:720] Intake/Output from this shift:    Labs:  [facility] 07/05/11 0423 07/04/11 0424 07/03/11 0722  WBC 17.3* 20.7* 15.5*  HGB 9.3* 10.8* 10.6*  HCT 28.1* 31.9* 31.0*  PLT 260 267 245  APTT -- -- --  INR -- -- --    Basename 07/05/11 0424 07/05/11 0423 07/04/11 0424 07/03/11 0722  NA -- 132* 128* 130*  K -- 3.9 3.9 3.6  CL -- 98 94* 95*  CO2 -- 26 25 28   GLUCOSE -- 109* 113* 114*  BUN -- 10 11 9   CREATININE -- 0.37* 0.42* 0.36*  LABCREA -- -- -- --  CREAT24HRUR -- -- -- --  CALCIUM -- 8.1* 8.1* 8.1*  MG -- 1.8 1.9 1.9  PHOS -- 3.0 2.8 2.5  PROT -- 5.2* 5.1* 5.1*  ALBUMIN -- 1.6* 1.6* 1.8*  AST -- 161* 122* 129*  ALT -- 77* 56* 44*  ALKPHOS -- 145* 177* 96  BILITOT -- 4.6* 4.6* 4.5*  BILIDIR -- -- -- --  IBILI -- -- -- --  PREALBUMIN -- -- -- --  TRIG 176* -- -- --  CHOLHDL -- -- -- --  CHOL 84 -- -- --   Estimated Creatinine Clearance: 76.9 ml/min (by C-G formula based on Cr of 0.37).    Basename 07/05/11 0748 07/05/11 0400 07/05/11 0029  GLUCAP 107* 95 101*    Medical History: Past Medical History  Diagnosis Date  . RA (rheumatoid arthritis)  2008  . MI (myocardial infarction) 2008    Not well documented, patient reports  reassuring cardiac catheterization and stress testing in [place]  . Constipation 03/21/2011  . Hypokalemia 03/21/2011  . Fatty liver 03/21/2011  . Pancreatitis     states 3 years ago, very severe, ?biliary pancreatitis   . T12 compression fracture 2011  . Duodenal ulcer     remote per patient. +BC powders, patient report negative EGD six months ago  . Dilated pancreatic duct     ?chronic pancreatitis, EUS pending (06/02/11)   Medications:  Scheduled:     . adalimumab  40 mg Subcutaneous Q7 days  . enoxaparin  40 mg Subcutaneous Q24H  . fluconazole (DIFLUCAN) IV  200 mg Intravenous Q24H  . HYDROmorphone PCA 0.3 mg/mL   Intravenous Q4H  . HYDROmorphone PCA 0.3 mg/mL      . HYDROmorphone PCA 0.3 mg/mL      . HYDROmorphone PCA 0.3 mg/mL      . insulin aspart  0-9 Units Subcutaneous Q4H  . metoprolol  5 mg Intravenous Q8H  . ondansetron  4 mg Intravenous Once  . ondansetron (ZOFRAN) IV  4 mg Intravenous TID AC  . pantoprazole (PROTONIX) IV  40 mg Intravenous BID AC  . sodium chloride  500 mL Intravenous  Once  . sodium chloride  10-40 mL Intracatheter Q12H  . DISCONTD: LORazepam  1 mg Intravenous Q6H    Insulin Requirements in the past 24 hours:  n/a  Current Nutrition:  TPN at 26ml/hr  Assessment: Estimated Creatinine Clearance: 76.9 ml/min (by C-G formula based on Cr of 0.37). Hyponatremia (improving) Calcium corrects to WNL due to low albumin Hypoalbuminemia  Increased AST/ALT, Alk Phos, Triglycerides & T.Bili.  Prealbumin pending.  Nutritional Goals per RD: Estimated Nutritional Needs:  Kcal:1725-2070 kcal per day  Protein:104-117 grams per day  Fluid:1 ml per kcal  Plan: TPN @ 68ml/hr today @ goal rate. No lipids or trace element today due to LFTs (discussed with Dr. Lovell Sheehan) Labs per protocol Monitor lytes, fluid status, glucose tolerance, renal fxn Repeat CMET in AM to evaluate LFTs.  Humira will be stopped while in hospital (discussed with Dr.  Lovell Sheehan).  Mady Gemma 07/05/2011,9:04 AM

## 2011-07-05 NOTE — Progress Notes (Signed)
7 Days Post-Op  Subjective: Having upper abdominal pain.  Objective: Vital signs in last 24 hours: Temp:  [98.5 F (36.9 C)-99.3 F (37.4 C)] 99.2 F (37.3 C) (04/15 0400) Pulse Rate:  [97-113] 108  (04/15 0000) Resp:  [16-28] 26  (04/15 0734) BP: (98-131)/(60-88) 118/78 mmHg (04/15 0000) SpO2:  [94 %-98 %] 96 % (04/15 0734) Weight:  [71.3 kg (157 lb 3 oz)] 71.3 kg (157 lb 3 oz) (04/15 0500) Last BM Date: 07/05/11  Intake/Output from previous day: 04/14 0701 - 04/15 0700 In: 1969 [IV Piggyback:104; TPN:1725] Out: 2870 [Urine:2150; Drains:720] Intake/Output this shift:    General appearance: alert and mild distress Resp: clear to auscultation bilaterally Cardio: regular rate and rhythm, S1, S2 normal, no murmur, click, rub or gallop GI: Soft, with some tenderness in the upper abdomen around the incision line. Incision healing well. No rigidity noted  Lab Results:   Basename 07/05/11 0423 07/04/11 0424  WBC 17.3* 20.7*  HGB 9.3* 10.8*  HCT 28.1* 31.9*  PLT 260 267   BMET  Basename 07/05/11 0423 07/04/11 0424  NA 132* 128*  K 3.9 3.9  CL 98 94*  CO2 26 25  GLUCOSE 109* 113*  BUN 10 11  CREATININE 0.37* 0.42*  CALCIUM 8.1* 8.1*   PT/INR No results found for this basename: LABPROT:2,INR:2 in the last 72 hours  Studies/Results: Dg Chest Port 1 View  07/03/2011  *RADIOLOGY REPORT*  Clinical Data: Persistent fever and leukocytosis status post recent abdominal surgery.  PORTABLE CHEST - 1 VIEW  Comparison: 06/30/2011.  Findings: 1024 hours.  Nasogastric tube projects below the diaphragm.  Right arm PICC is unchanged near the cavoatrial junction.  There are lower lung volumes with increasing pleural effusions and bibasilar air space opacities.  No pneumothorax is seen.  The heart size and mediastinal contours are stable.  IMPRESSION: Worsening pleural effusions and bibasilar air space opacities.  Original Report Authenticated By: Gerrianne Scale, M.D.     Anti-infectives: Anti-infectives     Start     Dose/Rate Route Frequency Ordered Stop   06/30/11 0900   fluconazole (DIFLUCAN) IVPB 200 mg        200 mg 100 mL/hr over 60 Minutes Intravenous Every 24 hours 06/30/11 0807     06/29/11 1300   ertapenem (INVANZ) 1 g in sodium chloride 0.9 % 50 mL IVPB        1 g 100 mL/hr over 30 Minutes Intravenous Every 24 hours 06/28/11 2007 06/29/11 1416   06/28/11 1330   ertapenem (INVANZ) 1 g in sodium chloride 0.9 % 50 mL IVPB  Status:  Discontinued        1 g 100 mL/hr over 30 Minutes Intravenous Every 24 hours 06/28/11 1315 06/28/11 1840   06/28/11 1309   sodium chloride 0.9 % with ertapenem [facility]) ADS Med  Status:  Discontinued     Comments: BELL, ANITRA: cabinet override         06/28/11 1309 06/28/11 1310          Assessment/Plan: s/p Procedure(s): ANTRECTOMY GASTROJEJUNOSTOMY Impression: Swelling a prolonged recovery from her surgery. She appears to have a leak around the common bile duct repair, though it's well controlled with the Jackson-Pratt drain is present. Her liver enzyme tests are starting to increase, so we will stop her lipids. I have started the Dilaudid IV for breakthrough pain. Her leukocytosis is starting to resolve. Urine cultures negative. Blood cultures are still pending.  LOS: 15 days  Franky Macho A 07/05/2011

## 2011-07-06 ENCOUNTER — Other Ambulatory Visit (HOSPITAL_COMMUNITY)

## 2011-07-06 ENCOUNTER — Inpatient Hospital Stay (HOSPITAL_COMMUNITY)

## 2011-07-06 LAB — GLUCOSE, CAPILLARY
Glucose-Capillary: 108 mg/dL — ABNORMAL HIGH (ref 70–99)
Glucose-Capillary: 101 mg/dL — ABNORMAL HIGH (ref 70–99)
Glucose-Capillary: 105 mg/dL — ABNORMAL HIGH (ref 70–99)
Glucose-Capillary: 116 mg/dL — ABNORMAL HIGH (ref 70–99)

## 2011-07-06 LAB — COMPREHENSIVE METABOLIC PANEL
ALT: 48 U/L — ABNORMAL HIGH (ref 0–35)
AST: 70 U/L — ABNORMAL HIGH (ref 0–37)
Albumin: 1.6 g/dL — ABNORMAL LOW (ref 3.5–5.2)
Alkaline Phosphatase: 186 U/L — ABNORMAL HIGH (ref 39–117)
CO2: 28 mEq/L (ref 19–32)
Chloride: 96 mEq/L (ref 96–112)
GFR calc non Af Amer: >90 mL/min (ref >90)
Potassium: 3.9 mEq/L (ref 3.5–5.1)
Sodium: 132 mEq/L — ABNORMAL LOW (ref 135–145)
Total Bilirubin: 3 mg/dL — ABNORMAL HIGH (ref 0.3–1.2)

## 2011-07-06 LAB — CBC
HCT: 27.6 % — ABNORMAL LOW (ref 36.0–46.0)
Hemoglobin: 9.1 g/dL — ABNORMAL LOW (ref 12.0–15.0)
MCHC: 33 g/dL (ref 30.0–36.0)
MCV: 85.4 fL (ref 78.0–100.0)
RDW: 17 % — ABNORMAL HIGH (ref 11.5–15.5)

## 2011-07-06 MED ORDER — HYDROMORPHONE 0.3 MG/ML IV SOLN
INTRAVENOUS | Status: AC
  Filled 2011-07-06: qty 25

## 2011-07-06 MED ORDER — CLINIMIX E/DEXTROSE (4.25/5) 4.25 % IV SOLN
INTRAVENOUS | Status: AC
  Administered 2011-07-06: 19:00:00 via INTRAVENOUS
  Filled 2011-07-06: qty 2000

## 2011-07-06 MED ORDER — IOHEXOL 300 MG/ML  SOLN
100.0000 mL | Freq: Once | INTRAMUSCULAR | Status: AC | PRN
  Administered 2011-07-06: 100 mL via INTRAVENOUS

## 2011-07-06 MED ORDER — HYDROMORPHONE 0.3 MG/ML IV SOLN
INTRAVENOUS | Status: AC
  Administered 2011-07-06: 12:00:00
  Administered 2011-07-06: 0.3 mg
  Filled 2011-07-06: qty 25

## 2011-07-06 MED ORDER — CHLORHEXIDINE GLUCONATE 0.12 % MT SOLN
15.0000 mL | Freq: Two times a day (BID) | OROMUCOSAL | Status: DC
  Administered 2011-07-06 – 2011-07-15 (×13): 15 mL via OROMUCOSAL
  Filled 2011-07-06 (×14): qty 15

## 2011-07-06 MED ORDER — HYDROMORPHONE 0.3 MG/ML IV SOLN
INTRAVENOUS | Status: AC
  Administered 2011-07-06: 12:00:00
  Filled 2011-07-06: qty 25

## 2011-07-06 MED ORDER — BISACODYL 10 MG RE SUPP
10.0000 mg | Freq: Two times a day (BID) | RECTAL | Status: DC
  Administered 2011-07-06: 10 mg via RECTAL
  Filled 2011-07-06 (×2): qty 1

## 2011-07-06 MED ORDER — LORAZEPAM 2 MG/ML IJ SOLN
1.0000 mg | INTRAMUSCULAR | Status: DC | PRN
  Administered 2011-07-06 – 2011-07-11 (×12): 1 mg via INTRAVENOUS
  Filled 2011-07-06 (×12): qty 1

## 2011-07-06 MED ORDER — PIPERACILLIN-TAZOBACTAM 3.375 G IVPB
3.3750 g | Freq: Three times a day (TID) | INTRAVENOUS | Status: DC
  Administered 2011-07-06 – 2011-07-15 (×27): 3.375 g via INTRAVENOUS
  Filled 2011-07-06 (×37): qty 50

## 2011-07-06 NOTE — Consult Note (Signed)
PARENTERAL NUTRITION CONSULT NOTE   Pharmacy Consult for TPN Indication: s/p antrectomy, anticipated ileus, delay in starting enteral feeding  Allergies  Allergen Reactions  . Compazine Shortness Of Breath    Tongue swelling  . Phenergan Shortness Of Breath    Tongue swelling  . Vistaril (Hyzine) Shortness Of Breath    Tongue swelling  . Latex Swelling   Patient Measurements: Height: 5\' 4"  (162.6 cm) Weight: 149 lb 11.1 oz (67.9 kg) IBW/kg (Calculated) : 54.7   Vital Signs: Temp: 98.7 F (37.1 C) (04/16 1200) Temp src: Oral (04/16 1200) BP: 145/93 mmHg (04/16 1200) Pulse Rate: 106  (04/16 1200) Intake/Output from previous day: 04/15 0701 - 04/16 0700 In: 1690 [I.V.:650; TPN:900] Out: 2850 [Urine:2300; Drains:550] Intake/Output from this shift: Total I/O In: 390 [Other:190; IV Piggyback:200] Out: 400 [Urine:400]  Labs:  [facility] 07/06/11 0500 07/05/11 0423 07/04/11 0424  WBC 22.9* 17.3* 20.7*  HGB 9.1* 9.3* 10.8*  HCT 27.6* 28.1* 31.9*  PLT 284 260 267  APTT -- -- --  INR -- -- --    Basename 07/06/11 0500 07/05/11 0424 07/05/11 0423 07/04/11 0424  NA 132* -- 132* 128*  K 3.9 -- 3.9 3.9  CL 96 -- 98 94*  CO2 28 -- 26 25  GLUCOSE 104* -- 109* 113*  BUN 9 -- 10 11  CREATININE 0.39* -- 0.37* 0.42*  LABCREA -- -- -- --  CREAT24HRUR -- -- -- --  CALCIUM 8.5 -- 8.1* 8.1*  MG 2.0 -- 1.8 1.9  PHOS 3.7 -- 3.0 2.8  PROT 5.2* -- 5.2* 5.1*  ALBUMIN 1.6* -- 1.6* 1.6*  AST 70* -- 161* 122*  ALT 48* -- 77* 56*  ALKPHOS 186* -- 145* 177*  BILITOT 3.0* -- 4.6* 4.6*  BILIDIR -- -- -- --  IBILI -- -- -- --  PREALBUMIN -- -- 2.9* --  TRIG 147 176* -- --  CHOLHDL -- -- -- --  CHOL -- 84 -- --   Estimated Creatinine Clearance: 75.3 ml/min (by C-G formula based on Cr of 0.39).    Basename 07/06/11 1131 07/06/11 0747 07/06/11 0415  GLUCAP 101* 100* 108*   Medical History: Past Medical History  Diagnosis Date  . RA (rheumatoid arthritis)  2008  . MI (myocardial  infarction) 2008    Not well documented, patient reports reassuring cardiac catheterization and stress testing in [place]  . Constipation 03/21/2011  . Hypokalemia 03/21/2011  . Fatty liver 03/21/2011  . Pancreatitis     states 3 years ago, very severe, ?biliary pancreatitis   . T12 compression fracture 2011  . Duodenal ulcer     remote per patient. +BC powders, patient report negative EGD six months ago  . Dilated pancreatic duct     ?chronic pancreatitis, EUS pending (06/02/11)   Medications:  Scheduled:     . chlorhexidine  15 mL Mouth/Throat BID  . enoxaparin  40 mg Subcutaneous Q24H  . fluconazole (DIFLUCAN) IV  200 mg Intravenous Q24H  . HYDROmorphone PCA 0.3 mg/mL   Intravenous Q4H  . HYDROmorphone PCA 0.3 mg/mL      . HYDROmorphone PCA 0.3 mg/mL      . HYDROmorphone PCA 0.3 mg/mL      . insulin aspart  0-9 Units Subcutaneous Q4H  . metoprolol  5 mg Intravenous Q8H  . ondansetron  4 mg Intravenous Once  . ondansetron (ZOFRAN) IV  4 mg Intravenous TID AC  . pantoprazole (PROTONIX) IV  40 mg Intravenous BID AC  . piperacillin-tazobactam (  ZOSYN)  IV  3.375 g Intravenous Q8H  . sodium chloride  10-40 mL Intracatheter Q12H   Insulin Requirements in the past 24 hours:  n/a  Current Nutrition:  TPN at 41ml/hr  Assessment: Estimated Creatinine Clearance: 75.3 ml/min (by C-G formula based on Cr of 0.39). Hyponatremia (improving) Calcium corrects to WNL due to low albumin Hypoalbuminemia  Increased AST/ALT, Alk Phos, Triglycerides & T.Bili., improving since off Lipids  Nutritional Goals per RD: Estimated Nutritional Needs:  Kcal:1725-2070 kcal per day  Protein:104-117 grams per day  Fluid:1 ml per kcal  Plan: TPN @ 61ml/hr today @ goal rate. No lipids or trace element today due to LFTs (discussed with Dr. Lovell Sheehan) Labs per protocol Monitor lytes, fluid status, glucose tolerance, renal fxn Repeat CMET in AM to evaluate LFTs.  Humira will be stopped while in  hospital (discussed with Dr. Lovell Sheehan).  Margo Aye, Tamiyah Moulin A 07/06/2011,1:50 PM

## 2011-07-06 NOTE — Progress Notes (Signed)
8 Days Post-Op  Subjective: Still with abdominal pain that is unchanged. States the Tylenol that for breakthrough pain is helpful.  Objective: Vital signs in last 24 hours: Temp:  [97.2 F (36.2 C)-101.9 F (38.8 C)] 99 F (37.2 C) (04/16 0400) Pulse Rate:  [25-124] 103  (04/16 0500) Resp:  [15-26] 24  (04/16 0600) BP: (110-143)/(71-110) 118/80 mmHg (04/16 0500) SpO2:  [83 %-100 %] 100 % (04/16 0600) FiO2 (%):  [33 %-36 %] 36 % (04/16 0400) Weight:  [67.9 kg (149 lb 11.1 oz)] 67.9 kg (149 lb 11.1 oz) (04/16 0500) Last BM Date: 07/01/11  Intake/Output from previous day: 04/15 0701 - 04/16 0700 In: 1690 [I.V.:650; TPN:900] Out: 2850 [Urine:2300; Drains:550] Intake/Output this shift:    General appearance: alert, cooperative and mild distress Resp: clear to auscultation bilaterally Cardio: regular rate and rhythm, S1, S2 normal, no murmur, click, rub or gallop GI: Abdomen soft with nonspecific tenderness noted. No rigidity is noted. Incisions are healing well. Jackson-Pratt drainage is bilious in nature.  Lab Results:   Basename 07/06/11 0500 07/05/11 0423  WBC 22.9* 17.3*  HGB 9.1* 9.3*  HCT 27.6* 28.1*  PLT 284 260   BMET  Basename 07/06/11 0500 07/05/11 0423  NA 132* 132*  K 3.9 3.9  CL 96 98  CO2 28 26  GLUCOSE 104* 109*  BUN 9 10  CREATININE 0.39* 0.37*  CALCIUM 8.5 8.1*   PT/INR No results found for this basename: LABPROT:2,INR:2 in the last 72 hours  Studies/Results: No results found.  Anti-infectives: Anti-infectives     Start     Dose/Rate Route Frequency Ordered Stop   07/06/11 0800  piperacillin-tazobactam (ZOSYN) IVPB 3.375 g       3.375 g 12.5 mL/hr over 240 Minutes Intravenous 3 times per day 07/06/11 0759     06/30/11 0900   fluconazole (DIFLUCAN) IVPB 200 mg        200 mg 100 mL/hr over 60 Minutes Intravenous Every 24 hours 06/30/11 0807     06/29/11 1300   ertapenem (INVANZ) 1 g in sodium chloride 0.9 % 50 mL IVPB        1 g 100  mL/hr over 30 Minutes Intravenous Every 24 hours 06/28/11 2007 06/29/11 1416   06/28/11 1330   ertapenem (INVANZ) 1 g in sodium chloride 0.9 % 50 mL IVPB  Status:  Discontinued        1 g 100 mL/hr over 30 Minutes Intravenous Every 24 hours 06/28/11 1315 06/28/11 1840   06/28/11 1309   sodium chloride 0.9 % with ertapenem [facility]) ADS Med  Status:  Discontinued     Comments: BELL, ANITRA: cabinet override         06/28/11 1309 06/28/11 1310          Assessment/Plan: s/p Procedure(s): ANTRECTOMY GASTROJEJUNOSTOMY Impression: Leukocytosis has increased since yesterday. Will do CT scan of the abdomen and pelvis to rule out an intra-abdominal abscess. I have added back Zosyn along with her Diflucan. Her LFTs have improved by taking the lipids out of her TPN. Further management is pending those results.  LOS: 16 days    Eddie Koc A 07/06/2011

## 2011-07-06 NOTE — Progress Notes (Signed)
Nutrition Follow-up  Pt cont to complain of abdominal pain. CT of abdomen and pelvis ordered.   She is receiving TPN with Clinimix E 5/15 @ 75 ml/hr and lipids held currently due to LFT's. Provides 1278 kcal and 90 grams protein per day. Meets 74% minimum estimated energy needs and 87% minimum estimated protein needs.  Additional IVF with lactated ringers @ 25 ml/hr  Diet Order:  NPO  Meds: Scheduled Meds:   . chlorhexidine  15 mL Mouth/Throat BID  . enoxaparin  40 mg Subcutaneous Q24H  . fluconazole (DIFLUCAN) IV  200 mg Intravenous Q24H  . HYDROmorphone PCA 0.3 mg/mL   Intravenous Q4H  . HYDROmorphone PCA 0.3 mg/mL      . HYDROmorphone PCA 0.3 mg/mL      . HYDROmorphone PCA 0.3 mg/mL      . insulin aspart  0-9 Units Subcutaneous Q4H  . metoprolol  5 mg Intravenous Q8H  . ondansetron (ZOFRAN) IV  4 mg Intravenous TID AC  . pantoprazole (PROTONIX) IV  40 mg Intravenous BID AC  . piperacillin-tazobactam (ZOSYN)  IV  3.375 g Intravenous Q8H  . sodium chloride  10-40 mL Intracatheter Q12H  . DISCONTD: ondansetron  4 mg Intravenous Once   Continuous Infusions:   . lactated ringers 25 mL/hr at 07/05/11 1500  . TPN (CLINIMIX) +/- additives 75 mL/hr at 07/05/11 0600  . TPN (CLINIMIX) +/- additives    . TPN (CLINIMIX) +/- additives 75 mL/hr at 07/05/11 1820   PRN Meds:.acetaminophen, diphenhydrAMINE, diphenhydrAMINE, diphenhydrAMINE, diphenhydrAMINE, HYDROmorphone (DILAUDID) injection, iohexol, LORazepam, naloxone, naloxone, ondansetron (ZOFRAN) IV, ondansetron (ZOFRAN) IV, phenol, sodium chloride, sodium chloride, sodium chloride  Labs:  CMP     Component Value Date/Time   NA 132* 07/06/2011 0500   K 3.9 07/06/2011 0500   CL 96 07/06/2011 0500   CO2 28 07/06/2011 0500   GLUCOSE 104* 07/06/2011 0500   BUN 9 07/06/2011 0500   CREATININE 0.39* 07/06/2011 0500   CALCIUM 8.5 07/06/2011 0500   PROT 5.2* 07/06/2011 0500   ALBUMIN 1.6* 07/06/2011 0500   AST 70* 07/06/2011 0500   ALT 48*  07/06/2011 0500   ALKPHOS 186* 07/06/2011 0500   BILITOT 3.0* 07/06/2011 0500   GFRNONAA >90 07/06/2011 0500   GFRAA >90 07/06/2011 0500     Intake/Output Summary (Last 24 hours) at 07/06/11 1437 Last data filed at 07/06/11 1427  Gross per 24 hour  Intake   2200 ml  Output   2680 ml  Net   -480 ml    Weight Status:  149.11# (68 kg), wt on 07/01/11 152# (69#)   Estimated Nutritional Needs:  Kcal:1725-2070 kcal per day  Protein:104-117 grams per day  Fluid:1 ml per kcal  NUTRITION DIAGNOSIS:  -Inadequate oral intake (NI-2.1). Status: Ongoing   RELATED TO: bowel rest   AS EVIDENCE BY: NPO, s/p antrectomy and gastrojejunostomy   MONITORING/EVALUATION(Goals):  -TPN tol, weight status and labs  -GOC: Pt will meet >80% of est nutritional needs.  INTERVENTION:  -RD to monitor for nutritional needs    Dietitian [phone]  Francene Boyers

## 2011-07-07 LAB — COMPREHENSIVE METABOLIC PANEL
ALT: 39 U/L — ABNORMAL HIGH (ref 0–35)
Alkaline Phosphatase: 129 U/L — ABNORMAL HIGH (ref 39–117)
BUN: 11 mg/dL (ref 6–23)
CO2: 26 mEq/L (ref 19–32)
Calcium: 8.5 mg/dL (ref 8.4–10.5)
GFR calc Af Amer: >90 mL/min (ref >90)
GFR calc non Af Amer: >90 mL/min (ref >90)
Glucose, Bld: 126 mg/dL — ABNORMAL HIGH (ref 70–99)
Potassium: 3.6 mEq/L (ref 3.5–5.1)
Sodium: 131 mEq/L — ABNORMAL LOW (ref 135–145)
Total Protein: 5.8 g/dL — ABNORMAL LOW (ref 6.0–8.3)

## 2011-07-07 LAB — CBC
MCH: 28.6 pg (ref 26.0–34.0)
MCV: 84.7 fL (ref 78.0–100.0)
Platelets: 406 10*3/uL — ABNORMAL HIGH (ref 150–400)
RBC: 3.39 MIL/uL — ABNORMAL LOW (ref 3.87–5.11)
RDW: 17.1 % — ABNORMAL HIGH (ref 11.5–15.5)

## 2011-07-07 LAB — GLUCOSE, CAPILLARY
Glucose-Capillary: 116 mg/dL — ABNORMAL HIGH (ref 70–99)
Glucose-Capillary: 115 mg/dL — ABNORMAL HIGH (ref 70–99)

## 2011-07-07 LAB — MAGNESIUM: Magnesium: 2 mg/dL (ref 1.5–2.5)

## 2011-07-07 MED ORDER — M.V.I. ADULT IV INJ
INTRAVENOUS | Status: AC
  Administered 2011-07-07: 17:00:00 via INTRAVENOUS
  Filled 2011-07-07: qty 2000

## 2011-07-07 MED ORDER — HYDROMORPHONE 0.3 MG/ML IV SOLN
INTRAVENOUS | Status: AC
  Filled 2011-07-07: qty 25

## 2011-07-07 MED ORDER — SODIUM CHLORIDE 0.9 % IV SOLN
INTRAVENOUS | Status: DC
  Administered 2011-07-07 – 2011-07-10 (×2): via INTRAVENOUS

## 2011-07-07 MED ORDER — SODIUM CHLORIDE 0.9 % IJ SOLN
INTRAMUSCULAR | Status: AC
  Filled 2011-07-07: qty 3

## 2011-07-07 MED ORDER — MAGNESIUM CITRATE PO SOLN
1.0000 | Freq: Once | ORAL | Status: DC
  Filled 2011-07-07: qty 296

## 2011-07-07 NOTE — Consult Note (Signed)
PARENTERAL NUTRITION CONSULT NOTE   Pharmacy Consult for TPN Indication: s/p antrectomy, anticipated ileus, delay in starting enteral feeding  Allergies  Allergen Reactions  . Compazine Shortness Of Breath    Tongue swelling  . Phenergan Shortness Of Breath    Tongue swelling  . Vistaril (Hyzine) Shortness Of Breath    Tongue swelling  . Latex Swelling   Patient Measurements: Height: 5\' 4"  (162.6 cm) Weight: 144 lb 10 oz (65.6 kg) IBW/kg (Calculated) : 54.7   Vital Signs: Temp: 97.6 F (36.4 C) (04/17 0400) Temp src: Axillary (04/17 0400) BP: 111/74 mmHg (04/17 0800) Pulse Rate: 87  (04/17 0800) Intake/Output from previous day: 04/16 0701 - 04/17 0700 In: 3136.1 [I.V.:762.4; IV Piggyback:300; TPN:1883.8] Out: 2641 [Urine:2050; Emesis/NG output:1; Drains:590] Intake/Output from this shift: Total I/O In: -  Out: 60 [Drains:60]  Labs:  [facility] 07/07/11 0524 07/06/11 0500 07/05/11 0423  WBC 26.2* 22.9* 17.3*  HGB 9.7* 9.1* 9.3*  HCT 28.7* 27.6* 28.1*  PLT 406* 284 260  APTT -- -- --  INR -- -- --    Basename 07/07/11 0524 07/06/11 0500 07/05/11 0424 07/05/11 0423  NA 131* 132* -- 132*  K 3.6 3.9 -- 3.9  CL 95* 96 -- 98  CO2 26 28 -- 26  GLUCOSE 126* 104* -- 109*  BUN 11 9 -- 10  CREATININE 0.32* 0.39* -- 0.37*  LABCREA -- -- -- --  CREAT24HRUR -- -- -- --  CALCIUM 8.5 8.5 -- 8.1*  MG 2.0 2.0 -- 1.8  PHOS 3.7 3.7 -- 3.0  PROT 5.8* 5.2* -- 5.2*  ALBUMIN 1.7* 1.6* -- 1.6*  AST 53* 70* -- 161*  ALT 39* 48* -- 77*  ALKPHOS 129* 186* -- 145*  BILITOT 2.6* 3.0* -- 4.6*  BILIDIR -- -- -- --  IBILI -- -- -- --  PREALBUMIN -- -- -- 2.9*  TRIG -- 147 176* --  CHOLHDL -- -- -- --  CHOL -- -- 84 --   Estimated Creatinine Clearance: 68.6 ml/min (by C-G formula based on Cr of 0.32).    Basename 07/07/11 0755 07/07/11 0409 07/06/11 2350  GLUCAP 116* 115* 119*   Medical History: Past Medical History  Diagnosis Date  . RA (rheumatoid arthritis)  2008  . MI  (myocardial infarction) 2008    Not well documented, patient reports reassuring cardiac catheterization and stress testing in [place]  . Constipation 03/21/2011  . Hypokalemia 03/21/2011  . Fatty liver 03/21/2011  . Pancreatitis     states 3 years ago, very severe, ?biliary pancreatitis   . T12 compression fracture 2011  . Duodenal ulcer     remote per patient. +BC powders, patient report negative EGD six months ago  . Dilated pancreatic duct     ?chronic pancreatitis, EUS pending (06/02/11)   Medications:  Scheduled:     . bisacodyl  10 mg Rectal BID  . chlorhexidine  15 mL Mouth/Throat BID  . enoxaparin  40 mg Subcutaneous Q24H  . fluconazole (DIFLUCAN) IV  200 mg Intravenous Q24H  . HYDROmorphone PCA 0.3 mg/mL   Intravenous Q4H  . HYDROmorphone PCA 0.3 mg/mL      . HYDROmorphone PCA 0.3 mg/mL      . HYDROmorphone PCA 0.3 mg/mL      . insulin aspart  0-9 Units Subcutaneous Q4H  . metoprolol  5 mg Intravenous Q8H  . ondansetron (ZOFRAN) IV  4 mg Intravenous TID AC  . pantoprazole (PROTONIX) IV  40 mg Intravenous BID AC  .  piperacillin-tazobactam (ZOSYN)  IV  3.375 g Intravenous Q8H  . sodium chloride  10-40 mL Intracatheter Q12H  . DISCONTD: ondansetron  4 mg Intravenous Once   Insulin Requirements in the past 24 hours:  n/a  Current Nutrition:  TPN at 68ml/hr  Assessment: Estimated Creatinine Clearance: 68.6 ml/min (by C-G formula based on Cr of 0.32). Hyponatremia not resolving Calcium corrects to WNL due to low albumin Hypoalbuminemia (not improving.  Increased AST/ALT, Alk Phos, Triglycerides & T.Bili., improving since off Lipids  Nutritional Goals per RD: Estimated Nutritional Needs:  Kcal:1725-2070 kcal per day  Protein:104-117 grams per day  Fluid:1 ml per kcal  Plan: Change TPN to Clinimix 5/15 at 1ml/hr MVI in TPN every Mon-Wed-Fri Change IVF to NS at 83ml/hr No lipids or trace elements with TPN due to LFTs rising (discussed with Dr. Lovell Sheehan) Labs  per protocol Monitor lytes, fluid status, glucose tolerance, renal fxn Repeat CMET in AM to evaluate LFTs.  Humira will be stopped while in hospital (discussed with Dr. Lovell Sheehan).  Margo Aye, Nathaniel Wakeley A 07/07/2011,9:18 AM

## 2011-07-07 NOTE — Progress Notes (Signed)
Pt up to chair with little complaint. Pt tolerating sips of ginger ale.

## 2011-07-07 NOTE — Progress Notes (Signed)
9 Days Post-Op  Subjective: Resting comfortably.  Objective: Vital signs in last 24 hours: Temp:  [97.6 F (36.4 C)-99.6 F (37.6 C)] 98 F (36.7 C) (04/17 1200) Pulse Rate:  [87-108] 97  (04/17 1200) Resp:  [20-28] 23  (04/17 1300) BP: (98-139)/(65-96) 124/82 mmHg (04/17 1300) SpO2:  [93 %-99 %] 95 % (04/17 1200) Weight:  [65.6 kg (144 lb 10 oz)] 65.6 kg (144 lb 10 oz) (04/17 0500) Last BM Date: 07/07/11  Intake/Output from previous day: 04/16 0701 - 04/17 0700 In: 3136.1 [I.V.:762.4; IV Piggyback:300; TPN:1883.8] Out: 2641 [Urine:2050; Emesis/NG output:1; Drains:590] Intake/Output this shift: Total I/O In: 685 [I.V.:7; IV Piggyback:228; TPN:450] Out: 460 [Urine:250; Drains:210]  General appearance: Comfortable. Resp: clear to auscultation bilaterally Cardio: regular rate and rhythm, S1, S2 normal, no murmur, click, rub or gallop GI: Soft, flat. Minimal bowel sounds heard. JP drain still with bilious drainage. No rigidity noted  Lab Results:   Basename 07/07/11 0524 07/06/11 0500  WBC 26.2* 22.9*  HGB 9.7* 9.1*  HCT 28.7* 27.6*  PLT 406* 284   BMET  Basename 07/07/11 0524 07/06/11 0500  NA 131* 132*  K 3.6 3.9  CL 95* 96  CO2 26 28  GLUCOSE 126* 104*  BUN 11 9  CREATININE 0.32* 0.39*  CALCIUM 8.5 8.5   PT/INR No results found for this basename: LABPROT:2,INR:2 in the last 72 hours  Studies/Results: Ct Abdomen Pelvis W Contrast  07/06/2011  *RADIOLOGY REPORT*  Clinical Data: Recently postop from gastric antrectomy and gastrojejunostomy.  Peptic ulcer disease.  Acute pancreatitis. Nausea.  CT ABDOMEN AND PELVIS WITH CONTRAST  Technique:  Multidetector CT imaging of the abdomen and pelvis was performed following the standard protocol during bolus administration of intravenous contrast.  Contrast: OMNIPAQUE IOHEXOL 300 MG/ML  SOLN  Comparison: 05/10/2011  Findings: New right lower lobe atelectasis and small to moderate right pleural effusion are  demonstrated.  A tiny amount of free air is seen which is attributable to to the recent surgery.  Mild to moderate ascites is new since previous study.  A surgical drain is seen in Morison's pouch. Recent postop changes are seen from gastric antrectomy and gastrojejunostomy. The surgical clips are again seen from prior cholecystectomy, and biliary ductal dilatation is unchanged.  Moderate severe dilatation of small bowel is seen, however contrast has passed into the colon. Wall thickening is seen involving the ascending colon and hepatic flexure, likely due to postoperative change, however there is no evidence of bowel obstruction.  This is most consistent with a postop ileus.  A fluid collection with early peripheral enhancement is seen in the right lower quadrant anterior to the psoas which measures 4.2 x 6.4 cm.  This is new and could represent a pseudocyst, abscess, or other postoperative fluid collection.  A Foley catheter is seen within the bladder which is decompressed.  No soft tissue masses or lymphadenopathy identified within the abdomen or pelvis. Hepatic steatosis and small left upper pole renal cyst remains stable.  No other abdominal parenchymal organs are unremarkable.  No evidence of hydronephrosis.  Prior hysterectomy noted.  IMPRESSION:  1.  Recent postoperative changes from gastric antrectomy and gastrojejunostomy.  Probable severe postop ileus. 2.  4 x 6 cm right lower quadrant fluid collection anterior to the psoas muscle and iliac vessels, which may represent a postoperative fluid collection, abscess, or pseudocyst. 3.  Moderate ascites, small right pleural effusion, and bibasilar atelectasis. 4.  Stable hepatic steatosis.  Original Report Authenticated By: Mayra Neer.  Eppie Gibson, M.D.    Anti-infectives: Anti-infectives     Start     Dose/Rate Route Frequency Ordered Stop   07/06/11 1000  piperacillin-tazobactam (ZOSYN) IVPB 3.375 g       3.375 g 12.5 mL/hr over 240 Minutes Intravenous Every 8  hours 07/06/11 0759     06/30/11 0900   fluconazole (DIFLUCAN) IVPB 200 mg        200 mg 100 mL/hr over 60 Minutes Intravenous Every 24 hours 06/30/11 0807     06/29/11 1300   ertapenem (INVANZ) 1 g in sodium chloride 0.9 % 50 mL IVPB        1 g 100 mL/hr over 30 Minutes Intravenous Every 24 hours 06/28/11 2007 06/29/11 1416   06/28/11 1330   ertapenem (INVANZ) 1 g in sodium chloride 0.9 % 50 mL IVPB  Status:  Discontinued        1 g 100 mL/hr over 30 Minutes Intravenous Every 24 hours 06/28/11 1315 06/28/11 1840   06/28/11 1309   sodium chloride 0.9 % with ertapenem [facility]) ADS Med  Status:  Discontinued     Comments: BELL, ANITRA: cabinet override         06/28/11 1309 06/28/11 1310          Assessment/Plan: s/p Procedure(s): ANTRECTOMY GASTROJEJUNOSTOMY Impression: Still with bilious controlled leak from: Adult go duodenostomy. CT scan the abdomen pelvis yesterday revealed fluid collections in the pelvis in the right lower quadrant. They did not appear by CT criteria 2 be an abscess yet. I am concerned that her leukocytosis continues to rise. Will watch over the next 24 hours. Its should still continue to rise, I will get a CT guided drainage of the right lower quadrant fluid collection. Increasing the Ativan seems to have helped her significantly.  LOS: 17 days    Emeka Lindner A 07/07/2011

## 2011-07-07 NOTE — Progress Notes (Signed)
UR Chart Review Completed  

## 2011-07-07 NOTE — Telephone Encounter (Signed)
Reminder in epic to follow up in 3 months to consider colonoscopy, ?MRCP for dilation of pancreatic duct

## 2011-07-08 ENCOUNTER — Ambulatory Visit (HOSPITAL_COMMUNITY): Admission: RE | Admit: 2011-07-08 | Source: Ambulatory Visit | Admitting: Gastroenterology

## 2011-07-08 ENCOUNTER — Encounter (HOSPITAL_COMMUNITY): Admission: RE | Payer: Self-pay | Source: Ambulatory Visit

## 2011-07-08 LAB — COMPREHENSIVE METABOLIC PANEL
AST: 62 U/L — ABNORMAL HIGH (ref 0–37)
BUN: 12 mg/dL (ref 6–23)
CO2: 27 mEq/L (ref 19–32)
Calcium: 8.2 mg/dL — ABNORMAL LOW (ref 8.4–10.5)
Chloride: 94 mEq/L — ABNORMAL LOW (ref 96–112)
Creatinine, Ser: 0.44 mg/dL — ABNORMAL LOW (ref 0.50–1.10)
GFR calc Af Amer: >90 mL/min (ref >90)
GFR calc non Af Amer: >90 mL/min (ref >90)
Glucose, Bld: 114 mg/dL — ABNORMAL HIGH (ref 70–99)
Total Bilirubin: 1.9 mg/dL — ABNORMAL HIGH (ref 0.3–1.2)

## 2011-07-08 LAB — PHOSPHORUS: Phosphorus: 3.4 mg/dL (ref 2.3–4.6)

## 2011-07-08 LAB — GLUCOSE, CAPILLARY
Glucose-Capillary: 109 mg/dL — ABNORMAL HIGH (ref 70–99)
Glucose-Capillary: 131 mg/dL — ABNORMAL HIGH (ref 70–99)
Glucose-Capillary: 97 mg/dL (ref 70–99)

## 2011-07-08 LAB — CULTURE, BLOOD (ROUTINE X 2): Culture: NO GROWTH

## 2011-07-08 LAB — CBC
HCT: 26.6 % — ABNORMAL LOW (ref 36.0–46.0)
MCHC: 33.5 g/dL (ref 30.0–36.0)
Platelets: 478 10*3/uL — ABNORMAL HIGH (ref 150–400)
RDW: 17.6 % — ABNORMAL HIGH (ref 11.5–15.5)

## 2011-07-08 SURGERY — UPPER ENDOSCOPIC ULTRASOUND (EUS) LINEAR
Anesthesia: Monitor Anesthesia Care

## 2011-07-08 MED ORDER — CLINIMIX E/DEXTROSE (5/15) 5 % IV SOLN
INTRAVENOUS | Status: AC
  Administered 2011-07-08: 17:00:00 via INTRAVENOUS
  Filled 2011-07-08: qty 2000

## 2011-07-08 MED ORDER — HYDROMORPHONE 0.3 MG/ML IV SOLN
INTRAVENOUS | Status: AC
  Administered 2011-07-08: 03:00:00
  Filled 2011-07-08: qty 25

## 2011-07-08 NOTE — Progress Notes (Signed)
10 Days Post-Op  Subjective: Has had more bowel movements. Is sitting in a chair. She still has some abdominal pain.  Objective: Vital signs in last 24 hours: Temp:  [97.5 F (36.4 C)-100.1 F (37.8 C)] 100 F (37.8 C) (04/18 0400) Pulse Rate:  [85-97] 85  (04/17 1600) Resp:  [20-29] 29  (04/18 0301) BP: (98-126)/(65-86) 116/75 mmHg (04/17 1600) SpO2:  [95 %-99 %] 96 % (04/18 0301) FiO2 (%):  [96 %] 96 % (04/18 0336) Weight:  [69.2 kg (152 lb 8.9 oz)] 69.2 kg (152 lb 8.9 oz) (04/18 0500) Last BM Date: 07/07/11  Intake/Output from previous day: 04/17 0701 - 04/18 0700 In: 1473.8 [P.O.:240; I.V.:106.3; IV Piggyback:280; TPN:847.5] Out: 2890 [Urine:2200; Drains:690] Intake/Output this shift:    General appearance: cooperative and mild distress Resp: clear to auscultation bilaterally Cardio: regular rate and rhythm, S1, S2 normal, no murmur, click, rub or gallop GI: Soft, flat. Incisions healing well. JP drainage still bilious.  Lab Results:   Basename 07/08/11 0458 07/07/11 0524  WBC 25.3* 26.2*  HGB 8.9* 9.7*  HCT 26.6* 28.7*  PLT 478* 406*   BMET  Basename 07/08/11 0458 07/07/11 0524  NA 129* 131*  K 3.8 3.6  CL 94* 95*  CO2 27 26  GLUCOSE 114* 126*  BUN 12 11  CREATININE 0.44* 0.32*  CALCIUM 8.2* 8.5   PT/INR No results found for this basename: LABPROT:2,INR:2 in the last 72 hours  Studies/Results: Ct Abdomen Pelvis W Contrast  07/06/2011  *RADIOLOGY REPORT*  Clinical Data: Recently postop from gastric antrectomy and gastrojejunostomy.  Peptic ulcer disease.  Acute pancreatitis. Nausea.  CT ABDOMEN AND PELVIS WITH CONTRAST  Technique:  Multidetector CT imaging of the abdomen and pelvis was performed following the standard protocol during bolus administration of intravenous contrast.  Contrast: OMNIPAQUE IOHEXOL 300 MG/ML  SOLN  Comparison: 05/10/2011  Findings: New right lower lobe atelectasis and small to moderate right pleural effusion are demonstrated.   A tiny amount of free air is seen which is attributable to to the recent surgery.  Mild to moderate ascites is new since previous study.  A surgical drain is seen in Morison's pouch. Recent postop changes are seen from gastric antrectomy and gastrojejunostomy. The surgical clips are again seen from prior cholecystectomy, and biliary ductal dilatation is unchanged.  Moderate severe dilatation of small bowel is seen, however contrast has passed into the colon. Wall thickening is seen involving the ascending colon and hepatic flexure, likely due to postoperative change, however there is no evidence of bowel obstruction.  This is most consistent with a postop ileus.  A fluid collection with early peripheral enhancement is seen in the right lower quadrant anterior to the psoas which measures 4.2 x 6.4 cm.  This is new and could represent a pseudocyst, abscess, or other postoperative fluid collection.  A Foley catheter is seen within the bladder which is decompressed.  No soft tissue masses or lymphadenopathy identified within the abdomen or pelvis. Hepatic steatosis and small left upper pole renal cyst remains stable.  No other abdominal parenchymal organs are unremarkable.  No evidence of hydronephrosis.  Prior hysterectomy noted.  IMPRESSION:  1.  Recent postoperative changes from gastric antrectomy and gastrojejunostomy.  Probable severe postop ileus. 2.  4 x 6 cm right lower quadrant fluid collection anterior to the psoas muscle and iliac vessels, which may represent a postoperative fluid collection, abscess, or pseudocyst. 3.  Moderate ascites, small right pleural effusion, and bibasilar atelectasis. 4.  Stable  hepatic steatosis.  Original Report Authenticated By: Danae Orleans, M.D.    Anti-infectives: Anti-infectives     Start     Dose/Rate Route Frequency Ordered Stop   07/06/11 1000  piperacillin-tazobactam (ZOSYN) IVPB 3.375 g       3.375 g 12.5 mL/hr over 240 Minutes Intravenous Every 8 hours 07/06/11  0759     06/30/11 0900   fluconazole (DIFLUCAN) IVPB 200 mg        200 mg 100 mL/hr over 60 Minutes Intravenous Every 24 hours 06/30/11 0807     06/29/11 1300   ertapenem (INVANZ) 1 g in sodium chloride 0.9 % 50 mL IVPB        1 g 100 mL/hr over 30 Minutes Intravenous Every 24 hours 06/28/11 2007 06/29/11 1416   06/28/11 1330   ertapenem (INVANZ) 1 g in sodium chloride 0.9 % 50 mL IVPB  Status:  Discontinued        1 g 100 mL/hr over 30 Minutes Intravenous Every 24 hours 06/28/11 1315 06/28/11 1840   06/28/11 1309   sodium chloride 0.9 % with ertapenem [facility]) ADS Med  Status:  Discontinued     Comments: BELL, ANITRA: cabinet override         06/28/11 1309 06/28/11 1310          Assessment/Plan: s/p Procedure(s): ANTRECTOMY GASTROJEJUNOSTOMY Impression: Bowel function is returning. Her leukocytosis has stabilized. Her bilirubin has decreased significantly. Will still delay any further CT-guided drainage of intra-abdominal fluid as patient is not worsening at this time. We'll advance to full liquid diet.  LOS: 18 days    Jordan Haney A 07/08/2011

## 2011-07-08 NOTE — Consult Note (Signed)
PARENTERAL NUTRITION CONSULT NOTE   Pharmacy Consult for TPN Indication: s/p antrectomy, anticipated ileus, delay in starting enteral feeding  Allergies  Allergen Reactions  . Compazine Shortness Of Breath    Tongue swelling  . Phenergan Shortness Of Breath    Tongue swelling  . Vistaril (Hyzine) Shortness Of Breath    Tongue swelling  . Latex Swelling   Patient Measurements: Height: 5\' 4"  (162.6 cm) Weight: 152 lb 8.9 oz (69.2 kg) IBW/kg (Calculated) : 54.7   Vital Signs: Temp: 98.3 F (36.8 C) (04/18 0930) Temp src: Oral (04/18 0930) Intake/Output from previous day: 04/17 0701 - 04/18 0700 In: 1473.8 [P.O.:240; I.V.:106.3; IV Piggyback:280; TPN:847.5] Out: 2890 [Urine:2200; Drains:690] Intake/Output from this shift: Total I/O In: -  Out: 265 [Urine:175; Drains:90]  Labs:  [place] 07/08/11 0458 07/07/11 0524 07/06/11 0500  WBC 25.3* 26.2* 22.9*  HGB 8.9* 9.7* 9.1*  HCT 26.6* 28.7* 27.6*  PLT 478* 406* 284  APTT -- -- --  INR -- -- --    Basename 07/08/11 0458 07/07/11 0524 07/06/11 0500  NA 129* 131* 132*  K 3.8 3.6 3.9  CL 94* 95* 96  CO2 27 26 28   GLUCOSE 114* 126* 104*  BUN 12 11 9   CREATININE 0.44* 0.32* 0.39*  LABCREA -- -- --  CREAT24HRUR -- -- --  CALCIUM 8.2* 8.5 8.5  MG 1.9 2.0 2.0  PHOS 3.4 3.7 3.7  PROT 5.5* 5.8* 5.2*  ALBUMIN 1.7* 1.7* 1.6*  AST 62* 53* 70*  ALT 39* 39* 48*  ALKPHOS 109 129* 186*  BILITOT 1.9* 2.6* 3.0*  BILIDIR -- -- --  IBILI -- -- --  PREALBUMIN -- -- --  TRIG -- -- 147  CHOLHDL -- -- --  CHOL -- -- --   Estimated Creatinine Clearance: 75.9 ml/min (by C-G formula based on Cr of 0.44).    Basename 07/08/11 0454 07/08/11 0018 07/07/11 2005  GLUCAP 109* 97 119*   Medical History: Past Medical History  Diagnosis Date  . RA (rheumatoid arthritis)  2008  . MI (myocardial infarction) 2008    Not well documented, patient reports reassuring cardiac catheterization and stress testing in [place]  . Constipation  03/21/2011  . Hypokalemia 03/21/2011  . Fatty liver 03/21/2011  . Pancreatitis     states 3 years ago, very severe, ?biliary pancreatitis   . T12 compression fracture 2011  . Duodenal ulcer     remote per patient. +BC powders, patient report negative EGD six months ago  . Dilated pancreatic duct     ?chronic pancreatitis, EUS pending (06/02/11)   Medications:  Scheduled:     . chlorhexidine  15 mL Mouth/Throat BID  . enoxaparin  40 mg Subcutaneous Q24H  . fluconazole (DIFLUCAN) IV  200 mg Intravenous Q24H  . HYDROmorphone PCA 0.3 mg/mL   Intravenous Q4H  . HYDROmorphone PCA 0.3 mg/mL      . HYDROmorphone PCA 0.3 mg/mL      . insulin aspart  0-9 Units Subcutaneous Q4H  . magnesium citrate  1 Bottle Oral Once  . metoprolol  5 mg Intravenous Q8H  . ondansetron (ZOFRAN) IV  4 mg Intravenous TID AC  . pantoprazole (PROTONIX) IV  40 mg Intravenous BID AC  . piperacillin-tazobactam (ZOSYN)  IV  3.375 g Intravenous Q8H  . sodium chloride  10-40 mL Intracatheter Q12H  . sodium chloride      . DISCONTD: bisacodyl  10 mg Rectal BID   Insulin Requirements in the past 24 hours:  n/a  Current Nutrition:  TPN at 77ml/hr  Assessment: Estimated Creatinine Clearance: 75.9 ml/min (by C-G formula based on Cr of 0.44). Hyponatremia not resolving (IV changed to NS) Calcium corrects to WNL due to low albumin Hypoalbuminemia (not improving) Advancing to full liquid diet per MD  Nutritional Goals per RD: Estimated Nutritional Needs:  Kcal:1725-2070 kcal per day  Protein:104-117 grams per day  Fluid:1 ml per kcal  Plan: TPN: Clinimix E 5/15 at 50ml/hr MVI in TPN every Mon-Wed-Fri Change IVF to NS at 65ml/hr No lipids or trace elements with TPN due to LFTs rising (discussed with Dr. Lovell Sheehan) Labs per protocol Monitor lytes, fluid status, glucose tolerance, renal fxn  Humira will be stopped while in hospital (discussed with Dr. Lovell Sheehan).  Margo Aye, Hannah Strader A 07/08/2011,10:30 AM

## 2011-07-09 LAB — COMPREHENSIVE METABOLIC PANEL
ALT: 38 U/L — ABNORMAL HIGH (ref 0–35)
AST: 47 U/L — ABNORMAL HIGH (ref 0–37)
Albumin: 1.7 g/dL — ABNORMAL LOW (ref 3.5–5.2)
CO2: 28 mEq/L (ref 19–32)
Chloride: 99 mEq/L (ref 96–112)
Creatinine, Ser: 0.35 mg/dL — ABNORMAL LOW (ref 0.50–1.10)
Sodium: 133 mEq/L — ABNORMAL LOW (ref 135–145)
Total Bilirubin: 1.6 mg/dL — ABNORMAL HIGH (ref 0.3–1.2)

## 2011-07-09 LAB — CBC
HCT: 25.7 % — ABNORMAL LOW (ref 36.0–46.0)
Hemoglobin: 8.3 g/dL — ABNORMAL LOW (ref 12.0–15.0)
MCV: 86.2 fL (ref 78.0–100.0)
WBC: 23.5 10*3/uL — ABNORMAL HIGH (ref 4.0–10.5)

## 2011-07-09 LAB — GLUCOSE, CAPILLARY
Glucose-Capillary: 110 mg/dL — ABNORMAL HIGH (ref 70–99)
Glucose-Capillary: 101 mg/dL — ABNORMAL HIGH (ref 70–99)

## 2011-07-09 LAB — PHOSPHORUS: Phosphorus: 3.3 mg/dL (ref 2.3–4.6)

## 2011-07-09 MED ORDER — HYDROMORPHONE 0.3 MG/ML IV SOLN
INTRAVENOUS | Status: AC
  Administered 2011-07-09: 12:00:00
  Filled 2011-07-09: qty 25

## 2011-07-09 MED ORDER — HYDROMORPHONE 0.3 MG/ML IV SOLN
INTRAVENOUS | Status: AC
  Filled 2011-07-09: qty 25

## 2011-07-09 MED ORDER — HYDROMORPHONE 0.3 MG/ML IV SOLN
INTRAVENOUS | Status: AC
  Administered 2011-07-09: 02:00:00
  Filled 2011-07-09: qty 25

## 2011-07-09 MED ORDER — SODIUM CHLORIDE 0.9 % IJ SOLN
INTRAMUSCULAR | Status: AC
  Administered 2011-07-09: 10 mL
  Filled 2011-07-09: qty 3

## 2011-07-09 MED ORDER — CLINIMIX E/DEXTROSE (5/15) 5 % IV SOLN
INTRAVENOUS | Status: AC
  Administered 2011-07-09: 18:00:00 via INTRAVENOUS
  Filled 2011-07-09 (×2): qty 2000

## 2011-07-09 NOTE — Progress Notes (Signed)
Attempted to see pt for eval.  She was up in a chair and able to answer a few questions before she started to moan in severe pain and said that she could not move, much less work with me.  RN alerted.  We will not be able to see pt again until Monday.

## 2011-07-09 NOTE — Consult Note (Signed)
PARENTERAL NUTRITION CONSULT NOTE   Pharmacy Consult for TPN Indication: s/p antrectomy  Allergies  Allergen Reactions  . Compazine Shortness Of Breath    Tongue swelling  . Phenergan Shortness Of Breath    Tongue swelling  . Vistaril (Hyzine) Shortness Of Breath    Tongue swelling  . Latex Swelling   Patient Measurements: Height: 5\' 4"  (162.6 cm) Weight: 154 lb 12.2 oz (70.2 kg) IBW/kg (Calculated) : 54.7   Vital Signs: Temp: 98.6 F (37 C) (04/19 0400) Temp src: Oral (04/19 0400) BP: 114/73 mmHg (04/19 1100) Pulse Rate: 94  (04/19 1100) Intake/Output from previous day: 04/18 0701 - 04/19 0700 In: 3946 [P.O.:240; I.V.:200; IV Piggyback:300; TPN:3016] Out: 2912 [Urine:2300; Drains:610; Stool:2] Intake/Output from this shift: Total I/O In: 582 [I.V.:100; IV Piggyback:162; TPN:320] Out: 190 [Drains:190]  Labs:  [facility] 07/09/11 0459 07/08/11 0458 07/07/11 0524  WBC 23.5* 25.3* 26.2*  HGB 8.3* 8.9* 9.7*  HCT 25.7* 26.6* 28.7*  PLT 537* 478* 406*  APTT -- -- --  INR -- -- --    Basename 07/09/11 0459 07/08/11 0458 07/07/11 0524  NA 133* 129* 131*  K 3.5 3.8 3.6  CL 99 94* 95*  CO2 28 27 26   GLUCOSE 107* 114* 126*  BUN 9 12 11   CREATININE 0.35* 0.44* 0.32*  LABCREA -- -- --  CREAT24HRUR -- -- --  CALCIUM 8.2* 8.2* 8.5  MG 2.2 1.9 2.0  PHOS 3.3 3.4 3.7  PROT 5.6* 5.5* 5.8*  ALBUMIN 1.7* 1.7* 1.7*  AST 47* 62* 53*  ALT 38* 39* 39*  ALKPHOS 104 109 129*  BILITOT 1.6* 1.9* 2.6*  BILIDIR -- -- --  IBILI -- -- --  PREALBUMIN -- -- --  TRIG -- -- --  CHOLHDL -- -- --  CHOL -- -- --   Estimated Creatinine Clearance: 76.4 ml/min (by C-G formula based on Cr of 0.35).    Basename 07/09/11 0804 07/09/11 0434 07/09/11 0008  GLUCAP 113* 119* 124*   Medical History: Past Medical History  Diagnosis Date  . RA (rheumatoid arthritis)  2008  . MI (myocardial infarction) 2008    Not well documented, patient reports reassuring cardiac catheterization and  stress testing in [place]  . Constipation 03/21/2011  . Hypokalemia 03/21/2011  . Fatty liver 03/21/2011  . Pancreatitis     states 3 years ago, very severe, ?biliary pancreatitis   . T12 compression fracture 2011  . Duodenal ulcer     remote per patient. +BC powders, patient report negative EGD six months ago  . Dilated pancreatic duct     ?chronic pancreatitis, EUS pending (06/02/11)   Medications:  Scheduled:     . chlorhexidine  15 mL Mouth/Throat BID  . enoxaparin  40 mg Subcutaneous Q24H  . fluconazole (DIFLUCAN) IV  200 mg Intravenous Q24H  . HYDROmorphone PCA 0.3 mg/mL   Intravenous Q4H  . HYDROmorphone PCA 0.3 mg/mL      . HYDROmorphone PCA 0.3 mg/mL      . insulin aspart  0-9 Units Subcutaneous Q4H  . magnesium citrate  1 Bottle Oral Once  . metoprolol  5 mg Intravenous Q8H  . ondansetron (ZOFRAN) IV  4 mg Intravenous TID AC  . pantoprazole (PROTONIX) IV  40 mg Intravenous BID AC  . piperacillin-tazobactam (ZOSYN)  IV  3.375 g Intravenous Q8H  . sodium chloride  10-40 mL Intracatheter Q12H  . sodium chloride       Insulin Requirements in the past 24 hours:  n/a  Current Nutrition:  TPN at 38ml/hr  Assessment: Estimated Creatinine Clearance: 76.4 ml/min (by C-G formula based on Cr of 0.35). Advancing diet to soft Calcium corrects to WNL due to low albumin Hypoalbuminemia (not improving)  Nutritional Goals per RD: Estimated Nutritional Needs:  Kcal:1725-2070 kcal per day  Protein:104-117 grams per day  Fluid:1 ml per kcal  Plan: Reduce TPN to 65ml/hr today (advancing diet), D/W Dr Lovell Sheehan Anticipate D/C TPN tomorrow No lipids or trace elements with TPN due to LFTs  (discussed with Dr. Lovell Sheehan)  Valrie Hart A 07/09/2011,1:12 PM

## 2011-07-09 NOTE — Progress Notes (Signed)
11 Days Post-Op  Subjective: States she feels slightly better. She continues to tolerate full liquid diet well. She is having bowel movements. Her pain is under better control.  Objective: Vital signs in last 24 hours: Temp:  [98.6 F (37 C)-99 F (37.2 C)] 98.6 F (37 C) (04/19 0400) Pulse Rate:  [87-111] 94  (04/19 1100) Resp:  [18-36] 24  (04/19 1206) BP: (96-160)/(62-90) 114/73 mmHg (04/19 1100) SpO2:  [93 %-99 %] 97 % (04/19 1206) Weight:  [70.2 kg (154 lb 12.2 oz)] 70.2 kg (154 lb 12.2 oz) (04/19 0500) Last BM Date: 07/08/11  Intake/Output from previous day: 04/18 0701 - 04/19 0700 In: 3946 [P.O.:240; I.V.:200; IV Piggyback:300; TPN:3016] Out: 2912 [Urine:2300; Drains:610; Stool:2] Intake/Output this shift: Total I/O In: 582 [I.V.:100; IV Piggyback:162; TPN:320] Out: 190 [Drains:190]  General appearance: alert and cooperative Resp: clear to auscultation bilaterally Cardio: regular rate and rhythm, S1, S2 normal, no murmur, click, rub or gallop GI: Soft, flat. Good bowel sounds heard. No bilious drainage noted in Jackson-Pratt drain.  Lab Results:   Basename 07/09/11 0459 07/08/11 0458  WBC 23.5* 25.3*  HGB 8.3* 8.9*  HCT 25.7* 26.6*  PLT 537* 478*   BMET  Basename 07/09/11 0459 07/08/11 0458  NA 133* 129*  K 3.5 3.8  CL 99 94*  CO2 28 27  GLUCOSE 107* 114*  BUN 9 12  CREATININE 0.35* 0.44*  CALCIUM 8.2* 8.2*   PT/INR No results found for this basename: LABPROT:2,INR:2 in the last 72 hours  Studies/Results: No results found.  Anti-infectives: Anti-infectives     Start     Dose/Rate Route Frequency Ordered Stop   07/06/11 1000  piperacillin-tazobactam (ZOSYN) IVPB 3.375 g       3.375 g 12.5 mL/hr over 240 Minutes Intravenous Every 8 hours 07/06/11 0759     06/30/11 0900   fluconazole (DIFLUCAN) IVPB 200 mg        200 mg 100 mL/hr over 60 Minutes Intravenous Every 24 hours 06/30/11 0807     06/29/11 1300   ertapenem (INVANZ) 1 g in sodium  chloride 0.9 % 50 mL IVPB        1 g 100 mL/hr over 30 Minutes Intravenous Every 24 hours 06/28/11 2007 06/29/11 1416   06/28/11 1330   ertapenem (INVANZ) 1 g in sodium chloride 0.9 % 50 mL IVPB  Status:  Discontinued        1 g 100 mL/hr over 30 Minutes Intravenous Every 24 hours 06/28/11 1315 06/28/11 1840   06/28/11 1309   sodium chloride 0.9 % with ertapenem [facility]) ADS Med  Status:  Discontinued     Comments: BELL, ANITRA: cabinet override         06/28/11 1309 06/28/11 1310          Assessment/Plan: s/p Procedure(s): ANTRECTOMY GASTROJEJUNOSTOMY Impression: Patient continues to slowly improve. She does still have bilious JP drainage, but her total bilirubin has decreased significantly. Her leukocytosis is slightly improved. We'll continue with current antibiotic therapy. Will advance to [place] soft regular diet and decrease her TPN. I've discussed this with pharmacy who will manage this. We'll get physical therapy consult for ambulatory purposes.  LOS: 19 days    Jordan Haney 07/09/2011

## 2011-07-10 LAB — COMPREHENSIVE METABOLIC PANEL
ALT: 53 U/L — ABNORMAL HIGH (ref 0–35)
Alkaline Phosphatase: 127 U/L — ABNORMAL HIGH (ref 39–117)
Chloride: 96 mEq/L (ref 96–112)
GFR calc Af Amer: >90 mL/min (ref >90)
Glucose, Bld: 103 mg/dL — ABNORMAL HIGH (ref 70–99)
Potassium: 3.6 mEq/L (ref 3.5–5.1)
Sodium: 131 mEq/L — ABNORMAL LOW (ref 135–145)
Total Bilirubin: 1.6 mg/dL — ABNORMAL HIGH (ref 0.3–1.2)
Total Protein: 6.1 g/dL (ref 6.0–8.3)

## 2011-07-10 LAB — CBC
Hemoglobin: 8.6 g/dL — ABNORMAL LOW (ref 12.0–15.0)
MCH: 27.9 pg (ref 26.0–34.0)
MCHC: 32 g/dL (ref 30.0–36.0)
MCV: 87.3 fL (ref 78.0–100.0)
Platelets: 749 10*3/uL — ABNORMAL HIGH (ref 150–400)
RBC: 3.08 MIL/uL — ABNORMAL LOW (ref 3.87–5.11)

## 2011-07-10 LAB — GLUCOSE, CAPILLARY: Glucose-Capillary: 106 mg/dL — ABNORMAL HIGH (ref 70–99)

## 2011-07-10 MED ORDER — SODIUM CHLORIDE 0.9 % IJ SOLN
INTRAMUSCULAR | Status: AC
  Administered 2011-07-10: 09:00:00
  Filled 2011-07-10: qty 3

## 2011-07-10 MED ORDER — HYDROMORPHONE 0.3 MG/ML IV SOLN
INTRAVENOUS | Status: AC
  Administered 2011-07-10: 2.1 mg
  Filled 2011-07-10: qty 25

## 2011-07-10 MED ORDER — CLINIMIX E/DEXTROSE (5/15) 5 % IV SOLN
INTRAVENOUS | Status: DC
  Administered 2011-07-10: 19:00:00 via INTRAVENOUS
  Filled 2011-07-10: qty 2000

## 2011-07-10 MED ORDER — METOPROLOL TARTRATE 25 MG PO TABS
25.0000 mg | ORAL_TABLET | Freq: Two times a day (BID) | ORAL | Status: DC
  Administered 2011-07-10 – 2011-07-16 (×11): 25 mg via ORAL
  Filled 2011-07-10 (×12): qty 1

## 2011-07-10 MED ORDER — PANTOPRAZOLE SODIUM 40 MG PO TBEC
40.0000 mg | DELAYED_RELEASE_TABLET | Freq: Two times a day (BID) | ORAL | Status: DC
  Administered 2011-07-10 – 2011-07-16 (×12): 40 mg via ORAL
  Filled 2011-07-10 (×12): qty 1

## 2011-07-10 NOTE — Consult Note (Signed)
PARENTERAL NUTRITION CONSULT NOTE   Pharmacy Consult for TPN Indication: s/p antrectomy  Allergies  Allergen Reactions  . Compazine Shortness Of Breath    Tongue swelling  . Phenergan Shortness Of Breath    Tongue swelling  . Vistaril (Hyzine) Shortness Of Breath    Tongue swelling  . Latex Swelling   Patient Measurements: Height: 5\' 4"  (162.6 cm) Weight: 154 lb 12.2 oz (70.2 kg) IBW/kg (Calculated) : 54.7   Vital Signs: Temp: 98.3 F (36.8 C) (04/20 0518) Temp src: Oral (04/20 0518) BP: 110/75 mmHg (04/20 0518) Pulse Rate: 96  (04/20 0518) Intake/Output from previous day: 04/19 0701 - 04/20 0700 In: 1634 [P.O.:240; I.V.:200; IV Piggyback:164; TPN:1030] Out: 3535 [Urine:2600; Drains:935] Intake/Output from this shift:    Labs:  [facility] 07/10/11 0359 07/09/11 0459 07/08/11 0458  WBC 19.9* 23.5* 25.3*  HGB 8.6* 8.3* 8.9*  HCT 26.9* 25.7* 26.6*  PLT 749* 537* 478*  APTT -- -- --  INR -- -- --    Basename 07/10/11 0359 07/09/11 0459 07/08/11 0458  NA 131* 133* 129*  K 3.6 3.5 3.8  CL 96 99 94*  CO2 28 28 27   GLUCOSE 103* 107* 114*  BUN 7 9 12   CREATININE 0.34* 0.35* 0.44*  LABCREA -- -- --  CREAT24HRUR -- -- --  CALCIUM 8.5 8.2* 8.2*  MG 2.0 2.2 1.9  PHOS 3.4 3.3 3.4  PROT 6.1 5.6* 5.5*  ALBUMIN 1.9* 1.7* 1.7*  AST 63* 47* 62*  ALT 53* 38* 39*  ALKPHOS 127* 104 109  BILITOT 1.6* 1.6* 1.9*  BILIDIR -- -- --  IBILI -- -- --  PREALBUMIN -- -- --  TRIG -- -- --  CHOLHDL -- -- --  CHOL -- -- --   Estimated Creatinine Clearance: 76.4 ml/min (by C-G formula based on Cr of 0.34).    Basename 07/10/11 0734 07/10/11 0429 07/10/11 0007  GLUCAP 110* 96 97   Medical History: Past Medical History  Diagnosis Date  . RA (rheumatoid arthritis)  2008  . MI (myocardial infarction) 2008    Not well documented, patient reports reassuring cardiac catheterization and stress testing in [place]  . Constipation 03/21/2011  . Hypokalemia 03/21/2011  . Fatty  liver 03/21/2011  . Pancreatitis     states 3 years ago, very severe, ?biliary pancreatitis   . T12 compression fracture 2011  . Duodenal ulcer     remote per patient. +BC powders, patient report negative EGD six months ago  . Dilated pancreatic duct     ?chronic pancreatitis, EUS pending (06/02/11)   Medications:  Scheduled:     . chlorhexidine  15 mL Mouth/Throat BID  . enoxaparin  40 mg Subcutaneous Q24H  . fluconazole (DIFLUCAN) IV  200 mg Intravenous Q24H  . HYDROmorphone PCA 0.3 mg/mL   Intravenous Q4H  . HYDROmorphone PCA 0.3 mg/mL      . insulin aspart  0-9 Units Subcutaneous Q4H  . metoprolol tartrate  25 mg Oral BID  . ondansetron (ZOFRAN) IV  4 mg Intravenous TID AC  . pantoprazole  40 mg Oral BID AC  . piperacillin-tazobactam (ZOSYN)  IV  3.375 g Intravenous Q8H  . sodium chloride  10-40 mL Intracatheter Q12H  . sodium chloride      . DISCONTD: magnesium citrate  1 Bottle Oral Once  . DISCONTD: metoprolol  5 mg Intravenous Q8H  . DISCONTD: pantoprazole (PROTONIX) IV  40 mg Intravenous BID AC   Insulin Requirements in the past 24 hours:  n/a  Current Nutrition:  TPN at 68ml/hr  Assessment: Estimated Creatinine Clearance: 76.4 ml/min (by C-G formula based on Cr of 0.34). Advancing diet to soft Calcium corrects to WNL due to low albumin Hypoalbuminemia (not improving)  Nutritional Goals per RD: Estimated Nutritional Needs:  Kcal:1725-2070 kcal per day  Protein:104-117 grams per day  Fluid:1 ml per kcal  Plan: TPN 57ml/hr today (advancing diet) Rate can be reduced to 20 ml/hr if MD requests further tapering Anticipate D/C TPN tomorrow No lipids or trace elements with TPN due to LFTs  (has been discussed with Dr. Lovell Sheehan)  Raquel James, Rorie Delmore Bennett 07/10/2011,10:50 AM

## 2011-07-10 NOTE — Progress Notes (Signed)
12 Days Post-Op  Subjective: No new complaints.  Objective: Vital signs in last 24 hours: Temp:  [98.3 F (36.8 C)-99.5 F (37.5 C)] 98.3 F (36.8 C) (04/20 0518) Pulse Rate:  [90-102] 96  (04/20 0518) Resp:  [16-24] 16  (04/20 0815) BP: (110-129)/(73-86) 110/75 mmHg (04/20 0518) SpO2:  [95 %-98 %] 96 % (04/20 0815) FiO2 (%):  [96 %] 96 % (04/20 0459) Weight:  [70.2 kg (154 lb 12.2 oz)] 70.2 kg (154 lb 12.2 oz) (04/20 0500) Last BM Date: 07/09/11  Intake/Output from previous day: 04/19 0701 - 04/20 0700 In: 1634 [P.O.:240; I.V.:200; IV Piggyback:164; TPN:1030] Out: 3535 [Urine:2600; Drains:935] Intake/Output this shift:    General appearance: cooperative Resp: clear to auscultation bilaterally Cardio: regular rate and rhythm, S1, S2 normal, no murmur, click, rub or gallop GI: Soft with intermittent nonspecific tenderness. Incisions healing well. JP drainage bilious in nature  Lab Results:   Basename 07/10/11 0359 07/09/11 0459  WBC 19.9* 23.5*  HGB 8.6* 8.3*  HCT 26.9* 25.7*  PLT 749* 537*   BMET  Basename 07/10/11 0359 07/09/11 0459  NA 131* 133*  K 3.6 3.5  CL 96 99  CO2 28 28  GLUCOSE 103* 107*  BUN 7 9  CREATININE 0.34* 0.35*  CALCIUM 8.5 8.2*   PT/INR No results found for this basename: LABPROT:2,INR:2 in the last 72 hours  Studies/Results: No results found.  Anti-infectives: Anti-infectives     Start     Dose/Rate Route Frequency Ordered Stop   07/06/11 1000  piperacillin-tazobactam (ZOSYN) IVPB 3.375 g       3.375 g 12.5 mL/hr over 240 Minutes Intravenous Every 8 hours 07/06/11 0759     06/30/11 0900   fluconazole (DIFLUCAN) IVPB 200 mg        200 mg 100 mL/hr over 60 Minutes Intravenous Every 24 hours 06/30/11 0807     06/29/11 1300   ertapenem (INVANZ) 1 g in sodium chloride 0.9 % 50 mL IVPB        1 g 100 mL/hr over 30 Minutes Intravenous Every 24 hours 06/28/11 2007 06/29/11 1416   06/28/11 1330   ertapenem (INVANZ) 1 g in sodium  chloride 0.9 % 50 mL IVPB  Status:  Discontinued        1 g 100 mL/hr over 30 Minutes Intravenous Every 24 hours 06/28/11 1315 06/28/11 1840   06/28/11 1309   sodium chloride 0.9 % with ertapenem [facility]) ADS Med  Status:  Discontinued     Comments: BELL, ANITRA: cabinet override         06/28/11 1309 06/28/11 1310          Assessment/Plan: s/p Procedure(s): ANTRECTOMY GASTROJEJUNOSTOMY Impression: Leukocytosis is slowly resolving. She is advanced on to a low-fat diet. I am weaning the TPN at this point. She still has a controlled bile leak. Will switch some of her medications to by mouth.  LOS: 20 days    Adolf Ormiston A 07/10/2011

## 2011-07-11 LAB — GLUCOSE, CAPILLARY
Glucose-Capillary: 117 mg/dL — ABNORMAL HIGH (ref 70–99)
Glucose-Capillary: 110 mg/dL — ABNORMAL HIGH (ref 70–99)
Glucose-Capillary: 113 mg/dL — ABNORMAL HIGH (ref 70–99)
Glucose-Capillary: 88 mg/dL (ref 70–99)
Glucose-Capillary: 92 mg/dL (ref 70–99)

## 2011-07-11 LAB — COMPREHENSIVE METABOLIC PANEL
ALT: 57 U/L — ABNORMAL HIGH (ref 0–35)
Albumin: 2 g/dL — ABNORMAL LOW (ref 3.5–5.2)
Alkaline Phosphatase: 144 U/L — ABNORMAL HIGH (ref 39–117)
BUN: 7 mg/dL (ref 6–23)
Chloride: 97 mEq/L (ref 96–112)
GFR calc Af Amer: >90 mL/min (ref >90)
Glucose, Bld: 114 mg/dL — ABNORMAL HIGH (ref 70–99)
Potassium: 3.6 mEq/L (ref 3.5–5.1)
Sodium: 133 mEq/L — ABNORMAL LOW (ref 135–145)
Total Bilirubin: 1.6 mg/dL — ABNORMAL HIGH (ref 0.3–1.2)
Total Protein: 6.3 g/dL (ref 6.0–8.3)

## 2011-07-11 LAB — CBC
Hemoglobin: 8.6 g/dL — ABNORMAL LOW (ref 12.0–15.0)
MCH: 28 pg (ref 26.0–34.0)
MCHC: 32 g/dL (ref 30.0–36.0)
MCV: 87.6 fL (ref 78.0–100.0)
RBC: 3.07 MIL/uL — ABNORMAL LOW (ref 3.87–5.11)

## 2011-07-11 LAB — MAGNESIUM: Magnesium: 2 mg/dL (ref 1.5–2.5)

## 2011-07-11 MED ORDER — HYDROMORPHONE 0.3 MG/ML IV SOLN
INTRAVENOUS | Status: AC
  Administered 2011-07-11: 02:00:00
  Filled 2011-07-11: qty 25

## 2011-07-11 MED ORDER — SODIUM CHLORIDE 0.9 % IJ SOLN
INTRAMUSCULAR | Status: AC
  Administered 2011-07-11: 10 mL
  Filled 2011-07-11: qty 3

## 2011-07-11 MED ORDER — HYDROMORPHONE 0.3 MG/ML IV SOLN
INTRAVENOUS | Status: AC
  Filled 2011-07-11: qty 25

## 2011-07-11 MED ORDER — HYDROMORPHONE 0.3 MG/ML IV SOLN
INTRAVENOUS | Status: AC
  Administered 2011-07-11: 19:00:00
  Administered 2011-07-11: 3.3 mg
  Filled 2011-07-11: qty 25

## 2011-07-11 NOTE — Progress Notes (Signed)
Patient c/o increased pain in right lower quadrant during the night, specifically around JP drain.  PCA was encouraged and other meds given per previous MD order.  Vital signs remained stable and patient stable.  Will notify AM nurse.

## 2011-07-11 NOTE — Progress Notes (Signed)
13 Days Post-Op  Subjective: Still having intermittent abdominal pain. He is tolerating by mouth well.  Objective: Vital signs in last 24 hours: Temp:  [98.1 F (36.7 C)-98.5 F (36.9 C)] 98.1 F (36.7 C) (04/21 1039) Pulse Rate:  [83-98] 88  (04/21 1039) Resp:  [18-22] 20  (04/21 1039) BP: (106-120)/(71-82) 114/77 mmHg (04/21 1039) SpO2:  [95 %-99 %] 97 % (04/21 1039) Last BM Date: 07/09/11  Intake/Output from previous day: 04/20 0701 - 04/21 0700 In: 1780 [P.O.:200; IV Piggyback:50; TPN:880] Out: 1970 [Urine:1350; Drains:620] Intake/Output this shift: Total I/O In: 120 [P.O.:120] Out: 490 [Urine:350; Drains:140]  General appearance: cooperative and no distress Resp: clear to auscultation bilaterally Cardio: regular rate and rhythm, S1, S2 normal, no murmur, click, rub or gallop GI: Soft. Incision healing well. JP drainage is still bilious in nature, though lighter in green color.  Lab Results:   [facility] 07/11/11 0650 07/10/11 0359  WBC 14.2* 19.9*  HGB 8.6* 8.6*  HCT 26.9* 26.9*  PLT 867* 749*   BMET  Basename 07/11/11 0650 07/10/11 0359  NA 133* 131*  K 3.6 3.6  CL 97 96  CO2 29 28  GLUCOSE 114* 103*  BUN 7 7  CREATININE 0.45* 0.34*  CALCIUM 9.0 8.5   PT/INR No results found for this basename: LABPROT:2,INR:2 in the last 72 hours  Studies/Results: No results found.  Anti-infectives: Anti-infectives     Start     Dose/Rate Route Frequency Ordered Stop   07/06/11 1000  piperacillin-tazobactam (ZOSYN) IVPB 3.375 g       3.375 g 12.5 mL/hr over 240 Minutes Intravenous Every 8 hours 07/06/11 0759     06/30/11 0900   fluconazole (DIFLUCAN) IVPB 200 mg        200 mg 100 mL/hr over 60 Minutes Intravenous Every 24 hours 06/30/11 0807     06/29/11 1300   ertapenem (INVANZ) 1 g in sodium chloride 0.9 % 50 mL IVPB        1 g 100 mL/hr over 30 Minutes Intravenous Every 24 hours 06/28/11 2007 06/29/11 1416   06/28/11 1330   ertapenem (INVANZ) 1 g in sodium  chloride 0.9 % 50 mL IVPB  Status:  Discontinued        1 g 100 mL/hr over 30 Minutes Intravenous Every 24 hours 06/28/11 1315 06/28/11 1840   06/28/11 1309   sodium chloride 0.9 % with ertapenem [facility]) ADS Med  Status:  Discontinued     Comments: BELL, ANITRA: cabinet override         06/28/11 1309 06/28/11 1310          Assessment/Plan: s/p Procedure(s): ANTRECTOMY GASTROJEJUNOSTOMY Impression: Patient overall is slowly improving. She still has a bile leak, though her bilirubin is improved. Her leukocytosis is resolving remarkably. Plan: We will remove Foley catheter. Will stop TPN at this point. Will continue to monitor by mouth intake.  LOS: 21 days    Lainie Daubert A 07/11/2011

## 2011-07-11 NOTE — Progress Notes (Signed)
Patient complains of pain in her abdomen that is not being relieved with medication. Paged Dr. Lovell Sheehan.

## 2011-07-11 NOTE — Progress Notes (Signed)
Patient went from bed to chair and experienced lots of pain, but was able to ambulate with little assistance.

## 2011-07-11 NOTE — Progress Notes (Signed)
Patient has not voided since foley removal around 1100. Sat patient on bed pan with no results. Patient stated she feels like she has urine but cannot get it out. Bladder scanned patient and scanner shows 188cc. E-mail paged Dr. Lovell Sheehan. Will continue to monitor.

## 2011-07-11 NOTE — Progress Notes (Signed)
Dr. Lovell Sheehan ordered In and out cath and 250cc urine emptied. Will continue to monitor.

## 2011-07-12 LAB — CBC
HCT: 28.1 % — ABNORMAL LOW (ref 36.0–46.0)
Hemoglobin: 8.9 g/dL — ABNORMAL LOW (ref 12.0–15.0)
MCHC: 31.7 g/dL (ref 30.0–36.0)
RBC: 3.22 MIL/uL — ABNORMAL LOW (ref 3.87–5.11)
WBC: 13.2 10*3/uL — ABNORMAL HIGH (ref 4.0–10.5)

## 2011-07-12 LAB — DIFFERENTIAL
Basophils Relative: 1 % (ref 0–1)
Lymphocytes Relative: 12 % (ref 12–46)
Monocytes Relative: 8 % (ref 3–12)
Neutro Abs: 10 10*3/uL — ABNORMAL HIGH (ref 1.7–7.7)
Neutrophils Relative %: 76 % (ref 43–77)

## 2011-07-12 LAB — COMPREHENSIVE METABOLIC PANEL
AST: 69 U/L — ABNORMAL HIGH (ref 0–37)
Albumin: 2.2 g/dL — ABNORMAL LOW (ref 3.5–5.2)
BUN: 6 mg/dL (ref 6–23)
Calcium: 9.3 mg/dL (ref 8.4–10.5)
Creatinine, Ser: 0.43 mg/dL — ABNORMAL LOW (ref 0.50–1.10)
Total Bilirubin: 1.9 mg/dL — ABNORMAL HIGH (ref 0.3–1.2)
Total Protein: 6.7 g/dL (ref 6.0–8.3)

## 2011-07-12 LAB — CHOLESTEROL, TOTAL: Cholesterol: 97 mg/dL (ref 0–200)

## 2011-07-12 LAB — PREALBUMIN: Prealbumin: 9.9 mg/dL — ABNORMAL LOW (ref 17.0–34.0)

## 2011-07-12 LAB — TRIGLYCERIDES: Triglycerides: 97 mg/dL (ref <150)

## 2011-07-12 LAB — MAGNESIUM: Magnesium: 2 mg/dL (ref 1.5–2.5)

## 2011-07-12 LAB — PHOSPHORUS: Phosphorus: 4.3 mg/dL (ref 2.3–4.6)

## 2011-07-12 LAB — GLUCOSE, CAPILLARY: Glucose-Capillary: 105 mg/dL — ABNORMAL HIGH (ref 70–99)

## 2011-07-12 MED ORDER — HYDROMORPHONE 0.3 MG/ML IV SOLN
INTRAVENOUS | Status: AC
  Administered 2011-07-12: 0.3 mg
  Filled 2011-07-12: qty 25

## 2011-07-12 MED ORDER — HYDROMORPHONE 0.3 MG/ML IV SOLN
INTRAVENOUS | Status: AC
  Filled 2011-07-12: qty 25

## 2011-07-12 MED ORDER — ENSURE COMPLETE PO LIQD: 237.0000 mL | Freq: Two times a day (BID) | ORAL | Status: DC

## 2011-07-12 MED ORDER — BOOST / RESOURCE BREEZE PO LIQD
1.0000 | Freq: Two times a day (BID) | ORAL | Status: DC
  Administered 2011-07-12 – 2011-07-16 (×7): 1 via ORAL
  Filled 2011-07-12 (×15): qty 1

## 2011-07-12 MED ORDER — OXYCODONE-ACETAMINOPHEN 5-325 MG PO TABS
1.0000 | ORAL_TABLET | ORAL | Status: DC | PRN
  Administered 2011-07-12 (×2): 2 via ORAL
  Administered 2011-07-13 – 2011-07-14 (×3): 1 via ORAL
  Filled 2011-07-12: qty 1
  Filled 2011-07-12: qty 2
  Filled 2011-07-12: qty 1
  Filled 2011-07-12 (×2): qty 2
  Filled 2011-07-12 (×2): qty 1

## 2011-07-12 NOTE — Progress Notes (Signed)
md paged at [phone] to report critical lab results. No return call at this time

## 2011-07-12 NOTE — Progress Notes (Signed)
Nutrition Follow-up  Pt TPN has been d/c now and she is tol oral intake.  Diet Order: Fat Modified- last recorded meal intake 75%  Meds: Scheduled Meds:   . chlorhexidine  15 mL Mouth/Throat BID  . enoxaparin  40 mg Subcutaneous Q24H  . fluconazole (DIFLUCAN) IV  200 mg Intravenous Q24H  . HYDROmorphone PCA 0.3 mg/mL   Intravenous Q4H  . HYDROmorphone PCA 0.3 mg/mL      . HYDROmorphone PCA 0.3 mg/mL      . HYDROmorphone PCA 0.3 mg/mL      . insulin aspart  0-9 Units Subcutaneous Q4H  . metoprolol tartrate  25 mg Oral BID  . ondansetron (ZOFRAN) IV  4 mg Intravenous TID AC  . pantoprazole  40 mg Oral BID AC  . piperacillin-tazobactam (ZOSYN)  IV  3.375 g Intravenous Q8H  . sodium chloride  10-40 mL Intracatheter Q12H  . sodium chloride       Continuous Infusions:   . sodium chloride 25 mL/hr at 07/10/11 0112  . DISCONTD: TPN (CLINIMIX) +/- additives 40 mL/hr at 07/10/11 1835   PRN Meds:.acetaminophen, diphenhydrAMINE, diphenhydrAMINE, HYDROmorphone (DILAUDID) injection, LORazepam, naloxone, ondansetron (ZOFRAN) IV, phenol, sodium chloride, sodium chloride, sodium chloride  Labs:  CMP     Component Value Date/Time   NA 130* 07/12/2011 0535   K 3.8 07/12/2011 0535   CL 92* 07/12/2011 0535   CO2 30 07/12/2011 0535   GLUCOSE 95 07/12/2011 0535   BUN 6 07/12/2011 0535   CREATININE 0.43* 07/12/2011 0535   CALCIUM 9.3 07/12/2011 0535   PROT 6.7 07/12/2011 0535   ALBUMIN 2.2* 07/12/2011 0535   AST 69* 07/12/2011 0535   ALT 65* 07/12/2011 0535   ALKPHOS 188* 07/12/2011 0535   BILITOT 1.9* 07/12/2011 0535   GFRNONAA >90 07/12/2011 0535   GFRAA >90 07/12/2011 0535     Intake/Output Summary (Last 24 hours) at 07/12/11 0839 Last data filed at 07/12/11 4098  Gross per 24 hour  Intake 2568.33 ml  Output   2250 ml  Net 318.33 ml      Weight Status:  154.12# reflects wt gain 5#, 3% x 6d  Re-estimated needs:   1540-1960 Kcal per day 91-105 grams per day   Nutrition Dx: Inadequate  oral intake improving.  Goal:Pt will  tol jFat Modified diet and consume adequate nutr to meet >80% of est nutritional needs.   Intervention:  Add Ensure Complete BID between meals (350 kcal, 13 gr protein)  Monitor:  Tol of oral intake and percentage of meals and supplements consumed, wt trends and labs   Francene Boyers Pager [phone]

## 2011-07-12 NOTE — Evaluation (Signed)
Physical Therapy Evaluation Patient Details Name: Jordan Haney MRN: [id number] DOB: [birth year] Today's Date: 07/12/2011 Time: 1135-1200 PT Time Calculation (min): 25 min  PT Assessment / Plan / Recommendation Clinical Impression  Pt with decreased activity tolerance who will benefit from skilled PT to improve functional level    PT Assessment  Patient needs continued PT services    Follow Up Recommendations  No PT follow up (Pt has attendant who will be able to assist pt with mobility) Pt states prior level attendant assists pt out of bed at home.    Equipment Recommendations  None recommended by PT    Frequency Min 3X/week    Precautions / Restrictions Precautions Precautions: Fall Restrictions Weight Bearing Restrictions: No   Pertinent Vitals/Pain 10/10 nurse informed and medicated pt while therapist was there.     Mobility  Bed Mobility Bed Mobility: Rolling Right;Right Sidelying to Sit;Sitting - Scoot to Edge of Bed Rolling Right: 6: Modified independent (Device/Increase time) Right Sidelying to Sit: 4: Min assist Sitting - Scoot to Edge of Bed: 6: Modified independent (Device/Increase time) Transfers Transfers: Sit to Stand Sit to Stand: 5: Supervision Ambulation/Gait Ambulation/Gait Assistance: 5: Supervision Ambulation Distance (Feet): 30 Feet Assistive device: Rolling walker Gait Pattern: Trunk flexed Gait velocity: slow Stairs: No Wheelchair Mobility Wheelchair Mobility: No    Exercises  pt refused.   PT Goals Acute Rehab PT Goals PT Goal Formulation: With patient Pt will Ambulate: 16 - 50 feet;with modified independence PT Goal: Ambulate - Progress: Goal set today Pt will Perform Home Exercise Program: with supervision, verbal cues required/provided PT Goal: Perform Home Exercise Program - Progress: Goal set today  Visit Information  Last PT Received On: 07/12/11 Assistance Needed: +1 Reason Eval/Treat Not Completed:  (initally patient  states she will not get up.  Nurse convince pt to get up if she received pain medication prior to getting up.    Subjective Data  Subjective: I am not getting up until I get pain medication   Prior Functioning  Home Living Lives With: Significant other Available Help at Discharge: Personal care attendant Type of Home: House Home Access: Stairs to enter [employer] of Steps: 2 Entrance Stairs-Rails: Right Home Layout: Able to live on main level with bedroom/bathroom Bathroom Shower/Tub: Engineer, manufacturing systems: Standard Home Adaptive Equipment: Walker - rolling Prior Function Level of Independence: Independent with assistive device(s);Needs assistance (has attendant with her 5 days/week) Able to Take Stairs?: Yes Communication Communication: No difficulties    Cognition  Overall Cognitive Status: Appears within functional limits for tasks assessed/performed Arousal/Alertness: Awake/alert Orientation Level: Appears intact for tasks assessed Behavior During Session: [facility] for tasks performed    Extremity/Trunk Assessment Right Upper Extremity Assessment RUE ROM/Strength/Tone: [facility] for tasks assessed Left Upper Extremity Assessment LUE ROM/Strength/Tone: [facility] for tasks assessed Right Lower Extremity Assessment RLE ROM/Strength/Tone: [facility] for tasks assessed Left Lower Extremity Assessment LLE ROM/Strength/Tone: [facility] for tasks assessed   Balance Balance Balance Assessed: No  End of Session PT - End of Session Equipment Utilized During Treatment: Gait belt Activity Tolerance: Patient tolerated treatment well Patient left: in bed;with call bell/phone within reach;with family/visitor present Nurse Communication: Mobility status   Alisi Lupien,CINDY 07/12/2011, 12:10 PM

## 2011-07-12 NOTE — Progress Notes (Signed)
   CARE MANAGEMENT NOTE 07/12/2011  Patient:  Jordan Haney, Jordan Haney   Account Number:  [id number]  Date Initiated:  06/21/2011  Documentation initiated by:  Rosemary Holms  Subjective/Objective Assessment:   Pt admitted with abdominal pain. PTA independent with ADL.     Action/Plan:   Spoke to pt at bedside. No HH needs identified at this time.   Anticipated DC Date:  06/30/2011   Anticipated DC Plan:  HOME W HOME HEALTH SERVICES      DC Planning Services  CM consult      Choice offered to / List presented to:  C-1 Patient        HH arranged  HH-1 RN  HH-10 DISEASE MANAGEMENT  HH-2 PT  HH-4 NURSE'S AIDE      HH agency  [facility]   Status of service:  In process, will continue to follow Medicare Important Message given?   (If response is "NO", the following Medicare IM given date fields will be blank) Date Medicare IM given:   Date Additional Medicare IM given:    Discharge Disposition:    Per UR Regulation:    If discussed at Long Length of Stay Meetings, dates discussed:   07/01/2011  07/08/2011    Comments:  07/12/11 1530 Anibal Henderson RN Pt improving. TNA has been D/C and pt eating.  Per MD plan to is to decrease and then chg PCA to po, PT to encourage ambulation, and plan for D/C later this week with [facility] to follow. Pt will D/C with drains. Pt states she has her fiance, who will assist with her care at home  06/30/11 1505 Arlyss Queen, RN BSN CM Pt having PICC line insterted on today and wil start TNA. Spoke with pts surgeon and he agrees that LTAC is a potential discharge plan. Pt will be ready toward the end of next week for possible discharge to LTAC. MD stated that pt needs more testing this week to make sure that there are no complications of surgery. Will follow and make referrals when MD gives the order for LTAC.  06/25/11 1000 Amy Robson RN BSN CM Pt appears to be proceeded to surgery on monday. CM will follow postoperatively for [facility] DME needs  06/21/11  1000 Amy Leanord Hawking RN BSN

## 2011-07-13 LAB — GLUCOSE, CAPILLARY
Glucose-Capillary: 102 mg/dL — ABNORMAL HIGH (ref 70–99)
Glucose-Capillary: 104 mg/dL — ABNORMAL HIGH (ref 70–99)
Glucose-Capillary: 106 mg/dL — ABNORMAL HIGH (ref 70–99)
Glucose-Capillary: 116 mg/dL — ABNORMAL HIGH (ref 70–99)
Glucose-Capillary: 121 mg/dL — ABNORMAL HIGH (ref 70–99)
Glucose-Capillary: 96 mg/dL (ref 70–99)

## 2011-07-13 LAB — COMPREHENSIVE METABOLIC PANEL
ALT: 61 U/L — ABNORMAL HIGH (ref 0–35)
AST: 66 U/L — ABNORMAL HIGH (ref 0–37)
Albumin: 2.1 g/dL — ABNORMAL LOW (ref 3.5–5.2)
CO2: 29 mEq/L (ref 19–32)
Calcium: 9.2 mg/dL (ref 8.4–10.5)
Chloride: 95 mEq/L — ABNORMAL LOW (ref 96–112)
GFR calc non Af Amer: >90 mL/min (ref >90)
Sodium: 134 mEq/L — ABNORMAL LOW (ref 135–145)
Total Bilirubin: 1.3 mg/dL — ABNORMAL HIGH (ref 0.3–1.2)

## 2011-07-13 LAB — CBC
HCT: 28.3 % — ABNORMAL LOW (ref 36.0–46.0)
Hemoglobin: 8.9 g/dL — ABNORMAL LOW (ref 12.0–15.0)
MCH: 27.6 pg (ref 26.0–34.0)
MCHC: 31.4 g/dL (ref 30.0–36.0)
MCV: 87.6 fL (ref 78.0–100.0)
Platelets: 1076 10*3/uL (ref 150–400)
RBC: 3.23 MIL/uL — ABNORMAL LOW (ref 3.87–5.11)
RDW: 17.3 % — ABNORMAL HIGH (ref 11.5–15.5)
WBC: 12.5 10*3/uL — ABNORMAL HIGH (ref 4.0–10.5)

## 2011-07-13 MED ORDER — CELECOXIB 100 MG PO CAPS
200.0000 mg | ORAL_CAPSULE | Freq: Two times a day (BID) | ORAL | Status: DC
  Administered 2011-07-13 – 2011-07-15 (×4): 200 mg via ORAL
  Filled 2011-07-13 (×13): qty 2

## 2011-07-13 MED ORDER — DIAZEPAM 5 MG PO TABS
5.0000 mg | ORAL_TABLET | Freq: Three times a day (TID) | ORAL | Status: DC | PRN
  Administered 2011-07-14 – 2011-07-16 (×3): 5 mg via ORAL
  Filled 2011-07-13 (×3): qty 1

## 2011-07-13 MED ORDER — GABAPENTIN 100 MG PO CAPS
100.0000 mg | ORAL_CAPSULE | Freq: Two times a day (BID) | ORAL | Status: DC
  Administered 2011-07-13 – 2011-07-16 (×4): 100 mg via ORAL
  Filled 2011-07-13 (×4): qty 1

## 2011-07-13 MED ORDER — ADALIMUMAB 40 MG/0.8ML ~~LOC~~ KIT: 40.0000 mg | PACK | SUBCUTANEOUS | Status: DC

## 2011-07-13 MED ORDER — QUETIAPINE FUMARATE 100 MG PO TABS
100.0000 mg | ORAL_TABLET | Freq: Two times a day (BID) | ORAL | Status: DC
  Administered 2011-07-13 – 2011-07-14 (×3): 100 mg via ORAL
  Filled 2011-07-13 (×4): qty 1

## 2011-07-13 MED ORDER — AMLODIPINE BESYLATE 5 MG PO TABS
5.0000 mg | ORAL_TABLET | Freq: Every day | ORAL | Status: DC
  Administered 2011-07-13 – 2011-07-16 (×4): 5 mg via ORAL
  Filled 2011-07-13 (×4): qty 1

## 2011-07-13 MED ORDER — ASPIRIN 325 MG PO TABS
325.0000 mg | ORAL_TABLET | Freq: Every day | ORAL | Status: DC
  Administered 2011-07-13 – 2011-07-16 (×4): 325 mg via ORAL
  Filled 2011-07-13 (×4): qty 1

## 2011-07-13 NOTE — Progress Notes (Signed)
Patient was medicated with Dilaudid PRN 1mg  for abdominal pain.  With the assistance of PT, Patient ambulated 100 feet down the hall and back to her room.  She is now sitting in her chair and states relief from pain.

## 2011-07-13 NOTE — Progress Notes (Signed)
Physical Therapy Treatment Patient Details Name: Jordan Haney MRN: [id number] DOB: 09-02-1955 Today's Date: 07/13/2011 Time: 4098-1191 PT Time Calculation (min): 19 min  PT Assessment / Plan / Recommendation Comments on Treatment Session  Pt was able to complete 100 feet of gait training with Min A;pt did have complaint of feeling like she was going to faint at 61' mark,however pt was able to ambulate back to recliner without incident. Patient was given pain med via IV prior to therapy    Follow Up Recommendations       Equipment Recommendations       Frequency     Plan      Precautions / Restrictions Restrictions Weight Bearing Restrictions: No   Pertinent Vitals/Pain     Mobility  Bed Mobility Rolling Right: 6: Modified independent (Device/Increase time) Sitting - Scoot to Edge of Bed: 6: Modified independent (Device/Increase time) Transfers Sit to Stand: 5: Supervision Ambulation/Gait Ambulation Distance (Feet): 100 Feet Assistive device: Rolling walker Ambulation/Gait Assistance Details: verbal cues for thoracic extension  Gait Pattern: Trunk flexed;Antalgic Gait velocity: extremely slow General Gait Details: shuffle    Exercises Other Exercises Other Exercises: pt refused exercises only agreeable to gait   PT Goals Acute Rehab PT Goals PT Goal: Ambulate - Progress: Progressing toward goal PT Goal: Perform Home Exercise Program - Progress: Not met  Visit Information  Last PT Received On: 07/13/11    Subjective Data      Cognition       Balance     End of Session PT - End of Session Equipment Utilized During Treatment: Gait belt Activity Tolerance: Patient tolerated treatment well;Patient limited by pain Patient left: in chair;with bed alarm set;with nursing in room    Naamah Boggess ATKINSO 07/13/2011, 11:51 AM

## 2011-07-14 LAB — CBC
MCH: 27.3 pg (ref 26.0–34.0)
MCV: 87 fL (ref 78.0–100.0)
Platelets: 968 10*3/uL (ref 150–400)
RDW: 17 % — ABNORMAL HIGH (ref 11.5–15.5)

## 2011-07-14 LAB — GLUCOSE, CAPILLARY
Glucose-Capillary: 104 mg/dL — ABNORMAL HIGH (ref 70–99)
Glucose-Capillary: 91 mg/dL (ref 70–99)

## 2011-07-14 LAB — COMPREHENSIVE METABOLIC PANEL
ALT: 76 U/L — ABNORMAL HIGH (ref 0–35)
Alkaline Phosphatase: 254 U/L — ABNORMAL HIGH (ref 39–117)
CO2: 30 mEq/L (ref 19–32)
Chloride: 99 mEq/L (ref 96–112)
GFR calc Af Amer: >90 mL/min (ref >90)
GFR calc non Af Amer: >90 mL/min (ref >90)
Glucose, Bld: 89 mg/dL (ref 70–99)
Potassium: 2.8 mEq/L — ABNORMAL LOW (ref 3.5–5.1)
Sodium: 139 mEq/L (ref 135–145)

## 2011-07-14 MED ORDER — OXYCODONE-ACETAMINOPHEN 5-325 MG PO TABS
1.0000 | ORAL_TABLET | ORAL | Status: DC | PRN
  Administered 2011-07-14 – 2011-07-16 (×11): 2 via ORAL
  Administered 2011-07-16: 1 via ORAL
  Administered 2011-07-16 (×2): 2 via ORAL
  Filled 2011-07-14 (×4): qty 2
  Filled 2011-07-14: qty 1
  Filled 2011-07-14 (×8): qty 2

## 2011-07-14 MED ORDER — POTASSIUM CHLORIDE CRYS ER 20 MEQ PO TBCR
40.0000 meq | EXTENDED_RELEASE_TABLET | Freq: Once | ORAL | Status: AC
  Administered 2011-07-14: 40 meq via ORAL
  Filled 2011-07-14: qty 2

## 2011-07-14 NOTE — Progress Notes (Signed)
Pts kcl is 2.8, paged md to notify.

## 2011-07-14 NOTE — Progress Notes (Signed)
Pain is worse although patient states she has not been using PO pain meds.  No nausea.  Normal BM.  Tolerating PO.  Vitals: Stable.  PERR, EOMI CTA bilaterally, full breath sounds Borderline tachycardia, reg rhythm Abdomin soft, moderate/severe RUQ pain.  No diffuse peritoneal signs.  JP bilious  POD 15. 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 Encouraged PO pain medication usage.  Increase activity.  Hopeful d/c next 2-3 days.

## 2011-07-14 NOTE — Progress Notes (Signed)
Subjective: Interval History: has complaints pain.  Still with significant pain but mostly controlled.  No fevers.  Tolerating diet.  Objective: Vitals stable.  Intake/Output this shift:  JP 70/80/50   HEENT:  PERRL, EOMI CVS:  Stable, RRR. Pulm:  CTA, full breath sounds, GI:  Soft, moderate RUQ pain.  No diffuse peritoneal signs.  JP bilious.  WBC:  13.2 Platelets >1000  Studies/Results: Ct Abdomen Pelvis W Contrast  07/06/2011  *RADIOLOGY REPORT*  Clinical Data: Recently postop from gastric antrectomy and gastrojejunostomy.  Peptic ulcer disease.  Acute pancreatitis. Nausea.  CT ABDOMEN AND PELVIS WITH CONTRAST  Technique:  Multidetector CT imaging of the abdomen and pelvis was performed following the standard protocol during bolus administration of intravenous contrast.  Contrast: OMNIPAQUE IOHEXOL 300 MG/ML  SOLN  Comparison: 05/10/2011  Findings: New right lower lobe atelectasis and small to moderate right pleural effusion are demonstrated.  A tiny amount of free air is seen which is attributable to to the recent surgery.  Mild to moderate ascites is new since previous study.  A surgical drain is seen in Morison's pouch. Recent postop changes are seen from gastric antrectomy and gastrojejunostomy. The surgical clips are again seen from prior cholecystectomy, and biliary ductal dilatation is unchanged.  Moderate severe dilatation of small bowel is seen, however contrast has passed into the colon. Wall thickening is seen involving the ascending colon and hepatic flexure, likely due to postoperative change, however there is no evidence of bowel obstruction.  This is most consistent with a postop ileus.  A fluid collection with early peripheral enhancement is seen in the right lower quadrant anterior to the psoas which measures 4.2 x 6.4 cm.  This is new and could represent a pseudocyst, abscess, or other postoperative fluid collection.  A Foley catheter is seen within the bladder which is  decompressed.  No soft tissue masses or lymphadenopathy identified within the abdomen or pelvis. Hepatic steatosis and small left upper pole renal cyst remains stable.  No other abdominal parenchymal organs are unremarkable.  No evidence of hydronephrosis.  Prior hysterectomy noted.  IMPRESSION:  1.  Recent postoperative changes from gastric antrectomy and gastrojejunostomy.  Probable severe postop ileus. 2.  4 x 6 cm right lower quadrant fluid collection anterior to the psoas muscle and iliac vessels, which may represent a postoperative fluid collection, abscess, or pseudocyst. 3.  Moderate ascites, small right pleural effusion, and bibasilar atelectasis. 4.  Stable hepatic steatosis.  Original Report Authenticated By: Danae Orleans, M.D.   Dg Chest Port 1 View  07/03/2011  *RADIOLOGY REPORT*  Clinical Data: Persistent fever and leukocytosis status post recent abdominal surgery.  PORTABLE CHEST - 1 VIEW  Comparison: 06/30/2011.  Findings: 1024 hours.  Nasogastric tube projects below the diaphragm.  Right arm PICC is unchanged near the cavoatrial junction.  There are lower lung volumes with increasing pleural effusions and bibasilar air space opacities.  No pneumothorax is seen.  The heart size and mediastinal contours are stable.  IMPRESSION: Worsening pleural effusions and bibasilar air space opacities.  Original Report Authenticated By: Gerrianne Scale, M.D.   Dg Chest Port 1 View  06/30/2011  *RADIOLOGY REPORT*  Clinical Data: Confirm line placement.  PORTABLE CHEST - 1 VIEW  Comparison: None.  Findings: Right PICC line is in place with the tip at the cavoatrial junction.  NG tube is seen in the stomach.  Bibasilar atelectasis.  Heart is normal size.  No effusions.  IMPRESSION: Bibasilar atelectasis.  Right  PICC line tip at the cavoatrial junction.  Original Report Authenticated By: Cyndie Chime, M.D.   Dg Abd Acute W/chest  06/20/2011  *RADIOLOGY REPORT*  Clinical Data: Abdominal pain  ACUTE  ABDOMEN SERIES (ABDOMEN 2 VIEW & CHEST 1 VIEW)  Comparison: 06/08/2011  Findings: Heart size appears normal.  No pleural effusion or edema.  No airspace consolidation identified.  Cholecystectomy clips are noted within the right upper quadrant of the abdomen.  There is barium within the colon. Presumably,  this is from the upper GI performed 06/08/2011.  No abnormally dilated loops of small bowel identified.  There is no air-fluid levels identified.  No evidence for free air.  IMPRESSION:  1.  No acute cardiopulmonary abnormalities. 2.  Nonobstructive bowel gas pattern.  Original Report Authenticated By: Rosealee Albee, M.D.   Dg Kayleen Memos W/water Sol Cm  07/02/2011  *RADIOLOGY REPORT*  Clinical Data:  Status post antrectomy, gastrojejunostomy and Roux- en-Y.  Evaluate gastrojejunostomy anastomosis.  UPPER GI SERIES WITHOUT KUB  Technique:  Routine upper GI series was performed with water- soluble contrast.  Fluoroscopy Time: 2.8 minutes  Comparison:  Plain films 06/20/2011  Findings: 150 ml of Omnipaque-300 was injected through the patient's indwelling NG tube.  The tip of the catheter is within the small bowel just beyond the gastrojejunostomy anastomosis.  No contrast extravasation noted at the gastrojejunostomy.  Very slow passage of contrast into the small bowel.  The patient was left in the semiupright position for approximately 10-15 minutes with only a small amount of contrast passing into the proximal jejunum.  IMPRESSION: Gastrojejunostomy is patent without evidence of extravasation. Very slow emptying of contrast from the stomach.  Original Report Authenticated By: Cyndie Chime, M.D.    Scheduled Meds:   . adalimumab  40 mg Subcutaneous Q7 days  . amLODipine  5 mg Oral Daily  . aspirin  325 mg Oral Daily  . celecoxib  200 mg Oral BID  . chlorhexidine  15 mL Mouth/Throat BID  . enoxaparin  40 mg Subcutaneous Q24H  . feeding supplement  1 Container Oral BID BM  . fluconazole (DIFLUCAN) IV  200  mg Intravenous Q24H  . gabapentin  100 mg Oral BID  . insulin aspart  0-9 Units Subcutaneous Q4H  . metoprolol tartrate  25 mg Oral BID  . ondansetron (ZOFRAN) IV  4 mg Intravenous TID AC  . pantoprazole  40 mg Oral BID AC  . piperacillin-tazobactam (ZOSYN)  IV  3.375 g Intravenous Q8H  . potassium chloride  40 mEq Oral Once  . QUEtiapine  100 mg Oral BID  . sodium chloride  10-40 mL Intracatheter Q12H   Continuous Infusions:  PRN Meds:diazepam, HYDROmorphone (DILAUDID) injection, oxyCODONE-acetaminophen, phenol, sodium chloride, sodium chloride, DISCONTD: oxyCODONE-acetaminophen  Assessment/Plan: Overall, slow improvement.  Switch to PO pain regiment.  D/c PCA.  Resume home medications.  Start ASA.  Ambulate.   LOS: 24 days   Truth Barot C

## 2011-07-14 NOTE — Progress Notes (Signed)
16 Days Post-Op  Subjective: Patient still with pain but much better controlled.  NO change with PO.    Objective: Vital signs in last 24 hours: Temp:  [95.8 F (35.4 C)-98.3 F (36.8 C)] 98.3 F (36.8 C) (04/24 1819) Pulse Rate:  [67-75] 72  (04/24 1819) Resp:  [18-20] 20  (04/24 1819) BP: (98-108)/(67-75) 108/75 mmHg (04/24 1819) SpO2:  [94 %-97 %] 94 % (04/24 1819) Last BM Date: 07/11/11  Intake/Output from previous day: 04/23 0701 - 04/24 0700 In: 240 [P.O.:240] Out: 2385 [Urine:2250; Drains:135] Intake/Output this shift:    General appearance: alert and no distress Resp: clear to auscultation bilaterally Cardio: regular rate and rhythm GI: +BS, soft, flat.  Moderate ruq tenderness.  Incision is c/d/i.  Jp is bilious.  Lab Results:   Basename 07/14/11 0522 07/13/11 0556  WBC 9.7 12.5*  HGB 8.8* 8.9*  HCT 28.0* 28.3*  PLT 968* 1076*   BMET  Basename 07/14/11 0522 07/13/11 0556  NA 139 134*  K 2.8* 3.7  CL 99 95*  CO2 30 29  GLUCOSE 89 102*  BUN 5* 4*  CREATININE 0.43* 0.38*  CALCIUM 9.4 9.2   PT/INR No results found for this basename: LABPROT:2,INR:2 in the last 72 hours ABG No results found for this basename: PHART:2,PCO2:2,PO2:2,HCO3:2 in the last 72 hours  Studies/Results: No results found.  Anti-infectives: Anti-infectives     Start     Dose/Rate Route Frequency Ordered Stop   07/06/11 1000   piperacillin-tazobactam (ZOSYN) IVPB 3.375 g        3.375 g 12.5 mL/hr over 240 Minutes Intravenous Every 8 hours 07/06/11 0759     06/30/11 0900   fluconazole (DIFLUCAN) IVPB 200 mg        200 mg 100 mL/hr over 60 Minutes Intravenous Every 24 hours 06/30/11 0807     06/29/11 1300   ertapenem (INVANZ) 1 g in sodium chloride 0.9 % 50 mL IVPB        1 g 100 mL/hr over 30 Minutes Intravenous Every 24 hours 06/28/11 2007 06/29/11 1416   06/28/11 1330   ertapenem (INVANZ) 1 g in sodium chloride 0.9 % 50 mL IVPB  Status:  Discontinued        1 g 100  mL/hr over 30 Minutes Intravenous Every 24 hours 06/28/11 1315 06/28/11 1840   06/28/11 1309   sodium chloride 0.9 % with ertapenem [facility]) ADS Med  Status:  Discontinued     Comments: BELL, ANITRA: cabinet override         06/28/11 1309 06/28/11 1310          Assessment/Plan: s/p Procedure(s) (LRB): ANTRECTOMY (N/A) GASTROJEJUNOSTOMY (N/A) Will increase PO coverage.  Urology consult for voiding issues.  Hopefule d/c in the next 24-48hrs.  LOS: 24 days    Ival Pacer C 07/14/2011

## 2011-07-14 NOTE — Progress Notes (Signed)
Physical Therapy Treatment Patient Details Name: Jordan Haney MRN: [id number] DOB: [birth year] Today's Date: 07/14/2011 Time: 4098-1191 PT Time Calculation (min): 21 min  PT Assessment / Plan / Recommendation Comments on Treatment Session  Patient able to complete slightly farther gait distance than yesterday- 104' RW;min A;however still feeling faint at halfway point.Patient discouraged by fatigue and weakness today    Follow Up Recommendations       Equipment Recommendations       Frequency     Plan      Precautions / Restrictions Restrictions Weight Bearing Restrictions: No   Pertinent Vitals/Pain     Mobility  Bed Mobility Right Sidelying to Sit: 6: Modified independent (Device/Increase time) Sitting - Scoot to Edge of Bed: 6: Modified independent (Device/Increase time) Transfers Transfers: Stand to Sit Sit to Stand: With upper extremity assist;From elevated surface Stand to Sit: 6: Modified independent (Device/Increase time) Details for Transfer Assistance: verbal cues for hand placement Ambulation/Gait Ambulation/Gait Assistance: 4: Min guard Ambulation Distance (Feet): 104 Feet Assistive device: Rolling walker Gait Pattern: Trunk flexed Gait velocity: slow-delicate    Exercises     PT Goals Acute Rehab PT Goals PT Goal: Ambulate - Progress: Progressing toward goal  Visit Information  Last PT Received On: 07/14/11    Subjective Data      Cognition       Balance     End of Session PT - End of Session Activity Tolerance: Patient tolerated treatment well;Patient limited by fatigue Patient left: in chair;with call bell/phone within reach;with chair alarm set Nurse Communication: Mobility status    Noreta Kue ATKINSO 07/14/2011, 11:59 AM

## 2011-07-15 LAB — COMPREHENSIVE METABOLIC PANEL
ALT: 82 U/L — ABNORMAL HIGH (ref 0–35)
AST: 89 U/L — ABNORMAL HIGH (ref 0–37)
Alkaline Phosphatase: 264 U/L — ABNORMAL HIGH (ref 39–117)
CO2: 29 mEq/L (ref 19–32)
GFR calc Af Amer: >90 mL/min (ref >90)
Glucose, Bld: 87 mg/dL (ref 70–99)
Potassium: 3.4 mEq/L — ABNORMAL LOW (ref 3.5–5.1)
Sodium: 138 mEq/L (ref 135–145)
Total Protein: 6.6 g/dL (ref 6.0–8.3)

## 2011-07-15 LAB — GLUCOSE, CAPILLARY
Glucose-Capillary: 88 mg/dL (ref 70–99)
Glucose-Capillary: 93 mg/dL (ref 70–99)

## 2011-07-15 MED ORDER — AMOXICILLIN-POT CLAVULANATE 875-125 MG PO TABS
1.0000 | ORAL_TABLET | Freq: Two times a day (BID) | ORAL | Status: DC
  Administered 2011-07-15 – 2011-07-16 (×2): 1 via ORAL
  Filled 2011-07-15 (×2): qty 1

## 2011-07-15 MED ORDER — ONDANSETRON HCL 4 MG/2ML IJ SOLN
4.0000 mg | Freq: Four times a day (QID) | INTRAMUSCULAR | Status: DC | PRN
  Administered 2011-07-15: 4 mg via INTRAVENOUS
  Filled 2011-07-15: qty 2

## 2011-07-15 MED ORDER — SODIUM CHLORIDE 0.9 % IJ SOLN
INTRAMUSCULAR | Status: AC
  Filled 2011-07-15: qty 3

## 2011-07-15 NOTE — Consult Note (Signed)
[phone]

## 2011-07-15 NOTE — Progress Notes (Signed)
17 Days Post-Op  Subjective: Patient states she is feeling slightly improved. Still with right-sided abdominal pain. Had associated nausea this morning after eating eggs but this is also improved. No fevers or chills.  Objective: Vital signs in last 24 hours: Temp:  [97.6 F (36.4 C)-98.3 F (36.8 C)] 97.6 F (36.4 C) (04/25 1038) Pulse Rate:  [68-77] 68  (04/25 1038) Resp:  [18-20] 18  (04/25 1038) BP: (108-126)/(73-81) 126/81 mmHg (04/25 1038) SpO2:  [92 %-100 %] 96 % (04/25 1038) Last BM Date: 07/14/11  Intake/Output from previous day: 04/24 0701 - 04/25 0700 In: 420 [P.O.:420] Out: 1210 [Urine:1100; Drains:110] Intake/Output this shift: Total I/O In: 120 [P.O.:120] Out: -   General appearance: alert and no distress Resp: clear to auscultation bilaterally Cardio: regular rate and rhythm GI: Positive bowel sounds, soft, flat, moderate right-sided abdominal tenderness. Incision is clean dry and intact. JP demonstrates minimal bilious drainage.  Lab Results:   Basename 07/15/11 0541 07/14/11 0522  WBC 9.1 9.7  HGB 8.7* 8.8*  HCT 28.5* 28.0*  PLT 1020* 968*   BMET  Basename 07/15/11 0541 07/14/11 0522  NA 138 139  K 3.4* 2.8*  CL 100 99  CO2 29 30  GLUCOSE 87 89  BUN 6 5*  CREATININE 0.46* 0.43*  CALCIUM 9.3 9.4   PT/INR No results found for this basename: LABPROT:2,INR:2 in the last 72 hours ABG No results found for this basename: PHART:2,PCO2:2,PO2:2,HCO3:2 in the last 72 hours  Studies/Results: No results found.  Anti-infectives: Anti-infectives     Start     Dose/Rate Route Frequency Ordered Stop   07/15/11 1245   amoxicillin-clavulanate (AUGMENTIN) 875-125 MG per tablet 1 tablet        1 tablet Oral Every 12 hours 07/15/11 1243     07/06/11 1000   piperacillin-tazobactam (ZOSYN) IVPB 3.375 g  Status:  Discontinued        3.375 g 12.5 mL/hr over 240 Minutes Intravenous Every 8 hours 07/06/11 0759 07/15/11 1243   06/30/11 0900   fluconazole  (DIFLUCAN) IVPB 200 mg  Status:  Discontinued        200 mg 100 mL/hr over 60 Minutes Intravenous Every 24 hours 06/30/11 0807 07/15/11 1301   06/29/11 1300   ertapenem (INVANZ) 1 g in sodium chloride 0.9 % 50 mL IVPB        1 g 100 mL/hr over 30 Minutes Intravenous Every 24 hours 06/28/11 2007 06/29/11 1416   06/28/11 1330   ertapenem (INVANZ) 1 g in sodium chloride 0.9 % 50 mL IVPB  Status:  Discontinued        1 g 100 mL/hr over 30 Minutes Intravenous Every 24 hours 06/28/11 1315 06/28/11 1840   06/28/11 1309   sodium chloride 0.9 % with ertapenem [facility]) ADS Med  Status:  Discontinued     Comments: BELL, ANITRA: cabinet override         06/28/11 1309 06/28/11 1310          Assessment/Plan: s/p Procedure(s) (LRB): ANTRECTOMY (N/A) GASTROJEJUNOSTOMY (N/A) Continued slow improvement. Will exchange IV antibodies for oral today. Continue oral pain medication. At this point the patient continues to tolerate all oral medications and oral intake we'll plan to discharge likely tomorrow. Patient also requesting another trial of discontinuation of the Foley, I feel this is reasonable.  Hopeful discharge tomorrow.  LOS: 25 days    Savaya Hakes C 07/15/2011

## 2011-07-15 NOTE — Progress Notes (Signed)
Performed post residual void on patient.  Bladder scanner showed less than 100cc of urine in bladder after void.

## 2011-07-16 LAB — CBC
HCT: 28.5 % — ABNORMAL LOW (ref 36.0–46.0)
HCT: 27.9 % — ABNORMAL LOW (ref 36.0–46.0)
Hemoglobin: 8.7 g/dL — ABNORMAL LOW (ref 12.0–15.0)
Hemoglobin: 8.7 g/dL — ABNORMAL LOW (ref 12.0–15.0)
MCH: 26.9 pg (ref 26.0–34.0)
MCHC: 31.2 g/dL (ref 30.0–36.0)
MCV: 88 fL (ref 78.0–100.0)
MCV: 87.5 fL (ref 78.0–100.0)
Platelets: >900 10*3/uL (ref 150–400)
RBC: 3.24 MIL/uL — ABNORMAL LOW (ref 3.87–5.11)
RDW: 17.1 % — ABNORMAL HIGH (ref 11.5–15.5)

## 2011-07-16 LAB — COMPREHENSIVE METABOLIC PANEL
ALT: 66 U/L — ABNORMAL HIGH (ref 0–35)
AST: 63 U/L — ABNORMAL HIGH (ref 0–37)
Albumin: 2.1 g/dL — ABNORMAL LOW (ref 3.5–5.2)
CO2: 29 mEq/L (ref 19–32)
Chloride: 101 mEq/L (ref 96–112)
GFR calc non Af Amer: >90 mL/min (ref >90)
Potassium: 3.9 mEq/L (ref 3.5–5.1)
Sodium: 137 mEq/L (ref 135–145)
Total Bilirubin: 0.8 mg/dL (ref 0.3–1.2)

## 2011-07-16 MED ORDER — SODIUM CHLORIDE 0.9 % IJ SOLN
INTRAMUSCULAR | Status: AC
  Filled 2011-07-16: qty 3

## 2011-07-16 MED ORDER — ASPIRIN 325 MG PO TABS: 325.0000 mg | ORAL_TABLET | Freq: Every day | ORAL | Status: DC

## 2011-07-16 MED ORDER — DIAZEPAM 5 MG PO TABS: 5.0000 mg | ORAL_TABLET | Freq: Three times a day (TID) | ORAL | Status: DC | PRN

## 2011-07-16 MED ORDER — GABAPENTIN 100 MG PO CAPS: 100.0000 mg | ORAL_CAPSULE | Freq: Two times a day (BID) | ORAL | Status: DC

## 2011-07-16 MED ORDER — AMOXICILLIN-POT CLAVULANATE 875-125 MG PO TABS: 1.0000 | ORAL_TABLET | Freq: Two times a day (BID) | ORAL | Status: AC

## 2011-07-16 MED ORDER — OXYCODONE-ACETAMINOPHEN 7.5-325 MG PO TABS: 1.0000 | ORAL_TABLET | ORAL | Status: DC | PRN

## 2011-07-16 NOTE — Progress Notes (Signed)
Patient discharged home with home health in place.  Patient instructed on new medications and how to take them.  Instructed on incisional and bulb care for JP drain.  Follow up appt in place with Dr. Leticia Penna for one week.  PICC line removed.  Instructed to contact MD for any severe uncontrolled pain, blurred vision, pesistent N/V, or signs on infection.  Instructed to not drive and to not lift anything > 20 pounds.  Patient verbalizes understanding of instructions.  Has no questions at this time

## 2011-07-16 NOTE — Discharge Instructions (Signed)
Bulb Drain Home Care A bulb drain is a small, plastic reservoir which creates a gentle suction. It is used to remove excess fluid from a surgical wound. The color and amount of fluid will vary. Immediately after surgery, the fluid is bright red. It may gradually change to a yellow color. When the amount decreases to about 1 or 2 tablespoons (15 to 30 cc) per 24 hours, your caregiver will usually remove it. DAILY CARE  Keep the bulb compressed at all times, except while emptying it. The compression creates suction.   Keep sites where the tubes enter the skin dry and covered with a light bandage (dressing).   Tape the tubes to your skin, 1 to 2 inches below the insertion sites, to keep from pulling on your stitches. Tubes are stitched in place and will not slip out.   Pin the bulb to your shirt (not to your pants) with a safety pin.   For the first few days after surgery, there usually is more fluid in the bulb. Empty the bulb whenever it becomes half full because the bulb does not create enough suction if it is too full. Include this amount in your 24 hour totals.   When the amount of drainage decreases, empty the bulb at the same time every day. Write down the amounts and the 24 hour totals. Your caregiver will want to know them. This helps your caregiver know when the tubes can be removed.   Once you are home, it is no longer necessary to strip the tubes as may have been done in the hospital. If there is drainage around the tube sites, change dressings and keep the area dry. If you see a clot in the tube, leave it alone. However, if the tube does not appear to be draining, let your caregiver know.  TO EMPTY THE BULB  Open the stopper to release suction.   Holding the stopper out of the way, pour drainage into the measuring cup that was sent home with you.   Measure and write down the amount. If there are 2 bulbs, note the amount of drainage from bulb 1 or bulb 2 and keep the totals separate.  Your caregiver will want to know which tube is draining more.   Compress the bulb by folding it in half.   Replace the stopper.   Check the tape that holds the tube to your skin, and pin the bulb to your shirt.  SEEK MEDICAL CARE IF:  The drainage develops a bad odor.   You have an oral temperature above 102 F (38.9 C).   The amount of drainage from your wound suddenly increases or decreases.   You accidentally pull out your drain.   You have any other questions or concerns.  MAKE SURE YOU:   Understand these instructions.   Will watch your condition.   Will get help right away if you are not doing well or get worse.  Document Released: 03/05/2000 Document Revised: 02/25/2011 Document Reviewed: 03/08/2005 [employer] Patient Information 2012 [employer]. 

## 2011-07-16 NOTE — Discharge Summary (Signed)
NAME:  GIOVANNINA, MUN NO.:  [id number]  MEDICAL RECORD NO.:  [id number]  LOCATION:                                 FACILITY:  PHYSICIAN:  Ky Barban, M.D.DATE OF BIRTH:  1955-06-11  DATE OF ADMISSION:  06/20/2011 DATE OF DISCHARGE:  [facility]                              DISCHARGE SUMMARY   CHIEF COMPLAINT:  Urinary retention.  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  She has gone into urinary retention.  She was unable to void, had a Foley catheter, but this afternoon, they took the Foley catheter out and she was able to void first time since surgery and she thinks she emptied her bladder completely.  No history of any other urological problem.  It could be related with her anesthesia.  IMPRESSION:  Urinary retention.  I have told the nurse to check her residual ones and let me know, if she goes into retention, only then I will come back and take care of her, otherwise, I think she is voiding and there is no residual urine.  It depends how much residual she has. If there is minimal residual, then she would not need anything else.     Ky Barban, M.D.     MIJ/MEDQ  D:  07/15/2011  T:  07/16/2011  Job:  [id number]

## 2011-07-16 NOTE — Progress Notes (Signed)
PICC line removed, 43 cm.  No bleeding noted.  Verbalized understanding to leave bandage on for 24 hours. No heavy lifting/careful with her arm for 24 hours.  If bleeding occurs, apply pressure; if it continues return to hospital.

## 2011-07-16 NOTE — Discharge Summary (Signed)
Physician Discharge Summary  Patient ID: Jordan Haney MRN: [id number] DOB/AGE: 1955-04-26 56 y.o.  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 Since preoperative workup was undertaken and risks benefits alternatives of surgical intervention were discussed at length patient. He was taken to the operating room for planned antrectomy Roux-en-Y gastrojejunostomy. At the time of her operation due to the amount of process around the first portion of the duodenum, a defect was created in the wall the common bile duct and repair was undertaken. The patient underwent a antrectomy with Roux-en-Y gastrojejunostomy and choledocho duodenostomy.  She was initially watched in the intensive care unit. She had a slow but continuous recovery. During this period she developed increased bilious drainage from her JP drain site controlled drainage of her leak. Postoperative leukocytosis has improved with broad-spectrum antibiotic coverage as well as antifungals. She has been increase in activity as well as on diet. She is now tolerating a low-fat diet off of TPN. She has normal bowel function. She is ambulatory. Pain is controlled with oral analgesia.  Consults: GI  Significant Diagnostic Studies: radiology: CT scan: Abdomen and pelvis, EGD  Treatments: surgery:  Antrectomy with Roux-en-Y  gastrojejunostomy choledocho duodenostomy  Discharge Exam: Blood pressure 122/76, pulse 71, temperature 98.3 F (36.8 C), temperature source Oral, resp. rate 18, height 5\' 4"  (1.626 m), weight 70.2 kg (154 lb 12.2 oz), SpO2 95.00%. General appearance: alert and no distress Eyes: Pupils equal round reactive, extraocular movements are intact, no scleral icterus is noted. Resp: clear to auscultation bilaterally Cardio: regular rate and rhythm GI:  positive bowel sounds, soft, flat, moderate right-sided abdominal tenderness. JP drainage is minimal with ileus output. Midline incision is clean dry and intact. Staples are present.  Disposition: 01-Home or Self Care  Discharge Orders    Future Orders Please Complete By Expires   Diet - low sodium heart healthy      Increase activity slowly      Discharge instructions      Comments:   Increase activity as tolerated. May place ice pack for comfort.  Alternate an anti-inflammatory such as ibuprofen (Motrin, Advil) 400-600mg  every 6 hours with the prescribed pain medication.   Do not take any additional acetaminophen as there is Tylenol in the pain medication.    Driving Restrictions      Comments:   No driving while on pain medications.    Lifting restrictions      Comments:   No lifting over 20lbs for 4-5 weeks post-op.    Discharge wound care:      Comments:   Clean surgical sites with soap and water.  May shower the morning after surgery unless instructed by Dr. Leticia Penna otherwise.  No soaking for 2-3 weeks.    Continue drain care as demonstrated.   Call MD for:  temperature >100.4      Call MD for:  redness, tenderness, or signs of infection (pain, swelling, redness, odor or green/yellow discharge around incision site)  Call MD for:  difficulty breathing, headache or visual disturbances      Call MD for:  severe uncontrolled pain      Call MD for:  persistant nausea and vomiting        Medication List  As of 07/16/2011 10:41  AM   STOP taking these medications         ondansetron 4 MG tablet      oxyCODONE-acetaminophen 5-325 MG per tablet      polyethylene glycol packet         TAKE these medications         amLODipine 5 MG tablet   Commonly known as: NORVASC   Take 1 tablet (5 mg total) by mouth daily.      amoxicillin-clavulanate 875-125 MG per tablet   Commonly known as: AUGMENTIN   Take 1 tablet by mouth every 12 (twelve) hours.      aspirin 325 MG tablet   Take 1 tablet (325 mg total) by mouth daily.      diazepam 5 MG tablet   Commonly known as: VALIUM   Take 1 tablet (5 mg total) by mouth every 8 (eight) hours as needed.      esomeprazole 40 MG capsule   Commonly known as: NEXIUM   Take 40 mg by mouth daily before breakfast.      gabapentin 100 MG capsule   Commonly known as: NEURONTIN   Take 1 capsule (100 mg total) by mouth 2 (two) times daily.      HUMIRA 40 MG/0.8ML injection   Generic drug: adalimumab   Inject 40 mg into the skin every 7 (seven) days.      Linaclotide 145 MCG Caps   Take 1 capsule by mouth daily. 1 PO DAILY      LORazepam 0.5 MG tablet   Commonly known as: ATIVAN   Take 0.5 mg by mouth 2 (two) times daily.      metoCLOPramide 5 MG tablet   Commonly known as: REGLAN   Take 1 tablet (5 mg total) by mouth 4 (four) times daily.      nitroGLYCERIN 0.4 MG SL tablet   Commonly known as: NITROSTAT   Place 0.4 mg under the tongue every 5 (five) minutes as needed. Chest pain      oxyCODONE-acetaminophen 7.5-325 MG per tablet   Commonly known as: PERCOCET   Take 1-2 tablets by mouth every 4 (four) hours as needed for pain.      QUEtiapine 100 MG tablet   Commonly known as: SEROQUEL   Take 100 mg by mouth 2 (two) times daily.           Follow-up Information    Follow up with Anson [place], DO in 4 weeks. (As needed)       Follow up with Delta Pichon C, MD in 1 week.   Contact information:   [address] [place]  [address] [phone]          Signed: Fabio Bering 07/16/2011, 10:41 AM

## 2011-07-21 ENCOUNTER — Emergency Department (HOSPITAL_COMMUNITY)
Admission: EM | Admit: 2011-07-21 | Discharge: 2011-07-21 | Disposition: A | Discharge status: Home / Self Care | Attending: Emergency Medicine | Admitting: Emergency Medicine

## 2011-07-21 ENCOUNTER — Emergency Department (HOSPITAL_COMMUNITY)

## 2011-07-21 ENCOUNTER — Encounter (HOSPITAL_COMMUNITY): Payer: Self-pay | Admitting: *Deleted

## 2011-07-21 DIAGNOSIS — R110 Nausea: Secondary | ICD-10-CM | POA: Insufficient documentation

## 2011-07-21 DIAGNOSIS — Z7982 Long term (current) use of aspirin: Secondary | ICD-10-CM | POA: Insufficient documentation

## 2011-07-21 DIAGNOSIS — R1084 Generalized abdominal pain: Secondary | ICD-10-CM | POA: Insufficient documentation

## 2011-07-21 DIAGNOSIS — K5900 Constipation, unspecified: Secondary | ICD-10-CM | POA: Insufficient documentation

## 2011-07-21 DIAGNOSIS — R000 Tachycardia, unspecified: Secondary | ICD-10-CM | POA: Insufficient documentation

## 2011-07-21 DIAGNOSIS — M069 Rheumatoid arthritis, unspecified: Secondary | ICD-10-CM | POA: Insufficient documentation

## 2011-07-21 DIAGNOSIS — I252 Old myocardial infarction: Secondary | ICD-10-CM | POA: Insufficient documentation

## 2011-07-21 DIAGNOSIS — Z79899 Other long term (current) drug therapy: Secondary | ICD-10-CM | POA: Insufficient documentation

## 2011-07-21 LAB — COMPREHENSIVE METABOLIC PANEL
Alkaline Phosphatase: 308 U/L — ABNORMAL HIGH (ref 39–117)
BUN: 6 mg/dL (ref 6–23)
GFR calc Af Amer: >90 mL/min (ref >90)
GFR calc non Af Amer: >90 mL/min (ref >90)
Glucose, Bld: 89 mg/dL (ref 70–99)
Potassium: 3.3 mEq/L — ABNORMAL LOW (ref 3.5–5.1)
Total Bilirubin: 0.7 mg/dL (ref 0.3–1.2)
Total Protein: 7.4 g/dL (ref 6.0–8.3)

## 2011-07-21 LAB — DIFFERENTIAL
Eosinophils Absolute: 0.2 10*3/uL (ref 0.0–0.7)
Lymphs Abs: 2 10*3/uL (ref 0.7–4.0)
Monocytes Relative: 7 % (ref 3–12)
Neutrophils Relative %: 61 % (ref 43–77)

## 2011-07-21 LAB — CBC
Hemoglobin: 10.6 g/dL — ABNORMAL LOW (ref 12.0–15.0)
MCH: 26.4 pg (ref 26.0–34.0)
MCV: 85.3 fL (ref 78.0–100.0)
RBC: 4.01 MIL/uL (ref 3.87–5.11)

## 2011-07-21 LAB — LIPASE, BLOOD: Lipase: 53 U/L (ref 11–59)

## 2011-07-21 MED ORDER — ONDANSETRON HCL 4 MG/2ML IJ SOLN
4.0000 mg | Freq: Once | INTRAMUSCULAR | Status: AC
  Administered 2011-07-21: 4 mg via INTRAVENOUS
  Filled 2011-07-21: qty 2

## 2011-07-21 MED ORDER — SODIUM CHLORIDE 0.9 % IV SOLN
Freq: Once | INTRAVENOUS | Status: AC
  Administered 2011-07-21: 16:00:00 via INTRAVENOUS

## 2011-07-21 MED ORDER — HYDROMORPHONE HCL PF 1 MG/ML IJ SOLN
1.0000 mg | Freq: Once | INTRAMUSCULAR | Status: AC
  Administered 2011-07-21: 1 mg via INTRAVENOUS
  Filled 2011-07-21: qty 1

## 2011-07-21 MED ORDER — IOHEXOL 300 MG/ML  SOLN
100.0000 mL | Freq: Once | INTRAMUSCULAR | Status: AC | PRN
  Administered 2011-07-21: 100 mL via INTRAVENOUS

## 2011-07-21 NOTE — ED Provider Notes (Signed)
History  This chart was scribed for Jordan Lennert, MD by Cherlynn Perches. The patient was seen in room APA03/APA03. Patient's care was started at 1405.  CSN: [id number]  Arrival date & time 07/21/11  1405   First MD Initiated Contact with Patient 07/21/11 1511      Chief Complaint  Patient presents with  . Abdominal Pain    (Consider location/radiation/quality/duration/timing/severity/associated sxs/prior treatment) Patient is a 56 y.o. female presenting with abdominal pain. The history is provided by the patient and a relative. No language interpreter was used.  Abdominal Pain The primary symptoms of the illness include abdominal pain and nausea. The primary symptoms of the illness do not include fatigue, vomiting or diarrhea. The current episode started 2 days ago. The onset of the illness was gradual. The problem has been gradually worsening.  Associated with: recent surgery. Symptoms associated with the illness do not include hematuria, frequency or back pain.    Jordan Haney is a 56 y.o. female who presents to the Emergency Department complaining of 2 days of gradually worsening, constant, moderate to severe abdominal pain with associated nausea and constipation. Pt states that she had surgery on her abdomen about 1 month ago to remove part of her stomach and intestines after a bleeding ulcer with complications (Surgeon - Dr. Leticia Penna). Pt was in the hospital until 5 days ago. Pt reports that the pain was not as bad when she left the hospital and only began getting worse 2 days ago. Pt states that she has been taking percocet for pain, but has not had any relief. Pt denies vomiting, headache, fever, and chills. Pt also has a history of pancreatitis, a cholecystectomy, and an appendectomy. Pt denies smoking and alcohol use.     Past Medical History  Diagnosis Date  . RA (rheumatoid arthritis)  2008  . MI (myocardial infarction) 2008    Not well documented, patient reports  reassuring cardiac catheterization and stress testing in [place]  . Constipation 03/21/2011  . Hypokalemia 03/21/2011  . Fatty liver 03/21/2011  . Pancreatitis     states 3 years ago, very severe, ?biliary pancreatitis   . T12 compression fracture 2011  . Duodenal ulcer     remote per patient. +BC powders, patient report negative EGD six months ago  . Dilated pancreatic duct     ?chronic pancreatitis, EUS pending (06/02/11)    Past Surgical History  Procedure Date  . Cholecystectomy   . Knee surgery   . Appendectomy   . Complete hysterectomy   . Ventral wall hernia   . Esophagogastroduodenoscopy 08/2010    Dr. Samuella Cota, [place]. small hh, mild prepylori gastritis. path unavailable  . Esophagogastroduodenoscopy 04/2010    Dr. Samuella Cota, [place]. small hh, mild distal esophagitis, antral gastritis and duodenitis. Bx mild chronic gastritis and no H.pylori  . Esophagogastroduodenoscopy 08/2009    Dr. Allena Katz, [place]. Moderate gastritis, moderate duodenitis with nodularity in proximal duodenal bulb, bx chronic gastritis, no helicobacter, mild chronic duodenitis  . Esophagogastroduodenoscopy 02/2007    Dr. Samuella Cota, [place]. antral gastritis and duodenal ulcers. Bx mild chronic gastritis. No bx from duodenal ulcer available.  Gaspar Bidding dilation 04/06/2011    Procedure: SAVORY DILATION;  Surgeon: Arlyce Harman, MD;  Location: [facility];  Service: Endoscopy;  Laterality: N/A;  16 mm  . Esophagogastroduodenoscopy 04/06/11    Dr. Darrick Penna: 1-2 cm hiatal hernia, mod gastritis, 60mmX3mm linear ulcer in duodenal bulb, stricture 1st part of duodenum, dilated to 12 mm  . Esophagogastroduodenoscopy 05/12/11  partial gastric oulet obstruction secondary to stricture between bulb/2nd portion of duodenum with marked friability and inflammation but no discrete ulcer, dilated stomach. s/p dilation but high risk for restenosis  . Esophagogastroduodenoscopy 06/22/2011    Procedure: ESOPHAGOGASTRODUODENOSCOPY (EGD);  Surgeon:  West Bali, MD;  Location: [facility];  Service: Endoscopy;  Laterality: N/A;  . Gastrojejunostomy 06/28/2011    Procedure: GASTROJEJUNOSTOMY;  Surgeon: Fabio Bering, MD;  Location: [facility];  Service: General;  Laterality: N/A;  Roux-en-y, Choledocalduodenostomy  . Abdominal hysterectomy     Family History  Problem Relation Age of Onset  . Colon cancer Neg Hx   . Liver cancer Neg Hx   . Breast cancer Neg Hx   . Inflammatory bowel disease Neg Hx   . Heart attack Mother     History  Substance Use Topics  . Smoking status: Never Smoker   . Smokeless tobacco: Not on file  . Alcohol Use: No    OB History    Grav Para Term Preterm Abortions TAB SAB Ect Mult Living                  Review of Systems  Constitutional: Negative for fatigue.  HENT: Negative for congestion, sinus pressure and ear discharge.   Eyes: Negative for discharge.  Respiratory: Negative for cough.   Cardiovascular: Negative for chest pain.  Gastrointestinal: Positive for nausea and abdominal pain. Negative for vomiting and diarrhea.  Genitourinary: Negative for frequency and hematuria.  Musculoskeletal: Negative for back pain.  Skin: Negative for rash.  Neurological: Negative for seizures and headaches.  Hematological: Negative.   Psychiatric/Behavioral: Negative for hallucinations.  All other systems reviewed and are negative.    Allergies  Compazine; Promethazine hcl; Vistaril; and Latex  Home Medications   Current Outpatient Rx  Name Route Sig Dispense Refill  . ADALIMUMAB 40 MG/0.8ML [place] KIT Subcutaneous Inject 40 mg into the skin every 7 (seven) days.     . AMLODIPINE BESYLATE 5 MG PO TABS Oral Take 1 tablet (5 mg total) by mouth daily. 30 tablet 0  . AMOXICILLIN-POT CLAVULANATE 875-125 MG PO TABS Oral Take 1 tablet by mouth every 12 (twelve) hours. 20 tablet 0  . ASPIRIN 325 MG PO TABS Oral Take 1 tablet (325 mg total) by mouth daily. 30 tablet 2  . DIAZEPAM 5 MG PO TABS Oral Take 1  tablet (5 mg total) by mouth every 8 (eight) hours as needed. 30 tablet 0  . ESOMEPRAZOLE MAGNESIUM 40 MG PO CPDR Oral Take 40 mg by mouth daily before breakfast.      . GABAPENTIN 100 MG PO CAPS Oral Take 1 capsule (100 mg total) by mouth 2 (two) times daily. 30 capsule 0  . LINACLOTIDE 145 MCG PO CAPS Oral Take 1 capsule by mouth daily. 1 PO DAILY 31 capsule 11  . LORAZEPAM 0.5 MG PO TABS Oral Take 0.5 mg by mouth 2 (two) times daily.    Marland Kitchen METOCLOPRAMIDE HCL 5 MG PO TABS Oral Take 1 tablet (5 mg total) by mouth 4 (four) times daily. 120 tablet 0  . NITROGLYCERIN 0.4 MG SL SUBL Sublingual Place 0.4 mg under the tongue every 5 (five) minutes as needed. Chest pain    . OXYCODONE-ACETAMINOPHEN 7.5-325 MG PO TABS Oral Take 1-2 tablets by mouth every 4 (four) hours as needed for pain. 45 tablet 0  . QUETIAPINE FUMARATE 100 MG PO TABS Oral Take 100 mg by mouth 2 (two) times daily.  Triage Vitals: BP 125/89  Pulse 119  Temp(Src) 97.8 F (36.6 C) (Oral)  Resp 18  Ht 5\' 4"  (1.626 m)  Wt 140 lb (63.504 kg)  BMI 24.03 kg/m2  SpO2 99%  Physical Exam  Nursing note and vitals reviewed. Constitutional: She is oriented to person, place, and time. She appears well-developed and well-nourished.  HENT:  Head: Normocephalic and atraumatic.  Eyes: Conjunctivae and EOM are normal. Pupils are equal, round, and reactive to light. No scleral icterus.  Neck: Normal range of motion. Neck supple. No thyromegaly present.  Cardiovascular: Regular rhythm.  Exam reveals no gallop and no friction rub.   No murmur heard.      tachycardic   Pulmonary/Chest: Effort normal and breath sounds normal. No stridor. She has no wheezes. She has no rales. She exhibits no tenderness.  Abdominal: Soft. She exhibits no distension. There is tenderness (moderate tenderness throughout entire abdomen, worse in periumbilical region). There is no rebound.       Still has abdominal stables and dry dressing on abdomen. Multiple  scars.  Musculoskeletal: Normal range of motion. She exhibits no edema.  Lymphadenopathy:    She has no cervical adenopathy.  Neurological: She is oriented to person, place, and time. Coordination normal.  Skin: Skin is warm and dry. No rash noted. No erythema.  Psychiatric: She has a normal mood and affect. Her behavior is normal.    ED Course  Procedures (including critical care time)  DIAGNOSTIC STUDIES: Oxygen Saturation is 99% on room air, normal by my interpretation.    COORDINATION OF CARE: 3:25PM - Patient and Family understand and agree with initial ED impression and plan with expectations set for ED visit. 4:33PM - re-evaluated pt. Condition mildly improved. 4:43PM - discussed consult with Dr. Leticia Penna with pt and family.   Results for orders placed during the hospital encounter of 07/21/11  CBC      Component Value Range   WBC 7.0  4.0 - 10.5 (K/uL)   RBC 4.01  3.87 - 5.11 (MIL/uL)   Hemoglobin 10.6 (*) 12.0 - 15.0 (g/dL)   HCT 01.0 (*) 27.2 - 46.0 (%)   MCV 85.3  78.0 - 100.0 (fL)   MCH 26.4  26.0 - 34.0 (pg)   MCHC 31.0  30.0 - 36.0 (g/dL)   RDW 53.6 (*) 64.4 - 15.5 (%)   Platelets >900 (*) 150 - 400 (K/uL)  DIFFERENTIAL      Component Value Range   Neutrophils Relative 61  43 - 77 (%)   Neutro Abs 4.2  1.7 - 7.7 (K/uL)   Lymphocytes Relative 28  12 - 46 (%)   Lymphs Abs 2.0  0.7 - 4.0 (K/uL)   Monocytes Relative 7  3 - 12 (%)   Monocytes Absolute 0.5  0.1 - 1.0 (K/uL)   Eosinophils Relative 2  0 - 5 (%)   Eosinophils Absolute 0.2  0.0 - 0.7 (K/uL)   Basophils Relative 2 (*) 0 - 1 (%)   Basophils Absolute 0.1  0.0 - 0.1 (K/uL)  COMPREHENSIVE METABOLIC PANEL      Component Value Range   Sodium 138  135 - 145 (mEq/L)   Potassium 3.3 (*) 3.5 - 5.1 (mEq/L)   Chloride 100  96 - 112 (mEq/L)   CO2 28  19 - 32 (mEq/L)   Glucose, Bld 89  70 - 99 (mg/dL)   BUN 6  6 - 23 (mg/dL)   Creatinine, Ser 0.34 (*) 0.50 - 1.10 (mg/dL)  Calcium 9.4  8.4 - 10.5 (mg/dL)    Total Protein 7.4  6.0 - 8.3 (g/dL)   Albumin 2.6 (*) 3.5 - 5.2 (g/dL)   AST 21  0 - 37 (U/L)   ALT 21  0 - 35 (U/L)   Alkaline Phosphatase 308 (*) 39 - 117 (U/L)   Total Bilirubin 0.7  0.3 - 1.2 (mg/dL)   GFR calc non Af Amer >90  >90 (mL/min)   GFR calc Af Amer >90  >90 (mL/min)  LIPASE, BLOOD      Component Value Range   Lipase 53  11 - 59 (U/L)   Ct Abdomen Pelvis W Contrast  07/21/2011  *RADIOLOGY REPORT*  Clinical Data: Abdominal pain with nausea and vomiting for 2 days.  CT ABDOMEN AND PELVIS WITH CONTRAST  Technique:  Multidetector CT imaging of the abdomen and pelvis was performed following the standard protocol during bolus administration of intravenous contrast.  Contrast: OMNIPAQUE IOHEXOL 300 MG/ML  SOLN  Comparison: 07/06/2011  Findings: There is a moderate right pleural effusion with overlying atelectasis and consolidation.  This is similar to the previous exam.  Mild plate-like atelectasis noted in the left base.  Diffuse fatty infiltration of the liver parenchyma identified. Prior cholecystectomy.  The common bile duct is increased in caliber measuring up to the 1.3 cm, image 23.  This is similar to prior exam.  Mild prominence of the pancreatic duct is noted.  The spleen appears normal.  Both adrenal glands are within normal limits.  Normal appearance of the right kidney.  There is a cyst arising from the upper pole the left kidney.  No upper abdominal adenopathy.  There is no pelvic or inguinal adenopathy.  Urinary bladder appears normal for degree of distention.  There are postoperative changes from gastric antrectomy and gastrojejunostomy.  Abnormal dilatation of the proximal jejunal loops identified which measure up to 3.6 cm.  This appears improved when compared with 07/05/2011.  The mid and distal small bowel loops are normal in caliber.  Larger bore drainage catheter is identified within the right upper quadrant of the abdomen.  The tip of the catheter is adjacent to the  pancreatic head.  There is a fluid collection along the inferior margin of the liver parenchyma.  This has a well-defined enhancing rind but does not appear to communicate with the drainage catheter.  This measures approximately 2.0 x 5.2 x 5.5 cm.  Within the right lower quadrant of the abdomen there is a second focal fluid collection which has a well-defined, and enhancing rind.  This measures 3.2 x 4.6 x 4.8 cm.  No additional fluid collections are identified.  No significant free fluid identified within the abdomen.  IMPRESSION:  1.  Interval maturation of two abscesses within the right abdomen. One extends along the inferior margin of the right hepatic lobe. The second fluid collection is in the right lower quadrant within the iliac fossa. 2.  Postoperative changes compatible with gastric antrectomy and gastrojejunostomy. 3.  Proximal jejunal ileus which appears improved from 07/06/2011 4.  Persistent right pleural effusion with right lower lobe atelectasis/consolidation.  Original Report Authenticated By: Rosealee Albee, M.D.   No diagnosis found.  I spoke with her surgeon  And he looked at the ct. He will see the pt tomorrow in the office  MDM        The chart was scribed for me under my direct supervision.  I personally performed the history, physical, and medical decision making  and all procedures in the evaluation of this patient.Jordan Lennert, MD 07/21/11 2010

## 2011-07-21 NOTE — ED Notes (Signed)
Pt moaning, c/o 10/10 abd pain.  edp notified and orders received.

## 2011-07-21 NOTE — Discharge Instructions (Signed)
Follow up with your md tomorrow as scheduled

## 2011-07-21 NOTE — ED Notes (Signed)
ABd pain, surgery 1 month ago, still has abd staples, dry dressing on abd.  Nausea , no vomiting.  No fever.  NO bm since Friday

## 2011-07-24 ENCOUNTER — Encounter (HOSPITAL_COMMUNITY): Payer: Self-pay

## 2011-07-24 ENCOUNTER — Inpatient Hospital Stay (HOSPITAL_COMMUNITY)
Admission: EM | Admit: 2011-07-24 | Discharge: 2011-07-28 | DRG: 440 | Disposition: A | Discharge status: Home / Self Care | Attending: General Surgery | Admitting: General Surgery

## 2011-07-24 DIAGNOSIS — K861 Other chronic pancreatitis: Secondary | ICD-10-CM | POA: Diagnosis present

## 2011-07-24 DIAGNOSIS — I252 Old myocardial infarction: Secondary | ICD-10-CM

## 2011-07-24 DIAGNOSIS — M069 Rheumatoid arthritis, unspecified: Secondary | ICD-10-CM | POA: Diagnosis present

## 2011-07-24 DIAGNOSIS — R1013 Epigastric pain: Secondary | ICD-10-CM | POA: Diagnosis present

## 2011-07-24 DIAGNOSIS — K859 Acute pancreatitis without necrosis or infection, unspecified: Principal | ICD-10-CM | POA: Diagnosis present

## 2011-07-24 DIAGNOSIS — Z9884 Bariatric surgery status: Secondary | ICD-10-CM

## 2011-07-24 LAB — DIFFERENTIAL
Basophils Absolute: 0.1 10*3/uL (ref 0.0–0.1)
Basophils Relative: 1 % (ref 0–1)
Neutro Abs: 3.7 10*3/uL (ref 1.7–7.7)
Neutrophils Relative %: 61 % (ref 43–77)

## 2011-07-24 LAB — CBC
Hemoglobin: 10.4 g/dL — ABNORMAL LOW (ref 12.0–15.0)
MCHC: 31.4 g/dL (ref 30.0–36.0)
Platelets: 691 10*3/uL — ABNORMAL HIGH (ref 150–400)
RDW: 16.8 % — ABNORMAL HIGH (ref 11.5–15.5)

## 2011-07-24 LAB — COMPREHENSIVE METABOLIC PANEL
AST: 26 U/L (ref 0–37)
Albumin: 2.6 g/dL — ABNORMAL LOW (ref 3.5–5.2)
Alkaline Phosphatase: 306 U/L — ABNORMAL HIGH (ref 39–117)
Chloride: 97 mEq/L (ref 96–112)
Potassium: 2.9 mEq/L — ABNORMAL LOW (ref 3.5–5.1)
Sodium: 138 mEq/L (ref 135–145)
Total Bilirubin: 0.7 mg/dL (ref 0.3–1.2)
Total Protein: 6.9 g/dL (ref 6.0–8.3)

## 2011-07-24 MED ORDER — PANCRELIPASE (LIP-PROT-AMYL) 12000-38000 UNITS PO CPEP
1.0000 | ORAL_CAPSULE | Freq: Three times a day (TID) | ORAL | Status: DC
  Administered 2011-07-24 – 2011-07-25 (×3): 1 via ORAL
  Filled 2011-07-24 (×3): qty 1

## 2011-07-24 MED ORDER — SODIUM CHLORIDE 0.9 % IV BOLUS (SEPSIS)
1000.0000 mL | Freq: Once | INTRAVENOUS | Status: AC
  Administered 2011-07-24: 1000 mL via INTRAVENOUS

## 2011-07-24 MED ORDER — ONDANSETRON HCL 4 MG/2ML IJ SOLN
4.0000 mg | Freq: Once | INTRAMUSCULAR | Status: AC
  Administered 2011-07-24: 4 mg via INTRAVENOUS
  Filled 2011-07-24: qty 2

## 2011-07-24 MED ORDER — CELECOXIB 100 MG PO CAPS
200.0000 mg | ORAL_CAPSULE | Freq: Two times a day (BID) | ORAL | Status: DC
  Administered 2011-07-24 – 2011-07-28 (×8): 200 mg via ORAL
  Filled 2011-07-24: qty 1
  Filled 2011-07-24 (×2): qty 2
  Filled 2011-07-24 (×4): qty 1
  Filled 2011-07-24 (×3): qty 2

## 2011-07-24 MED ORDER — HYDROMORPHONE HCL PF 1 MG/ML IJ SOLN
1.0000 mg | INTRAMUSCULAR | Status: DC | PRN
  Administered 2011-07-24 – 2011-07-25 (×3): 2 mg via INTRAVENOUS
  Administered 2011-07-25: 1 mg via INTRAVENOUS
  Administered 2011-07-25: 2 mg via INTRAVENOUS
  Filled 2011-07-24 (×3): qty 2
  Filled 2011-07-24 (×2): qty 1
  Filled 2011-07-24: qty 2

## 2011-07-24 MED ORDER — GABAPENTIN 100 MG PO CAPS: 100.0000 mg | ORAL_CAPSULE | Freq: Two times a day (BID) | ORAL | Status: DC

## 2011-07-24 MED ORDER — PANTOPRAZOLE SODIUM 40 MG PO TBEC
40.0000 mg | DELAYED_RELEASE_TABLET | Freq: Every day | ORAL | Status: DC
  Administered 2011-07-24 – 2011-07-28 (×5): 40 mg via ORAL
  Filled 2011-07-24 (×5): qty 1

## 2011-07-24 MED ORDER — HYDROMORPHONE HCL PF 1 MG/ML IJ SOLN
INTRAMUSCULAR | Status: AC
  Administered 2011-07-24: 2 mg
  Filled 2011-07-24: qty 2

## 2011-07-24 MED ORDER — HYDROMORPHONE HCL PF 1 MG/ML IJ SOLN
1.0000 mg | Freq: Once | INTRAMUSCULAR | Status: AC
  Administered 2011-07-24: 1 mg via INTRAVENOUS
  Filled 2011-07-24: qty 1

## 2011-07-24 MED ORDER — ONDANSETRON HCL 4 MG/2ML IJ SOLN
4.0000 mg | Freq: Four times a day (QID) | INTRAMUSCULAR | Status: DC | PRN
  Administered 2011-07-26: 4 mg via INTRAVENOUS
  Filled 2011-07-24: qty 2

## 2011-07-24 MED ORDER — POTASSIUM CHLORIDE 10 MEQ/100ML IV SOLN
10.0000 meq | Freq: Once | INTRAVENOUS | Status: AC
  Administered 2011-07-24: 10 meq via INTRAVENOUS
  Filled 2011-07-24: qty 100

## 2011-07-24 MED ORDER — SODIUM CHLORIDE 0.9 % IJ SOLN
INTRAMUSCULAR | Status: AC
  Administered 2011-07-24: 22:00:00
  Filled 2011-07-24: qty 3

## 2011-07-24 MED ORDER — CELECOXIB 100 MG PO CAPS
ORAL_CAPSULE | ORAL | Status: AC
  Filled 2011-07-24: qty 2

## 2011-07-24 MED ORDER — DIAZEPAM 5 MG PO TABS
5.0000 mg | ORAL_TABLET | Freq: Three times a day (TID) | ORAL | Status: DC | PRN
  Administered 2011-07-25 – 2011-07-26 (×2): 5 mg via ORAL
  Filled 2011-07-24 (×2): qty 1

## 2011-07-24 MED ORDER — LACTATED RINGERS IV SOLN
INTRAVENOUS | Status: DC
  Administered 2011-07-24: 1000 mL via INTRAVENOUS
  Administered 2011-07-25 – 2011-07-28 (×8): via INTRAVENOUS

## 2011-07-24 MED ORDER — ENOXAPARIN SODIUM 40 MG/0.4ML ~~LOC~~ SOLN
40.0000 mg | SUBCUTANEOUS | Status: DC
  Administered 2011-07-24 – 2011-07-26 (×3): 40 mg via SUBCUTANEOUS
  Administered 2011-07-27: 30 mg via SUBCUTANEOUS
  Filled 2011-07-24 (×4): qty 0.4

## 2011-07-24 MED ORDER — HYDROMORPHONE HCL PF 1 MG/ML IJ SOLN
2.0000 mg | Freq: Once | INTRAMUSCULAR | Status: AC
  Administered 2011-07-24: 2 mg via INTRAVENOUS
  Filled 2011-07-24: qty 2

## 2011-07-24 NOTE — ED Provider Notes (Cosign Needed)
History     CSN: [id number]  Arrival date & time 07/24/11  1236   First MD Initiated Contact with Patient 07/24/11 1303      Chief Complaint  Patient presents with  . Abdominal Pain    (Consider location/radiation/quality/duration/timing/severity/associated sxs/prior treatment) Patient is a 56 y.o. female presenting with abdominal pain. The history is provided by the patient (the pt has continued abd pain.). No language interpreter was used.  Abdominal Pain The primary symptoms of the illness include abdominal pain. The primary symptoms of the illness do not include fatigue or diarrhea. The current episode started more than 2 days ago. The onset of the illness was gradual. The problem has not changed since onset. The illness is associated with eating. The patient states that she believes she is currently not pregnant. The patient has had a change in bowel habit. Risk factors for an acute abdominal problem include being elderly. Additional symptoms associated with the illness include chills and anorexia. Symptoms associated with the illness do not include hematuria, frequency or back pain. Significant associated medical issues do not include PUD.    Past Medical History  Diagnosis Date  . RA (rheumatoid arthritis)  2008  . MI (myocardial infarction) 2008    Not well documented, patient reports reassuring cardiac catheterization and stress testing in [place]  . Constipation 03/21/2011  . Hypokalemia 03/21/2011  . Fatty liver 03/21/2011  . Pancreatitis     states 3 years ago, very severe, ?biliary pancreatitis   . T12 compression fracture 2011  . Duodenal ulcer     remote per patient. +BC powders, patient report negative EGD six months ago  . Dilated pancreatic duct     ?chronic pancreatitis, EUS pending (06/02/11)    Past Surgical History  Procedure Date  . Cholecystectomy   . Knee surgery   . Appendectomy   . Complete hysterectomy   . Ventral wall hernia   .  Esophagogastroduodenoscopy 08/2010    Dr. Samuella Cota, Versed. small hh, mild prepylori gastritis. path unavailable  . Esophagogastroduodenoscopy 04/2010    Dr. Samuella Cota, Versed. small hh, mild distal esophagitis, antral gastritis and duodenitis. Bx mild chronic gastritis and no H.pylori  . Esophagogastroduodenoscopy 08/2009    Dr. Allena Katz, Versed. Moderate gastritis, moderate duodenitis with nodularity in proximal duodenal bulb, bx chronic gastritis, no helicobacter, mild chronic duodenitis  . Esophagogastroduodenoscopy 02/2007    Dr. Samuella Cota, Versed. antral gastritis and duodenal ulcers. Bx mild chronic gastritis. No bx from duodenal ulcer available.  Gaspar Bidding dilation 04/06/2011    Procedure: SAVORY DILATION;  Surgeon: Arlyce Harman, MD;  Location: [facility];  Service: Endoscopy;  Laterality: N/A;  16 mm  . Esophagogastroduodenoscopy 04/06/11    Dr. Darrick Penna: 1-2 cm hiatal hernia, mod gastritis, 53mmX3mm linear ulcer in duodenal bulb, stricture 1st part of duodenum, dilated to 12 mm  . Esophagogastroduodenoscopy 05/12/11    partial gastric oulet obstruction secondary to stricture between bulb/2nd portion of duodenum with marked friability and inflammation but no discrete ulcer, dilated stomach. s/p dilation but high risk for restenosis  . Esophagogastroduodenoscopy 06/22/2011    Procedure: ESOPHAGOGASTRODUODENOSCOPY (EGD);  Surgeon: West Bali, MD;  Location: [facility];  Service: Endoscopy;  Laterality: N/A;  . Gastrojejunostomy 06/28/2011    Procedure: GASTROJEJUNOSTOMY;  Surgeon: Fabio Bering, MD;  Location: [facility];  Service: General;  Laterality: N/A;  Roux-en-y, Choledocalduodenostomy  . Abdominal hysterectomy     Family History  Problem Relation Age of Onset  . Colon cancer Neg Hx   .  Liver cancer Neg Hx   . Breast cancer Neg Hx   . Inflammatory bowel disease Neg Hx   . Heart attack Mother     History  Substance Use Topics  . Smoking status: Never Smoker   . Smokeless tobacco: Not on file    . Alcohol Use: No    OB History    Grav Para Term Preterm Abortions TAB SAB Ect Mult Living                  Review of Systems  Constitutional: Positive for chills. Negative for fatigue.  HENT: Negative for congestion, sinus pressure and ear discharge.   Eyes: Negative for discharge.  Respiratory: Negative for cough.   Cardiovascular: Negative for chest pain.  Gastrointestinal: Positive for abdominal pain and anorexia. Negative for diarrhea.  Genitourinary: Negative for frequency and hematuria.  Musculoskeletal: Negative for back pain.  Skin: Negative for rash.  Neurological: Negative for seizures and headaches.  Hematological: Negative.   Psychiatric/Behavioral: Negative for hallucinations.    Allergies  Compazine; Promethazine hcl; Vistaril; Gabapentin; and Latex  Home Medications   Current Outpatient Rx  Name Route Sig Dispense Refill  . AMLODIPINE BESYLATE 5 MG PO TABS Oral Take 1 tablet (5 mg total) by mouth daily. 30 tablet 0  . AMOXICILLIN-POT CLAVULANATE 875-125 MG PO TABS Oral Take 1 tablet by mouth every 12 (twelve) hours. 20 tablet 0  . ASPIRIN 325 MG PO TABS Oral Take 1 tablet (325 mg total) by mouth daily. 30 tablet 2  . DIAZEPAM 5 MG PO TABS Oral Take 5 mg by mouth every 8 (eight) hours as needed. For anxiety    . DOCUSATE SODIUM 100 MG PO CAPS Oral Take 100 mg by mouth daily.    Marland Kitchen HYDROMORPHONE HCL 2 MG PO TABS Oral Take 2 mg by mouth every 4 (four) hours.    . IBUPROFEN 200 MG PO TABS Oral Take 200 mg by mouth every 8 (eight) hours as needed. For pain    . PANCRELIPASE (LIP-PROT-AMYL) 12000 UNITS PO CPEP Oral Take 1 capsule by mouth 3 (three) times daily before meals.    . ADALIMUMAB 40 MG/0.8ML Royal KIT Subcutaneous Inject 40 mg into the skin every 7 (seven) days.       BP 121/84  Pulse 110  Temp(Src) 98.1 F (36.7 C) (Oral)  Resp 26  Ht 5\' 4"  (1.626 m)  Wt 135 lb (61.236 kg)  BMI 23.17 kg/m2  SpO2 100%  Physical Exam  Constitutional: She is  oriented to person, place, and time. She appears well-developed.  HENT:  Head: Normocephalic and atraumatic.  Eyes: Conjunctivae and EOM are normal. No scleral icterus.  Neck: Neck supple. No thyromegaly present.  Cardiovascular: Normal rate and regular rhythm.  Exam reveals no gallop and no friction rub.   No murmur heard. Pulmonary/Chest: No stridor. She has no wheezes. She has no rales. She exhibits no tenderness.  Abdominal: She exhibits no distension. There is tenderness. There is no rebound.  Musculoskeletal: Normal range of motion. She exhibits no edema.  Lymphadenopathy:    She has no cervical adenopathy.  Neurological: She is oriented to person, place, and time. Coordination normal.  Skin: No rash noted. No erythema.  Psychiatric: She has a normal mood and affect. Her behavior is normal.    ED Course  Procedures (including critical care time)  Labs Reviewed  CBC - Abnormal; Notable for the following:    Hemoglobin 10.4 (*)    HCT 33.1 (*)  RDW 16.8 (*)    Platelets 691 (*)    All other components within normal limits  COMPREHENSIVE METABOLIC PANEL - Abnormal; Notable for the following:    Potassium 2.9 (*)    BUN <3 (*) REPEATED TO VERIFY   Creatinine, Ser 0.40 (*)    Albumin 2.6 (*)    Alkaline Phosphatase 306 (*)    All other components within normal limits  DIFFERENTIAL  LIPASE, BLOOD   No results found.   1. Abdominal pain     Dr. Leticia Penna to admit  MDM          Benny Lennert, MD 07/24/11 [phone]

## 2011-07-24 NOTE — ED Notes (Signed)
Pt and family reports that she "had major surgery and stayed in the hospital 38 days", cont. To have severe ab pain since surgery, pain worse today, took dilaudid for pain around 9am w/ no relief. +nausea, has drain in place.  +diarrhea.  Denies fever.

## 2011-07-25 MED ORDER — PREGABALIN 50 MG PO CAPS
100.0000 mg | ORAL_CAPSULE | Freq: Two times a day (BID) | ORAL | Status: DC
  Administered 2011-07-25 – 2011-07-26 (×3): 100 mg via ORAL
  Filled 2011-07-25 (×3): qty 2

## 2011-07-25 MED ORDER — POTASSIUM CHLORIDE CRYS ER 20 MEQ PO TBCR
40.0000 meq | EXTENDED_RELEASE_TABLET | Freq: Once | ORAL | Status: AC
  Administered 2011-07-25: 40 meq via ORAL
  Filled 2011-07-25: qty 2

## 2011-07-25 MED ORDER — HYDROMORPHONE HCL PF 1 MG/ML IJ SOLN
1.0000 mg | INTRAMUSCULAR | Status: DC | PRN
  Administered 2011-07-25 – 2011-07-28 (×18): 1 mg via INTRAVENOUS
  Filled 2011-07-25: qty 2
  Filled 2011-07-25 (×18): qty 1

## 2011-07-25 MED ORDER — GLUCERNA SHAKE PO LIQD
237.0000 mL | Freq: Three times a day (TID) | ORAL | Status: DC
  Administered 2011-07-25 (×2): 237 mL via ORAL
  Filled 2011-07-25 (×4): qty 237

## 2011-07-25 MED ORDER — PANCRELIPASE (LIP-PROT-AMYL) 12000-38000 UNITS PO CPEP
2.0000 | ORAL_CAPSULE | Freq: Three times a day (TID) | ORAL | Status: DC
  Administered 2011-07-25 – 2011-07-26 (×2): 2 via ORAL
  Filled 2011-07-25 (×2): qty 2

## 2011-07-25 NOTE — Progress Notes (Signed)
lunch today patient eating low fat diet after a couple of bites She started crying with acute pain scale 9 of 10 pain meds given

## 2011-07-25 NOTE — H&P (Signed)
Jordan Haney is an 55 y.o. female.   Chief Complaint: Abdominal pain HPI: Patient is well-known to me with her recent history of a antrectomy with Roux-en-Y*jejunostomy and choledochoduodenostomy secondarily to a common bile duct injury. She is actually seen in my office this week with continued pain issues in the epigastric area. Workup earlier in the emergency department had demonstrated improvement of intra-abdominal findings and 2 abdominal fluid collections. Her white blood cell count has also remained normal during her most recent evaluations. Therefore in the office she was given a course of oral Dilaudid to see if her pain could be better controlled. Unfortunately her pain continued to progress and she returned to the emergency department. Pain has persisted in the epigastric region. It is exacerbated with some by mouth intake. She has not noted any decrease in symptomatology with administration of Creon with her diet. She has also stayed on a low-fat diet with no significant change in her symptomatology. Her bowel function has remained normal with no issues of diarrhea. She has not noted any increased drainage from her midline incision or from her JP drain.  Past Medical History  Diagnosis Date  . RA (rheumatoid arthritis)  2008  . MI (myocardial infarction) 2008    Not well documented, patient reports reassuring cardiac catheterization and stress testing in [place]  . Constipation 03/21/2011  . Hypokalemia 03/21/2011  . Fatty liver 03/21/2011  . Pancreatitis     states 3 years ago, very severe, ?biliary pancreatitis   . T12 compression fracture 2011  . Duodenal ulcer     remote per patient. +BC powders, patient report negative EGD six months ago  . Dilated pancreatic duct     ?chronic pancreatitis, EUS pending (06/02/11)    Past Surgical History  Procedure Date  . Cholecystectomy   . Knee surgery   . Appendectomy   . Complete hysterectomy   . Ventral wall hernia   .  Esophagogastroduodenoscopy 08/2010    Dr. Samuella Cota, Versed. small hh, mild prepylori gastritis. path unavailable  . Esophagogastroduodenoscopy 04/2010    Dr. Samuella Cota, Versed. small hh, mild distal esophagitis, antral gastritis and duodenitis. Bx mild chronic gastritis and no H.pylori  . Esophagogastroduodenoscopy 08/2009    Dr. Allena Katz, Versed. Moderate gastritis, moderate duodenitis with nodularity in proximal duodenal bulb, bx chronic gastritis, no helicobacter, mild chronic duodenitis  . Esophagogastroduodenoscopy 02/2007    Dr. Samuella Cota, Versed. antral gastritis and duodenal ulcers. Bx mild chronic gastritis. No bx from duodenal ulcer available.  Gaspar Bidding dilation 04/06/2011    Procedure: SAVORY DILATION;  Surgeon: Arlyce Harman, MD;  Location: [facility];  Service: Endoscopy;  Laterality: N/A;  16 mm  . Esophagogastroduodenoscopy 04/06/11    Dr. Darrick Penna: 1-2 cm hiatal hernia, mod gastritis, 75mmX3mm linear ulcer in duodenal bulb, stricture 1st part of duodenum, dilated to 12 mm  . Esophagogastroduodenoscopy 05/12/11    partial gastric oulet obstruction secondary to stricture between bulb/2nd portion of duodenum with marked friability and inflammation but no discrete ulcer, dilated stomach. s/p dilation but high risk for restenosis  . Esophagogastroduodenoscopy 06/22/2011    Procedure: ESOPHAGOGASTRODUODENOSCOPY (EGD);  Surgeon: West Bali, MD;  Location: [facility];  Service: Endoscopy;  Laterality: N/A;  . Gastrojejunostomy 06/28/2011    Procedure: GASTROJEJUNOSTOMY;  Surgeon: Fabio Bering, MD;  Location: [facility];  Service: General;  Laterality: N/A;  Roux-en-y, Choledocalduodenostomy  . Abdominal hysterectomy     Family History  Problem Relation Age of Onset  . Colon cancer Neg  Hx   . Liver cancer Neg Hx   . Breast cancer Neg Hx   . Inflammatory bowel disease Neg Hx   . Heart attack Mother    Social History:  reports that she has never smoked. She does not have any smokeless tobacco history on  file. She reports that she does not drink alcohol or use illicit drugs.  Allergies:  Allergies  Allergen Reactions  . Compazine Anaphylaxis  . Promethazine Hcl Anaphylaxis  . Vistaril (Hydroxyzine Hcl) Other (See Comments)    Reaction: legs shake  . Gabapentin Other (See Comments)    Reactions: legs shake  . Latex Swelling    Medications Prior to Admission  Medication Sig Dispense Refill  . amLODipine (NORVASC) 5 MG tablet Take 1 tablet (5 mg total) by mouth daily.  30 tablet  0  . amoxicillin-clavulanate (AUGMENTIN) 875-125 MG per tablet Take 1 tablet by mouth every 12 (twelve) hours.  20 tablet  0  . aspirin 325 MG tablet Take 1 tablet (325 mg total) by mouth daily.  30 tablet  2  . diazepam (VALIUM) 5 MG tablet Take 5 mg by mouth every 8 (eight) hours as needed. For anxiety      . docusate sodium (COLACE) 100 MG capsule Take 100 mg by mouth daily.      Marland Kitchen HYDROmorphone (DILAUDID) 2 MG tablet Take 2 mg by mouth every 4 (four) hours.      Marland Kitchen ibuprofen (ADVIL,MOTRIN) 200 MG tablet Take 200 mg by mouth every 8 (eight) hours as needed. For pain      . lipase/protease/amylase (CREON-10/PANCREASE) 12000 UNITS CPEP Take 1 capsule by mouth 3 (three) times daily before meals.      Marland Kitchen adalimumab (HUMIRA) 40 MG/0.8ML injection Inject 40 mg into the skin every 7 (seven) days.         Results for orders placed during the hospital encounter of 07/24/11 (from the past 48 hour(s))  CBC     Status: Abnormal   Collection Time   07/24/11  1:10 PM      Component Value Range Comment   WBC 6.0  4.0 - 10.5 (K/uL)    RBC 3.87  3.87 - 5.11 (MIL/uL)    Hemoglobin 10.4 (*) 12.0 - 15.0 (g/dL)    HCT 09.8 (*) 11.9 - 46.0 (%)    MCV 85.5  78.0 - 100.0 (fL)    MCH 26.9  26.0 - 34.0 (pg)    MCHC 31.4  30.0 - 36.0 (g/dL)    RDW 14.7 (*) 82.9 - 15.5 (%)    Platelets 691 (*) 150 - 400 (K/uL)   DIFFERENTIAL     Status: Normal   Collection Time   07/24/11  1:10 PM      Component Value Range Comment   Neutrophils  Relative 61  43 - 77 (%)    Neutro Abs 3.7  1.7 - 7.7 (K/uL)    Lymphocytes Relative 25  12 - 46 (%)    Lymphs Abs 1.5  0.7 - 4.0 (K/uL)    Monocytes Relative 9  3 - 12 (%)    Monocytes Absolute 0.6  0.1 - 1.0 (K/uL)    Eosinophils Relative 3  0 - 5 (%)    Eosinophils Absolute 0.2  0.0 - 0.7 (K/uL)    Basophils Relative 1  0 - 1 (%)    Basophils Absolute 0.1  0.0 - 0.1 (K/uL)   COMPREHENSIVE METABOLIC PANEL     Status: Abnormal  Collection Time   07/24/11  1:10 PM      Component Value Range Comment   Sodium 138  135 - 145 (mEq/L)    Potassium 2.9 (*) 3.5 - 5.1 (mEq/L)    Chloride 97  96 - 112 (mEq/L)    CO2 29  19 - 32 (mEq/L)    Glucose, Bld 98  70 - 99 (mg/dL)    BUN <3 (*) 6 - 23 (mg/dL) REPEATED TO VERIFY   Creatinine, Ser 0.40 (*) 0.50 - 1.10 (mg/dL)    Calcium 9.0  8.4 - 10.5 (mg/dL)    Total Protein 6.9  6.0 - 8.3 (g/dL)    Albumin 2.6 (*) 3.5 - 5.2 (g/dL)    AST 26  0 - 37 (U/L)    ALT 18  0 - 35 (U/L)    Alkaline Phosphatase 306 (*) 39 - 117 (U/L)    Total Bilirubin 0.7  0.3 - 1.2 (mg/dL)    GFR calc non Af Amer >90  >90 (mL/min)    GFR calc Af Amer >90  >90 (mL/min)   LIPASE, BLOOD     Status: Normal   Collection Time   07/24/11  1:10 PM      Component Value Range Comment   Lipase 36  11 - 59 (U/L)    No results found.  Review of Systems  Constitutional: Negative for fever, chills, weight loss, malaise/fatigue and diaphoresis.  HENT: Negative.  Negative for hearing loss.   Respiratory: Negative.   Cardiovascular: Negative.   Gastrointestinal: Positive for heartburn, nausea and abdominal pain (epigastric). Negative for vomiting, diarrhea (resolved), constipation, blood in stool and melena.  Genitourinary: Negative.   Musculoskeletal: Negative.   Skin: Negative.   Neurological: Negative.  Negative for weakness and headaches.  Endo/Heme/Allergies: Negative.   Psychiatric/Behavioral: Negative.     Blood pressure 135/86, pulse 86, temperature 98.9 F (37.2 C),  temperature source Oral, resp. rate 18, height 5\' 4"  (1.626 m), weight 61.236 kg (135 lb), SpO2 94.00%. Physical Exam  Constitutional: She is oriented to person, place, and time. She appears well-developed. No distress.       Thin, uncomfortable in appearance. Slightly disheveled.  HENT:  Head: Normocephalic and atraumatic.  Eyes: Conjunctivae and EOM are normal. Pupils are equal, round, and reactive to light. No scleral icterus.  Neck: Normal range of motion. Neck supple. No tracheal deviation present. No thyromegaly present.  Cardiovascular: Normal rate, regular rhythm and normal heart sounds.   Respiratory: Effort normal and breath sounds normal. No respiratory distress. She has no wheezes.  GI: Soft. Bowel sounds are normal. She exhibits no distension and no mass. There is tenderness (moderate to severe epigastric abdominal tenderness.). There is no rebound and no guarding.       Abdomen remains flat. Midline incision has some remaining staples in position. There is still an open draining wound at the inferior aspect of the midline incision. Discharge does not have any odor. The drainage does have a slight purulent appearance. JP drain demonstrates serous drainage with slight brown tinged.  Lymphadenopathy:    She has no cervical adenopathy.  Neurological: She is alert and oriented to person, place, and time.  Skin: Skin is warm and dry.     Assessment/Plan Abdominal pain. I still have a high suspicion of chronic and acute inflammation of her pancreas. She does have a significant history of chronic pancreatitis and her symptoms would be suggestive of a chronic pancreatitis. However given her recent surgery a surgical  etiology is still expected. Given her recent laboratory evaluation I have a low suspicion of a intra-abdominal abscess causing all of her symptomatology. I also have a extremely low suspicion of the anastomotic leak given her previous CT evaluation and her continued course however  this is still on my potential list of etiology. At this point I will back the patient down to a full liquid diet while remaining on a low-fat diet as well. I will increase her Creon to see if she has some improvement of her symptomatology with supplementation of pancreatic enzymes. I have obtained cultures of her midline incisional drainage although I do feel this is likely again returned sterile. I also discussed with the patient a trial of Lyrica as she has not tolerated Neurontin in the past but has tolerated Lyrica for a more neuropathic type of pain. Her pain has improved currently on the IV Dilaudid however as I have expressed the patient I am limited on an oral regiment for outpatient management.  Aretha Levi C 07/25/2011, 2:19 PM

## 2011-07-26 LAB — COMPREHENSIVE METABOLIC PANEL
Alkaline Phosphatase: 287 U/L — ABNORMAL HIGH (ref 39–117)
BUN: <3 mg/dL — ABNORMAL LOW (ref 6–23)
Chloride: 100 mEq/L (ref 96–112)
Creatinine, Ser: 0.36 mg/dL — ABNORMAL LOW (ref 0.50–1.10)
GFR calc Af Amer: >90 mL/min (ref >90)
Glucose, Bld: 114 mg/dL — ABNORMAL HIGH (ref 70–99)
Potassium: 3.8 mEq/L (ref 3.5–5.1)
Total Bilirubin: 0.6 mg/dL (ref 0.3–1.2)

## 2011-07-26 MED ORDER — PREGABALIN 75 MG PO CAPS
200.0000 mg | ORAL_CAPSULE | Freq: Two times a day (BID) | ORAL | Status: DC
  Administered 2011-07-26 – 2011-07-28 (×4): 200 mg via ORAL
  Filled 2011-07-26 (×2): qty 1
  Filled 2011-07-26: qty 2
  Filled 2011-07-26: qty 1

## 2011-07-26 MED ORDER — PANCRELIPASE (LIP-PROT-AMYL) 12000-38000 UNITS PO CPEP
2.0000 | ORAL_CAPSULE | Freq: Three times a day (TID) | ORAL | Status: DC
  Administered 2011-07-26 – 2011-07-28 (×7): 2 via ORAL
  Filled 2011-07-26 (×4): qty 2
  Filled 2011-07-26: qty 1
  Filled 2011-07-26 (×2): qty 2

## 2011-07-26 NOTE — Progress Notes (Signed)
  Subjective: Pain is still present but somewhat improved. She did have slight increase in pain with breakfast this morning but not as severe as previous meals. She denies any fevers or chills. No nausea.  Objective: Vital signs in last 24 hours: Temp:  [97.5 F (36.4 C)-98.9 F (37.2 C)] 97.9 F (36.6 C) (05/06 1008) Pulse Rate:  [80-94] 85  (05/06 1008) Resp:  [18-19] 18  (05/06 1008) BP: (123-151)/(79-94) 130/89 mmHg (05/06 1008) SpO2:  [92 %-95 %] 95 % (05/06 1008) FiO2 (%):  [96 %] 96 % (05/05 1956) Last BM Date: 07/22/11  Intake/Output from previous day: 05/05 0701 - 05/06 0700 In: 3552 [P.O.:540; I.V.:3012] Out: 1210 [Urine:1200; Drains:10] Intake/Output this shift: Total I/O In: 210 [P.O.:210] Out: 650 [Urine:650]  General appearance: alert and Uncomfortable in appearance Resp: clear to auscultation bilaterally Cardio: regular rate and rhythm GI: Positive bowel sounds, soft, flat, moderate epigastric tenderness. No diffuse peritoneal signs. Midline incision was still some slight drainage from the lower midline incision. Still somewhat purulent in appearance. JP drain demonstrates serous drainage with scant amounts of fluid within bulb.  Lab Results:   Basename 07/24/11 1310  WBC 6.0  HGB 10.4*  HCT 33.1*  PLT 691*   BMET  Basename 07/24/11 1310  NA 138  K 2.9*  CL 97  CO2 29  GLUCOSE 98  BUN <3*  CREATININE 0.40*  CALCIUM 9.0   PT/INR No results found for this basename: LABPROT:2,INR:2 in the last 72 hours ABG No results found for this basename: PHART:2,PCO2:2,PO2:2,HCO3:2 in the last 72 hours  Studies/Results: No results found.  Anti-infectives: Anti-infectives    None      Assessment/Plan: s/p * No surgery found * Abdominal pain. I do suspect that this time this is an exacerbation of the patient's history of chronic pancreatitis. Given her surgical intervention disc very well could have exacerbated some of her symptomatology. While her  amylase and lipase have been within normal levels this could very well represent her history of chronic pancreatitis which she reports to me. I will increase both patients current dose of Lyrica as well as her Creon dosage. Continue low-fat diet. Continue local wound care. Continue monitoring JP output.  LOS: 2 days    Andriy Sherk C 07/26/2011

## 2011-07-26 NOTE — Progress Notes (Signed)
INITIAL ADULT NUTRITION ASSESSMENT Date: 07/26/2011   Time: 12:01 PM Reason for Assessment: Nutrition Risk Screen (wt loss)  ASSESSMENT: Female 56 y.o.  Pt c/o: Abdominal Pain   Past Medical History  Diagnosis Date  . RA (rheumatoid arthritis)  2008  . MI (myocardial infarction) 2008    Not well documented, patient reports reassuring cardiac catheterization and stress testing in [place]  . Constipation 03/21/2011  . Hypokalemia 03/21/2011  . Fatty liver 03/21/2011  . Pancreatitis     states 3 years ago, very severe, ?biliary pancreatitis   . T12 compression fracture 2011  . Duodenal ulcer     remote per patient. +BC powders, patient report negative EGD six months ago  . Dilated pancreatic duct     ?chronic pancreatitis, EUS pending (06/02/11)    Scheduled Meds:   . celecoxib  200 mg Oral BID  . enoxaparin (LOVENOX) injection  40 mg Subcutaneous Q24H  . feeding supplement  237 mL Oral TID BM  . lipase/protease/amylase  2 capsule Oral TID WC  . pantoprazole  40 mg Oral Q1200  . potassium chloride  40 mEq Oral Once  . pregabalin  200 mg Oral BID  . DISCONTD: lipase/protease/amylase  1 capsule Oral TID AC  . DISCONTD: lipase/protease/amylase  2 capsule Oral TID AC  . DISCONTD: pregabalin  100 mg Oral BID   Continuous Infusions:   . lactated ringers 100 mL/hr at 07/25/11 2136   PRN Meds:.diazepam, HYDROmorphone (DILAUDID) injection, ondansetron (ZOFRAN) IV, DISCONTD:  HYDROmorphone (DILAUDID) injection  Ht: 5\' 4"  (162.6 cm)  Wt: 135 lb (61.236 kg)  Ideal Wt: 54.7 kg  % Ideal Wt: 112%  Usual Wt: 142# (06/20/11), 154.12# (07/12/11) significantly variable UBW %: 95%  Body mass index is 23.17 kg/(m^2). Normal  Food/Nutrition Related Hx: Pt s/p antrectomy and gastrojejunostomy 06/28/11. Re-admitted c/o abdominal pain. Pt was tol Low-fat diet at d/c. Suspected exacerbation of chronic Pancreatitis. Creon increased and Low fat diet to cont per MD note.   CMP     Component  Value Date/Time   NA 139 07/26/2011 0955   K 3.8 07/26/2011 0955   CL 100 07/26/2011 0955   CO2 31 07/26/2011 0955   GLUCOSE 114* 07/26/2011 0955   BUN <3* 07/26/2011 0955   CREATININE 0.36* 07/26/2011 0955   CALCIUM 9.3 07/26/2011 0955   PROT 7.3 07/26/2011 0955   ALBUMIN 2.4* 07/26/2011 0955   AST 26 07/26/2011 0955   ALT 12 07/26/2011 0955   ALKPHOS 287* 07/26/2011 0955   BILITOT 0.6 07/26/2011 0955   GFRNONAA >90 07/26/2011 0955   GFRAA >90 07/26/2011 0955    Intake/Output Summary (Last 24 hours) at 07/26/11 1210 Last data filed at 07/26/11 0954  Gross per 24 hour  Intake   3762 ml  Output   1860 ml  Net   1902 ml    Diet Order: Full Liquid  Supplements/Tube Feeding: Glucerna Shake TID   IVF:    lactated ringers Last Rate: 100 mL/hr at 07/25/11 2136    Estimated Nutritional Needs:   Kcal:1800-2100 kcal per day Protein:85-97 grams per day Fluid:1 ml per kcal  NUTRITION DIAGNOSIS: -Altered GI function (NI-1.4).  Status: Ongoing  RELATED TO: recent antrectomy and gastrojejunostomy,  chronic pancreatitis  AS EVIDENCE BY: pt hx  MONITORING/EVALUATION(Goals): -monitor diet advancement, po intake of meals and oral supplement, labs -Pt will tol low fat diet and Glucerna to meet minimum est nutritional needs  EDUCATION NEEDS: -Education not appropriate at this time  INTERVENTION: -RD  to monitor for nutrition needs  Dietitian [phone]  DOCUMENTATION CODES Per approved criteria  -Not Applicable    Francene Boyers 07/26/2011, 12:01 PM

## 2011-07-27 MED ORDER — SODIUM CHLORIDE 0.9 % IJ SOLN
INTRAMUSCULAR | Status: AC
  Administered 2011-07-27: 10 mL
  Filled 2011-07-27: qty 3

## 2011-07-27 NOTE — Care Management Note (Signed)
    Page 1 of 1   07/29/2011     8:21:39 AM   CARE MANAGEMENT NOTE 07/29/2011  Patient:  Jordan Haney, Jordan Haney   Account Number:  [id number]  Date Initiated:  07/27/2011  Documentation initiated by:  Sharrie Rothman  Subjective/Objective Assessment:   Pt admitted from home with chronic pancreatitis and pain control issues. Pt active with [facility] Rn and aide.     Action/Plan:   CM spoke with pt and pts pain is much better and wants to go home with [facility]. Will continue to follow.   Anticipated DC Date:  07/24/2011   Anticipated DC Plan:  HOME W HOME HEALTH SERVICES      DC Planning Services  CM consult      [facility] Choice  HOME HEALTH   Choice offered to / List presented to:  C-1 Patient        HH arranged  HH-1 RN      [facility] agency  [facility]   Status of service:  Completed, signed off Medicare Important Message given?   (If response is "NO", the following Medicare IM given date fields will be blank) Date Medicare IM given:   Date Additional Medicare IM given:    Discharge Disposition:  HOME W HOME HEALTH SERVICES  Per UR Regulation:    If discussed at Long Length of Stay Meetings, dates discussed:    Comments:  07/29/11 0817 Arlyss Queen, RN BSN CM Pt discharged home on 07/28/11 with [facility] for RN for wound care and drain care. Pt orders and face to face sent to [facility] in [place]. Pt and pts nurse is aware of discharge instructions. Pt HH services will start on 07/29/11.  07/27/11 1515 Arlyss Queen, RN BSN CM

## 2011-07-27 NOTE — Progress Notes (Signed)
  Subjective: Pain has improved.  Appetite increased.  No significant issues with AM diet.  Objective: Vital signs in last 24 hours: Temp:  [97.6 F (36.4 C)-98.9 F (37.2 C)] 98.4 F (36.9 C) (05/07 1342) Pulse Rate:  [70-88] 88  (05/07 1342) Resp:  [18-20] 20  (05/07 1342) BP: (106-131)/(73-91) 106/73 mmHg (05/07 1342) SpO2:  [90 %-96 %] 95 % (05/07 1342) FiO2 (%):  [93 %] 93 % (05/07 1159) Last BM Date: 07/26/11  Intake/Output from previous day: 05/06 0701 - 05/07 0700 In: 2750 [P.O.:450; I.V.:2300] Out: 2220 [Urine:2200; Drains:20] Intake/Output this shift:    General appearance: alert and no distress GI: +BS, soft, flat.  Moderate epigastric tenderness.  No peritoneal signs.  Scant JP drainage.  Midline with minimal drainage.    Lab Results:  No results found for this basename: WBC:2,HGB:2,HCT:2,PLT:2 in the last 72 hours BMET  [facility] 07/26/11 0955  NA 139  K 3.8  CL 100  CO2 31  GLUCOSE 114*  BUN <3*  CREATININE 0.36*  CALCIUM 9.3   PT/INR No results found for this basename: LABPROT:2,INR:2 in the last 72 hours ABG No results found for this basename: PHART:2,PCO2:2,PO2:2,HCO3:2 in the last 72 hours  Studies/Results: No results found.  Anti-infectives: Anti-infectives    None      Assessment/Plan: s/p * No surgery found * Suspect resolving acute/chronic pancreatitis.  Will transition to oral pain medications.  Advance to low fat diet.  Anticitpate discharge tomorrow.  LOS: 3 days    Gabryela Kimbrell C 07/27/2011

## 2011-07-28 MED ORDER — PANTOPRAZOLE SODIUM 40 MG PO TBEC: 40.0000 mg | DELAYED_RELEASE_TABLET | Freq: Every day | ORAL | Status: DC

## 2011-07-28 MED ORDER — PANCRELIPASE (LIP-PROT-AMYL) 12000-38000 UNITS PO CPEP: 2.0000 | ORAL_CAPSULE | Freq: Three times a day (TID) | ORAL | Status: DC

## 2011-07-28 MED ORDER — PREGABALIN 200 MG PO CAPS: 200.0000 mg | ORAL_CAPSULE | Freq: Two times a day (BID) | ORAL | Status: DC

## 2011-07-28 MED ORDER — CELECOXIB 200 MG PO CAPS: 200.0000 mg | ORAL_CAPSULE | Freq: Two times a day (BID) | ORAL | Status: DC

## 2011-07-28 MED ORDER — DIAZEPAM 5 MG PO TABS: 5.0000 mg | ORAL_TABLET | Freq: Three times a day (TID) | ORAL | Status: DC | PRN

## 2011-07-28 MED ORDER — HYDROMORPHONE HCL 2 MG PO TABS: 2.0000 mg | ORAL_TABLET | ORAL | Status: DC

## 2011-07-28 NOTE — Discharge Instructions (Signed)
[facility] registered nurse for incision and JP drain care and nursing aide for assistance with ADL's.

## 2011-07-28 NOTE — Discharge Summary (Signed)
Pt dc'd to home with RX and dc materials including f/u appt. Pt was accompanied by spouse.

## 2011-07-28 NOTE — Discharge Summary (Signed)
Physician Discharge Summary  Patient ID: Jordan Haney MRN: [id number] DOB/AGE: [birth year] 56 y.o.  Admit date: 07/24/2011 Discharge date: 07/28/2011  Admission Diagnoses: Poorly controlled abdominal pain  Discharge Diagnoses: Chronic pancreatitis Active Problems:  * No active hospital problems. *    Discharged Condition: stable  Hospital Course: Patient presented with increasing abdominal pain to [facility] emergency department. Pain was different than her initial postoperative pain and she continue to have worsening epigastric pain. Given patient's history and clinical presentation my suspicion is highest for an acute episode of her chronic pancreatitis. She was admitted for continued pain management, IV fluid hydration, and nutritional support. Patient's pain continued to improve after modifying her pain regimen and she was advanced on her diet. She is currently tolerating her diet. She feels much better. She has minimal drainage out of her JP drain. She's having normal bowel function. Patient does wish to be discharged.  Consults: None  Significant Diagnostic Studies: labs: CBC Chem-7  Treatments: IV hydration and analgesia: Dilaudid  Discharge Exam: Blood pressure 128/85, pulse 95, temperature 98.7 F (37.1 C), temperature source Oral, resp. rate 20, height 5\' 4"  (1.626 m), weight 61.236 kg (135 lb), SpO2 93.00%. General appearance: alert and no distress Eyes: Pupils equal round reactive, extraocular movements are intact, no scleral icterus is noted Resp: clear to auscultation bilaterally Cardio: regular rate and rhythm GI: Positive bowel sounds, soft, flat, mild to moderate epigastric abdominal tenderness. No diffuse peritoneal signs. Midline incision demonstrates minimal drainage from the lower aspect. JP drainage remains brown tinged serous discharge. Minimal output the JP.  Disposition: 01-Home or Self Care  Discharge Orders    Future Orders Please Complete By Expires   Diet - low sodium heart healthy      Increase activity slowly      Discharge instructions      Comments:   Increase activity as tolerated. May place ice pack for comfort.   Lifting restrictions      Comments:   No lifting over 20lbs for 4-5 weeks post-op.    Driving Restrictions      Comments:   No driving while on pain medications.    Discharge wound care:      Comments:   Soap and water.  Change dressing 2x daily and as needed.   Call MD for:  temperature >100.4      Call MD for:  persistant nausea and vomiting      Call MD for:  severe uncontrolled pain      Call MD for:  redness, tenderness, or signs of infection (pain, swelling, redness, odor or green/yellow discharge around incision site)        Medication List  As of 07/28/2011  1:10 PM   STOP taking these medications         amoxicillin-clavulanate 875-125 MG per tablet      ibuprofen 200 MG tablet         TAKE these medications         amLODipine 5 MG tablet   Commonly known as: NORVASC   Take 1 tablet (5 mg total) by mouth daily.      aspirin 325 MG tablet   Take 1 tablet (325 mg total) by mouth daily.      celecoxib 200 MG capsule   Commonly known as: CELEBREX   Take 1 capsule (200 mg total) by mouth 2 (two) times daily.      diazepam 5 MG tablet   Commonly known as: VALIUM  Take 1 tablet (5 mg total) by mouth every 8 (eight) hours as needed.      docusate sodium 100 MG capsule   Commonly known as: COLACE   Take 100 mg by mouth daily.      HUMIRA 40 MG/0.8ML injection   Generic drug: adalimumab   Inject 40 mg into the skin every 7 (seven) days.      HYDROmorphone 2 MG tablet   Commonly known as: DILAUDID   Take 1 tablet (2 mg total) by mouth every 4 (four) hours.      lipase/protease/amylase 04540 UNITS Cpep   Commonly known as: CREON-10/PANCREASE   Take 2 capsules by mouth 3 (three) times daily with meals.      pantoprazole 40 MG tablet   Commonly known as: PROTONIX   Take 1 tablet (40 mg  total) by mouth daily at 12 noon.      pregabalin 200 MG capsule   Commonly known as: LYRICA   Take 1 capsule (200 mg total) by mouth 2 (two) times daily.           Follow-up Information    Follow up with Bram Hottel C, MD in 1 week.   Contact information:   [address] [place] [address] [phone]          Signed: Fabio Bering 07/28/2011, 1:10 PM

## 2011-07-28 NOTE — Progress Notes (Signed)
UR Chart Review Completed  

## 2011-07-31 LAB — WOUND CULTURE: Gram Stain: NONE SEEN

## 2011-08-04 ENCOUNTER — Emergency Department (HOSPITAL_COMMUNITY)

## 2011-08-04 ENCOUNTER — Inpatient Hospital Stay (HOSPITAL_COMMUNITY)
Admission: EM | Admit: 2011-08-04 | Discharge: 2011-08-07 | DRG: 440 | Disposition: A | Discharge status: Home Health | Attending: General Surgery | Admitting: General Surgery

## 2011-08-04 ENCOUNTER — Encounter (HOSPITAL_COMMUNITY): Payer: Self-pay | Admitting: *Deleted

## 2011-08-04 DIAGNOSIS — R110 Nausea: Secondary | ICD-10-CM | POA: Diagnosis present

## 2011-08-04 DIAGNOSIS — E876 Hypokalemia: Secondary | ICD-10-CM | POA: Diagnosis present

## 2011-08-04 DIAGNOSIS — K859 Acute pancreatitis without necrosis or infection, unspecified: Principal | ICD-10-CM | POA: Diagnosis present

## 2011-08-04 DIAGNOSIS — Z903 Acquired absence of stomach [part of]: Secondary | ICD-10-CM

## 2011-08-04 DIAGNOSIS — R1013 Epigastric pain: Secondary | ICD-10-CM | POA: Diagnosis present

## 2011-08-04 DIAGNOSIS — E8809 Other disorders of plasma-protein metabolism, not elsewhere classified: Secondary | ICD-10-CM | POA: Diagnosis present

## 2011-08-04 DIAGNOSIS — M069 Rheumatoid arthritis, unspecified: Secondary | ICD-10-CM | POA: Diagnosis present

## 2011-08-04 DIAGNOSIS — I252 Old myocardial infarction: Secondary | ICD-10-CM

## 2011-08-04 DIAGNOSIS — K861 Other chronic pancreatitis: Secondary | ICD-10-CM | POA: Diagnosis present

## 2011-08-04 LAB — DIFFERENTIAL
Eosinophils Absolute: 0.4 10*3/uL (ref 0.0–0.7)
Eosinophils Relative: 7 % — ABNORMAL HIGH (ref 0–5)
Lymphs Abs: 1.6 10*3/uL (ref 0.7–4.0)
Monocytes Absolute: 0.4 10*3/uL (ref 0.1–1.0)

## 2011-08-04 LAB — COMPREHENSIVE METABOLIC PANEL
BUN: 3 mg/dL — ABNORMAL LOW (ref 6–23)
Calcium: 9 mg/dL (ref 8.4–10.5)
Creatinine, Ser: 0.43 mg/dL — ABNORMAL LOW (ref 0.50–1.10)
GFR calc Af Amer: >90 mL/min (ref >90)
GFR calc non Af Amer: >90 mL/min (ref >90)
Glucose, Bld: 85 mg/dL (ref 70–99)
Sodium: 140 mEq/L (ref 135–145)
Total Protein: 6.4 g/dL (ref 6.0–8.3)

## 2011-08-04 LAB — CBC
HCT: 29.1 % — ABNORMAL LOW (ref 36.0–46.0)
MCH: 26.2 pg (ref 26.0–34.0)
MCV: 82 fL (ref 78.0–100.0)
Platelets: 328 10*3/uL (ref 150–400)
RBC: 3.55 MIL/uL — ABNORMAL LOW (ref 3.87–5.11)

## 2011-08-04 LAB — LIPASE, BLOOD: Lipase: 18 U/L (ref 11–59)

## 2011-08-04 MED ORDER — HYDROMORPHONE HCL PF 1 MG/ML IJ SOLN
1.0000 mg | Freq: Once | INTRAMUSCULAR | Status: AC
  Administered 2011-08-04: 1 mg via INTRAVENOUS
  Filled 2011-08-04: qty 1

## 2011-08-04 MED ORDER — PANTOPRAZOLE SODIUM 40 MG IV SOLR
40.0000 mg | Freq: Every day | INTRAVENOUS | Status: DC
  Administered 2011-08-05 (×2): 40 mg via INTRAVENOUS
  Filled 2011-08-04 (×2): qty 40

## 2011-08-04 MED ORDER — DIPHENHYDRAMINE HCL 50 MG/ML IJ SOLN: 12.5000 mg | Freq: Four times a day (QID) | INTRAMUSCULAR | Status: DC | PRN

## 2011-08-04 MED ORDER — KCL IN DEXTROSE-NACL 40-5-0.45 MEQ/L-%-% IV SOLN
INTRAVENOUS | Status: DC
  Administered 2011-08-05 – 2011-08-07 (×7): via INTRAVENOUS

## 2011-08-04 MED ORDER — POTASSIUM CHLORIDE 10 MEQ/100ML IV SOLN
10.0000 meq | INTRAVENOUS | Status: AC
  Administered 2011-08-05 (×4): 10 meq via INTRAVENOUS
  Filled 2011-08-04: qty 100
  Filled 2011-08-04: qty 200
  Filled 2011-08-04: qty 100

## 2011-08-04 MED ORDER — ONDANSETRON HCL 4 MG/2ML IJ SOLN
4.0000 mg | Freq: Once | INTRAMUSCULAR | Status: AC
  Administered 2011-08-04: 4 mg via INTRAVENOUS
  Filled 2011-08-04: qty 2

## 2011-08-04 MED ORDER — ONDANSETRON HCL 4 MG/2ML IJ SOLN: 4.0000 mg | Freq: Four times a day (QID) | INTRAMUSCULAR | Status: DC | PRN

## 2011-08-04 MED ORDER — IOHEXOL 300 MG/ML  SOLN
100.0000 mL | Freq: Once | INTRAMUSCULAR | Status: AC | PRN
  Administered 2011-08-04: 100 mL via INTRAVENOUS

## 2011-08-04 MED ORDER — SODIUM CHLORIDE 0.9 % IV BOLUS (SEPSIS)
1000.0000 mL | Freq: Once | INTRAVENOUS | Status: AC
  Administered 2011-08-04: 1000 mL via INTRAVENOUS

## 2011-08-04 MED ORDER — HYDROMORPHONE HCL PF 1 MG/ML IJ SOLN
0.5000 mg | Freq: Once | INTRAMUSCULAR | Status: DC
  Filled 2011-08-04: qty 1

## 2011-08-04 MED ORDER — HYDROMORPHONE HCL PF 1 MG/ML IJ SOLN
0.5000 mg | Freq: Once | INTRAMUSCULAR | Status: AC
  Administered 2011-08-04: 0.5 mg via INTRAVENOUS
  Filled 2011-08-04: qty 1

## 2011-08-04 MED ORDER — HYDROMORPHONE HCL PF 2 MG/ML IJ SOLN
INTRAMUSCULAR | Status: AC
  Administered 2011-08-04: 0.5 mg
  Filled 2011-08-04: qty 1

## 2011-08-04 MED ORDER — SODIUM CHLORIDE 0.9 % IV SOLN: INTRAVENOUS | Status: DC

## 2011-08-04 MED ORDER — ENOXAPARIN SODIUM 40 MG/0.4ML ~~LOC~~ SOLN
40.0000 mg | SUBCUTANEOUS | Status: DC
  Administered 2011-08-05 – 2011-08-07 (×3): 40 mg via SUBCUTANEOUS
  Filled 2011-08-04 (×3): qty 0.4

## 2011-08-04 MED ORDER — LORAZEPAM 2 MG/ML IJ SOLN
1.0000 mg | INTRAMUSCULAR | Status: DC
  Administered 2011-08-05 – 2011-08-07 (×12): 1 mg via INTRAVENOUS
  Filled 2011-08-04 (×12): qty 1

## 2011-08-04 MED ORDER — HYDROMORPHONE HCL PF 1 MG/ML IJ SOLN
2.0000 mg | INTRAMUSCULAR | Status: DC | PRN
  Administered 2011-08-05 – 2011-08-07 (×13): 2 mg via INTRAVENOUS
  Filled 2011-08-04 (×10): qty 2
  Filled 2011-08-04: qty 1
  Filled 2011-08-04: qty 2

## 2011-08-04 MED ORDER — DIPHENHYDRAMINE HCL 12.5 MG/5ML PO ELIX: 12.5000 mg | ORAL_SOLUTION | Freq: Four times a day (QID) | ORAL | Status: DC | PRN

## 2011-08-04 NOTE — ED Notes (Addendum)
Pt continues to complain of nausea and pain at 7 worst since contrast drank Will notify MD

## 2011-08-04 NOTE — ED Provider Notes (Signed)
History     CSN: [id number]  Arrival date & time 08/04/11  7829   First MD Initiated Contact with Patient 08/04/11 1830      Chief Complaint  Patient presents with  . Abdominal Pain    (Consider location/radiation/quality/duration/timing/severity/associated sxs/prior treatment) HPI... status post antrectomy, duodenectomy, Roux-en-Y gastrojejunostomy approximately 6 weeks ago.  Has had complications with pancreatitis.  Patient presents with recurring abdominal pain, nausea.  No fever or chills. Unable to eat. Pain is moderate to severe. Radiates to her back. Palpation makes it worse.  Past Medical History  Diagnosis Date  . RA (rheumatoid arthritis)  2008  . MI (myocardial infarction) 2008    Not well documented, patient reports reassuring cardiac catheterization and stress testing in [place]  . Constipation 03/21/2011  . Hypokalemia 03/21/2011  . Fatty liver 03/21/2011  . Pancreatitis     states 3 years ago, very severe, ?biliary pancreatitis   . T12 compression fracture 2011  . Duodenal ulcer     remote per patient. +BC powders, patient report negative EGD six months ago  . Dilated pancreatic duct     ?chronic pancreatitis, EUS pending (06/02/11)    Past Surgical History  Procedure Date  . Cholecystectomy   . Knee surgery   . Appendectomy   . Complete hysterectomy   . Ventral wall hernia   . Esophagogastroduodenoscopy 08/2010    Dr. Samuella Cota, [place]. small hh, mild prepylori gastritis. path unavailable  . Esophagogastroduodenoscopy 04/2010    Dr. Samuella Cota, [place]. small hh, mild distal esophagitis, antral gastritis and duodenitis. Bx mild chronic gastritis and no H.pylori  . Esophagogastroduodenoscopy 08/2009    Dr. Allena Katz, [place]. Moderate gastritis, moderate duodenitis with nodularity in proximal duodenal bulb, bx chronic gastritis, no helicobacter, mild chronic duodenitis  . Esophagogastroduodenoscopy 02/2007    Dr. Samuella Cota, [place]. antral gastritis and duodenal ulcers. Bx  mild chronic gastritis. No bx from duodenal ulcer available.  Gaspar Bidding dilation 04/06/2011    Procedure: SAVORY DILATION;  Surgeon: Arlyce Harman, MD;  Location: [facility]S;  Service: Endoscopy;  Laterality: N/A;  16 mm  . Esophagogastroduodenoscopy 04/06/11    Dr. Darrick Penna: 1-2 cm hiatal hernia, mod gastritis, 64mmX3mm linear ulcer in duodenal bulb, stricture 1st part of duodenum, dilated to 12 mm  . Esophagogastroduodenoscopy 05/12/11    partial gastric oulet obstruction secondary to stricture between bulb/2nd portion of duodenum with marked friability and inflammation but no discrete ulcer, dilated stomach. s/p dilation but high risk for restenosis  . Esophagogastroduodenoscopy 06/22/2011    Procedure: ESOPHAGOGASTRODUODENOSCOPY (EGD);  Surgeon: West Bali, MD;  Location: [facility] SUITE;  Service: Endoscopy;  Laterality: N/A;  . Gastrojejunostomy 06/28/2011    Procedure: GASTROJEJUNOSTOMY;  Surgeon: Fabio Bering, MD;  Location: [facility];  Service: General;  Laterality: N/A;  Roux-en-y, Choledocalduodenostomy  . Abdominal hysterectomy     Family History  Problem Relation Age of Onset  . Colon cancer Neg Hx   . Liver cancer Neg Hx   . Breast cancer Neg Hx   . Inflammatory bowel disease Neg Hx   . Heart attack Mother     History  Substance Use Topics  . Smoking status: Never Smoker   . Smokeless tobacco: Not on file  . Alcohol Use: No    OB History    Grav Para Term Preterm Abortions TAB SAB Ect Mult Living                  Review of Systems  All other systems  reviewed and are negative.    Allergies  Compazine; Promethazine hcl; Vistaril; Gabapentin; and Latex  Home Medications   Current Outpatient Rx  Name Route Sig Dispense Refill  . AMLODIPINE BESYLATE 5 MG PO TABS Oral Take 1 tablet (5 mg total) by mouth daily. 30 tablet 0  . ASPIRIN 325 MG PO TABS Oral Take 1 tablet (325 mg total) by mouth daily. 30 tablet 2  . CELECOXIB 200 MG PO CAPS Oral Take 1 capsule (200 mg total)  by mouth 2 (two) times daily. 60 capsule 1  . DIAZEPAM 5 MG PO TABS Oral Take 2.5-5 mg by mouth every 8 (eight) hours as needed.    Marland Kitchen DOCUSATE SODIUM 100 MG PO CAPS Oral Take 100 mg by mouth daily.    Marland Kitchen HYDROMORPHONE HCL 2 MG PO TABS Oral Take 1 tablet (2 mg total) by mouth every 4 (four) hours. 55 tablet 0  . PANCRELIPASE (LIP-PROT-AMYL) 12000 UNITS PO CPEP Oral Take 2 capsules by mouth 3 (three) times daily with meals. 270 capsule 2  . PANTOPRAZOLE SODIUM 40 MG PO TBEC Oral Take 1 tablet (40 mg total) by mouth daily at 12 noon. 30 tablet 1  . PREGABALIN 200 MG PO CAPS Oral Take 200 mg by mouth daily.      BP 99/79  Pulse 94  Temp(Src) 97.8 F (36.6 C) (Oral)  Resp 16  Ht 5\' 4"  (1.626 m)  Wt 135 lb (61.236 kg)  BMI 23.17 kg/m2  SpO2 92%  Physical Exam  Nursing note and vitals reviewed. Constitutional: She is oriented to person, place, and time. She appears well-developed and well-nourished.  HENT:  Head: Normocephalic and atraumatic.  Eyes: Conjunctivae and EOM are normal. Pupils are equal, round, and reactive to light.  Neck: Normal range of motion. Neck supple.  Cardiovascular: Normal rate and regular rhythm.   Pulmonary/Chest: Effort normal and breath sounds normal.  Abdominal:       Generalized abdominal tenderness; surgical staples vertically in the midline abdomen; draining Jackson-Pratt drain in right lower quadrant  Musculoskeletal: Normal range of motion.  Neurological: She is alert and oriented to person, place, and time.  Skin: Skin is warm and dry.  Psychiatric: She has a normal mood and affect.    ED Course  Procedures (including critical care time)  Labs Reviewed  CBC - Abnormal; Notable for the following:    RBC 3.55 (*)    Hemoglobin 9.3 (*)    HCT 29.1 (*)    RDW 16.2 (*)    All other components within normal limits  DIFFERENTIAL - Abnormal; Notable for the following:    Eosinophils Relative 7 (*)    All other components within normal limits    COMPREHENSIVE METABOLIC PANEL - Abnormal; Notable for the following:    Potassium 2.9 (*)    BUN 3 (*)    Creatinine, Ser 0.43 (*)    Albumin 2.2 (*)    AST 265 (*)    ALT 99 (*)    Alkaline Phosphatase 289 (*)    All other components within normal limits  LIPASE, BLOOD   Dg Abd Acute W/chest  08/04/2011  *RADIOLOGY REPORT*  Clinical Data: Recent partial bowel resection with increased pain.  ACUTE ABDOMEN SERIES (ABDOMEN 2 VIEW & CHEST 1 VIEW)  Comparison: CT abdomen pelvis 07/21/2011 and chest radiograph 07/03/2011.  Findings: Trachea is midline.  Heart size normal.  Minimal biapical pleural thickening.  Scattered linear densities at the lung bases are noted.  Lungs  are otherwise clear.  Right hemidiaphragm is elevated.  No definite pleural fluid.  Two views of the abdomen show a surgical drain in the right abdomen.  There are postoperative changes in the left abdomen. Mild gaseous distention of small bowel is seen in the central left abdomen.  Gas and stool in the colon, as well as rectum.  IMPRESSION:  Bowel gas pattern is indicative of a postoperative ileus.  Early or partial small bowel obstruction cannot be definitively excluded.  Original Report Authenticated By: Reyes Ivan, M.D.     No diagnosis found.    MDM  Patient's complicated. She has required several rounds of intravenous Dilaudid. CT of abdomen and pelvis pending. Dust with Dr. Franky Macho.  Admit        Donnetta Hutching, MD 08/04/11 2212

## 2011-08-04 NOTE — ED Notes (Signed)
Pt c/o upper abdominal pain radiating to back since this morning with nausea. States that she was here with pancreatitis a week ago. Denies vomiting.

## 2011-08-05 NOTE — H&P (Signed)
Jordan Haney is an 55 y.o. female.   Chief Complaint: Worsening abdominal pain and nausea HPI: Patient is a 56 year old white female who is well known to our service with worsening abdominal pain that was not controlled with by mouth Tylox and at home. She did also complain of nausea. She is status post a partial gastrectomy with repair of a common duct injury approximately 6-7 weeks ago. She has had a long and protracted course. She was recently discharged from the hospital for control of her pain and nausea. She has a history of chronic pancreatitis, which recently came to light. She was supposed to see Dr. Leticia Penna today in the office. She states that her pain had worsened over the last 24 hours and she presented emergency room with pain and nausea. A CT scan the abdomen and pelvis was ordered and showed that many of her postoperative changes within the abdomen, specifically fluid collections in the abdomen have decreased in size or are stable. There is also a decrease in bowel distention. There is no evidence of a specific bowel obstruction present.  Past Medical History  Diagnosis Date  . RA (rheumatoid arthritis)  2008  . MI (myocardial infarction) 2008    Not well documented, patient reports reassuring cardiac catheterization and stress testing in [place]  . Constipation 03/21/2011  . Hypokalemia 03/21/2011  . Fatty liver 03/21/2011  . Pancreatitis     states 3 years ago, very severe, ?biliary pancreatitis   . T12 compression fracture 2011  . Duodenal ulcer     remote per patient. +BC powders, patient report negative EGD six months ago  . Dilated pancreatic duct     ?chronic pancreatitis, EUS pending (06/02/11)    Past Surgical History  Procedure Date  . Cholecystectomy   . Knee surgery   . Appendectomy   . Complete hysterectomy   . Ventral wall hernia   . Esophagogastroduodenoscopy 08/2010    Dr. Samuella Cota, [place]. small hh, mild prepylori gastritis. path unavailable  .  Esophagogastroduodenoscopy 04/2010    Dr. Samuella Cota, [place]. small hh, mild distal esophagitis, antral gastritis and duodenitis. Bx mild chronic gastritis and no H.pylori  . Esophagogastroduodenoscopy 08/2009    Dr. Allena Katz, [place]. Moderate gastritis, moderate duodenitis with nodularity in proximal duodenal bulb, bx chronic gastritis, no helicobacter, mild chronic duodenitis  . Esophagogastroduodenoscopy 02/2007    Dr. Samuella Cota, [place]. antral gastritis and duodenal ulcers. Bx mild chronic gastritis. No bx from duodenal ulcer available.  Gaspar Bidding dilation 04/06/2011    Procedure: SAVORY DILATION;  Surgeon: Arlyce Harman, MD;  Location: [facility];  Service: Endoscopy;  Laterality: N/A;  16 mm  . Esophagogastroduodenoscopy 04/06/11    Dr. Darrick Penna: 1-2 cm hiatal hernia, mod gastritis, 3mmX3mm linear ulcer in duodenal bulb, stricture 1st part of duodenum, dilated to 12 mm  . Esophagogastroduodenoscopy 05/12/11    partial gastric oulet obstruction secondary to stricture between bulb/2nd portion of duodenum with marked friability and inflammation but no discrete ulcer, dilated stomach. s/p dilation but high risk for restenosis  . Esophagogastroduodenoscopy 06/22/2011    Procedure: ESOPHAGOGASTRODUODENOSCOPY (EGD);  Surgeon: West Bali, MD;  Location: [facility];  Service: Endoscopy;  Laterality: N/A;  . Gastrojejunostomy 06/28/2011    Procedure: GASTROJEJUNOSTOMY;  Surgeon: Fabio Bering, MD;  Location: [facility];  Service: General;  Laterality: N/A;  Roux-en-y, Choledocalduodenostomy  . Abdominal hysterectomy     Family History  Problem Relation Age of Onset  . Colon cancer Neg Hx   . Liver  cancer Neg Hx   . Breast cancer Neg Hx   . Inflammatory bowel disease Neg Hx   . Heart attack Mother    Social History:  reports that she has never smoked. She does not have any smokeless tobacco history on file. She reports that she does not drink alcohol or use illicit drugs.  Allergies:  Allergies  Allergen  Reactions  . Compazine Anaphylaxis  . Promethazine Hcl Anaphylaxis  . Vistaril (Hydroxyzine Hcl) Other (See Comments)    Reaction: legs shake  . Gabapentin Other (See Comments)    Reactions: legs shake  . Latex Swelling    Medications Prior to Admission  Medication Sig Dispense Refill  . amLODipine (NORVASC) 5 MG tablet Take 1 tablet (5 mg total) by mouth daily.  30 tablet  0  . aspirin 325 MG tablet Take 1 tablet (325 mg total) by mouth daily.  30 tablet  2  . celecoxib (CELEBREX) 200 MG capsule Take 1 capsule (200 mg total) by mouth 2 (two) times daily.  60 capsule  1  . diazepam (VALIUM) 5 MG tablet Take 2.5-5 mg by mouth every 8 (eight) hours as needed.      . docusate sodium (COLACE) 100 MG capsule Take 100 mg by mouth daily.      Marland Kitchen HYDROmorphone (DILAUDID) 2 MG tablet Take 1 tablet (2 mg total) by mouth every 4 (four) hours.  55 tablet  0  . lipase/protease/amylase (CREON-10/PANCREASE) 12000 UNITS CPEP Take 2 capsules by mouth 3 (three) times daily with meals.  270 capsule  2  . pantoprazole (PROTONIX) 40 MG tablet Take 1 tablet (40 mg total) by mouth daily at 12 noon.  30 tablet  1  . pregabalin (LYRICA) 200 MG capsule Take 200 mg by mouth daily.        Results for orders placed during the hospital encounter of 08/04/11 (from the past 48 hour(s))  CBC     Status: Abnormal   Collection Time   08/04/11  7:19 PM      Component Value Range Comment   WBC 5.7  4.0 - 10.5 (K/uL)    RBC 3.55 (*) 3.87 - 5.11 (MIL/uL)    Hemoglobin 9.3 (*) 12.0 - 15.0 (g/dL)    HCT 16.1 (*) 09.6 - 46.0 (%)    MCV 82.0  78.0 - 100.0 (fL)    MCH 26.2  26.0 - 34.0 (pg)    MCHC 32.0  30.0 - 36.0 (g/dL)    RDW 04.5 (*) 40.9 - 15.5 (%)    Platelets 328  150 - 400 (K/uL)   DIFFERENTIAL     Status: Abnormal   Collection Time   08/04/11  7:19 PM      Component Value Range Comment   Neutrophils Relative 57  43 - 77 (%)    Neutro Abs 3.2  1.7 - 7.7 (K/uL)    Lymphocytes Relative 28  12 - 46 (%)    Lymphs  Abs 1.6  0.7 - 4.0 (K/uL)    Monocytes Relative 7  3 - 12 (%)    Monocytes Absolute 0.4  0.1 - 1.0 (K/uL)    Eosinophils Relative 7 (*) 0 - 5 (%)    Eosinophils Absolute 0.4  0.0 - 0.7 (K/uL)    Basophils Relative 1  0 - 1 (%)    Basophils Absolute 0.1  0.0 - 0.1 (K/uL)   COMPREHENSIVE METABOLIC PANEL     Status: Abnormal   Collection Time   08/04/11  7:19 PM      Component Value Range Comment   Sodium 140  135 - 145 (mEq/L)    Potassium 2.9 (*) 3.5 - 5.1 (mEq/L)    Chloride 104  96 - 112 (mEq/L)    CO2 24  19 - 32 (mEq/L)    Glucose, Bld 85  70 - 99 (mg/dL)    BUN 3 (*) 6 - 23 (mg/dL)    Creatinine, Ser 1.61 (*) 0.50 - 1.10 (mg/dL)    Calcium 9.0  8.4 - 10.5 (mg/dL)    Total Protein 6.4  6.0 - 8.3 (g/dL)    Albumin 2.2 (*) 3.5 - 5.2 (g/dL)    AST 096 (*) 0 - 37 (U/L)    ALT 99 (*) 0 - 35 (U/L)    Alkaline Phosphatase 289 (*) 39 - 117 (U/L)    Total Bilirubin 0.6  0.3 - 1.2 (mg/dL)    GFR calc non Af Amer >90  >90 (mL/min)    GFR calc Af Amer >90  >90 (mL/min)   LIPASE, BLOOD     Status: Normal   Collection Time   08/04/11  7:19 PM      Component Value Range Comment   Lipase 18  11 - 59 (U/L)    Ct Abdomen Pelvis W Contrast  08/04/2011  *RADIOLOGY REPORT*  Clinical Data: Upper abdominal pain radiating to the back with nausea.  History of pancreatitis.  Fever.  CT ABDOMEN AND PELVIS WITH CONTRAST  Technique:  Multidetector CT imaging of the abdomen and pelvis was performed following the standard protocol during bolus administration of intravenous contrast.  Contrast: OMNIPAQUE IOHEXOL 300 MG/ML  SOLN  Comparison: 07/21/2011  Findings: Atelectasis in the lung bases.  The previous right pleural effusion has resolved.  Diffuse fatty infiltration of the liver.  Small subcapsular hepatic fluid collection/abscess along the right posterior inferior liver edge today measures about 3.7 x 1.3 cm, decreased since the previous study. Right lower quadrant abscess measures 3.3 x 3.3 cm,  decreased since previous study.  Right upper quadrant drainage catheter remains in place below the liver edge.  No significant change in position.  Mild residual infiltration/edema in the peritoneal fat inferior to the liver and in the subcutaneous soft tissues along the right side of the abdomen.  Skin clips along the midline with surgical wound present.  Small loculated fluid collection consistent with small abscess or postoperative fluid collection deep to the superior aspect of the lung measuring about 1.4 cm in diameter, unchanged since previous study.  Surgical absence of the gallbladder.  Mild bile duct dilatation is likely physiologic.  The pancreatic duct is mildly dilated but there is normal homogeneous enhancement of the pancreas without significant peripancreatic infiltration.  The appearance is similar to the previous study.  Postoperative changes with apparent resection of the distal stomach and gastrojejunostomy.  No evidence of gastric obstruction.  Left upper quadrant/midline bowel loop demonstrates wall thickening.  This balloon was previously distended on the prior study.  Changes may be due to focal contraction although edema, inflammatory process, or focal ischemia are not excluded. This appears to be just proximal to the level of one of the anastomoses.  No significant free air is demonstrated in the abdomen.  Bilateral renal parenchymal cysts.  No hydronephrosis.  No solid renal mass lesions.  Normal caliber abdominal aorta.  Bowel appear to decompressed since the previous study.  Stool filled colon without distension.  No significant retroperitoneal lymphadenopathy.  Pelvis:  Bladder  wall is not thickened.  No inflammatory changes involving the sigmoid colon.  The appendix is not visualized.  No free or loculated pelvic fluid collections.  Normal alignment of the lumbar spine.  Anterior compression of T12 vertebra, stable since previous studies.  IMPRESSION: Postoperative changes in the upper  abdomen as discussed.  Right upper quadrant drainage catheter remains in place.  Described abscess cavities appear to be decreasing or stable in size.  No new abscess or inflammatory change.  There appears to be interval decompression of the bowel loops.  A focal bowel loop in the left upper quadrant towards the midline which was previously distended now demonstrates wall thickening.  Mild prominence of the renal collecting system without obvious obstructive change.  Original Report Authenticated By: Marlon Pel, M.D.   Dg Abd Acute W/chest  08/04/2011  *RADIOLOGY REPORT*  Clinical Data: Recent partial bowel resection with increased pain.  ACUTE ABDOMEN SERIES (ABDOMEN 2 VIEW & CHEST 1 VIEW)  Comparison: CT abdomen pelvis 07/21/2011 and chest radiograph 07/03/2011.  Findings: Trachea is midline.  Heart size normal.  Minimal biapical pleural thickening.  Scattered linear densities at the lung bases are noted.  Lungs are otherwise clear.  Right hemidiaphragm is elevated.  No definite pleural fluid.  Two views of the abdomen show a surgical drain in the right abdomen.  There are postoperative changes in the left abdomen. Mild gaseous distention of small bowel is seen in the central left abdomen.  Gas and stool in the colon, as well as rectum.  IMPRESSION:  Bowel gas pattern is indicative of a postoperative ileus.  Early or partial small bowel obstruction cannot be definitively excluded.  Original Report Authenticated By: Reyes Ivan, M.D.    Review of Systems  Constitutional: Negative.   HENT: Negative.   Eyes: Negative.   Respiratory: Negative.   Cardiovascular: Negative.   Gastrointestinal: Positive for nausea and abdominal pain.  Genitourinary: Negative.   Skin: Negative.   Neurological: Negative.     Blood pressure 107/69, pulse 77, temperature 98.3 F (36.8 C), temperature source Axillary, resp. rate 18, height 5\' 4"  (1.626 m), weight 60.5 kg (133 lb 6.1 oz), SpO2 96.00%. Physical  Exam  Constitutional: She is oriented to person, place, and time. She appears well-developed and well-nourished. She appears distressed.  HENT:  Head: Normocephalic and atraumatic.  Neck: Normal range of motion. Neck supple.  Cardiovascular: Normal rate, regular rhythm and normal heart sounds.   Respiratory: Effort normal and breath sounds normal.  GI: Soft. There is tenderness.       Decreased bowel sounds noted.  Musculoskeletal: Normal range of motion.  Neurological: She is alert and oriented to person, place, and time.  Skin: Skin is warm and dry.     Assessment/Plan Impression: Nausea and abdominal pain secondary to chronic pancreatitis Hypokalemia, hypoalbuminemia Plan: Will supplement potassium. We will try to control her pain and nausea. We'll give her intravenous fluid hydration to help with control of her chronic pancreatitis.  Real Cona A 08/05/2011, 9:17 AM

## 2011-08-05 NOTE — Progress Notes (Signed)
Incentive given , pt refused to do in too much pain

## 2011-08-05 NOTE — Care Management Note (Unsigned)
    Page 1 of 1   08/05/2011     4:00:52 PM   CARE MANAGEMENT NOTE 08/05/2011  Patient:  Jordan Haney, Jordan Haney   Account Number:  [id number]  Date Initiated:  08/05/2011  Documentation initiated by:  Sharrie Rothman  Subjective/Objective Assessment:   Pt admitted from home with chronic pancreatitis. Pt lives with fiance and is independent with ADL's. Pt is currently active with [facility].     Action/Plan:   CM will arrange resumption of services when pt discharged. Discussed with pt triggers of pain and what causes pt to come to ED frequently. Will continue to monitor.   Anticipated DC Date:  08/11/2011   Anticipated DC Plan:  HOME W HOME HEALTH SERVICES      DC Planning Services  CM consult      [facility] Choice  Resumption Of Svcs/PTA Provider   Choice offered to / List presented to:  C-1 Patient        HH arranged  HH-1 RN      [facility] agency  [facility]   Status of service:  In process, will continue to follow Medicare Important Message given?   (If response is "NO", the following Medicare IM given date fields will be blank) Date Medicare IM given:   Date Additional Medicare IM given:    Discharge Disposition:    Per UR Regulation:    If discussed at Long Length of Stay Meetings, dates discussed:    Comments:  08/05/11 1600 Arlyss Queen, RN BSN CM

## 2011-08-05 NOTE — Progress Notes (Signed)
UR Chart Review Completed  

## 2011-08-06 LAB — CBC
Hemoglobin: 9.7 g/dL — ABNORMAL LOW (ref 12.0–15.0)
MCH: 25.7 pg — ABNORMAL LOW (ref 26.0–34.0)
Platelets: 395 10*3/uL (ref 150–400)
RBC: 3.77 MIL/uL — ABNORMAL LOW (ref 3.87–5.11)

## 2011-08-06 LAB — BASIC METABOLIC PANEL
CO2: 24 mEq/L (ref 19–32)
Calcium: 8.5 mg/dL (ref 8.4–10.5)
GFR calc non Af Amer: >90 mL/min (ref >90)
Glucose, Bld: 93 mg/dL (ref 70–99)
Potassium: 4.6 mEq/L (ref 3.5–5.1)
Sodium: 140 mEq/L (ref 135–145)

## 2011-08-06 LAB — PHOSPHORUS: Phosphorus: 2.8 mg/dL (ref 2.3–4.6)

## 2011-08-06 LAB — MAGNESIUM: Magnesium: 1.5 mg/dL (ref 1.5–2.5)

## 2011-08-06 MED ORDER — PREGABALIN 75 MG PO CAPS
200.0000 mg | ORAL_CAPSULE | Freq: Two times a day (BID) | ORAL | Status: DC
  Administered 2011-08-06: 200 mg via ORAL
  Filled 2011-08-06 (×2): qty 1

## 2011-08-06 MED ORDER — FENTANYL 75 MCG/HR TD PT72
75.0000 ug | MEDICATED_PATCH | TRANSDERMAL | Status: DC
  Administered 2011-08-06: 75 ug via TRANSDERMAL
  Filled 2011-08-06: qty 1

## 2011-08-06 MED ORDER — SODIUM CHLORIDE 0.9 % IJ SOLN
INTRAMUSCULAR | Status: AC
  Filled 2011-08-06: qty 3

## 2011-08-06 MED ORDER — PANTOPRAZOLE SODIUM 40 MG PO TBEC
40.0000 mg | DELAYED_RELEASE_TABLET | Freq: Every day | ORAL | Status: DC
  Administered 2011-08-06: 40 mg via ORAL
  Filled 2011-08-06: qty 1

## 2011-08-06 MED ORDER — BOOST / RESOURCE BREEZE PO LIQD
1.0000 | Freq: Two times a day (BID) | ORAL | Status: DC
  Administered 2011-08-06 – 2011-08-07 (×3): 1 via ORAL
  Filled 2011-08-06 (×8): qty 1

## 2011-08-06 MED ORDER — AMLODIPINE BESYLATE 5 MG PO TABS
5.0000 mg | ORAL_TABLET | Freq: Every day | ORAL | Status: DC
  Administered 2011-08-06: 5 mg via ORAL
  Filled 2011-08-06 (×2): qty 1

## 2011-08-06 MED ORDER — GLUCERNA SHAKE PO LIQD: 237.0000 mL | Freq: Two times a day (BID) | ORAL | Status: DC

## 2011-08-06 MED ORDER — DOCUSATE SODIUM 100 MG PO CAPS
100.0000 mg | ORAL_CAPSULE | Freq: Every day | ORAL | Status: DC
  Administered 2011-08-06: 100 mg via ORAL
  Filled 2011-08-06 (×2): qty 1

## 2011-08-06 MED ORDER — PANCRELIPASE (LIP-PROT-AMYL) 12000-38000 UNITS PO CPEP
2.0000 | ORAL_CAPSULE | Freq: Three times a day (TID) | ORAL | Status: DC
  Administered 2011-08-06 – 2011-08-07 (×3): 2 via ORAL
  Filled 2011-08-06: qty 2
  Filled 2011-08-06: qty 1
  Filled 2011-08-06: qty 2

## 2011-08-06 NOTE — Progress Notes (Signed)
Subjective: Pain is somewhat improved. No nausea. No fevers or chills.  Objective: Vital signs in last 24 hours: Temp:  [98.1 F (36.7 C)-98.7 F (37.1 C)] 98.5 F (36.9 C) (05/17 1002) Pulse Rate:  [83-95] 95  (05/17 1002) Resp:  [16-20] 16  (05/17 1002) BP: (100-128)/(68-88) 100/68 mmHg (05/17 1002) SpO2:  [91 %-96 %] 94 % (05/17 1002) Last BM Date: 08/03/11  Intake/Output from previous day: 05/16 0701 - 05/17 0700 In: 3339.2 [P.O.:360; I.V.:2979.2] Out: 20 [Drains:20] Intake/Output this shift:    General appearance: alert and no distress GI: Positive bowel sounds, soft, moderate epigastric tenderness. No diffuse peritoneal signs  Lab Results:   Basename 08/06/11 0538 08/04/11 1919  WBC 4.9 5.7  HGB 9.7* 9.3*  HCT 31.3* 29.1*  PLT 395 328   BMET  Basename 08/06/11 0538 08/04/11 1919  NA 140 140  K 4.6 2.9*  CL 105 104  CO2 24 24  GLUCOSE 93 85  BUN <3* 3*  CREATININE 0.37* 0.43*  CALCIUM 8.5 9.0   PT/INR No results found for this basename: LABPROT:2,INR:2 in the last 72 hours ABG No results found for this basename: PHART:2,PCO2:2,PO2:2,HCO3:2 in the last 72 hours  Studies/Results: Ct Abdomen Pelvis W Contrast  08/04/2011  *RADIOLOGY REPORT*  Clinical Data: Upper abdominal pain radiating to the back with nausea.  History of pancreatitis.  Fever.  CT ABDOMEN AND PELVIS WITH CONTRAST  Technique:  Multidetector CT imaging of the abdomen and pelvis was performed following the standard protocol during bolus administration of intravenous contrast.  Contrast: OMNIPAQUE IOHEXOL 300 MG/ML  SOLN  Comparison: 07/21/2011  Findings: Atelectasis in the lung bases.  The previous right pleural effusion has resolved.  Diffuse fatty infiltration of the liver.  Small subcapsular hepatic fluid collection/abscess along the right posterior inferior liver edge today measures about 3.7 x 1.3 cm, decreased since the previous study. Right lower quadrant abscess measures 3.3 x 3.3  cm, decreased since previous study.  Right upper quadrant drainage catheter remains in place below the liver edge.  No significant change in position.  Mild residual infiltration/edema in the peritoneal fat inferior to the liver and in the subcutaneous soft tissues along the right side of the abdomen.  Skin clips along the midline with surgical wound present.  Small loculated fluid collection consistent with small abscess or postoperative fluid collection deep to the superior aspect of the lung measuring about 1.4 cm in diameter, unchanged since previous study.  Surgical absence of the gallbladder.  Mild bile duct dilatation is likely physiologic.  The pancreatic duct is mildly dilated but there is normal homogeneous enhancement of the pancreas without significant peripancreatic infiltration.  The appearance is similar to the previous study.  Postoperative changes with apparent resection of the distal stomach and gastrojejunostomy.  No evidence of gastric obstruction.  Left upper quadrant/midline bowel loop demonstrates wall thickening.  This balloon was previously distended on the prior study.  Changes may be due to focal contraction although edema, inflammatory process, or focal ischemia are not excluded. This appears to be just proximal to the level of one of the anastomoses.  No significant free air is demonstrated in the abdomen.  Bilateral renal parenchymal cysts.  No hydronephrosis.  No solid renal mass lesions.  Normal caliber abdominal aorta.  Bowel appear to decompressed since the previous study.  Stool filled colon without distension.  No significant retroperitoneal lymphadenopathy.  Pelvis:  Bladder wall is not thickened.  No inflammatory changes involving the sigmoid colon.  The appendix is not visualized.  No free or loculated pelvic fluid collections.  Normal alignment of the lumbar spine.  Anterior compression of T12 vertebra, stable since previous studies.  IMPRESSION: Postoperative changes in the  upper abdomen as discussed.  Right upper quadrant drainage catheter remains in place.  Described abscess cavities appear to be decreasing or stable in size.  No new abscess or inflammatory change.  There appears to be interval decompression of the bowel loops.  A focal bowel loop in the left upper quadrant towards the midline which was previously distended now demonstrates wall thickening.  Mild prominence of the renal collecting system without obvious obstructive change.  Original Report Authenticated By: Marlon Pel, M.D.   Dg Abd Acute W/chest  08/04/2011  *RADIOLOGY REPORT*  Clinical Data: Recent partial bowel resection with increased pain.  ACUTE ABDOMEN SERIES (ABDOMEN 2 VIEW & CHEST 1 VIEW)  Comparison: CT abdomen pelvis 07/21/2011 and chest radiograph 07/03/2011.  Findings: Trachea is midline.  Heart size normal.  Minimal biapical pleural thickening.  Scattered linear densities at the lung bases are noted.  Lungs are otherwise clear.  Right hemidiaphragm is elevated.  No definite pleural fluid.  Two views of the abdomen show a surgical drain in the right abdomen.  There are postoperative changes in the left abdomen. Mild gaseous distention of small bowel is seen in the central left abdomen.  Gas and stool in the colon, as well as rectum.  IMPRESSION:  Bowel gas pattern is indicative of a postoperative ileus.  Early or partial small bowel obstruction cannot be definitively excluded.  Original Report Authenticated By: Reyes Ivan, M.D.    Anti-infectives: Anti-infectives    None      Assessment/Plan: s/p * No surgery found * Epigastric pain suspect recurrence of her chronic pancreatitis. Continue pain control. Advance diet as tolerated. Hopeful discharge in the next 24 hours.  LOS: 2 days    Jordan Haney C 08/06/2011

## 2011-08-06 NOTE — Progress Notes (Signed)
The patient is receiving Protonix by the intravenous route.  Based on criteria approved by the Pharmacy and Therapeutics Committee and the Medical Executive Committee, the medication is being converted to the equivalent oral dose form.  These criteria include: -No Active GI bleeding -Able to tolerate diet of full liquids (or better) or tube feeding -Able to tolerate other medications by the oral or enteral route  If you have any questions about this conversion, please contact the Pharmacy Department (ext [phone]).  Thank you.  Mady Gemma, [facility] 08/06/2011 1:11 PM

## 2011-08-06 NOTE — Progress Notes (Signed)
Patient was feeling hungry, advanced diet to low fat, pancreatic diet as ordered.  Patient received soup for lunch and stated she was "dying in pain after three spoons."  Changed diet back to full liquids for now and will try advancing diet at a later time.  Haney, Jordan Stagers 08/06/2011

## 2011-08-06 NOTE — Discharge Instructions (Signed)
[facility] for registered nurse.

## 2011-08-06 NOTE — Progress Notes (Addendum)
INITIAL ADULT NUTRITION ASSESSMENT Date: 08/06/2011   Time: 9:47 AM Reason for Assessment: Nutrition Risk  Screen-wt loss  ASSESSMENT: Female 56 y.o.  C/o abdominal pain and nausea  Past Medical History  Diagnosis Date  . RA (rheumatoid arthritis)  2008  . MI (myocardial infarction) 2008    Not well documented, patient reports reassuring cardiac catheterization and stress testing in [place]  . Constipation 03/21/2011  . Hypokalemia 03/21/2011  . Fatty liver 03/21/2011  . Pancreatitis     states 3 years ago, very severe, ?biliary pancreatitis   . T12 compression fracture 2011  . Duodenal ulcer     remote per patient. +BC powders, patient report negative EGD six months ago  . Dilated pancreatic duct     ?chronic pancreatitis, EUS pending (06/02/11)    Scheduled Meds:   . enoxaparin  40 mg Subcutaneous Q24H  . fentaNYL  75 mcg Transdermal Q72H  .  HYDROmorphone (DILAUDID) injection  0.5 mg Intravenous Once  . LORazepam  1 mg Intravenous Q4H  . pantoprazole (PROTONIX) IV  40 mg Intravenous QHS   Continuous Infusions:   . sodium chloride    . dextrose 5 % and 0.45 % NaCl with KCl 40 mEq/L 125 mL/hr at 08/06/11 0144   PRN Meds:.diphenhydrAMINE, diphenhydrAMINE, HYDROmorphone (DILAUDID) injection, ondansetron  Ht: 5\' 4"  (162.6 cm)  Wt: 133 lb 6.1 oz (60.5 kg)  Ideal Wt: 54.7 kg  % Ideal Wt:111%  Usual Wt: 135#  61kg (07/26/11)-During previous admission pt reported UBW-142# % Usual Wt: 98%  Body mass index is 22.89 kg/(m^2). Normal  Food/Nutrition Related Hx: Pt  known to RD from previous admissions. Pt s/p antrectomy and gastrojejunostomy 06/28/11. Re-admitted due to abdominal pain and re-assessed on 07/26/11. Hx of wt loss reported wt has been variable from 135-154# past 2 admissions. However wt change of  only1-2# since last admission which is not significant. She feeds herself and is tolerating full liquids. Will add Resource Breeze to optimize nutrition intake while diet  is being advanced. Pt doesn't/t like "milky" supplements.   CMP     Component Value Date/Time   NA 140 08/06/2011 0538   K 4.6 08/06/2011 0538   CL 105 08/06/2011 0538   CO2 24 08/06/2011 0538   GLUCOSE 93 08/06/2011 0538   BUN <3* 08/06/2011 0538   CREATININE 0.37* 08/06/2011 0538   CALCIUM 8.5 08/06/2011 0538   PROT 6.4 08/04/2011 1919   ALBUMIN 2.2* 08/04/2011 1919   AST 265* 08/04/2011 1919   ALT 99* 08/04/2011 1919   ALKPHOS 289* 08/04/2011 1919   BILITOT 0.6 08/04/2011 1919   GFRNONAA >90 08/06/2011 0538   GFRAA >90 08/06/2011 0538    Intake/Output Summary (Last 24 hours) at 08/06/11 0956 Last data filed at 08/06/11 0550  Gross per 24 hour  Intake 3139.17 ml  Output     20 ml  Net 3119.17 ml    Diet Order: Full Liquid  Supplements/Tube Feeding:none at this time  IVF:    sodium chloride   dextrose 5 % and 0.45 % NaCl with KCl 40 mEq/L Last Rate: 125 mL/hr at 08/06/11 0144    Estimated Nutritional Needs:   Kcal:1708-1830 kcal per day Protein:79-92 grams per day Fluid:1 ml/kcal  NUTRITION DIAGNOSIS:  -Altered GI function (NI-1.4). Status: Ongoing   RELATED TO: recent antrectomy and gastrojejunostomy 06/28/11,  chronic pancreatitis   AS EVIDENCE BY: pt hx   MONITORING/EVALUATION(Goals):  -Monitor diet advancement, po intake of meals and oral supplement, labs  -Pt  will tol low fat diet and Resource Breeze to meet minimum est nutritional needs  EDUCATION NEEDS: -Education needs addressed  INTERVENTION: -Add Resource Breeze  BID between meals  Dietitian [phone]  DOCUMENTATION CODES Per approved criteria  -Not Applicable    Francene Boyers 08/06/2011, 9:47 AM

## 2011-08-07 MED ORDER — HYDROMORPHONE HCL 2 MG PO TABS: 2.0000 mg | ORAL_TABLET | ORAL | Status: DC

## 2011-08-07 NOTE — Progress Notes (Signed)
D/c instructions reviewed with patient.  Verbalized understanding.  Pt to be d/c to home with fiance when he arrives.  Schonewitz, Candelaria Stagers 08/07/2011

## 2011-08-07 NOTE — Discharge Summary (Signed)
Physician Discharge Summary  Patient ID: Jordan Haney MRN: [id number] DOB/AGE: [birth year] 55 y.o.  Admit date: 08/04/2011 Discharge date: 08/07/2011  Admission Diagnoses: Acute exacerbation of chronic pancreatitis, nausea, hypokalemia, hypoalbuminemia  Discharge Diagnoses: Same Active Problems:  * No active hospital problems. *    Discharged Condition: fair  Hospital Course: Patient is a 56 year old white female well known to our service, status post partial gastrectomy and repair of common bile duct injury who presented to the emergency room with worsening nausea, vomiting, and epigastric pain. She is a known history of chronic pancreatitis. She was admitted for intravenous hydration, control of her pain and nausea, and correction of her hypokalemia. Her potassium has normalized and her nausea and vomiting have resolved. She is being discharged home in fair and stable condition. She will be followed by home health.  Discharge Exam: Blood pressure 104/74, pulse 107, temperature 97.1 F (36.2 C), temperature source Axillary, resp. rate 18, height 5\' 4"  (1.626 m), weight 60.5 kg (133 lb 6.1 oz), SpO2 94.00%. General appearance: cooperative and appears older than stated age Resp: clear to auscultation bilaterally Cardio: regular rate and rhythm, S1, S2 normal, no murmur, click, rub or gallop GI: Soft and flat. No rigidity noted. Minimal serous drainage from Jackson-Pratt drain. Incisions all healing well.  Disposition: 01-Home or Self Care   Medication List  As of 08/07/2011 11:10 AM   TAKE these medications         amLODipine 5 MG tablet   Commonly known as: NORVASC   Take 1 tablet (5 mg total) by mouth daily.      aspirin 325 MG tablet   Take 1 tablet (325 mg total) by mouth daily.      celecoxib 200 MG capsule   Commonly known as: CELEBREX   Take 1 capsule (200 mg total) by mouth 2 (two) times daily.      diazepam 5 MG tablet   Commonly known as: VALIUM   Take 2.5-5 mg  by mouth every 8 (eight) hours as needed.      docusate sodium 100 MG capsule   Commonly known as: COLACE   Take 100 mg by mouth daily.      HYDROmorphone 2 MG tablet   Commonly known as: DILAUDID   Take 1 tablet (2 mg total) by mouth every 4 (four) hours.      lipase/protease/amylase 40981 UNITS Cpep   Commonly known as: CREON-10/PANCREASE   Take 2 capsules by mouth 3 (three) times daily with meals.      pantoprazole 40 MG tablet   Commonly known as: PROTONIX   Take 1 tablet (40 mg total) by mouth daily at 12 noon.      pregabalin 200 MG capsule   Commonly known as: LYRICA   Take 200 mg by mouth daily.           Follow-up Information    Follow up with [facility]- [phone].      Follow up with Fabio Bering, MD. Schedule an appointment as soon as possible for a visit on 08/12/2011.   Contact information:   [address] [place] [address] [phone]          Signed: Dalia Heading 08/07/2011, 11:10 AM

## 2011-08-24 ENCOUNTER — Encounter (HOSPITAL_COMMUNITY): Payer: Self-pay | Admitting: *Deleted

## 2011-08-24 ENCOUNTER — Emergency Department (HOSPITAL_COMMUNITY)

## 2011-08-24 ENCOUNTER — Emergency Department (HOSPITAL_COMMUNITY)
Admission: EM | Admit: 2011-08-24 | Discharge: 2011-08-24 | Disposition: A | Discharge status: Home / Self Care | Attending: Emergency Medicine | Admitting: Emergency Medicine

## 2011-08-24 DIAGNOSIS — R10816 Epigastric abdominal tenderness: Secondary | ICD-10-CM | POA: Insufficient documentation

## 2011-08-24 DIAGNOSIS — R109 Unspecified abdominal pain: Secondary | ICD-10-CM

## 2011-08-24 DIAGNOSIS — Z9089 Acquired absence of other organs: Secondary | ICD-10-CM | POA: Insufficient documentation

## 2011-08-24 DIAGNOSIS — Z934 Other artificial openings of gastrointestinal tract status: Secondary | ICD-10-CM | POA: Insufficient documentation

## 2011-08-24 DIAGNOSIS — K861 Other chronic pancreatitis: Secondary | ICD-10-CM | POA: Insufficient documentation

## 2011-08-24 DIAGNOSIS — Z8711 Personal history of peptic ulcer disease: Secondary | ICD-10-CM | POA: Insufficient documentation

## 2011-08-24 DIAGNOSIS — K7689 Other specified diseases of liver: Secondary | ICD-10-CM | POA: Insufficient documentation

## 2011-08-24 LAB — URINALYSIS, ROUTINE W REFLEX MICROSCOPIC
Hgb urine dipstick: NEGATIVE
Nitrite: NEGATIVE
Protein, ur: NEGATIVE mg/dL
Urobilinogen, UA: 0.2 mg/dL (ref 0.0–1.0)

## 2011-08-24 LAB — CBC
Hemoglobin: 11.4 g/dL — ABNORMAL LOW (ref 12.0–15.0)
MCH: 25.6 pg — ABNORMAL LOW (ref 26.0–34.0)
MCHC: 31.4 g/dL (ref 30.0–36.0)
MCV: 81.4 fL (ref 78.0–100.0)
RBC: 4.46 MIL/uL (ref 3.87–5.11)

## 2011-08-24 LAB — COMPREHENSIVE METABOLIC PANEL
Albumin: 2.4 g/dL — ABNORMAL LOW (ref 3.5–5.2)
Alkaline Phosphatase: 222 U/L — ABNORMAL HIGH (ref 39–117)
BUN: 4 mg/dL — ABNORMAL LOW (ref 6–23)
Calcium: 9 mg/dL (ref 8.4–10.5)
Creatinine, Ser: 0.51 mg/dL (ref 0.50–1.10)
GFR calc Af Amer: >90 mL/min (ref >90)
Glucose, Bld: 92 mg/dL (ref 70–99)
Total Protein: 6.9 g/dL (ref 6.0–8.3)

## 2011-08-24 LAB — LIPASE, BLOOD: Lipase: 22 U/L (ref 11–59)

## 2011-08-24 LAB — DIFFERENTIAL
Basophils Relative: 2 % — ABNORMAL HIGH (ref 0–1)
Eosinophils Absolute: 0.2 10*3/uL (ref 0.0–0.7)
Eosinophils Relative: 5 % (ref 0–5)
Lymphs Abs: 2 10*3/uL (ref 0.7–4.0)
Monocytes Relative: 10 % (ref 3–12)

## 2011-08-24 LAB — LACTIC ACID, PLASMA: Lactic Acid, Venous: 2.7 mmol/L — ABNORMAL HIGH (ref 0.5–2.2)

## 2011-08-24 MED ORDER — ONDANSETRON HCL 4 MG/2ML IJ SOLN
4.0000 mg | Freq: Once | INTRAMUSCULAR | Status: AC
  Administered 2011-08-24: 4 mg via INTRAVENOUS
  Filled 2011-08-24: qty 2

## 2011-08-24 MED ORDER — SODIUM CHLORIDE 0.9 % IV BOLUS (SEPSIS)
1000.0000 mL | Freq: Once | INTRAVENOUS | Status: AC
  Administered 2011-08-24: 1000 mL via INTRAVENOUS

## 2011-08-24 MED ORDER — HYDROMORPHONE HCL PF 2 MG/ML IJ SOLN
2.0000 mg | Freq: Once | INTRAMUSCULAR | Status: AC
  Administered 2011-08-24: 2 mg via INTRAVENOUS
  Filled 2011-08-24: qty 1

## 2011-08-24 MED ORDER — ONDANSETRON HCL 4 MG PO TABS: 4.0000 mg | ORAL_TABLET | Freq: Four times a day (QID) | ORAL | Status: DC

## 2011-08-24 MED ORDER — POTASSIUM CHLORIDE 10 MEQ/100ML IV SOLN
10.0000 meq | INTRAVENOUS | Status: AC
  Administered 2011-08-24 (×2): 10 meq via INTRAVENOUS
  Filled 2011-08-24: qty 200

## 2011-08-24 MED ORDER — IOHEXOL 300 MG/ML  SOLN
100.0000 mL | Freq: Once | INTRAMUSCULAR | Status: AC | PRN
  Administered 2011-08-24: 100 mL via INTRAVENOUS

## 2011-08-24 MED ORDER — IOHEXOL 300 MG/ML  SOLN
40.0000 mL | Freq: Once | INTRAMUSCULAR | Status: AC | PRN
  Administered 2011-08-24: 40 mL via ORAL

## 2011-08-24 NOTE — ED Notes (Signed)
Pt c/o abdominal pain and nausea since Friday but has gotten worse today.

## 2011-08-24 NOTE — ED Provider Notes (Signed)
History  This chart was scribed for Glynn Octave, MD by Bennett Scrape. This patient was seen in room [facility]12[facility]12 and the patient's care was started at 5:08PM.  CSN: [id number]  Arrival date & time 08/24/11  1650   First MD Initiated Contact with Patient 08/24/11 1708      Chief Complaint  Patient presents with  . Abdominal Pain    The history is provided by the patient. No language interpreter was used.     Jordan Haney is a 56 y.o. female who presents to the Emergency Department complaining of 4 days of gradual onset, gradually worsening, constant epigastric abdominal pain with associated nausea that she attributes to pancreatitis. She reports that the abdominal pain radiates to the right flank occasioanlly. She reports that she has been taking 2 mg Dilaudids with no improvement in her symptoms. She reports prior episodes of similar but more severe symptoms diagnosed as pancreatitis. She states that she normally gets diarrhea and emesis with the pancreatitis episodes. Pt's husband states that pt has been experiencing more frequent pancreatitis flare-ups for the past 3 weeks since her abdominal surgery 8 weeks ago.  Pt reports that she had gastrojejunostomy surgery on a bleeding ulcer in April 2013. She states that she has been recovering well and has a follow up appoint with Dr. Leticia Penna to suture up the open wound in 2 weeks. Pt reports that she called Dr. Illene Regulus office and was told to come here for evaluation. She denies emesis, diarrhea, fevers, hematuria and hematochezia as associated symptoms. Pt has a h/o RA and is taking humira. She also has a h/o MI and hypokalemia. She denies smoking and alcohol use.  PCP is Henreitta Leber  GI is Dr. Leticia Penna  Past Medical History  Diagnosis Date  . RA (rheumatoid arthritis)  2008  . MI (myocardial infarction) 2008    Not well documented, patient reports reassuring cardiac catheterization and stress testing in [place]  . Constipation  03/21/2011  . Hypokalemia 03/21/2011  . Fatty liver 03/21/2011  . Pancreatitis     states 3 years ago, very severe, ?biliary pancreatitis   . T12 compression fracture 2011  . Duodenal ulcer     remote per patient. +BC powders, patient report negative EGD six months ago  . Dilated pancreatic duct     ?chronic pancreatitis, EUS pending (06/02/11)    Past Surgical History  Procedure Date  . Cholecystectomy   . Knee surgery   . Appendectomy   . Complete hysterectomy   . Ventral wall hernia   . Esophagogastroduodenoscopy 08/2010    Dr. Samuella Cota, [place]. small hh, mild prepylori gastritis. path unavailable  . Esophagogastroduodenoscopy 04/2010    Dr. Samuella Cota, [place]. small hh, mild distal esophagitis, antral gastritis and duodenitis. Bx mild chronic gastritis and no H.pylori  . Esophagogastroduodenoscopy 08/2009    Dr. Allena Katz, [place]. Moderate gastritis, moderate duodenitis with nodularity in proximal duodenal bulb, bx chronic gastritis, no helicobacter, mild chronic duodenitis  . Esophagogastroduodenoscopy 02/2007    Dr. Samuella Cota, [place]. antral gastritis and duodenal ulcers. Bx mild chronic gastritis. No bx from duodenal ulcer available.  Gaspar Bidding dilation 04/06/2011    Procedure: SAVORY DILATION;  Surgeon: Arlyce Harman, MD;  Location: [facility];  Service: Endoscopy;  Laterality: N/A;  16 mm  . Esophagogastroduodenoscopy 04/06/11    Dr. Darrick Penna: 1-2 cm hiatal hernia, mod gastritis, 60mmX3mm linear ulcer in duodenal bulb, stricture 1st part of duodenum, dilated to 12 mm  . Esophagogastroduodenoscopy 05/12/11    partial gastric  oulet obstruction secondary to stricture between bulb/2nd portion of duodenum with marked friability and inflammation but no discrete ulcer, dilated stomach. s/p dilation but high risk for restenosis  . Esophagogastroduodenoscopy 06/22/2011    Procedure: ESOPHAGOGASTRODUODENOSCOPY (EGD);  Surgeon: West Bali, MD;  Location: [facility];  Service: Endoscopy;  Laterality: N/A;    . Gastrojejunostomy 06/28/2011    Procedure: GASTROJEJUNOSTOMY;  Surgeon: Fabio Bering, MD;  Location: [facility];  Service: General;  Laterality: N/A;  Roux-en-y, Choledocalduodenostomy  . Abdominal hysterectomy     Family History  Problem Relation Age of Onset  . Colon cancer Neg Hx   . Liver cancer Neg Hx   . Breast cancer Neg Hx   . Inflammatory bowel disease Neg Hx   . Heart attack Mother     History  Substance Use Topics  . Smoking status: Never Smoker   . Smokeless tobacco: Not on file  . Alcohol Use: No    Review of Systems  A complete 10 system review of systems was obtained and all systems are negative except as noted in the HPI and PMH.   Allergies  Compazine; Promethazine hcl; Vistaril; Gabapentin; and Latex  Home Medications   Current Outpatient Rx  Name Route Sig Dispense Refill  . AMLODIPINE BESYLATE 5 MG PO TABS Oral Take 1 tablet (5 mg total) by mouth daily. 30 tablet 0  . ASPIRIN 325 MG PO TABS Oral Take 1 tablet (325 mg total) by mouth daily. 30 tablet 2  . DOCUSATE SODIUM 100 MG PO CAPS Oral Take 100 mg by mouth daily.    Marland Kitchen ESOMEPRAZOLE MAGNESIUM 40 MG PO CPDR Oral Take 40 mg by mouth daily before breakfast.    . HYDROMORPHONE HCL 2 MG PO TABS Oral Take 2 mg by mouth every 8 (eight) hours.    Marland Kitchen PANCRELIPASE (LIP-PROT-AMYL) 12000 UNITS PO CPEP Oral Take 2 capsules by mouth 3 (three) times daily with meals. 270 capsule 2  . PREGABALIN 200 MG PO CAPS Oral Take 200 mg by mouth 2 (two) times daily.      Triage Vitals: BP 114/90  Pulse 97  Temp(Src) 98.4 F (36.9 C) (Oral)  Resp 18  Ht 5\' 4"  (1.626 m)  Wt 128 lb (58.06 kg)  BMI 21.97 kg/m2  SpO2 99%  Physical Exam  Nursing note and vitals reviewed. Constitutional: She is oriented to person, place, and time. She appears well-developed and well-nourished. No distress.       Uncomfortable appearing  HENT:  Head: Normocephalic and atraumatic.  Mouth/Throat: Oropharynx is clear and moist.  Eyes:  Conjunctivae and EOM are normal. Pupils are equal, round, and reactive to light.  Neck: Neck supple. No tracheal deviation present.  Cardiovascular: Normal rate and regular rhythm.   Pulmonary/Chest: Effort normal and breath sounds normal. No respiratory distress.  Abdominal: Soft. Bowel sounds are normal. There is tenderness (epigastric ). There is guarding.       Healing midline incision with 0.5 cm area of dehiscence    draining clear fluid, no CVA tenderness  Musculoskeletal: Normal range of motion. She exhibits no tenderness.  Neurological: She is alert and oriented to person, place, and time.  Skin: Skin is warm and dry.  Psychiatric: She has a normal mood and affect. Her behavior is normal.    ED Course  Procedures (including critical care time)  DIAGNOSTIC STUDIES: Oxygen Saturation is 99% on room air, normal by my interpretation.    COORDINATION OF CARE: 5:15PM-Discussed treatment plan  which includes pain medications and blood work with pt and pt agreed to plan. 7:03PM-Pt rechecked and states that she is still having abdominal pain. I will order some more pain medications. Informed pt of blood work results and stated that I am still waiting on radiology.  8:00PM-Pt rechecked and is feeling better. Informed pt of radiology results. Discussed my plan to consult with Dr. Leticia Penna to determine appropriate plan of action.  Labs Reviewed  CBC - Abnormal; Notable for the following:    Hemoglobin 11.4 (*)    MCH 25.6 (*)    RDW 17.8 (*)    All other components within normal limits  DIFFERENTIAL - Abnormal; Notable for the following:    Basophils Relative 2 (*)    All other components within normal limits  COMPREHENSIVE METABOLIC PANEL - Abnormal; Notable for the following:    Potassium 3.3 (*)    BUN 4 (*)    Albumin 2.4 (*)    AST 61 (*)    Alkaline Phosphatase 222 (*)    All other components within normal limits  LACTIC ACID, PLASMA - Abnormal; Notable for the following:     Lactic Acid, Venous 2.7 (*)    All other components within normal limits  LIPASE, BLOOD  URINALYSIS, ROUTINE W REFLEX MICROSCOPIC   Ct Abdomen Pelvis W Contrast  08/24/2011  *RADIOLOGY REPORT*  Clinical Data: Abdominal pain.  Right-sided flank pain and right upper quadrant pain.  CT ABDOMEN AND PELVIS WITH CONTRAST  Technique:  Multidetector CT imaging of the abdomen and pelvis was performed following the standard protocol during bolus administration of intravenous contrast.  Contrast: OMNIPAQUE IOHEXOL 300 MG/ML  SOLN, 40mL OMNIPAQUE IOHEXOL 300 MG/ML  SOLN  Comparison: CT of abdomen and pelvis 08/04/2011.  Findings:  Lung Bases: Linear opacity in the right lower lobe is unchanged, compatible with an area of scarring.  The visualized portions of the thorax are otherwise unremarkable.  Abdomen/Pelvis:  There is profound low attenuation throughout the hepatic parenchyma, consistent with severe hepatic steatosis.  The patient is status post cholecystectomy.  There is dilatation of the common bile duct (1.6 cm in the porta hepatis), and mild intrahepatic biliary ductal dilatation (similar to prior study). There are multiple surgical clips immediately lateral to the head of the pancreas adjacent to the distal common bile duct, likely from recent repair of common bile duct injury. The distal common bile duct abruptly tapers immediately before the ampulla (no definite radiopaque stone is identified within the distal duct). The pancreatic duct appears mildly dilated (5 mm). However, there are no peripancreatic inflammatory changes to suggest an acute pancreatitis at this time.  The enhanced appearance of the spleen, bilateral adrenal glands and right kidney is unremarkable.  There is again a 1 cm low attenuation lesion in the anterior aspect of the upper pole left kidney, compatible with a small cyst (unchanged). Postoperative changes of gastrojejunostomy are again noted.  Again noted is a 1.3 x 1.0 cm  rim-enhancing low-attenuation collection immediately adjacent to the falciform ligament of the liver, immediately deep to the overlying surgical wound.  This is unchanged in size and appearance.  Previously noted subcapsular fluid collection overlying the right lobe of the liver has significantly decreased in size, currently measuring only 2.7 x 1.0 cm (previously 3.7 x 1.3 cm).  There is a moderate volume of low attenuation ascites, seen around the inferior aspect of both the liver and spleen, in the pericolic gutters bilaterally, and the largest collection is  in the cul-de-sac.    In the right side of the abdomen immediately anterior to the right psoas muscle there is again a rim-enhancing 3.2 x 2.5 cm intermediate attenuation ( 18 HU) collection, which appears unchanged.    There are multiple borderline dilated loops of proximal small bowel (up to 2.9 cm in diameter). Adjacent 12 of the surgical anastomoses in the central abdomen inferiorly there is a 3.8 cm dilated loop of small bowel, likely to represent a denervation of the hip.  No gross pneumoperitoneum.  The patient is status post total abdominal hysterectomy and bilateral salpingo-oophorectomy.  Musculoskeletal: There are no aggressive appearing lytic or blastic lesions noted in the visualized portions of the skeleton.  A compression fracture at T12 with approximately 50% loss of height anteriorly and 20% loss of height posteriorly is unchanged compared to the prior examination.  There continues to be some mild retropulsion of the fracture fragments which slightly narrows the central spinal canal at the T11-T12 interspace (unchanged).  IMPRESSION: 1.  Compared to the prior examination there has been no wall accumulation of a moderate volume of ascites. 2.  Previously noted subcapsular fluid collection over the right lobe of the liver has slightly decreased in size.  Small rim enhancing fluid collection immediately anterior to the falciform ligament and  deep to the surgical wound is unchanged and may represent a resolving postoperative seroma. Likewise, there is a 2.5 x 3.2 cm rim enhancing fluid collection immediately anterior to the right psoas muscle which is similar in size to the prior study, and favored to represent a resolving postoperative seroma (if this were an abscess, this would be expected to have shown interval growth). 3.  Persistent intra and extrahepatic biliary ductal dilatation which abruptly terminates immediately above the level of the ampulla.  No definite ductal stone is identified on today's examination.  However, there is also mild dilatation (5 mm) of the pancreatic duct. This may suggest the extrinsic compression of the distal common bile duct and pancreatic duct released related to resolving postoperative edema, however, findings are nonspecific. 4.  Severe hepatic steatosis again noted. 5.  Additional postoperative changes and incidental findings, as above.  Original Report Authenticated By: Florencia Reasons, M.D.     No diagnosis found.    MDM  History of chronic pancreatitis as well as partial gastrectomy for bleeding ulcer 2 months ago presenting with epigastric pain and nausea for the past 4 days. Similar to previous episodes of pancreatitis.  No vomiting in the ED. Epigastric tenderness without peritonitis. LFTs and lipase similar to previous. No fever or leukocytosis.  CT results discussed with Dr. Leticia Penna. Fluid collections having previously noted and are unchanged in size. They're likely seromas and not abscesses. Persistent biliary duct dilations.  Patient is feeling better and has had no nausea or vomiting in the ED. She is to followup with Dr. Leticia Penna as scheduled. Return precautions discussed.     I personally performed the services described in this documentation, which was scribed in my presence.  The recorded information has been reviewed and considered.    Glynn Octave, MD 08/24/11 2107

## 2011-08-24 NOTE — ED Notes (Signed)
Pt is aware of a urine sample, pt states she is unable to void at this time.  

## 2011-08-24 NOTE — Discharge Instructions (Signed)
Abdominal Pain followup with Dr. Leticia Penna as scheduled. Take your medicines as prescribed. Return to the ED for new or worsening symptoms. Abdominal pain can be caused by many things. Your caregiver decides the seriousness of your pain by an examination and possibly blood tests and X-rays. Many cases can be observed and treated at home. Most abdominal pain is not caused by a disease and will probably improve without treatment. However, in many cases, more time must pass before a clear cause of the pain can be found. Before that point, it may not be known if you need more testing, or if hospitalization or surgery is needed. HOME CARE INSTRUCTIONS   Do not take laxatives unless directed by your caregiver.   Take pain medicine only as directed by your caregiver.   Only take over-the-counter or prescription medicines for pain, discomfort, or fever as directed by your caregiver.   Try a clear liquid diet (broth, tea, or water) for as long as directed by your caregiver. Slowly move to a bland diet as tolerated.  SEEK IMMEDIATE MEDICAL CARE IF:   The pain does not go away.   You have a fever.   You keep throwing up (vomiting).   The pain is felt only in portions of the abdomen. Pain in the right side could possibly be appendicitis. In an adult, pain in the left lower portion of the abdomen could be colitis or diverticulitis.   You pass bloody or black tarry stools.  MAKE SURE YOU:   Understand these instructions.   Will watch your condition.   Will get help right away if you are not doing well or get worse.  Document Released: 12/16/2004 Document Revised: 02/25/2011 Document Reviewed: 10/25/2007 [facility] Patient Information 2012 [place], [place].

## 2011-08-27 ENCOUNTER — Encounter (HOSPITAL_COMMUNITY): Payer: Self-pay | Admitting: Pharmacy Technician

## 2011-08-28 ENCOUNTER — Encounter (HOSPITAL_COMMUNITY): Payer: Self-pay | Admitting: Emergency Medicine

## 2011-08-28 ENCOUNTER — Inpatient Hospital Stay (HOSPITAL_COMMUNITY)
Admission: EM | Admit: 2011-08-28 | Discharge: 2011-09-01 | DRG: 674 | Disposition: A | Discharge status: Home / Self Care | Attending: General Surgery | Admitting: General Surgery

## 2011-08-28 ENCOUNTER — Emergency Department (HOSPITAL_COMMUNITY)

## 2011-08-28 DIAGNOSIS — I252 Old myocardial infarction: Secondary | ICD-10-CM

## 2011-08-28 DIAGNOSIS — N390 Urinary tract infection, site not specified: Principal | ICD-10-CM | POA: Diagnosis present

## 2011-08-28 DIAGNOSIS — R109 Unspecified abdominal pain: Secondary | ICD-10-CM

## 2011-08-28 DIAGNOSIS — Z7982 Long term (current) use of aspirin: Secondary | ICD-10-CM

## 2011-08-28 DIAGNOSIS — IMO0002 Reserved for concepts with insufficient information to code with codable children: Secondary | ICD-10-CM | POA: Diagnosis present

## 2011-08-28 DIAGNOSIS — E876 Hypokalemia: Secondary | ICD-10-CM | POA: Diagnosis present

## 2011-08-28 DIAGNOSIS — K861 Other chronic pancreatitis: Secondary | ICD-10-CM | POA: Diagnosis present

## 2011-08-28 DIAGNOSIS — K56609 Unspecified intestinal obstruction, unspecified as to partial versus complete obstruction: Secondary | ICD-10-CM

## 2011-08-28 DIAGNOSIS — F411 Generalized anxiety disorder: Secondary | ICD-10-CM | POA: Diagnosis present

## 2011-08-28 DIAGNOSIS — Z79899 Other long term (current) drug therapy: Secondary | ICD-10-CM

## 2011-08-28 DIAGNOSIS — M069 Rheumatoid arthritis, unspecified: Secondary | ICD-10-CM | POA: Diagnosis present

## 2011-08-28 DIAGNOSIS — Y839 Surgical procedure, unspecified as the cause of abnormal reaction of the patient, or of later complication, without mention of misadventure at the time of the procedure: Secondary | ICD-10-CM | POA: Diagnosis present

## 2011-08-28 LAB — COMPREHENSIVE METABOLIC PANEL
ALT: 43 U/L — ABNORMAL HIGH (ref 0–35)
AST: 164 U/L — ABNORMAL HIGH (ref 0–37)
CO2: 27 mEq/L (ref 19–32)
Calcium: 9 mg/dL (ref 8.4–10.5)
Sodium: 133 mEq/L — ABNORMAL LOW (ref 135–145)
Total Protein: 6.6 g/dL (ref 6.0–8.3)

## 2011-08-28 LAB — CBC
MCV: 81 fL (ref 78.0–100.0)
Platelets: 285 10*3/uL (ref 150–400)
RDW: 18.3 % — ABNORMAL HIGH (ref 11.5–15.5)
WBC: 4 10*3/uL (ref 4.0–10.5)

## 2011-08-28 LAB — DIFFERENTIAL
Basophils Absolute: 0.1 10*3/uL (ref 0.0–0.1)
Eosinophils Absolute: 0.3 10*3/uL (ref 0.0–0.7)
Eosinophils Relative: 8 % — ABNORMAL HIGH (ref 0–5)
Lymphocytes Relative: 44 % (ref 12–46)
Neutrophils Relative %: 38 % — ABNORMAL LOW (ref 43–77)

## 2011-08-28 MED ORDER — HYDROMORPHONE HCL PF 1 MG/ML IJ SOLN: 1.0000 mg | Freq: Once | INTRAMUSCULAR | Status: DC

## 2011-08-28 MED ORDER — SODIUM CHLORIDE 0.9 % IV BOLUS (SEPSIS): 250.0000 mL | Freq: Once | INTRAVENOUS | Status: DC

## 2011-08-28 MED ORDER — SODIUM CHLORIDE 0.9 % IV SOLN
INTRAVENOUS | Status: DC
  Administered 2011-08-29: 100 mL/h via INTRAVENOUS
  Administered 2011-08-29 – 2011-09-01 (×7): via INTRAVENOUS

## 2011-08-28 MED ORDER — HYDROMORPHONE HCL PF 2 MG/ML IJ SOLN
2.0000 mg | Freq: Once | INTRAMUSCULAR | Status: AC
  Administered 2011-08-28: 2 mg via INTRAMUSCULAR
  Filled 2011-08-28: qty 1

## 2011-08-28 MED ORDER — ONDANSETRON HCL 4 MG/2ML IJ SOLN: 4.0000 mg | Freq: Once | INTRAMUSCULAR | Status: DC

## 2011-08-28 MED ORDER — ONDANSETRON 8 MG PO TBDP
8.0000 mg | ORAL_TABLET | Freq: Once | ORAL | Status: AC
  Administered 2011-08-28: 8 mg via ORAL
  Filled 2011-08-28: qty 1

## 2011-08-28 MED ORDER — IOHEXOL 300 MG/ML  SOLN: 100.0000 mL | Freq: Once | INTRAMUSCULAR | Status: AC | PRN

## 2011-08-28 NOTE — ED Provider Notes (Addendum)
History   This chart was scribed for Jordan Jakes, MD scribed by Magnus Sinning. The patient was seen in room APA10/APA10 seen at  21:24   CSN: [id number]  Arrival date & time 08/28/11  2104   First MD Initiated Contact with Patient 08/28/11 2117      Chief Complaint  Patient presents with  . Abdominal Pain    (Consider location/radiation/quality/duration/timing/severity/associated sxs/prior treatment) HPI Jordan Haney is a 56 y.o. female who presents to the Emergency Department complaining of gradually worsening severe moderate right flank pain that radiates into her lower back, onset 2 days with associated severe nausea, and dysuria. She says that she had a surgery here in April  to remove part of her stomach and intestine. States she also has chronic abd pain, which she was seen here for 4 days ago. Patient states history of abd pain had been attributed to pancreatitis and fluid in abd, but she explains that today's pain is different. Says normally with abd pain she has associated vomiting,which she denies she has experienced with recent flank pain. Patient rates pain a 10/10 currently. Denies vomiting,diarrhea, fevers, neck of back pain,hematuria, hematochezia, rash, ST, congestion, HA, or lower extremity swelling.  PCP: Dr.Bridges  Surgeon: Dr. Leticia Penna  Past Medical History  Diagnosis Date  . RA (rheumatoid arthritis)  2008  . MI (myocardial infarction) 2008    Not well documented, patient reports reassuring cardiac catheterization and stress testing in [place]  . Constipation 03/21/2011  . Hypokalemia 03/21/2011  . Fatty liver 03/21/2011  . Pancreatitis     states 3 years ago, very severe, ?biliary pancreatitis   . T12 compression fracture 2011  . Duodenal ulcer     remote per patient. +BC powders, patient report negative EGD six months ago  . Dilated pancreatic duct     ?chronic pancreatitis, EUS pending (06/02/11)    Past Surgical History  Procedure Date  .  Cholecystectomy   . Knee surgery   . Appendectomy   . Complete hysterectomy   . Ventral wall hernia   . Esophagogastroduodenoscopy 08/2010    Dr. Samuella Cota, Versed. small hh, mild prepylori gastritis. path unavailable  . Esophagogastroduodenoscopy 04/2010    Dr. Samuella Cota, Versed. small hh, mild distal esophagitis, antral gastritis and duodenitis. Bx mild chronic gastritis and no H.pylori  . Esophagogastroduodenoscopy 08/2009    Dr. Allena Katz, Versed. Moderate gastritis, moderate duodenitis with nodularity in proximal duodenal bulb, bx chronic gastritis, no helicobacter, mild chronic duodenitis  . Esophagogastroduodenoscopy 02/2007    Dr. Samuella Cota, Versed. antral gastritis and duodenal ulcers. Bx mild chronic gastritis. No bx from duodenal ulcer available.  Gaspar Bidding dilation 04/06/2011    Procedure: SAVORY DILATION;  Surgeon: Arlyce Harman, MD;  Location: [facility];  Service: Endoscopy;  Laterality: N/A;  16 mm  . Esophagogastroduodenoscopy 04/06/11    Dr. Darrick Penna: 1-2 cm hiatal hernia, mod gastritis, 10mmX3mm linear ulcer in duodenal bulb, stricture 1st part of duodenum, dilated to 12 mm  . Esophagogastroduodenoscopy 05/12/11    partial gastric oulet obstruction secondary to stricture between bulb/2nd portion of duodenum with marked friability and inflammation but no discrete ulcer, dilated stomach. s/p dilation but high risk for restenosis  . Esophagogastroduodenoscopy 06/22/2011    Procedure: ESOPHAGOGASTRODUODENOSCOPY (EGD);  Surgeon: West Bali, MD;  Location: [facility];  Service: Endoscopy;  Laterality: N/A;  . Gastrojejunostomy 06/28/2011    Procedure: GASTROJEJUNOSTOMY;  Surgeon: Fabio Bering, MD;  Location: [facility];  Service: General;  Laterality: N/A;  Roux-en-y, Choledocalduodenostomy  . Abdominal hysterectomy     Family History  Problem Relation Age of Onset  . Colon cancer Neg Hx   . Liver cancer Neg Hx   . Breast cancer Neg Hx   . Inflammatory bowel disease Neg Hx   . Heart attack  Mother     History  Substance Use Topics  . Smoking status: Never Smoker   . Smokeless tobacco: Not on file  . Alcohol Use: No   Review of Systems  Constitutional: Negative for fever.  HENT: Negative for congestion, sore throat and neck pain.   Cardiovascular: Negative for leg swelling.  Gastrointestinal: Positive for nausea. Negative for vomiting, diarrhea and blood in stool.  Genitourinary: Positive for dysuria. Negative for hematuria.  Musculoskeletal: Negative for back pain.  Skin: Negative for rash.  Neurological: Negative for headaches.  All other systems reviewed and are negative.    Allergies  Compazine; Promethazine hcl; Vistaril; Gabapentin; and Latex  Home Medications   Current Outpatient Rx  Name Route Sig Dispense Refill  . AMLODIPINE BESYLATE 5 MG PO TABS Oral Take 1 tablet (5 mg total) by mouth daily. 30 tablet 0  . ASPIRIN 325 MG PO TABS Oral Take 1 tablet (325 mg total) by mouth daily. 30 tablet 2  . PANCRELIPASE (LIP-PROT-AMYL) 12000 UNITS PO CPEP Oral Take 2 capsules by mouth 3 (three) times daily with meals. 270 capsule 2  . OXYCODONE-ACETAMINOPHEN 7.5-325 MG PO TABS Oral Take 1 tablet by mouth every 6 (six) hours as needed. For pain    . PREGABALIN 200 MG PO CAPS Oral Take 200 mg by mouth 2 (two) times daily.       BP 112/88  Pulse 90  Temp(Src) 97.9 F (36.6 C) (Oral)  Resp 18  Ht 5\' 4"  (1.626 m)  Wt 128 lb (58.06 kg)  BMI 21.97 kg/m2  SpO2 98%  Physical Exam  Nursing note and vitals reviewed. Constitutional: She is oriented to person, place, and time. She appears well-developed and well-nourished. No distress.  HENT:  Head: Normocephalic and atraumatic.       Mucous membranes a little dry.  Eyes: EOM are normal. Pupils are equal, round, and reactive to light.  Neck: Neck supple. No tracheal deviation present.  Cardiovascular: Normal rate and regular rhythm.   No murmur heard. Pulmonary/Chest: Effort normal. No respiratory distress.        Lungs clear   Abdominal: Soft. She exhibits no distension. There is tenderness. There is no guarding.       Bowel sounds decreased 10 cm incision right above bellow button drainage site. Tenderness on RUQ and RLQ.  Musculoskeletal: Normal range of motion. She exhibits no edema and no tenderness.  Lymphadenopathy:    She has no cervical adenopathy.  Neurological: She is alert and oriented to person, place, and time. No sensory deficit.  Skin: Skin is warm and dry.  Psychiatric: She has a normal mood and affect. Her behavior is normal.    ED Course  Procedures (including critical care time) DIAGNOSTIC STUDIES: Oxygen Saturation is 98% on room air, normal by my interpretation.    COORDINATION OF CARE:  Labs Reviewed  CBC - Abnormal; Notable for the following:    Hemoglobin 11.1 (*)    HCT 35.7 (*)    MCH 25.2 (*)    RDW 18.3 (*)    All other components within normal limits  DIFFERENTIAL - Abnormal; Notable for the following:    Neutrophils Relative 38 (*)  Neutro Abs 1.5 (*)    Eosinophils Relative 8 (*)    All other components within normal limits  COMPREHENSIVE METABOLIC PANEL - Abnormal; Notable for the following:    Sodium 133 (*)    Potassium 2.8 (*)    BUN 3 (*)    Albumin 2.3 (*)    AST 164 (*)    ALT 43 (*)    Alkaline Phosphatase 262 (*)    All other components within normal limits  URINALYSIS, ROUTINE W REFLEX MICROSCOPIC - Abnormal; Notable for the following:    Specific Gravity, Urine <1.005 (*)    Leukocytes, UA MODERATE (*)    All other components within normal limits  URINE MICROSCOPIC-ADD ON - Abnormal; Notable for the following:    Squamous Epithelial / LPF FEW (*)    Bacteria, UA FEW (*)    All other components within normal limits  LIPASE, BLOOD  URINE CULTURE   Dg Chest 2 View  08/29/2011  *RADIOLOGY REPORT*  Clinical Data: Abdominal pain, nausea/vomiting  CHEST - 2 VIEW  Comparison: 07/03/2011  Findings: Mild elevation of the right hemidiaphragm.   Lungs are essentially clear.  No pleural effusion or pneumothorax.  Cardiomediastinal silhouette is within normal limits.  Degenerative changes of the visualized thoracolumbar spine.  Moderate superior endplate compression deformity at T12, unchanged.  Cholecystectomy clips.  IMPRESSION: No evidence of acute cardiopulmonary disease.  Original Report Authenticated By: Charline Bills, M.D.   Ct Abdomen Pelvis W Contrast  08/29/2011  *RADIOLOGY REPORT*  Clinical Data: Abdominal pain  CT ABDOMEN AND PELVIS WITH CONTRAST  Technique:  Multidetector CT imaging of the abdomen and pelvis was performed following the standard protocol during bolus administration of intravenous contrast.  Contrast: OMNIPAQUE IOHEXOL 300 MG/ML  SOLN  Comparison: 08/24/2011  Findings: Linear right lower lobe opacity is similar to prior.  Hepatic steatosis.  Status post cholecystectomy.  Intra and extrahepatic biliary ductal dilatation.  Dilatation of the main pancreatic duct.  Multiple surgical clips along the right margin of the head of the pancreas.  There is soft tissue attenuation in this region as well.  Unremarkable spleen and adrenal glands.  Symmetric renal enhancement.  No hydronephrosis or hydroureter.  Surgical changes of gastrojejunostomy.  There is contrast within the colon, presumably from the recent CT examination, however, there has been marked interval dilatation of small bowel loops just distal to the anastomoses, measuring up to 4.9 cm. Transition point on image 47 of series 2.  Distal small bowel and colon is relatively decompressed, suggesting obstruction.  Small amount of interloop fluid.  There is nonspecific twisting of the mesentery however this appears similar to prior.  Normal caliber vasculature.  Rim enhancing 1.2 cm focus along the liver surface is again noted.  There is a nodular soft tissue attenuation adjacent to surgical clips inferior to the liver on image 41 of series 2.  Mild fluid in the right  paracolic gutter and enhancement as seen on image 40. Soft tissue anterior to the right psoas is again noted, similar in size (image 57).  Thin-walled bladder.  Absent uterus.  Small amount of free fluid within the pelvis.  No adnexal mass.  No acute osseous finding.  T12 compression fractures unchanged. There is mild retropulsion that results in central canal narrowing.  IMPRESSION: Status post gastrojejunostomy.  Interval proximal small bowel dilatation up to 4.9 cm and distal small bowel decompression raises concern for small bowel obstruction. Transition within the mid abdomen.  Ill-defined peripherally enhancing  collections/soft tissue attenuation within the abdomen may represent resolving infection, similar to prior.  Continued attention on follow-up.  Dilatation of the intra and extrahepatic bile duct is similar to prior.  Ascending infection not excluded.  Main pancreatic ductal dilatation is similar to prior.  Hepatic steatosis.  Original Report Authenticated By: Waneta Martins, M.D.   Results for orders placed during the hospital encounter of 08/28/11  CBC      Component Value Range   WBC 4.0  4.0 - 10.5 (K/uL)   RBC 4.41  3.87 - 5.11 (MIL/uL)   Hemoglobin 11.1 (*) 12.0 - 15.0 (g/dL)   HCT 16.1 (*) 09.6 - 46.0 (%)   MCV 81.0  78.0 - 100.0 (fL)   MCH 25.2 (*) 26.0 - 34.0 (pg)   MCHC 31.1  30.0 - 36.0 (g/dL)   RDW 04.5 (*) 40.9 - 15.5 (%)   Platelets 285  150 - 400 (K/uL)  DIFFERENTIAL      Component Value Range   Neutrophils Relative 38 (*) 43 - 77 (%)   Neutro Abs 1.5 (*) 1.7 - 7.7 (K/uL)   Lymphocytes Relative 44  12 - 46 (%)   Lymphs Abs 1.8  0.7 - 4.0 (K/uL)   Monocytes Relative 8  3 - 12 (%)   Monocytes Absolute 0.3  0.1 - 1.0 (K/uL)   Eosinophils Relative 8 (*) 0 - 5 (%)   Eosinophils Absolute 0.3  0.0 - 0.7 (K/uL)   Basophils Relative 1  0 - 1 (%)   Basophils Absolute 0.1  0.0 - 0.1 (K/uL)  COMPREHENSIVE METABOLIC PANEL      Component Value Range   Sodium 133 (*) 135 -  145 (mEq/L)   Potassium 2.8 (*) 3.5 - 5.1 (mEq/L)   Chloride 97  96 - 112 (mEq/L)   CO2 27  19 - 32 (mEq/L)   Glucose, Bld 87  70 - 99 (mg/dL)   BUN 3 (*) 6 - 23 (mg/dL)   Creatinine, Ser 8.11  0.50 - 1.10 (mg/dL)   Calcium 9.0  8.4 - 91.4 (mg/dL)   Total Protein 6.6  6.0 - 8.3 (g/dL)   Albumin 2.3 (*) 3.5 - 5.2 (g/dL)   AST 782 (*) 0 - 37 (U/L)   ALT 43 (*) 0 - 35 (U/L)   Alkaline Phosphatase 262 (*) 39 - 117 (U/L)   Total Bilirubin 0.8  0.3 - 1.2 (mg/dL)   GFR calc non Af Amer >90  >90 (mL/min)   GFR calc Af Amer >90  >90 (mL/min)  LIPASE, BLOOD      Component Value Range   Lipase 17  11 - 59 (U/L)  URINALYSIS, ROUTINE W REFLEX MICROSCOPIC      Component Value Range   Color, Urine YELLOW  YELLOW    APPearance CLEAR  CLEAR    Specific Gravity, Urine <1.005 (*) 1.005 - 1.030    pH 6.5  5.0 - 8.0    Glucose, UA NEGATIVE  NEGATIVE (mg/dL)   Hgb urine dipstick NEGATIVE  NEGATIVE    Bilirubin Urine NEGATIVE  NEGATIVE    Ketones, ur NEGATIVE  NEGATIVE (mg/dL)   Protein, ur NEGATIVE  NEGATIVE (mg/dL)   Urobilinogen, UA 1.0  0.0 - 1.0 (mg/dL)   Nitrite NEGATIVE  NEGATIVE    Leukocytes, UA MODERATE (*) NEGATIVE   URINE MICROSCOPIC-ADD ON      Component Value Range   Squamous Epithelial / LPF FEW (*) RARE    WBC, UA 7-10  <3 (WBC/hpf)  RBC / HPF 0-2  <3 (RBC/hpf)   Bacteria, UA FEW (*) RARE      1. SBO (small bowel obstruction)   2. Abdominal pain   3. Urinary tract infection   4. Hypokalemia       MDM  Patient presents with new Donald pain to the right flank started 2 days ago associated with nausea no vomiting CT scan raises concern for possible small bowel obstruction. Patient in April of this year with a bleeding ulcer requiring partial removal of her stomach and a gastrojejunostomy. By general surgery here. Patient had gallbladder removal 4 years ago. Since the procedure in April patient has had persistent pain however this pain today was new in location and  characteristics. Additional findings of significance no leukocytosis questionable urinary tract infection given a gram of Rocephin in the emergency department for this and urine culture sent in addition she has hypokalemia patient received 40 mEq of potassium by mouth. LFTs with some mild elevation in the AST compared to recent LFTs. We'll discuss with her surgeon suspected admission and observation will be required.  Discussed with general surgery they will admit temporary admission orders done.   I personally performed the services described in this documentation, which was scribed in my presence. The recorded information has been reviewed and considered.          Jordan Jakes, MD 08/29/11 [id number]  Jordan Jakes, MD 08/29/11 [phone]

## 2011-08-28 NOTE — ED Notes (Signed)
Attempted IV twice without success.  RN requested another nurse to evaluate.

## 2011-08-28 NOTE — ED Notes (Addendum)
Third RN also attempted IV twice - unsuccessful.  Efforts are being made to start the IV with #20 ga large vein to accommodate the CT scan contrast.  MD advised of difficulty establishing IV for same and also that CT was done per patient 5 days ago.  New orders received.

## 2011-08-28 NOTE — ED Notes (Signed)
Attempted IV x 2 without success.  Patient tolerated procedure well.  Significant other at bedside.

## 2011-08-28 NOTE — ED Notes (Addendum)
Patient complaining of right sided abdominal pain radiating into back x 2 days. Also complaining of nausea. Patient states she had abdominal surgery 1 month ago.

## 2011-08-29 ENCOUNTER — Encounter (HOSPITAL_COMMUNITY): Payer: Self-pay | Admitting: *Deleted

## 2011-08-29 LAB — CBC
Hemoglobin: 10.5 g/dL — ABNORMAL LOW (ref 12.0–15.0)
MCH: 25.7 pg — ABNORMAL LOW (ref 26.0–34.0)
MCHC: 31.7 g/dL (ref 30.0–36.0)
MCV: 81.1 fL (ref 78.0–100.0)
RBC: 4.08 MIL/uL (ref 3.87–5.11)

## 2011-08-29 LAB — URINALYSIS, ROUTINE W REFLEX MICROSCOPIC
Glucose, UA: NEGATIVE mg/dL
Hgb urine dipstick: NEGATIVE
Specific Gravity, Urine: <1.005 — ABNORMAL LOW (ref 1.005–1.030)

## 2011-08-29 LAB — URINE MICROSCOPIC-ADD ON

## 2011-08-29 MED ORDER — LORAZEPAM 1 MG PO TABS
1.0000 mg | ORAL_TABLET | Freq: Four times a day (QID) | ORAL | Status: DC | PRN
  Administered 2011-08-31: 1 mg via ORAL
  Filled 2011-08-29: qty 1

## 2011-08-29 MED ORDER — ONDANSETRON HCL 4 MG/2ML IJ SOLN
4.0000 mg | Freq: Four times a day (QID) | INTRAMUSCULAR | Status: DC | PRN
Start: 2011-08-29 — End: 2011-09-01
  Administered 2011-08-30 – 2011-08-31 (×2): 4 mg via INTRAVENOUS
  Filled 2011-08-29 (×2): qty 2

## 2011-08-29 MED ORDER — OXYCODONE HCL 5 MG PO TABS
5.0000 mg | ORAL_TABLET | ORAL | Status: DC | PRN
  Administered 2011-08-31 – 2011-09-01 (×3): 5 mg via ORAL
  Filled 2011-08-29 (×3): qty 1

## 2011-08-29 MED ORDER — PANTOPRAZOLE SODIUM 40 MG IV SOLR
40.0000 mg | Freq: Every day | INTRAVENOUS | Status: DC
  Administered 2011-08-29 – 2011-08-30 (×2): 40 mg via INTRAVENOUS
  Filled 2011-08-29 (×2): qty 40

## 2011-08-29 MED ORDER — SODIUM CHLORIDE 0.9 % IJ SOLN
INTRAMUSCULAR | Status: AC
  Administered 2011-08-29: 10 mL
  Filled 2011-08-29: qty 3

## 2011-08-29 MED ORDER — ONDANSETRON HCL 4 MG/2ML IJ SOLN: 4.0000 mg | Freq: Three times a day (TID) | INTRAMUSCULAR | Status: AC | PRN

## 2011-08-29 MED ORDER — POTASSIUM CHLORIDE CRYS ER 20 MEQ PO TBCR
40.0000 meq | EXTENDED_RELEASE_TABLET | Freq: Once | ORAL | Status: AC
  Administered 2011-08-29: 40 meq via ORAL
  Filled 2011-08-29: qty 2

## 2011-08-29 MED ORDER — HYDROMORPHONE HCL PF 1 MG/ML IJ SOLN
1.0000 mg | Freq: Once | INTRAMUSCULAR | Status: AC
  Administered 2011-08-29: 1 mg via INTRAVENOUS
  Filled 2011-08-29: qty 1

## 2011-08-29 MED ORDER — HYDROMORPHONE HCL PF 1 MG/ML IJ SOLN
1.0000 mg | INTRAMUSCULAR | Status: DC | PRN
  Administered 2011-08-29 – 2011-09-01 (×16): 1 mg via INTRAVENOUS
  Filled 2011-08-29 (×16): qty 1

## 2011-08-29 MED ORDER — HYDROMORPHONE HCL PF 1 MG/ML IJ SOLN
1.0000 mg | INTRAMUSCULAR | Status: AC | PRN
  Administered 2011-08-29 (×2): 1 mg via INTRAVENOUS
  Filled 2011-08-29 (×2): qty 1

## 2011-08-29 MED ORDER — DEXTROSE 5 % IV SOLN
1.0000 g | Freq: Once | INTRAVENOUS | Status: AC
  Administered 2011-08-29: 1 g via INTRAVENOUS
  Filled 2011-08-29: qty 10

## 2011-08-29 MED ORDER — SODIUM CHLORIDE 0.9 % IV SOLN: INTRAVENOUS | Status: AC

## 2011-08-29 MED ORDER — ENOXAPARIN SODIUM 40 MG/0.4ML ~~LOC~~ SOLN
40.0000 mg | SUBCUTANEOUS | Status: DC
  Administered 2011-08-29 – 2011-08-30 (×2): 40 mg via SUBCUTANEOUS
  Filled 2011-08-29 (×3): qty 0.4

## 2011-08-29 MED ORDER — IOHEXOL 300 MG/ML  SOLN
100.0000 mL | Freq: Once | INTRAMUSCULAR | Status: AC | PRN
  Administered 2011-08-29: 100 mL via INTRAVENOUS

## 2011-08-29 NOTE — ED Notes (Signed)
Patient lying on right side; states feels much better if she lies on her side and is still.  States pain medication helped with pain.

## 2011-08-29 NOTE — ED Notes (Signed)
Has drank 125 ml of contrast.

## 2011-08-30 LAB — COMPREHENSIVE METABOLIC PANEL
Albumin: 1.9 g/dL — ABNORMAL LOW (ref 3.5–5.2)
Alkaline Phosphatase: 204 U/L — ABNORMAL HIGH (ref 39–117)
BUN: <3 mg/dL — ABNORMAL LOW (ref 6–23)
Chloride: 103 mEq/L (ref 96–112)
Glucose, Bld: 83 mg/dL (ref 70–99)
Potassium: 3.1 mEq/L — ABNORMAL LOW (ref 3.5–5.1)
Total Bilirubin: 0.4 mg/dL (ref 0.3–1.2)

## 2011-08-30 LAB — CBC
MCHC: 31.7 g/dL (ref 30.0–36.0)
MCV: 80.2 fL (ref 78.0–100.0)
Platelets: 237 10*3/uL (ref 150–400)
RDW: 18.6 % — ABNORMAL HIGH (ref 11.5–15.5)
WBC: 4.3 10*3/uL (ref 4.0–10.5)

## 2011-08-30 MED ORDER — SODIUM CHLORIDE 0.9 % IJ SOLN
INTRAMUSCULAR | Status: AC
  Administered 2011-08-30: 5 mL
  Filled 2011-08-30: qty 3

## 2011-08-30 MED ORDER — LIDOCAINE HCL (PF) 1 % IJ SOLN
INTRAMUSCULAR | Status: AC
  Filled 2011-08-30: qty 5

## 2011-08-30 MED ORDER — LIDOCAINE HCL (PF) 1 % IJ SOLN
INTRAMUSCULAR | Status: AC
  Filled 2011-08-30: qty 10

## 2011-08-30 NOTE — Progress Notes (Signed)
INITIAL ADULT NUTRITION ASSESSMENT Date: 08/30/2011   Time: 10:12 AM Reason for Assessment: Nutrition Risk-hx of wt loss  ASSESSMENT: Female 56 y.o.  Past Medical History  Diagnosis Date  . RA (rheumatoid arthritis)  2008  . MI (myocardial infarction) 2008    Not well documented, patient reports reassuring cardiac catheterization and stress testing in [place]  . Constipation 03/21/2011  . Hypokalemia 03/21/2011  . Fatty liver 03/21/2011  . Pancreatitis     states 3 years ago, very severe, ?biliary pancreatitis   . T12 compression fracture 2011  . Duodenal ulcer     remote per patient. +BC powders, patient report negative EGD six months ago  . Dilated pancreatic duct     ?chronic pancreatitis, EUS pending (06/02/11)      Scheduled Meds:   . sodium chloride   Intravenous STAT  . enoxaparin  40 mg Subcutaneous Q24H  . pantoprazole (PROTONIX) IV  40 mg Intravenous QHS  . sodium chloride  250 mL Intravenous Once  . sodium chloride       Continuous Infusions:   . sodium chloride 100 mL/hr at 08/30/11 0939   PRN Meds:.HYDROmorphone (DILAUDID) injection, HYDROmorphone (DILAUDID) injection, LORazepam, ondansetron (ZOFRAN) IV, ondansetron, oxyCODONE  Ht: 5\' 4"  (162.6 cm)  Wt: 131 lb (59.421 kg)  Ideal Wt: 54.7 kg % Ideal Wt: 108%  Usual Wt: 133# (07/26/11) % Usual Wt: 98%  Body mass index is 22.49 kg/(m^2).Normal range  Food/Nutrition Related Hx: Pt assessed during recent admission 08/06/11 due to wt loss hx. Wt stable x 30d. Pt adamantly refuses protein [employer]  (which is appropriate for clear liquid diet. She says that she is going home tomorrow. Pt at risk for malnutrition due to altered GI function.   CMP     Component Value Date/Time   NA 136 08/30/2011 0438   K 3.1* 08/30/2011 0438   CL 103 08/30/2011 0438   CO2 24 08/30/2011 0438   GLUCOSE 83 08/30/2011 0438   BUN <3* 08/30/2011 0438   CREATININE 0.36* 08/30/2011 0438   CALCIUM 8.0* 08/30/2011  0438   PROT 5.3* 08/30/2011 0438   ALBUMIN 1.9* 08/30/2011 0438   AST 60* 08/30/2011 0438   ALT 23 08/30/2011 0438   ALKPHOS 204* 08/30/2011 0438   BILITOT 0.4 08/30/2011 0438   GFRNONAA >90 08/30/2011 0438   GFRAA >90 08/30/2011 0438     Intake/Output Summary (Last 24 hours) at 08/30/11 1016 Last data filed at 08/30/11 0522  Gross per 24 hour  Intake 3063.33 ml  Output      0 ml  Net 3063.33 ml    Diet Order: Clear Liquid  Supplements/Tube Feeding:none at this time  IVF:    sodium chloride Last Rate: 100 mL/hr at 08/30/11 0939    Estimated Nutritional Needs:   Kcal:1593-1770 kcal/day Protein:83-94 gr/day Fluid:1 ml/kcal  NUTRITION DIAGNOSIS:  -Altered GI function (NI-1.4). Status: Ongoing   RELATED TO: recent antrectomy and gastrojejunostomy 06/28/11,  chronic pancreatitis   AS EVIDENCE BY: pt hx   MONITORING/EVALUATION(Goals):  -Monitor diet advancement, po intake of meals, labs  -Pt will tol diet advancement to meet minimum >85% est nutritional needs   EDUCATION NEEDS:  -Education needs addressed   INTERVENTION:  Recommend when diet is advanced: -Offer snack between meals  Dietitian [phone]  DOCUMENTATION CODES Per approved criteria  -Not Applicable    Francene Boyers 08/30/2011, 10:12 AM

## 2011-08-30 NOTE — Progress Notes (Signed)
Walked into patient's room and patient was eating french fries. Patient educated on her diet of Dys 3 and low fat. Patient voiced understanding and stated she'll stay away from fatty food. Patient denies any needs at this time. Will continue to monitor.

## 2011-08-30 NOTE — Care Management Note (Signed)
    Page 1 of 2   09/01/2011     11:52:41 AM   CARE MANAGEMENT NOTE 09/01/2011  Patient:  Jordan, Haney   Account Number:  [id number]  Date Initiated:  08/30/2011  Documentation initiated by:  Sharrie Rothman  Subjective/Objective Assessment:   Pt admitted from home with abd pain. Pt currently is active with [facility] in [place], [place]. Pt is independent with ADL's PTA. Pt will return home at discharge.     Action/Plan:   CM will arrange resumption of HH services at discharge. Pt denies needing any other HH or DMe needs.   Anticipated DC Date:  09/01/2011   Anticipated DC Plan:  HOME W HOME HEALTH SERVICES      DC Planning Services  CM consult      [facility] Choice  Resumption Of Svcs/PTA Provider   Choice offered to / List presented to:  C-1 Patient        HH arranged  HH-1 RN      [facility] agency  [facility]   Status of service:  Completed, signed off Medicare Important Message given?   (If response is "NO", the following Medicare IM given date fields will be blank) Date Medicare IM given:   Date Additional Medicare IM given:    Discharge Disposition:  HOME W HOME HEALTH SERVICES  Per UR Regulation:    If discussed at Long Length of Stay Meetings, dates discussed:    Comments:  09/01/11 1149 Arlyss Queen, RN BSN CM Pt discharged home today with [facility] [place] Orders and face to face faxed to [facility]. No DME needs noted. Pt and pts nurse is aware of pts discharge arrangements.  08/30/11 0931 Arlyss Queen, RN BSN CM

## 2011-08-31 LAB — URINE CULTURE: Culture  Setup Time: 201306091927

## 2011-08-31 LAB — COMPREHENSIVE METABOLIC PANEL
AST: 48 U/L — ABNORMAL HIGH (ref 0–37)
Albumin: 2.2 g/dL — ABNORMAL LOW (ref 3.5–5.2)
Calcium: 8.3 mg/dL — ABNORMAL LOW (ref 8.4–10.5)
Chloride: 105 mEq/L (ref 96–112)
Creatinine, Ser: 0.4 mg/dL — ABNORMAL LOW (ref 0.50–1.10)
Total Bilirubin: 0.4 mg/dL (ref 0.3–1.2)
Total Protein: 5.7 g/dL — ABNORMAL LOW (ref 6.0–8.3)

## 2011-08-31 LAB — CBC
HCT: 32.5 % — ABNORMAL LOW (ref 36.0–46.0)
Hemoglobin: 10.3 g/dL — ABNORMAL LOW (ref 12.0–15.0)
MCH: 25.6 pg — ABNORMAL LOW (ref 26.0–34.0)
MCV: 80.8 fL (ref 78.0–100.0)
RBC: 4.02 MIL/uL (ref 3.87–5.11)

## 2011-08-31 MED ORDER — SODIUM CHLORIDE 0.9 % IJ SOLN
INTRAMUSCULAR | Status: AC
  Administered 2011-08-31: 15:00:00
  Filled 2011-08-31: qty 3

## 2011-08-31 MED ORDER — SODIUM CHLORIDE 0.9 % IJ SOLN
INTRAMUSCULAR | Status: AC
  Filled 2011-08-31: qty 3

## 2011-08-31 MED ORDER — LEVOFLOXACIN 500 MG PO TABS
500.0000 mg | ORAL_TABLET | Freq: Every day | ORAL | Status: DC
  Administered 2011-08-31 – 2011-09-01 (×2): 500 mg via ORAL
  Filled 2011-08-31 (×2): qty 1

## 2011-08-31 MED ORDER — PANTOPRAZOLE SODIUM 40 MG PO TBEC
40.0000 mg | DELAYED_RELEASE_TABLET | Freq: Every day | ORAL | Status: DC
  Administered 2011-08-31: 40 mg via ORAL
  Filled 2011-08-31: qty 1

## 2011-08-31 NOTE — Progress Notes (Signed)
UR chart review completed.  

## 2011-08-31 NOTE — Progress Notes (Signed)
The patient is receiving Protonix by the intravenous route.  Based on criteria approved by the Pharmacy and Therapeutics Committee and the Medical Executive Committee, the medication is being converted to the equivalent oral dose form.  These criteria include: -No Active GI bleeding -Able to tolerate diet of full liquids (or better) or tube feeding OR able to tolerate other medications by the oral or enteral route  If you have any questions about this conversion, please contact the Pharmacy Department (ext [phone]).  Thank you.  Jordan Haney, [place] 08/31/2011 10:23 AM

## 2011-08-31 NOTE — Progress Notes (Signed)
Patient was found@ approximately 2120  sitting in "[place] style pose" with folded arms next to her bed. She stated that she fell and was unable to get up. Vitals were taken with the following results : BP125/64 HR 82 T98.0.  Patient denied any pain or injury. MD was notified, no new orders were given.  [place], saftey plan  and the movement to a camera ready was initiated.

## 2011-09-01 ENCOUNTER — Encounter (HOSPITAL_COMMUNITY): Admission: RE | Payer: Self-pay | Source: Ambulatory Visit

## 2011-09-01 ENCOUNTER — Ambulatory Visit (HOSPITAL_COMMUNITY): Admission: RE | Admit: 2011-09-01 | Source: Ambulatory Visit | Admitting: General Surgery

## 2011-09-01 LAB — COMPREHENSIVE METABOLIC PANEL
BUN: <3 mg/dL — ABNORMAL LOW (ref 6–23)
CO2: 27 mEq/L (ref 19–32)
Calcium: 8.5 mg/dL (ref 8.4–10.5)
Chloride: 105 mEq/L (ref 96–112)
Creatinine, Ser: 0.46 mg/dL — ABNORMAL LOW (ref 0.50–1.10)
GFR calc Af Amer: >90 mL/min (ref >90)
GFR calc non Af Amer: >90 mL/min (ref >90)
Glucose, Bld: 83 mg/dL (ref 70–99)
Total Bilirubin: 0.4 mg/dL (ref 0.3–1.2)

## 2011-09-01 LAB — CBC
Hemoglobin: 10.2 g/dL — ABNORMAL LOW (ref 12.0–15.0)
MCHC: 32.4 g/dL (ref 30.0–36.0)
Platelets: 241 10*3/uL (ref 150–400)
RBC: 3.95 MIL/uL (ref 3.87–5.11)

## 2011-09-01 SURGERY — FOREIGN BODY REMOVAL ADULT
Anesthesia: Choice

## 2011-09-01 MED ORDER — POTASSIUM CHLORIDE CRYS ER 20 MEQ PO TBCR
40.0000 meq | EXTENDED_RELEASE_TABLET | Freq: Once | ORAL | Status: AC
  Administered 2011-09-01: 40 meq via ORAL
  Filled 2011-09-01: qty 2

## 2011-09-01 MED ORDER — OXYCODONE-ACETAMINOPHEN 7.5-325 MG PO TABS: 1.0000 | ORAL_TABLET | Freq: Four times a day (QID) | ORAL | Status: DC | PRN

## 2011-09-01 MED ORDER — LEVOFLOXACIN 500 MG PO TABS: 500.0000 mg | ORAL_TABLET | Freq: Every day | ORAL | Status: AC

## 2011-09-01 NOTE — Progress Notes (Signed)
Pt discharged home today per Dr. Leticia Penna. Pt's IV site D/C'd and WNL. Pt's VS stable at this time. Pt provided with home medication list, discharge instructions and prescriptions. Pt verbalized understanding. Pt left floor via WC in stable condition accompanied by NT.

## 2011-09-01 NOTE — Progress Notes (Signed)
  Subjective: Pain remains unchanged. Dilaudid is only somewhat affecting her symptomatology. She still complains of mostly right upper quadrant abdominal tenderness. Drainage is minimal but persistent from her midline incision. She does not have much of an appetite today.  Objective: Vital signs in last 24 hours: Temp:  [98 F (36.7 C)-98.7 F (37.1 C)] 98.1 F (36.7 C) (06/12 0546) Pulse Rate:  [73-85] 73  (06/12 0546) Resp:  [16] 16  (06/12 0546) BP: (123-135)/(83-89) 123/83 mmHg (06/12 0546) SpO2:  [93 %-96 %] 93 % (06/12 0546) Last BM Date: 08/28/11  Intake/Output from previous day: 06/11 0701 - 06/12 0700 In: 3005 [P.O.:640; I.V.:2365] Out: -  Intake/Output this shift: Total I/O In: 240 [P.O.:240] Out: -   General appearance: alert and no distress GI: Positive bowel sounds, soft, mild to moderate and distractible right upper quadrant abdominal tenderness. No diffuse peritoneal signs. Minimal drainage from her midline open wound.  Lab Results:   [facility] 09/01/11 0610 08/31/11 0535  WBC 4.2 3.9*  HGB 10.2* 10.3*  HCT 31.5* 32.5*  PLT 241 212   BMET  Basename 09/01/11 0610 08/31/11 0535  NA 140 140  K 2.9* 3.1*  CL 105 105  CO2 27 26  GLUCOSE 83 92  BUN <3* <3*  CREATININE 0.46* 0.40*  CALCIUM 8.5 8.3*   PT/INR No results found for this basename: LABPROT:2,INR:2 in the last 72 hours ABG No results found for this basename: PHART:2,PCO2:2,PO2:2,HCO3:2 in the last 72 hours  Studies/Results: No results found.  Anti-infectives: Anti-infectives     Start     Dose/Rate Route Frequency Ordered Stop   09/01/11 0000   levofloxacin (LEVAQUIN) 500 MG tablet        500 mg Oral Daily 09/01/11 1119 09/11/11 2359   08/31/11 1400   levofloxacin (LEVAQUIN) tablet 500 mg        500 mg Oral Daily 08/31/11 1358 09/05/11 0959   08/29/11 0415   cefTRIAXone (ROCEPHIN) 1 g in dextrose 5 % 50 mL IVPB        1 g 100 mL/hr over 30 Minutes Intravenous  Once 08/29/11 0402  08/29/11 0450          Assessment/Plan: s/p * No surgery found * Right upper quadrant abdominal tenderness. Suspected stitch granuloma at her midline incision. Continue IV hydration. Advance diet as tolerated.  Procedure: This patient was scheduled to have a wound exploration in the minor procedure room this Wednesday I did opt to proceed at the bedside. Pain medication was administered and local anesthetic was utilized to anesthetize around the open wound. I did open the wound slightly to approximately 1.5-2 cm enlarged adequately identified the previously placed Novafil stitch. The knot was identified and was removed without difficulties. Patient was extremely anxious throughout the procedure he complained of significant pressure sensation through most of the procedure. I did attempt to reassure the patient however she is still extremely anxious even upon completion of the procedure. Continue local wound care.  LOS: 4 days    Jordan Haney C 09/01/2011

## 2011-09-01 NOTE — Progress Notes (Signed)
  Subjective: Still with right upper quadrant and flank tenderness. Now complains of suprapubic pressure sensation. She now complains of some burning with urination which is not reported previously. She denies any odor to the urine. She denies any fevers or chills. She still has exquisite tenderness around the midline incision. Still complains of significant uncontrolled right upper quadrant abdominal tenderness.  Objective: Vital signs in last 24 hours: Temp:  [98 F (36.7 C)-98.7 F (37.1 C)] 98.1 F (36.7 C) (06/12 0546) Pulse Rate:  [73-85] 73  (06/12 0546) Resp:  [16] 16  (06/12 0546) BP: (123-135)/(83-89) 123/83 mmHg (06/12 0546) SpO2:  [93 %-96 %] 93 % (06/12 0546) Last BM Date: 08/28/11  Intake/Output from previous day: 06/11 0701 - 06/12 0700 In: 3005 [P.O.:640; I.V.:2365] Out: -  Intake/Output this shift: Total I/O In: 240 [P.O.:240] Out: -   General appearance: alert and no distress Resp: clear to auscultation bilaterally Cardio: regular rate and rhythm GI: Positive bowel sounds, soft, flat, mild tenderness around her midline incision with distraction. Minimal serous sanguinous discharge. Overall wound appears clean. No peritoneal signs are elicited.  Lab Results:   [facility] 09/01/11 0610 08/31/11 0535  WBC 4.2 3.9*  HGB 10.2* 10.3*  HCT 31.5* 32.5*  PLT 241 212   BMET  Basename 09/01/11 0610 08/31/11 0535  NA 140 140  K 2.9* 3.1*  CL 105 105  CO2 27 26  GLUCOSE 83 92  BUN <3* <3*  CREATININE 0.46* 0.40*  CALCIUM 8.5 8.3*   PT/INR No results found for this basename: LABPROT:2,INR:2 in the last 72 hours ABG No results found for this basename: PHART:2,PCO2:2,PO2:2,HCO3:2 in the last 72 hours  Studies/Results: No results found.  Anti-infectives: Anti-infectives     Start     Dose/Rate Route Frequency Ordered Stop   09/01/11 0000   levofloxacin (LEVAQUIN) 500 MG tablet        500 mg Oral Daily 09/01/11 1119 09/11/11 2359   08/31/11 1400    levofloxacin (LEVAQUIN) tablet 500 mg        500 mg Oral Daily 08/31/11 1358 09/05/11 0959   08/29/11 0415   cefTRIAXone (ROCEPHIN) 1 g in dextrose 5 % 50 mL IVPB        1 g 100 mL/hr over 30 Minutes Intravenous  Once 08/29/11 0402 08/29/11 0450          Assessment/Plan: s/p * No surgery found * Suspected urinary tract infection. At this time I will resume antibiotics to treat a suspected urinary tract infection as clinically her symptomatology is more consistent with a urinary tract etiology versus a true intra-abdominal process. I continue to reassure the patient regarding her midline incision. This will heal by secondary intention. The wound appears clean and viable. No evidence of any infection or erythema. I did offer patient alternative options for pain control and pain management. Patient wishes to proceed at this time with her current previous oral regiment.  LOS: 4 days    Lang Zingg C 09/01/2011

## 2011-09-01 NOTE — H&P (Signed)
Jordan Haney is an 56 y.o. female.   Chief Complaint: Increased abdominal pain HPI: Patient is well-known to me and presents again to the hospital with increased and uncontrolled abdominal pain. She states that he did that the lot ignore the Percocet that she has at home have been helping. Pain is now in the right upper quadrant and no longer in the epigastric area and she has complained of in the last several dimensions. She denies any emesis but has had associated nausea. She has chills but no subjective fevers. No change in bowel movements. She has had normal bowel function. No melena no hematochezia. She denies any significant change with urination at time of admission. She denies any recent similar symptomatology although does state that she has had previous history of renal stones on that side which presented somewhat in the same manner. She continues to have drainage from her midline incision.  Past Medical History  Diagnosis Date  . RA (rheumatoid arthritis)  2008  . MI (myocardial infarction) 2008    Not well documented, patient reports reassuring cardiac catheterization and stress testing in [place]  . Constipation 03/21/2011  . Hypokalemia 03/21/2011  . Fatty liver 03/21/2011  . Pancreatitis     states 3 years ago, very severe, ?biliary pancreatitis   . T12 compression fracture 2011  . Duodenal ulcer     remote per patient. +BC powders, patient report negative EGD six months ago  . Dilated pancreatic duct     ?chronic pancreatitis, EUS pending (06/02/11)    Past Surgical History  Procedure Date  . Cholecystectomy   . Knee surgery   . Appendectomy   . Complete hysterectomy   . Ventral wall hernia   . Esophagogastroduodenoscopy 08/2010    Dr. Samuella Cota, [place]. small hh, mild prepylori gastritis. path unavailable  . Esophagogastroduodenoscopy 04/2010    Dr. Samuella Cota, [place]. small hh, mild distal esophagitis, antral gastritis and duodenitis. Bx mild chronic gastritis and no  H.pylori  . Esophagogastroduodenoscopy 08/2009    Dr. Allena Katz, [place]. Moderate gastritis, moderate duodenitis with nodularity in proximal duodenal bulb, bx chronic gastritis, no helicobacter, mild chronic duodenitis  . Esophagogastroduodenoscopy 02/2007    Dr. Samuella Cota, [place]. antral gastritis and duodenal ulcers. Bx mild chronic gastritis. No bx from duodenal ulcer available.  Gaspar Bidding dilation 04/06/2011    Procedure: SAVORY DILATION;  Surgeon: Arlyce Harman, MD;  Location: [facility];  Service: Endoscopy;  Laterality: N/A;  16 mm  . Esophagogastroduodenoscopy 04/06/11    Dr. Darrick Penna: 1-2 cm hiatal hernia, mod gastritis, 63mmX3mm linear ulcer in duodenal bulb, stricture 1st part of duodenum, dilated to 12 mm  . Esophagogastroduodenoscopy 05/12/11    partial gastric oulet obstruction secondary to stricture between bulb/2nd portion of duodenum with marked friability and inflammation but no discrete ulcer, dilated stomach. s/p dilation but high risk for restenosis  . Esophagogastroduodenoscopy 06/22/2011    Procedure: ESOPHAGOGASTRODUODENOSCOPY (EGD);  Surgeon: West Bali, MD;  Location: [facility];  Service: Endoscopy;  Laterality: N/A;  . Gastrojejunostomy 06/28/2011    Procedure: GASTROJEJUNOSTOMY;  Surgeon: Fabio Bering, MD;  Location: [facility];  Service: General;  Laterality: N/A;  Roux-en-y, Choledocalduodenostomy  . Abdominal hysterectomy     Family History  Problem Relation Age of Onset  . Colon cancer Neg Hx   . Liver cancer Neg Hx   . Breast cancer Neg Hx   . Inflammatory bowel disease Neg Hx   . Heart attack Mother    Social History:  reports  that she has never smoked. She does not have any smokeless tobacco history on file. She reports that she does not drink alcohol or use illicit drugs.  Allergies:  Allergies  Allergen Reactions  . Compazine Anaphylaxis  . Promethazine Hcl Anaphylaxis  . Vistaril (Hydroxyzine Hcl) Other (See Comments)    Reaction: legs shake  . Gabapentin  Other (See Comments)    Reactions: legs shake  . Latex Swelling    Medications Prior to Admission  Medication Sig Dispense Refill  . amLODipine (NORVASC) 5 MG tablet Take 1 tablet (5 mg total) by mouth daily.  30 tablet  0  . aspirin 325 MG tablet Take 1 tablet (325 mg total) by mouth daily.  30 tablet  2  . lipase/protease/amylase (CREON-10/PANCREASE) 12000 UNITS CPEP Take 2 capsules by mouth 3 (three) times daily with meals.  270 capsule  2  . pregabalin (LYRICA) 200 MG capsule Take 200 mg by mouth 2 (two) times daily.       Marland Kitchen DISCONTD: oxyCODONE-acetaminophen (PERCOCET) 7.5-325 MG per tablet Take 1 tablet by mouth every 6 (six) hours as needed. For pain        Results for orders placed during the hospital encounter of 08/28/11 (from the past 48 hour(s))  COMPREHENSIVE METABOLIC PANEL     Status: Abnormal   Collection Time   08/31/11  5:35 AM      Component Value Range Comment   Sodium 140  135 - 145 mEq/L    Potassium 3.1 (*) 3.5 - 5.1 mEq/L    Chloride 105  96 - 112 mEq/L    CO2 26  19 - 32 mEq/L    Glucose, Bld 92  70 - 99 mg/dL    BUN <3 (*) 6 - 23 mg/dL    Creatinine, Ser 1.61 (*) 0.50 - 1.10 mg/dL    Calcium 8.3 (*) 8.4 - 10.5 mg/dL    Total Protein 5.7 (*) 6.0 - 8.3 g/dL    Albumin 2.2 (*) 3.5 - 5.2 g/dL    AST 48 (*) 0 - 37 U/L    ALT 22  0 - 35 U/L    Alkaline Phosphatase 220 (*) 39 - 117 U/L    Total Bilirubin 0.4  0.3 - 1.2 mg/dL    GFR calc non Af Amer >90  >90 mL/min    GFR calc Af Amer >90  >90 mL/min   CBC     Status: Abnormal   Collection Time   08/31/11  5:35 AM      Component Value Range Comment   WBC 3.9 (*) 4.0 - 10.5 K/uL    RBC 4.02  3.87 - 5.11 MIL/uL    Hemoglobin 10.3 (*) 12.0 - 15.0 g/dL    HCT 09.6 (*) 04.5 - 46.0 %    MCV 80.8  78.0 - 100.0 fL    MCH 25.6 (*) 26.0 - 34.0 pg    MCHC 31.7  30.0 - 36.0 g/dL    RDW 40.9 (*) 81.1 - 15.5 %    Platelets 212  150 - 400 K/uL   COMPREHENSIVE METABOLIC PANEL     Status: Abnormal   Collection Time    09/01/11  6:10 AM      Component Value Range Comment   Sodium 140  135 - 145 mEq/L    Potassium 2.9 (*) 3.5 - 5.1 mEq/L    Chloride 105  96 - 112 mEq/L    CO2 27  19 - 32 mEq/L  Glucose, Bld 83  70 - 99 mg/dL    BUN <3 (*) 6 - 23 mg/dL    Creatinine, Ser 1.61 (*) 0.50 - 1.10 mg/dL    Calcium 8.5  8.4 - 09.6 mg/dL    Total Protein 5.7 (*) 6.0 - 8.3 g/dL    Albumin 2.0 (*) 3.5 - 5.2 g/dL    AST 33  0 - 37 U/L    ALT 16  0 - 35 U/L    Alkaline Phosphatase 206 (*) 39 - 117 U/L    Total Bilirubin 0.4  0.3 - 1.2 mg/dL    GFR calc non Af Amer >90  >90 mL/min    GFR calc Af Amer >90  >90 mL/min   CBC     Status: Abnormal   Collection Time   09/01/11  6:10 AM      Component Value Range Comment   WBC 4.2  4.0 - 10.5 K/uL    RBC 3.95  3.87 - 5.11 MIL/uL    Hemoglobin 10.2 (*) 12.0 - 15.0 g/dL    HCT 04.5 (*) 40.9 - 46.0 %    MCV 79.7  78.0 - 100.0 fL    MCH 25.8 (*) 26.0 - 34.0 pg    MCHC 32.4  30.0 - 36.0 g/dL    RDW 81.1 (*) 91.4 - 15.5 %    Platelets 241  150 - 400 K/uL    No results found.  Review of Systems  Constitutional: Positive for chills and malaise/fatigue.  Eyes: Negative.   Respiratory: Negative.   Cardiovascular: Negative.   Gastrointestinal: Positive for nausea and abdominal pain (right upper quadrant radiation to the back). Negative for diarrhea, constipation, blood in stool and melena.  Genitourinary: Negative.   Musculoskeletal: Negative.   Skin:       Continue drainage from her midline incision wound.  Neurological: Positive for weakness and headaches.  Endo/Heme/Allergies: Negative.   Psychiatric/Behavioral: Negative.     Blood pressure 123/83, pulse 73, temperature 98.1 F (36.7 C), temperature source Oral, resp. rate 16, height 5\' 4"  (1.626 m), weight 59.421 kg (131 lb), SpO2 93.00%. Physical Exam  Constitutional: She is oriented to person, place, and time. She appears well-developed and well-nourished. No distress.       Anxious  HENT:  Head:  Normocephalic and atraumatic.  Eyes: Conjunctivae and EOM are normal. Pupils are equal, round, and reactive to light. No scleral icterus.  Neck: Neck supple. No tracheal deviation present. No thyromegaly present.  Cardiovascular: Normal rate and regular rhythm.   Respiratory: Effort normal and breath sounds normal. No respiratory distress. She has no wheezes.  GI: Soft. Bowel sounds are normal. She exhibits no distension and no mass. There is tenderness (right upper quadrant abdominal tenderness.she is somewhat distractible during evaluation. No diffuse peritoneal signs. Some tenderness around her midline incision.). There is no rebound and no guarding.       Midline incision demonstrates a small amount of serous sanguinous discharge from the midportion of the incision. There is an open approximate 1 cm wound.  Musculoskeletal: Normal range of motion.  Lymphadenopathy:    She has no cervical adenopathy.  Neurological: She is alert and oriented to person, place, and time.  Skin: Skin is warm and dry.     Assessment/Plan Right upper quadrant abdominal pain. Based on her clinical presentation I have a low suspicion that the patient has another exacerbation of her chronic pancreatitis although this is definitely a possibility. Her laboratory evaluation does have  a slight elevation in her hepatic panel however her total bilirubin remains normal and I do not feel that this is likely due to the obstruction of her common bile duct at this time. As her stools have remained normal with no acholic stools I feel she has a patent: choledochoduodenostomy.  A renal etiology is also a possibility however no blood was evident in her urinalysis. I have a low suspicion of a renal stone however she does have a history of stones and will continue to hydrate the patient. I do not feel antibiotics are appropriate at this time as her leukocyte count is normal. She did receive a dose of Rocephin in the emergency department. A  continue to manage with IV fluid hydration and monitoring.  Ruqaya Strauss C 09/01/2011, 12:07 PM

## 2011-09-01 NOTE — Discharge Summary (Signed)
Physician Discharge Summary  Patient ID: Jordan Haney MRN: [id number] DOB/AGE: [birth year] 56 y.o.  Admit date: 08/28/2011 Discharge date: 09/01/2011  Admission Diagnoses: Right upper quadrant abdominal pain  Discharge Diagnoses: Urinary tract infection, stitch granuloma, anxiety, hypokalemia treated with oral potassium supplementation,  Active Problems:  * No active hospital problems. *    Discharged Condition: stable  Hospital Course: She had initially presented to the emergency department with increased right upper quadrant abdominal tenderness read this is new and different from her previous pain that she has complained of. Evaluation with a CT of the abdomen and pelvis demonstrated again improved findings on the CT. Did have some mild elevation of her liver function studies. Bilirubin was normal. He was admitted for continued monitoring and evaluation. She was started and IV fluid hydration initial bowel rest. Patient had a suspected stitch granuloma which she is actually scheduled to proceed to the minor procedure room this week to have removed and I opted to attempt to remove this at the bedside. This was successful the patient was quite anxious during the procedure. She continued to have pain symptomatology and this continued to present more and more like a urinary tract etiology. I did start her on oral Levaquin. Less than 24 hours of starting the antibiotic she started to have improvement of her symptomatology. She still complains of some discomfort around the midline incision where the stitch granuloma was removed. However this appears clean and I reassured the patient that this will heal by secondary intention. At this time patient wishes to be discharged I do feel that is appropriate. Patient will be continued on oral pain medication that she has been previously prescribed. We'll continue the antibiotic regimen as an outpatient. Continue local wound care. Home health will be arranged for  continued assistance with the midline incision. Patient is to followup in my office in one week.  Consults: None  Significant Diagnostic Studies: labs: Daily CBC, BMET and radiology: CT scan: Abdomen and pelvis demonstrating improvement from previous CT  Treatments: IV hydration, antibiotics: Levaquin and excision of stitch at the bedside  Discharge Exam: Blood pressure 123/83, pulse 73, temperature 98.1 F (36.7 C), temperature source Oral, resp. rate 16, height 5\' 4"  (1.626 m), weight 59.421 kg (131 lb), SpO2 93.00%. General appearance: cooperative, no distress and Anxious Eyes: Pupils equal round reactive extraocular movements are intact Resp: clear to auscultation bilaterally Cardio: regular rate and rhythm GI: Soft, positive bowel sounds, flat, mild right upper quadrant abdominal tenderness mostly towards the flank. No peritoneal signs. Midline incision has small 1.5 cm incision/opening/wound which is clean with minimal serous sanguinous discharge.  Disposition: 01-Home or Self Care  Discharge Orders    Future Orders Please Complete By Expires   Diet - low sodium heart healthy      Increase activity slowly      Discharge instructions      Comments:   Clean area with soap and water.  Anticipate drainage while open.   Call MD for:  temperature >100.4      Call MD for:  persistant nausea and vomiting      Call MD for:  severe uncontrolled pain      Call MD for:  redness, tenderness, or signs of infection (pain, swelling, redness, odor or green/yellow discharge around incision site)        Medication List  As of 09/01/2011 12:20 PM   TAKE these medications         amLODipine 5 MG tablet  Commonly known as: NORVASC   Take 1 tablet (5 mg total) by mouth daily.      aspirin 325 MG tablet   Take 1 tablet (325 mg total) by mouth daily.      levofloxacin 500 MG tablet   Commonly known as: LEVAQUIN   Take 1 tablet (500 mg total) by mouth daily.      lipase/protease/amylase  12000 UNITS Cpep   Commonly known as: CREON-10/PANCREASE   Take 2 capsules by mouth 3 (three) times daily with meals.      oxyCODONE-acetaminophen 7.5-325 MG per tablet   Commonly known as: PERCOCET   Take 1-2 tablets by mouth every 6 (six) hours as needed. For pain      pregabalin 200 MG capsule   Commonly known as: LYRICA   Take 200 mg by mouth 2 (two) times daily.             SignedFabio Bering 09/01/2011, 12:20 PM

## 2011-09-02 NOTE — Progress Notes (Signed)
Discharge summary sent to payer through [facility]  

## 2011-09-16 ENCOUNTER — Encounter (HOSPITAL_COMMUNITY): Payer: Self-pay | Admitting: *Deleted

## 2011-09-16 ENCOUNTER — Emergency Department (HOSPITAL_COMMUNITY)
Admission: EM | Admit: 2011-09-16 | Discharge: 2011-09-16 | Disposition: A | Discharge status: Home / Self Care | Attending: Emergency Medicine | Admitting: Emergency Medicine

## 2011-09-16 DIAGNOSIS — Z9089 Acquired absence of other organs: Secondary | ICD-10-CM | POA: Insufficient documentation

## 2011-09-16 DIAGNOSIS — R109 Unspecified abdominal pain: Secondary | ICD-10-CM | POA: Insufficient documentation

## 2011-09-16 DIAGNOSIS — I252 Old myocardial infarction: Secondary | ICD-10-CM | POA: Insufficient documentation

## 2011-09-16 DIAGNOSIS — Z87440 Personal history of urinary (tract) infections: Secondary | ICD-10-CM | POA: Insufficient documentation

## 2011-09-16 DIAGNOSIS — Z903 Acquired absence of stomach [part of]: Secondary | ICD-10-CM | POA: Insufficient documentation

## 2011-09-16 DIAGNOSIS — Z91040 Latex allergy status: Secondary | ICD-10-CM | POA: Insufficient documentation

## 2011-09-16 DIAGNOSIS — Z87442 Personal history of urinary calculi: Secondary | ICD-10-CM | POA: Insufficient documentation

## 2011-09-16 DIAGNOSIS — Z885 Allergy status to narcotic agent status: Secondary | ICD-10-CM | POA: Insufficient documentation

## 2011-09-16 DIAGNOSIS — Z90710 Acquired absence of both cervix and uterus: Secondary | ICD-10-CM | POA: Insufficient documentation

## 2011-09-16 DIAGNOSIS — M069 Rheumatoid arthritis, unspecified: Secondary | ICD-10-CM | POA: Insufficient documentation

## 2011-09-16 HISTORY — DX: Calculus of kidney: N20.0

## 2011-09-16 LAB — CBC WITH DIFFERENTIAL/PLATELET
Basophils Absolute: 0 10*3/uL (ref 0.0–0.1)
Basophils Relative: 1 % (ref 0–1)
Eosinophils Absolute: 0.1 10*3/uL (ref 0.0–0.7)
HCT: 34.4 % — ABNORMAL LOW (ref 36.0–46.0)
Hemoglobin: 11.1 g/dL — ABNORMAL LOW (ref 12.0–15.0)
Lymphocytes Relative: 39 % (ref 12–46)
Lymphs Abs: 1.8 10*3/uL (ref 0.7–4.0)
MCH: 26.3 pg (ref 26.0–34.0)
MCHC: 32.3 g/dL (ref 30.0–36.0)
Monocytes Absolute: 0.5 10*3/uL (ref 0.1–1.0)
Neutro Abs: 2.2 10*3/uL (ref 1.7–7.7)
RDW: 21.2 % — ABNORMAL HIGH (ref 11.5–15.5)

## 2011-09-16 LAB — BASIC METABOLIC PANEL
BUN: 6 mg/dL (ref 6–23)
Calcium: 9.1 mg/dL (ref 8.4–10.5)
Creatinine, Ser: 0.55 mg/dL (ref 0.50–1.10)
GFR calc Af Amer: >90 mL/min (ref >90)
GFR calc non Af Amer: >90 mL/min (ref >90)
Glucose, Bld: 102 mg/dL — ABNORMAL HIGH (ref 70–99)

## 2011-09-16 MED ORDER — POTASSIUM CHLORIDE CRYS ER 20 MEQ PO TBCR: 20.0000 meq | EXTENDED_RELEASE_TABLET | Freq: Every day | ORAL | Status: DC

## 2011-09-16 MED ORDER — HYDROMORPHONE HCL PF 1 MG/ML IJ SOLN
1.0000 mg | Freq: Once | INTRAMUSCULAR | Status: AC
  Administered 2011-09-16: 1 mg via INTRAVENOUS
  Filled 2011-09-16: qty 1

## 2011-09-16 MED ORDER — SODIUM CHLORIDE 0.9 % IV SOLN
Freq: Once | INTRAVENOUS | Status: AC
  Administered 2011-09-16: 04:00:00 via INTRAVENOUS

## 2011-09-16 MED ORDER — ONDANSETRON HCL 4 MG/2ML IJ SOLN
4.0000 mg | Freq: Once | INTRAMUSCULAR | Status: AC
  Administered 2011-09-16: 4 mg via INTRAVENOUS
  Filled 2011-09-16: qty 2

## 2011-09-16 NOTE — ED Notes (Signed)
Pt stated she has a HX of kidney stones, pain started yesterday, currently being treated for UTI/blood in urine with Cipro 500mg  BID (prescribed yesterday), also has finished 10 day course of levofloxacin.

## 2011-09-16 NOTE — Discharge Instructions (Signed)
Your bloodwork was normal here today except for a slightly low potassium. I have written a prescription for 7 days of potassium. Followup with your doctor. Continue taking her antibiotics for urinary tract infection. Drink lots of fluids. Once you have finished those antibiotics let your doctor recheck your urine.Use your pain medicine as needed.

## 2011-09-16 NOTE — ED Notes (Signed)
Pt reporting pain in right flank area.  Denies radiation to abdomen.  States pain began yesterday.  Pt has completed one course of antibiotics for UTI, and has started a different course of alternate medication. Reports some chronic nausea.

## 2011-09-16 NOTE — ED Provider Notes (Signed)
History     CSN: [id number]  Arrival date & time 09/16/11  1478   First MD Initiated Contact with Patient 09/16/11 0304      Chief Complaint  Patient presents with  . Flank Pain    (Consider location/radiation/quality/duration/timing/severity/associated sxs/prior treatment) HPI Jordan Haney is a 56 y.o. female with a h/o pancreatitis, recent partial gastrectomy and common bile duct repair, hypokalemia, rheumatoid arthritis, kidney stones, who presents to the Emergency Department complaining of increasing right flank pain that has been present for several days and worsened tonight. Patient is currently under treatment for urinary tract infection having finished one course of antibiotics (Levaquin) and now on Cipro. She denies fever, chills, nausea, vomiting, chest pain, shortness of breath. She is on Percocet at home and it has not helped this pain.  PCP Dr. Henreitta Leber, [facility] Surgeon Leticia Penna   Past Medical History  Diagnosis Date  . RA (rheumatoid arthritis)  2008  . MI (myocardial infarction) 2008    Not well documented, patient reports reassuring cardiac catheterization and stress testing in [place]  . Constipation 03/21/2011  . Hypokalemia 03/21/2011  . Fatty liver 03/21/2011  . Pancreatitis     states 3 years ago, very severe, ?biliary pancreatitis   . T12 compression fracture 2011  . Duodenal ulcer     remote per patient. +BC powders, patient report negative EGD six months ago  . Dilated pancreatic duct     ?chronic pancreatitis, EUS pending (06/02/11)  . Kidney stones     Past Surgical History  Procedure Date  . Cholecystectomy   . Knee surgery   . Appendectomy   . Complete hysterectomy   . Ventral wall hernia   . Esophagogastroduodenoscopy 08/2010    Dr. Samuella Cota, Versed. small hh, mild prepylori gastritis. path unavailable  . Esophagogastroduodenoscopy 04/2010    Dr. Samuella Cota, Versed. small hh, mild distal esophagitis, antral gastritis and duodenitis. Bx mild  chronic gastritis and no H.pylori  . Esophagogastroduodenoscopy 08/2009    Dr. Allena Katz, Versed. Moderate gastritis, moderate duodenitis with nodularity in proximal duodenal bulb, bx chronic gastritis, no helicobacter, mild chronic duodenitis  . Esophagogastroduodenoscopy 02/2007    Dr. Samuella Cota, Versed. antral gastritis and duodenal ulcers. Bx mild chronic gastritis. No bx from duodenal ulcer available.  Gaspar Bidding dilation 04/06/2011    Procedure: SAVORY DILATION;  Surgeon: Arlyce Harman, MD;  Location: [facility] ORS;  Service: Endoscopy;  Laterality: N/A;  16 mm  . Esophagogastroduodenoscopy 04/06/11    Dr. Darrick Penna: 1-2 cm hiatal hernia, mod gastritis, 84mmX3mm linear ulcer in duodenal bulb, stricture 1st part of duodenum, dilated to 12 mm  . Esophagogastroduodenoscopy 05/12/11    partial gastric oulet obstruction secondary to stricture between bulb/2nd portion of duodenum with marked friability and inflammation but no discrete ulcer, dilated stomach. s/p dilation but high risk for restenosis  . Esophagogastroduodenoscopy 06/22/2011    Procedure: ESOPHAGOGASTRODUODENOSCOPY (EGD);  Surgeon: West Bali, MD;  Location: [facility] ENDO SUITE;  Service: Endoscopy;  Laterality: N/A;  . Gastrojejunostomy 06/28/2011    Procedure: GASTROJEJUNOSTOMY;  Surgeon: Fabio Bering, MD;  Location: [facility] ORS;  Service: General;  Laterality: N/A;  Roux-en-y, Choledocalduodenostomy  . Abdominal hysterectomy   . Kidney stone surgery     Family History  Problem Relation Age of Onset  . Colon cancer Neg Hx   . Liver cancer Neg Hx   . Breast cancer Neg Hx   . Inflammatory bowel disease Neg Hx   . Heart attack Mother     History  Substance Use Topics  . Smoking status: Never Smoker   . Smokeless tobacco: Not on file  . Alcohol Use: No    OB History    Grav Para Term Preterm Abortions TAB SAB Ect Mult Living                  Review of Systems  Constitutional: Negative for fever.       10 Systems reviewed and are negative for  acute change except as noted in the HPI.  HENT: Negative for congestion.   Eyes: Negative for discharge and redness.  Respiratory: Negative for cough and shortness of breath.   Cardiovascular: Negative for chest pain.  Gastrointestinal: Negative for vomiting and abdominal pain.  Genitourinary: Positive for flank pain.  Musculoskeletal: Negative for back pain.  Skin: Negative for rash.  Neurological: Negative for syncope, numbness and headaches.  Psychiatric/Behavioral:       No behavior change.    Allergies  Compazine; Promethazine hcl; Vistaril; Gabapentin; and Latex  Home Medications   Current Outpatient Rx  Name Route Sig Dispense Refill  . AMLODIPINE BESYLATE 5 MG PO TABS Oral Take 1 tablet (5 mg total) by mouth daily. 30 tablet 0  . ASPIRIN 325 MG PO TABS Oral Take 1 tablet (325 mg total) by mouth daily. 30 tablet 2  . CIPROFLOXACIN HCL 500 MG PO TABS Oral Take 500 mg by mouth 2 (two) times daily.    Marland Kitchen PANCRELIPASE (LIP-PROT-AMYL) 12000 UNITS PO CPEP Oral Take 2 capsules by mouth 3 (three) times daily with meals. 270 capsule 2  . OXYCODONE-ACETAMINOPHEN 7.5-325 MG PO TABS Oral Take 1-2 tablets by mouth every 6 (six) hours as needed. For pain 45 tablet 0  . PREGABALIN 200 MG PO CAPS Oral Take 200 mg by mouth 2 (two) times daily.       BP 141/95  Pulse 93  Temp 98.1 F (36.7 C) (Oral)  Resp 18  Ht 5\' 4"  (1.626 m)  Wt 125 lb (56.7 kg)  BMI 21.46 kg/m2  SpO2 95%  Physical Exam  Nursing note and vitals reviewed. Constitutional: She appears well-developed and well-nourished.       Awake, alert, nontoxic appearance.  HENT:  Head: Atraumatic.  Eyes: Right eye exhibits no discharge. Left eye exhibits no discharge.  Neck: Normal range of motion. Neck supple.  Cardiovascular: Normal rate, normal heart sounds and intact distal pulses.   Pulmonary/Chest: Effort normal. She exhibits no tenderness.  Abdominal: Soft. Bowel sounds are normal. There is no tenderness. There is no  rebound.  Genitourinary:       Right cva tenderness  Musculoskeletal: She exhibits no tenderness.       Baseline ROM, no obvious new focal weakness.  Neurological:       Mental status and motor strength appears baseline for patient and situation.  Skin: No rash noted.  Psychiatric: She has a normal mood and affect.    ED Course  Procedures (including critical care time)  Results for orders placed during the hospital encounter of 09/16/11  CBC WITH DIFFERENTIAL      Component Value Range   WBC 4.6  4.0 - 10.5 K/uL   RBC 4.22  3.87 - 5.11 MIL/uL   Hemoglobin 11.1 (*) 12.0 - 15.0 g/dL   HCT 47.8 (*) 29.5 - 62.1 %   MCV 81.5  78.0 - 100.0 fL   MCH 26.3  26.0 - 34.0 pg   MCHC 32.3  30.0 - 36.0 g/dL   RDW  21.2 (*) 11.5 - 15.5 %   Platelets 282  150 - 400 K/uL   Neutrophils Relative 48  43 - 77 %   Lymphocytes Relative 39  12 - 46 %   Monocytes Relative 10  3 - 12 %   Eosinophils Relative 2  0 - 5 %   Basophils Relative 1  0 - 1 %   Neutro Abs 2.2  1.7 - 7.7 K/uL   Lymphs Abs 1.8  0.7 - 4.0 K/uL   Monocytes Absolute 0.5  0.1 - 1.0 K/uL   Eosinophils Absolute 0.1  0.0 - 0.7 K/uL   Basophils Absolute 0.0  0.0 - 0.1 K/uL   Smear Review MORPHOLOGY UNREMARKABLE    BASIC METABOLIC PANEL      Component Value Range   Sodium 139  135 - 145 mEq/L   Potassium 3.3 (*) 3.5 - 5.1 mEq/L   Chloride 103  96 - 112 mEq/L   CO2 24  19 - 32 mEq/L   Glucose, Bld 102 (*) 70 - 99 mg/dL   BUN 6  6 - 23 mg/dL   Creatinine, Ser 8.29  0.50 - 1.10 mg/dL   Calcium 9.1  8.4 - 56.2 mg/dL   GFR calc non Af Amer >90  >90 mL/min   GFR calc Af Amer >90  >90 mL/min   0310 Reviewed CT scans done earlier this month. No kidney stones present.  MDM   Patient with history of recurrent abdominal pain and flank pain, pancreatitis, recent partial gastrectomy and common bile duct repair, under treatment for urinary tract infection was here with right flank pain. Labs with a slightly low potassium at 3.3 otherwise  unremarkable. Will not replete potassium while in the emergency room but we'll provide a prescription for potassium. She was given IV fluids, analgesics x2 and anti-medic. Pain has improved. She is taking by mouth fluids. Reviewed the results with both the patient and her husband. Pt feels improved after observation and/or treatment in ED.Pt stable in ED with no significant deterioration in condition.The patient appears reasonably screened and/or stabilized for discharge and I doubt any other medical condition or other [facility] requiring further screening, evaluation, or treatment in the ED at this time prior to discharge.  MDM Reviewed: nursing note, vitals and previous chart Reviewed previous: labs and CT scan Interpretation: labs          Nicoletta Dress. Colon Branch, MD 09/16/11 [phone]

## 2011-09-16 NOTE — ED Notes (Signed)
Pt states that she is unable to urinate at present time.  Will attempt after IV fluid infusion

## 2011-09-19 ENCOUNTER — Encounter (HOSPITAL_COMMUNITY): Payer: Self-pay

## 2011-09-19 ENCOUNTER — Emergency Department (HOSPITAL_COMMUNITY)
Admission: EM | Admit: 2011-09-19 | Discharge: 2011-09-19 | Disposition: A | Discharge status: Home / Self Care | Attending: Emergency Medicine | Admitting: Emergency Medicine

## 2011-09-19 DIAGNOSIS — Z7982 Long term (current) use of aspirin: Secondary | ICD-10-CM | POA: Insufficient documentation

## 2011-09-19 DIAGNOSIS — Z79899 Other long term (current) drug therapy: Secondary | ICD-10-CM | POA: Insufficient documentation

## 2011-09-19 DIAGNOSIS — R197 Diarrhea, unspecified: Secondary | ICD-10-CM | POA: Insufficient documentation

## 2011-09-19 DIAGNOSIS — M069 Rheumatoid arthritis, unspecified: Secondary | ICD-10-CM | POA: Insufficient documentation

## 2011-09-19 DIAGNOSIS — M549 Dorsalgia, unspecified: Secondary | ICD-10-CM | POA: Insufficient documentation

## 2011-09-19 DIAGNOSIS — I252 Old myocardial infarction: Secondary | ICD-10-CM | POA: Insufficient documentation

## 2011-09-19 LAB — CBC WITH DIFFERENTIAL/PLATELET
Basophils Absolute: 0.1 10*3/uL (ref 0.0–0.1)
Basophils Relative: 1 % (ref 0–1)
Eosinophils Absolute: 0.1 10*3/uL (ref 0.0–0.7)
Hemoglobin: 12.2 g/dL (ref 12.0–15.0)
MCH: 26.3 pg (ref 26.0–34.0)
MCHC: 31.7 g/dL (ref 30.0–36.0)
Monocytes Relative: 6 % (ref 3–12)
Neutro Abs: 2 10*3/uL (ref 1.7–7.7)
Neutrophils Relative %: 44 % (ref 43–77)
RDW: 21.5 % — ABNORMAL HIGH (ref 11.5–15.5)

## 2011-09-19 LAB — COMPREHENSIVE METABOLIC PANEL
AST: 70 U/L — ABNORMAL HIGH (ref 0–37)
Albumin: 2.8 g/dL — ABNORMAL LOW (ref 3.5–5.2)
Alkaline Phosphatase: 179 U/L — ABNORMAL HIGH (ref 39–117)
BUN: 5 mg/dL — ABNORMAL LOW (ref 6–23)
Creatinine, Ser: 0.61 mg/dL (ref 0.50–1.10)
Potassium: 3.6 mEq/L (ref 3.5–5.1)
Total Protein: 7.1 g/dL (ref 6.0–8.3)

## 2011-09-19 LAB — URINALYSIS, ROUTINE W REFLEX MICROSCOPIC
Glucose, UA: NEGATIVE mg/dL
Hgb urine dipstick: NEGATIVE
Leukocytes, UA: NEGATIVE
pH: 6 (ref 5.0–8.0)

## 2011-09-19 MED ORDER — SODIUM CHLORIDE 0.9 % IV BOLUS (SEPSIS)
1000.0000 mL | Freq: Once | INTRAVENOUS | Status: AC
  Administered 2011-09-19: 1000 mL via INTRAVENOUS

## 2011-09-19 MED ORDER — OXYCODONE-ACETAMINOPHEN 5-325 MG PO TABS: 1.0000 | ORAL_TABLET | Freq: Four times a day (QID) | ORAL | Status: AC | PRN

## 2011-09-19 MED ORDER — ONDANSETRON HCL 4 MG/2ML IJ SOLN
4.0000 mg | Freq: Once | INTRAMUSCULAR | Status: AC
  Administered 2011-09-19: 4 mg via INTRAVENOUS
  Filled 2011-09-19: qty 2

## 2011-09-19 MED ORDER — HYDROMORPHONE HCL PF 1 MG/ML IJ SOLN
1.0000 mg | Freq: Once | INTRAMUSCULAR | Status: AC
  Administered 2011-09-19: 1 mg via INTRAVENOUS
  Filled 2011-09-19: qty 1

## 2011-09-19 NOTE — ED Notes (Signed)
Pt reports that  She has been sick for months with various complaints, today has right flank pain, bladder pain, and diarrhea, husband reports that she fell yesterday

## 2011-09-19 NOTE — ED Provider Notes (Signed)
History   This chart was scribed for Jordan Lennert, MD by Charolett Bumpers . The patient was seen in room [facility].    CSN: [id number]  Arrival date & time 09/19/11  1546   First MD Initiated Contact with Patient 09/19/11 1604      Chief Complaint  Patient presents with  . Flank Pain  . Cystitis  . Fall  . Diarrhea    (Consider location/radiation/quality/duration/timing/severity/associated sxs/prior treatment) HPI Comments: Jordan Haney is a 56 y.o. female who presents to the Emergency Department complaining of constant, moderate right-sided flank pain with associated suprapubic pain, decreased appetite, emesis and diarrhea for the past 3 weeks. Patient states that she has had 2 dosages of abx since the onset of her symptoms. Patient states that she was started on Cipro 5 days ago with no relief. Patient states that her symptoms are worsening.Patient reports a recent gastrojejunostomy in April. Patient also reports a h/o kidney stones and pancreatitis. Patient reports a h/o appendectomy and cholecystectomy. Patient states that her current pain is not similar to her kidney stones.    PCP: Dr. Henreitta Leber, [facility] Surgeon: Dr. Leticia Penna  Patient is a 56 y.o. female presenting with flank pain. The history is provided by the patient.  Flank Pain This is a recurrent problem. The current episode started more than 1 week ago. The problem occurs constantly. The problem has been gradually worsening. Nothing relieves the symptoms.    Past Medical History  Diagnosis Date  . RA (rheumatoid arthritis)  2008  . MI (myocardial infarction) 2008    Not well documented, patient reports reassuring cardiac catheterization and stress testing in [place]  . Constipation 03/21/2011  . Hypokalemia 03/21/2011  . Fatty liver 03/21/2011  . Pancreatitis     states 3 years ago, very severe, ?biliary pancreatitis   . T12 compression fracture 2011  . Duodenal ulcer     remote per patient. +BC  powders, patient report negative EGD six months ago  . Dilated pancreatic duct     ?chronic pancreatitis, EUS pending (06/02/11)  . Kidney stones     Past Surgical History  Procedure Date  . Cholecystectomy   . Knee surgery   . Appendectomy   . Complete hysterectomy   . Ventral wall hernia   . Esophagogastroduodenoscopy 08/2010    Dr. Samuella Cota, Versed. small hh, mild prepylori gastritis. path unavailable  . Esophagogastroduodenoscopy 04/2010    Dr. Samuella Cota, Versed. small hh, mild distal esophagitis, antral gastritis and duodenitis. Bx mild chronic gastritis and no H.pylori  . Esophagogastroduodenoscopy 08/2009    Dr. Allena Katz, Versed. Moderate gastritis, moderate duodenitis with nodularity in proximal duodenal bulb, bx chronic gastritis, no helicobacter, mild chronic duodenitis  . Esophagogastroduodenoscopy 02/2007    Dr. Samuella Cota, Versed. antral gastritis and duodenal ulcers. Bx mild chronic gastritis. No bx from duodenal ulcer available.  Gaspar Bidding dilation 04/06/2011    Procedure: SAVORY DILATION;  Surgeon: Arlyce Harman, MD;  Location: [facility] ORS;  Service: Endoscopy;  Laterality: N/A;  16 mm  . Esophagogastroduodenoscopy 04/06/11    Dr. Darrick Penna: 1-2 cm hiatal hernia, mod gastritis, 51mmX3mm linear ulcer in duodenal bulb, stricture 1st part of duodenum, dilated to 12 mm  . Esophagogastroduodenoscopy 05/12/11    partial gastric oulet obstruction secondary to stricture between bulb/2nd portion of duodenum with marked friability and inflammation but no discrete ulcer, dilated stomach. s/p dilation but high risk for restenosis  . Esophagogastroduodenoscopy 06/22/2011    Procedure: ESOPHAGOGASTRODUODENOSCOPY (EGD);  Surgeon: West Bali,  MD;  Location: [facility] END[facility] SU[facility];  Service: Endoscopy;  Laterality: N/A;  . Gastrojejunostomy 06/28/2011    Procedure: GASTROJEJUNOSTOMY;  Surgeon: Fabio Bering, MD;  Location: [facility] ORS;  Service: General;  Laterality: N/A;  Roux-en-y, Choledocalduodenostomy  . Abdominal  hysterectomy   . Kidney stone surgery     Family History  Problem Relation Age of Onset  . Colon cancer Neg Hx   . Liver cancer Neg Hx   . Breast cancer Neg Hx   . Inflammatory bowel disease Neg Hx   . Heart attack Mother     History  Substance Use Topics  . Smoking status: Never Smoker   . Smokeless tobacco: Not on file  . Alcohol Use: No    OB History    Grav Para Term Preterm Abortions TAB SAB Ect Mult Living                  Review of Systems  Constitutional: Positive for appetite change.  Genitourinary: Positive for flank pain.  All other systems reviewed and are negative.    Allergies  Compazine; Promethazine hcl; Vistaril; Gabapentin; and Latex  Home Medications   Current Outpatient Rx  Name Route Sig Dispense Refill  . AMLODIPINE BESYLATE 5 MG PO TABS Oral Take 1 tablet (5 mg total) by mouth daily. 30 tablet 0  . ASPIRIN 325 MG PO TABS Oral Take 1 tablet (325 mg total) by mouth daily. 30 tablet 2  . CIPROFLOXACIN HCL 500 MG PO TABS Oral Take 500 mg by mouth 2 (two) times daily.    Marland Kitchen PANCRELIPASE (LIP-PROT-AMYL) 12000 UNITS PO CPEP Oral Take 2 capsules by mouth 3 (three) times daily with meals. 270 capsule 2  . POTASSIUM CHLORIDE CRYS ER 20 MEQ PO TBCR Oral Take 1 tablet (20 mEq total) by mouth daily. 7 tablet 0  . PREGABALIN 200 MG PO CAPS Oral Take 200 mg by mouth 2 (two) times daily.       BP 121/90  Pulse 96  Temp 98.7 F (37.1 C) (Oral)  Resp 18  Ht 5\' 4"  (1.626 m)  Wt 125 lb (56.7 kg)  BMI 21.46 kg/m2  SpO2 96%  Physical Exam  Constitutional: She is oriented to person, place, and time. She appears well-developed.  HENT:  Head: Normocephalic and atraumatic.       Mucous membranes are dry.   Eyes: Conjunctivae and EOM are normal. No scleral icterus.  Neck: Neck supple. No thyromegaly present.  Cardiovascular: Normal rate and regular rhythm.  Exam reveals no gallop and no friction rub.   No murmur heard. Pulmonary/Chest: No stridor. She has  no wheezes. She has no rales. She exhibits no tenderness.  Abdominal: She exhibits no distension. There is tenderness. There is no rebound.       Minimal suprapubic tenderness. Well healed incision. Right-sided flank tenderness.     Musculoskeletal: Normal range of motion. She exhibits no edema.  Lymphadenopathy:    She has no cervical adenopathy.  Neurological: She is oriented to person, place, and time. Coordination normal.  Skin: No rash noted. No erythema.       Bruising to left knee.   Psychiatric: She has a normal mood and affect. Her behavior is normal.    ED Course  Procedures (including critical care time)  DIAGNOSTIC STUDIES: Oxygen Saturation is 96% on room air, adequate by my interpretation.    COORDINATION OF CARE:  1612: Discussed planned course of treatment with the patient, who is agreeable at  this time.  1630: Medication Orders: Hydromorphone (Dilaudid) injection 1 mg-once; Ondansetron (Zofran) injection 4 mg-once; Sodium chloride 0.9% bolus 1,000 mL-once.  1829: Recheck: Discussed lab results and planned d/c with pain medication. Discussed f/u with PCP.   Results for orders placed during the hospital encounter of 09/19/11  CBC WITH DIFFERENTIAL      Component Value Range   WBC 4.6  4.0 - 10.5 K/uL   RBC 4.64  3.87 - 5.11 MIL/uL   Hemoglobin 12.2  12.0 - 15.0 g/dL   HCT 13.0  86.5 - 78.4 %   MCV 83.0  78.0 - 100.0 fL   MCH 26.3  26.0 - 34.0 pg   MCHC 31.7  30.0 - 36.0 g/dL   RDW 69.6 (*) 29.5 - 28.4 %   Platelets 229  150 - 400 K/uL   Neutrophils Relative 44  43 - 77 %   Neutro Abs 2.0  1.7 - 7.7 K/uL   Lymphocytes Relative 46  12 - 46 %   Lymphs Abs 2.1  0.7 - 4.0 K/uL   Monocytes Relative 6  3 - 12 %   Monocytes Absolute 0.3  0.1 - 1.0 K/uL   Eosinophils Relative 3  0 - 5 %   Eosinophils Absolute 0.1  0.0 - 0.7 K/uL   Basophils Relative 1  0 - 1 %   Basophils Absolute 0.1  0.0 - 0.1 K/uL  COMPREHENSIVE METABOLIC PANEL      Component Value Range    Sodium 141  135 - 145 mEq/L   Potassium 3.6  3.5 - 5.1 mEq/L   Chloride 106  96 - 112 mEq/L   CO2 24  19 - 32 mEq/L   Glucose, Bld 92  70 - 99 mg/dL   BUN 5 (*) 6 - 23 mg/dL   Creatinine, Ser 1.32  0.50 - 1.10 mg/dL   Calcium 9.2  8.4 - 44.0 mg/dL   Total Protein 7.1  6.0 - 8.3 g/dL   Albumin 2.8 (*) 3.5 - 5.2 g/dL   AST 70 (*) 0 - 37 U/L   ALT 18  0 - 35 U/L   Alkaline Phosphatase 179 (*) 39 - 117 U/L   Total Bilirubin 0.3  0.3 - 1.2 mg/dL   GFR calc non Af Amer >90  >90 mL/min   GFR calc Af Amer >90  >90 mL/min  URINALYSIS, ROUTINE W REFLEX MICROSCOPIC      Component Value Range   Color, Urine YELLOW  YELLOW   APPearance CLEAR  CLEAR   Specific Gravity, Urine <1.005 (*) 1.005 - 1.030   pH 6.0  5.0 - 8.0   Glucose, UA NEGATIVE  NEGATIVE mg/dL   Hgb urine dipstick NEGATIVE  NEGATIVE   Bilirubin Urine NEGATIVE  NEGATIVE   Ketones, ur NEGATIVE  NEGATIVE mg/dL   Protein, ur NEGATIVE  NEGATIVE mg/dL   Urobilinogen, UA 0.2  0.0 - 1.0 mg/dL   Nitrite NEGATIVE  NEGATIVE   Leukocytes, UA NEGATIVE  NEGATIVE     No results found.   No diagnosis found.    MDM    The chart was scribed for me under my direct supervision.  I personally performed the history, physical, and medical decision making and all procedures in the evaluation of this patient.Jordan Lennert, MD 09/19/11 [phone]

## 2011-09-19 NOTE — Discharge Instructions (Signed)
Follow up with your md next week. °

## 2011-10-08 ENCOUNTER — Encounter (HOSPITAL_COMMUNITY): Payer: Self-pay | Admitting: *Deleted

## 2011-10-08 ENCOUNTER — Emergency Department (HOSPITAL_COMMUNITY)
Admission: EM | Admit: 2011-10-08 | Discharge: 2011-10-08 | Disposition: A | Discharge status: Home / Self Care | Attending: Emergency Medicine | Admitting: Emergency Medicine

## 2011-10-08 DIAGNOSIS — R112 Nausea with vomiting, unspecified: Secondary | ICD-10-CM | POA: Insufficient documentation

## 2011-10-08 DIAGNOSIS — R109 Unspecified abdominal pain: Secondary | ICD-10-CM | POA: Insufficient documentation

## 2011-10-08 DIAGNOSIS — I252 Old myocardial infarction: Secondary | ICD-10-CM | POA: Insufficient documentation

## 2011-10-08 DIAGNOSIS — M069 Rheumatoid arthritis, unspecified: Secondary | ICD-10-CM | POA: Insufficient documentation

## 2011-10-08 LAB — URINALYSIS, ROUTINE W REFLEX MICROSCOPIC
Glucose, UA: NEGATIVE mg/dL
Ketones, ur: NEGATIVE mg/dL
Leukocytes, UA: NEGATIVE
Protein, ur: NEGATIVE mg/dL

## 2011-10-08 LAB — COMPREHENSIVE METABOLIC PANEL
ALT: 11 U/L (ref 0–35)
AST: 36 U/L (ref 0–37)
Alkaline Phosphatase: 163 U/L — ABNORMAL HIGH (ref 39–117)
CO2: 24 mEq/L (ref 19–32)
Chloride: 106 mEq/L (ref 96–112)
GFR calc non Af Amer: >90 mL/min (ref >90)
Sodium: 140 mEq/L (ref 135–145)
Total Bilirubin: 0.2 mg/dL — ABNORMAL LOW (ref 0.3–1.2)

## 2011-10-08 LAB — CBC WITH DIFFERENTIAL/PLATELET
Basophils Relative: 1 % (ref 0–1)
Eosinophils Absolute: 0.1 10*3/uL (ref 0.0–0.7)
MCH: 26.9 pg (ref 26.0–34.0)
MCHC: 32.4 g/dL (ref 30.0–36.0)
Monocytes Relative: 5 % (ref 3–12)
Neutrophils Relative %: 57 % (ref 43–77)
Platelets: 271 10*3/uL (ref 150–400)
RDW: 20.1 % — ABNORMAL HIGH (ref 11.5–15.5)

## 2011-10-08 LAB — LIPASE, BLOOD: Lipase: 16 U/L (ref 11–59)

## 2011-10-08 MED ORDER — METOCLOPRAMIDE HCL 10 MG PO TABS: 10.0000 mg | ORAL_TABLET | Freq: Four times a day (QID) | ORAL | Status: DC | PRN

## 2011-10-08 MED ORDER — HYDROMORPHONE HCL 2 MG PO TABS: 2.0000 mg | ORAL_TABLET | ORAL | Status: AC | PRN

## 2011-10-08 MED ORDER — HYDROMORPHONE HCL PF 1 MG/ML IJ SOLN
1.0000 mg | Freq: Once | INTRAMUSCULAR | Status: AC
  Administered 2011-10-08: 1 mg via INTRAVENOUS
  Filled 2011-10-08: qty 1

## 2011-10-08 MED ORDER — DIPHENHYDRAMINE HCL 50 MG/ML IJ SOLN
25.0000 mg | Freq: Once | INTRAMUSCULAR | Status: DC
Start: 2011-10-08 — End: 2011-10-08

## 2011-10-08 MED ORDER — SODIUM CHLORIDE 0.9 % IV BOLUS (SEPSIS)
1000.0000 mL | Freq: Once | INTRAVENOUS | Status: AC
  Administered 2011-10-08: 1000 mL via INTRAVENOUS

## 2011-10-08 MED ORDER — ONDANSETRON HCL 4 MG/2ML IJ SOLN
4.0000 mg | Freq: Once | INTRAMUSCULAR | Status: AC
  Administered 2011-10-08: 4 mg via INTRAVENOUS
  Filled 2011-10-08: qty 2

## 2011-10-08 MED ORDER — SODIUM CHLORIDE 0.9 % IV SOLN: Freq: Once | INTRAVENOUS | Status: DC

## 2011-10-08 MED ORDER — METOCLOPRAMIDE HCL 10 MG PO TABS
10.0000 mg | ORAL_TABLET | Freq: Once | ORAL | Status: AC
  Administered 2011-10-08: 10 mg via ORAL
  Filled 2011-10-08: qty 1

## 2011-10-08 MED ORDER — METOCLOPRAMIDE HCL 5 MG/ML IJ SOLN: 10.0000 mg | Freq: Once | INTRAMUSCULAR | Status: DC

## 2011-10-08 MED ORDER — HYDROMORPHONE HCL 2 MG PO TABS
2.0000 mg | ORAL_TABLET | Freq: Once | ORAL | Status: AC
  Administered 2011-10-08: 2 mg via ORAL
  Filled 2011-10-08: qty 1

## 2011-10-08 NOTE — ED Provider Notes (Signed)
History     CSN: [id number]  Arrival date & time 10/08/11  1219   First MD Initiated Contact with Patient 10/08/11 1317      Chief Complaint  Patient presents with  . Abdominal Pain    (Consider location/radiation/quality/duration/timing/severity/associated sxs/prior treatment) Patient is a 56 y.o. female presenting with abdominal pain. The history is provided by the patient.  Abdominal Pain The primary symptoms of the illness include abdominal pain.  She complains of three-day history of severe, sharp epigastric pain which radiates to the back. There is associated nausea and vomiting. She has not been able to eat anything because of pain and nausea. Pain is worse when she eats. Nothing makes it better. She rates pain at 10/10. There's been no constipation or diarrhea. She has had similar pain in the past and has been diagnosed with chronic pancreatitis. She also had abdominal surgery about 3 months ago.  Past Medical History  Diagnosis Date  . RA (rheumatoid arthritis)  2008  . MI (myocardial infarction) 2008    Not well documented, patient reports reassuring cardiac catheterization and stress testing in [place]  . Constipation 03/21/2011  . Hypokalemia 03/21/2011  . Fatty liver 03/21/2011  . Pancreatitis     states 3 years ago, very severe, ?biliary pancreatitis   . T12 compression fracture 2011  . Duodenal ulcer     remote per patient. +BC powders, patient report negative EGD six months ago  . Dilated pancreatic duct     ?chronic pancreatitis, EUS pending (06/02/11)  . Kidney stones     Past Surgical History  Procedure Date  . Cholecystectomy   . Knee surgery   . Appendectomy   . Complete hysterectomy   . Ventral wall hernia   . Esophagogastroduodenoscopy 08/2010    Dr. Samuella Cota, Versed. small hh, mild prepylori gastritis. path unavailable  . Esophagogastroduodenoscopy 04/2010    Dr. Samuella Cota, Versed. small hh, mild distal esophagitis, antral gastritis and duodenitis. Bx  mild chronic gastritis and no H.pylori  . Esophagogastroduodenoscopy 08/2009    Dr. Allena Katz, Versed. Moderate gastritis, moderate duodenitis with nodularity in proximal duodenal bulb, bx chronic gastritis, no helicobacter, mild chronic duodenitis  . Esophagogastroduodenoscopy 02/2007    Dr. Samuella Cota, Versed. antral gastritis and duodenal ulcers. Bx mild chronic gastritis. No bx from duodenal ulcer available.  Gaspar Bidding dilation 04/06/2011    Procedure: SAVORY DILATION;  Surgeon: Arlyce Harman, MD;  Location: [facility];  Service: Endoscopy;  Laterality: N/A;  16 mm  . Esophagogastroduodenoscopy 04/06/11    Dr. Darrick Penna: 1-2 cm hiatal hernia, mod gastritis, 63mmX3mm linear ulcer in duodenal bulb, stricture 1st part of duodenum, dilated to 12 mm  . Esophagogastroduodenoscopy 05/12/11    partial gastric oulet obstruction secondary to stricture between bulb/2nd portion of duodenum with marked friability and inflammation but no discrete ulcer, dilated stomach. s/p dilation but high risk for restenosis  . Esophagogastroduodenoscopy 06/22/2011    Procedure: ESOPHAGOGASTRODUODENOSCOPY (EGD);  Surgeon: West Bali, MD;  Location: [facility];  Service: Endoscopy;  Laterality: N/A;  . Gastrojejunostomy 06/28/2011    Procedure: GASTROJEJUNOSTOMY;  Surgeon: Fabio Bering, MD;  Location: [facility];  Service: General;  Laterality: N/A;  Roux-en-y, Choledocalduodenostomy  . Abdominal hysterectomy   . Kidney stone surgery     Family History  Problem Relation Age of Onset  . Colon cancer Neg Hx   . Liver cancer Neg Hx   . Breast cancer Neg Hx   . Inflammatory bowel disease Neg Hx   .  Heart attack Mother     History  Substance Use Topics  . Smoking status: Never Smoker   . Smokeless tobacco: Not on file  . Alcohol Use: No    OB History    Grav Para Term Preterm Abortions TAB SAB Ect Mult Living                  Review of Systems  Gastrointestinal: Positive for abdominal pain.  All other systems reviewed  and are negative.    Allergies  Compazine; Promethazine hcl; Vistaril; Gabapentin; and Latex  Home Medications   Current Outpatient Rx  Name Route Sig Dispense Refill  . ADALIMUMAB 40 MG/0.8ML [place] KIT Subcutaneous Inject 40 mg into the skin every 7 (seven) days.    . AMLODIPINE BESYLATE 5 MG PO TABS Oral Take 1 tablet (5 mg total) by mouth daily. 30 tablet 0  . PANCRELIPASE (LIP-PROT-AMYL) 12000 UNITS PO CPEP Oral Take 1 capsule by mouth 3 (three) times daily with meals.    Marland Kitchen PREGABALIN 100 MG PO CAPS Oral Take 100 mg by mouth 2 (two) times daily.      BP 124/94  Pulse 86  Temp 98.6 F (37 C) (Oral)  Resp 20  Ht 5\' 4"  (1.626 m)  Wt 125 lb (56.7 kg)  BMI 21.46 kg/m2  SpO2 99%  Physical Exam  Nursing note and vitals reviewed.  57 year old female who appears uncomfortable. Vital signs are significant for diastolic hypertension with blood pressure 124/94. Oxygen saturation is 99% which is normal. Head is normocephalic and atraumatic. PERRLA, EOMI. There is no scleral icterus. Oropharynx is clear. Neck is nontender and supple. Back is nontender. Lungs are clear without rales, wheezes, rhonchi. Heart has regular rate and rhythm without murmur. Abdomen is soft, flat, with moderate to severe epigastric tenderness. There is no rebound or guarding. Peristalsis is decreased. There no masses or hepatosplenomegaly. Surgical scars present and healing well. He is in the sinuses or edema. Skin is warm and dry without rash. Neurologic: Mental status is normal, cranial nerves are intact, there are no motor or sensory deficits.  ED Course  Procedures (including critical care time)  Results for orders placed during the hospital encounter of 10/08/11  COMPREHENSIVE METABOLIC PANEL      Component Value Range   Sodium 140  135 - 145 mEq/L   Potassium 3.7  3.5 - 5.1 mEq/L   Chloride 106  96 - 112 mEq/L   CO2 24  19 - 32 mEq/L   Glucose, Bld 93  70 - 99 mg/dL   BUN 7  6 - 23 mg/dL   Creatinine, Ser  9.14  0.50 - 1.10 mg/dL   Calcium 8.9  8.4 - 78.2 mg/dL   Total Protein 6.6  6.0 - 8.3 g/dL   Albumin 2.7 (*) 3.5 - 5.2 g/dL   AST 36  0 - 37 U/L   ALT 11  0 - 35 U/L   Alkaline Phosphatase 163 (*) 39 - 117 U/L   Total Bilirubin 0.2 (*) 0.3 - 1.2 mg/dL   GFR calc non Af Amer >90  >90 mL/min   GFR calc Af Amer >90  >90 mL/min  CBC WITH DIFFERENTIAL      Component Value Range   WBC 3.5 (*) 4.0 - 10.5 K/uL   RBC 4.42  3.87 - 5.11 MIL/uL   Hemoglobin 11.9 (*) 12.0 - 15.0 g/dL   HCT 95.6  21.3 - 08.6 %   MCV 83.0  78.0 -  100.0 fL   MCH 26.9  26.0 - 34.0 pg   MCHC 32.4  30.0 - 36.0 g/dL   RDW 16.1 (*) 09.6 - 04.5 %   Platelets 271  150 - 400 K/uL   Neutrophils Relative 57  43 - 77 %   Neutro Abs 2.0  1.7 - 7.7 K/uL   Lymphocytes Relative 36  12 - 46 %   Lymphs Abs 1.3  0.7 - 4.0 K/uL   Monocytes Relative 5  3 - 12 %   Monocytes Absolute 0.2  0.1 - 1.0 K/uL   Eosinophils Relative 2  0 - 5 %   Eosinophils Absolute 0.1  0.0 - 0.7 K/uL   Basophils Relative 1  0 - 1 %   Basophils Absolute 0.0  0.0 - 0.1 K/uL  LIPASE, BLOOD      Component Value Range   Lipase 16  11 - 59 U/L  URINALYSIS, ROUTINE W REFLEX MICROSCOPIC      Component Value Range   Color, Urine YELLOW  YELLOW   APPearance CLEAR  CLEAR   Specific Gravity, Urine 1.010  1.005 - 1.030   pH 6.0  5.0 - 8.0   Glucose, UA NEGATIVE  NEGATIVE mg/dL   Hgb urine dipstick NEGATIVE  NEGATIVE   Bilirubin Urine NEGATIVE  NEGATIVE   Ketones, ur NEGATIVE  NEGATIVE mg/dL   Protein, ur NEGATIVE  NEGATIVE mg/dL   Urobilinogen, UA 0.2  0.0 - 1.0 mg/dL   Nitrite NEGATIVE  NEGATIVE   Leukocytes, UA NEGATIVE  NEGATIVE     1. Abdominal pain   2. Nausea and vomiting       MDM  Abdominal pain which most likely is to do a pancreatitis. Prior charts are reviewed and she has prior ED visits and hospitalizations for chronic pancreatitis. Of note, her lipase has not been elevated in the past with episodes of pancreatitis-presumably due 2 a  burned-out pancreas being unable to generate lipase.  Laboratory workup is unremarkable and lipase value is actually low. In the past, she has generated lipase as high as the mid 50s. She was given IV fluids, IV hydromorphone, IV ondansetron with. She initially got adequate relief of pain the pain was reasonably controlled after a second dose of hydromorphone. She started complaining of increased nausea which was controlled with metoclopramide. Since pain and nausea with were adequately controlled, it was decided to try to treat her at home. She is sent home with prescription for metoclopramide and hydromorphone. She is to return to the emergency department if she gets inadequate control of symptoms with these.      Dione Booze, MD 10/08/11 [phone]

## 2011-10-08 NOTE — ED Notes (Signed)
Upper abd pain for 2 days, says it is due to pancreatitis,  Vomiting,

## 2011-10-08 NOTE — ED Notes (Signed)
Pt req IV not be restarted. EDP aware and ordered po reglan

## 2011-10-13 ENCOUNTER — Emergency Department (HOSPITAL_COMMUNITY)

## 2011-10-13 ENCOUNTER — Encounter (HOSPITAL_COMMUNITY): Payer: Self-pay | Admitting: *Deleted

## 2011-10-13 ENCOUNTER — Emergency Department (HOSPITAL_COMMUNITY)
Admission: EM | Admit: 2011-10-13 | Discharge: 2011-10-13 | Disposition: A | Discharge status: Home / Self Care | Attending: Internal Medicine | Admitting: Internal Medicine

## 2011-10-13 DIAGNOSIS — E876 Hypokalemia: Secondary | ICD-10-CM

## 2011-10-13 DIAGNOSIS — R1013 Epigastric pain: Secondary | ICD-10-CM

## 2011-10-13 DIAGNOSIS — R1031 Right lower quadrant pain: Secondary | ICD-10-CM | POA: Insufficient documentation

## 2011-10-13 DIAGNOSIS — G8929 Other chronic pain: Secondary | ICD-10-CM | POA: Insufficient documentation

## 2011-10-13 DIAGNOSIS — I252 Old myocardial infarction: Secondary | ICD-10-CM | POA: Insufficient documentation

## 2011-10-13 DIAGNOSIS — IMO0001 Reserved for inherently not codable concepts without codable children: Secondary | ICD-10-CM

## 2011-10-13 DIAGNOSIS — R1012 Left upper quadrant pain: Secondary | ICD-10-CM | POA: Insufficient documentation

## 2011-10-13 DIAGNOSIS — E119 Type 2 diabetes mellitus without complications: Secondary | ICD-10-CM | POA: Insufficient documentation

## 2011-10-13 DIAGNOSIS — Z9089 Acquired absence of other organs: Secondary | ICD-10-CM | POA: Insufficient documentation

## 2011-10-13 DIAGNOSIS — Z79899 Other long term (current) drug therapy: Secondary | ICD-10-CM | POA: Insufficient documentation

## 2011-10-13 DIAGNOSIS — M069 Rheumatoid arthritis, unspecified: Secondary | ICD-10-CM | POA: Insufficient documentation

## 2011-10-13 DIAGNOSIS — K760 Fatty (change of) liver, not elsewhere classified: Secondary | ICD-10-CM

## 2011-10-13 DIAGNOSIS — R945 Abnormal results of liver function studies: Secondary | ICD-10-CM

## 2011-10-13 DIAGNOSIS — D649 Anemia, unspecified: Secondary | ICD-10-CM

## 2011-10-13 DIAGNOSIS — K8689 Other specified diseases of pancreas: Secondary | ICD-10-CM

## 2011-10-13 DIAGNOSIS — R9431 Abnormal electrocardiogram [ECG] [EKG]: Secondary | ICD-10-CM

## 2011-10-13 DIAGNOSIS — Z01818 Encounter for other preprocedural examination: Secondary | ICD-10-CM

## 2011-10-13 DIAGNOSIS — K920 Hematemesis: Secondary | ICD-10-CM

## 2011-10-13 DIAGNOSIS — R634 Abnormal weight loss: Secondary | ICD-10-CM

## 2011-10-13 DIAGNOSIS — K279 Peptic ulcer, site unspecified, unspecified as acute or chronic, without hemorrhage or perforation: Secondary | ICD-10-CM

## 2011-10-13 DIAGNOSIS — R1110 Vomiting, unspecified: Secondary | ICD-10-CM | POA: Insufficient documentation

## 2011-10-13 DIAGNOSIS — K315 Obstruction of duodenum: Secondary | ICD-10-CM

## 2011-10-13 DIAGNOSIS — R109 Unspecified abdominal pain: Secondary | ICD-10-CM

## 2011-10-13 LAB — CBC WITH DIFFERENTIAL/PLATELET
Eosinophils Absolute: 0.1 10*3/uL (ref 0.0–0.7)
Hemoglobin: 12.4 g/dL (ref 12.0–15.0)
Lymphs Abs: 1.5 10*3/uL (ref 0.7–4.0)
MCH: 26.4 pg (ref 26.0–34.0)
MCV: 83 fL (ref 78.0–100.0)
Monocytes Relative: 5 % (ref 3–12)
Neutrophils Relative %: 56 % (ref 43–77)
RBC: 4.7 MIL/uL (ref 3.87–5.11)

## 2011-10-13 LAB — COMPREHENSIVE METABOLIC PANEL
Alkaline Phosphatase: 174 U/L — ABNORMAL HIGH (ref 39–117)
BUN: 5 mg/dL — ABNORMAL LOW (ref 6–23)
GFR calc Af Amer: >90 mL/min (ref >90)
GFR calc non Af Amer: >90 mL/min (ref >90)
Glucose, Bld: 92 mg/dL (ref 70–99)
Potassium: 3.3 mEq/L — ABNORMAL LOW (ref 3.5–5.1)
Total Bilirubin: 0.3 mg/dL (ref 0.3–1.2)
Total Protein: 6.6 g/dL (ref 6.0–8.3)

## 2011-10-13 LAB — CK TOTAL AND CKMB (NOT AT ARMC): Total CK: 40 U/L (ref 7–177)

## 2011-10-13 LAB — URINALYSIS, ROUTINE W REFLEX MICROSCOPIC
Bilirubin Urine: NEGATIVE
Glucose, UA: NEGATIVE mg/dL
Ketones, ur: NEGATIVE mg/dL
Nitrite: NEGATIVE
pH: 7.5 (ref 5.0–8.0)

## 2011-10-13 LAB — LIPASE, BLOOD: Lipase: 20 U/L (ref 11–59)

## 2011-10-13 MED ORDER — ONDANSETRON HCL 4 MG/2ML IJ SOLN
4.0000 mg | Freq: Once | INTRAMUSCULAR | Status: AC
Start: 2011-10-13 — End: 2011-10-13
  Administered 2011-10-13: 4 mg via INTRAVENOUS
  Filled 2011-10-13: qty 2

## 2011-10-13 MED ORDER — HYDROMORPHONE HCL PF 1 MG/ML IJ SOLN
1.0000 mg | INTRAMUSCULAR | Status: DC | PRN
  Administered 2011-10-13 (×2): 1 mg via INTRAVENOUS
  Filled 2011-10-13 (×2): qty 1

## 2011-10-13 MED ORDER — POTASSIUM CHLORIDE CRYS ER 20 MEQ PO TBCR
40.0000 meq | EXTENDED_RELEASE_TABLET | Freq: Once | ORAL | Status: AC
  Administered 2011-10-13: 40 meq via ORAL
  Filled 2011-10-13: qty 2

## 2011-10-13 MED ORDER — HYDROMORPHONE HCL PF 1 MG/ML IJ SOLN
1.0000 mg | Freq: Once | INTRAMUSCULAR | Status: AC
  Administered 2011-10-13: 1 mg via INTRAVENOUS
  Filled 2011-10-13: qty 1

## 2011-10-13 MED ORDER — ASPIRIN 81 MG PO CHEW
324.0000 mg | CHEWABLE_TABLET | Freq: Once | ORAL | Status: AC
  Administered 2011-10-13: 324 mg via ORAL
  Filled 2011-10-13: qty 4

## 2011-10-13 NOTE — ED Notes (Signed)
Walker [phone]- significant other.

## 2011-10-13 NOTE — ED Provider Notes (Signed)
History  This chart was scribed for Jordan Gaskins, MD by Gerlean Ren. This patient was seen in room [facility] and the patient's care was started at 12:59PM.   CSN: [id number]  Arrival date & time 10/13/11  1126   First MD Initiated Contact with Patient 10/13/11 1259      Chief Complaint  Patient presents with  . Abdominal Pain     Patient is a 56 y.o. female presenting with abdominal pain. The history is provided by the patient. No language interpreter was used.  Abdominal Pain The primary symptoms of the illness include abdominal pain, nausea and vomiting. The primary symptoms of the illness do not include fever, shortness of breath, diarrhea or dysuria. The current episode started 2 days ago. The onset of the illness was gradual. The problem has been gradually worsening.  The abdominal pain is located in the LUQ and RUQ. The abdominal pain does not radiate. The abdominal pain is relieved by nothing.  Significant associated medical issues include diabetes. Significant associated medical issues do not include inflammatory bowel disease.    Jordan Haney is a 57 y.o. female who presents to the Emergency Department complaining of 2 days of gradual onset, gradually-worsening, constant upper abdominal pain with associated nausea, non-bloody emesis and diarrhea. The abdominal pain radiates to her upper back. She denies having any modifying factors. Pt reports that this pain has been chronic for 3 years, and has had moderate improvements to symptoms through gastrojejunostomy surgery from Dr. Leticia Penna 3 months ago. She reports that the symptoms have been on-going since the surgery. Pt saw Dr. Leticia Penna one week ago and was told to return to the ED if she had another flare up of symptoms.  She was in ED Friday with same complaint of increased abdominal pain, but has had no improvement in symptoms. Pt denies fever, neck pain, sore throat, visual disturbance, CP, cough, SOB, diarrhea, urinary  symptoms, back pain, HA, weakness, numbness and rash as associated symptoms. She has a h/o RA, MI and kidney stones. She has a h/o cholecystectomy. She denies smoking and alcohol use.  Dr. Leticia Penna is GI surgeon. Dr. Darrick Penna is GI Doctor.  Past Medical History  Diagnosis Date  . RA (rheumatoid arthritis)  2008  . MI (myocardial infarction) 2008    Not well documented, patient reports reassuring cardiac catheterization and stress testing in [place]  . Constipation 03/21/2011  . Hypokalemia 03/21/2011  . Fatty liver 03/21/2011  . Pancreatitis     states 3 years ago, very severe, ?biliary pancreatitis   . T12 compression fracture 2011  . Duodenal ulcer     remote per patient. +BC powders, patient report negative EGD six months ago  . Dilated pancreatic duct     ?chronic pancreatitis, EUS pending (06/02/11)  . Kidney stones     Past Surgical History  Procedure Date  . Cholecystectomy   . Knee surgery   . Appendectomy   . Complete hysterectomy   . Ventral wall hernia   . Esophagogastroduodenoscopy 08/2010    Dr. Samuella Cota, Versed. small hh, mild prepylori gastritis. path unavailable  . Esophagogastroduodenoscopy 04/2010    Dr. Samuella Cota, Versed. small hh, mild distal esophagitis, antral gastritis and duodenitis. Bx mild chronic gastritis and no H.pylori  . Esophagogastroduodenoscopy 08/2009    Dr. Allena Katz, Versed. Moderate gastritis, moderate duodenitis with nodularity in proximal duodenal bulb, bx chronic gastritis, no helicobacter, mild chronic duodenitis  . Esophagogastroduodenoscopy 02/2007    Dr. Samuella Cota, Versed. antral gastritis and duodenal  ulcers. Bx mild chronic gastritis. No bx from duodenal ulcer available.  Gaspar Bidding dilation 04/06/2011    Procedure: SAVORY DILATION;  Surgeon: Arlyce Harman, MD;  Location: [facility];  Service: Endoscopy;  Laterality: N/A;  16 mm  . Esophagogastroduodenoscopy 04/06/11    Dr. Darrick Penna: 1-2 cm hiatal hernia, mod gastritis, 62mmX3mm linear ulcer in duodenal  bulb, stricture 1st part of duodenum, dilated to 12 mm  . Esophagogastroduodenoscopy 05/12/11    partial gastric oulet obstruction secondary to stricture between bulb/2nd portion of duodenum with marked friability and inflammation but no discrete ulcer, dilated stomach. s/p dilation but high risk for restenosis  . Esophagogastroduodenoscopy 06/22/2011    Procedure: ESOPHAGOGASTRODUODENOSCOPY (EGD);  Surgeon: West Bali, MD;  Location: [facility];  Service: Endoscopy;  Laterality: N/A;  . Gastrojejunostomy 06/28/2011    Procedure: GASTROJEJUNOSTOMY;  Surgeon: Fabio Bering, MD;  Location: [facility];  Service: General;  Laterality: N/A;  Roux-en-y, Choledocalduodenostomy  . Abdominal hysterectomy   . Kidney stone surgery     Family History  Problem Relation Age of Onset  . Colon cancer Neg Hx   . Liver cancer Neg Hx   . Breast cancer Neg Hx   . Inflammatory bowel disease Neg Hx   . Heart attack Mother     History  Substance Use Topics  . Smoking status: Never Smoker   . Smokeless tobacco: Not on file  . Alcohol Use: No    Review of Systems  Constitutional: Negative for fever.  Respiratory: Negative for shortness of breath.   Gastrointestinal: Positive for nausea, vomiting and abdominal pain. Negative for diarrhea, blood in stool and rectal pain.  Genitourinary: Negative for dysuria.  Neurological: Negative for headaches.  All other systems reviewed and are negative.    Allergies  Compazine; Promethazine hcl; Vistaril; Gabapentin; and Latex  Home Medications   Current Outpatient Rx  Name Route Sig Dispense Refill  . ADALIMUMAB 40 MG/0.8ML [place] KIT Subcutaneous Inject 40 mg into the skin every 7 (seven) days.    . AMLODIPINE BESYLATE 5 MG PO TABS Oral Take 1 tablet (5 mg total) by mouth daily. 30 tablet 0  . HYDROMORPHONE HCL 2 MG PO TABS Oral Take 1 tablet (2 mg total) by mouth every 4 (four) hours as needed for pain. 15 tablet 0  . PANCRELIPASE (LIP-PROT-AMYL) 12000 UNITS  PO CPEP Oral Take 1 capsule by mouth 3 (three) times daily with meals.    Marland Kitchen METOCLOPRAMIDE HCL 10 MG PO TABS Oral Take 1 tablet (10 mg total) by mouth every 6 (six) hours as needed (nausea). 30 tablet 0  . PREGABALIN 100 MG PO CAPS Oral Take 100 mg by mouth 2 (two) times daily.      Triage vitals: BP 145/108  Pulse 92  Temp 98.1 F (36.7 C) (Oral)  Resp 20  Ht 5\' 4"  (1.626 m)  Wt 120 lb (54.432 kg)  BMI 20.60 kg/m2  SpO2 100%  Physical Exam  Nursing note and vitals reviewed.  CONSTITUTIONAL: Well developed/well nourished HEAD AND FACE: Normocephalic/atraumatic EYES: EOMI/PERRL ENMT: Mucous membranes moist NECK: supple no meningeal signs CV: S1/S2 noted, no murmurs/rubs/gallops noted LUNGS: Lungs are clear to auscultation bilaterally, no apparent distress ABDOMEN: soft, nontender, no rebound or guarding Well-healed scar on midline. NEURO: Pt is awake/alert, moves all extremitiesx4 EXTREMITIES: pulses normal, full ROM SKIN: warm, color normal PSYCH: no abnormalities of mood noted  ED Course  Procedures  DIAGNOSTIC STUDIES: Oxygen Saturation is 100% on room air, normal  by my interpretation.    COORDINATION OF CARE: 1:09PM- Discussed treatment plan including pain medication with pt and pt agreed.  3:22 PM Pt denies active CP/SOB EKG is changed from prior, though initial trop negative She reports h/o "small MI" but unclear what her cardiac status is at this point I have consulted medicine (dr Lendell Caprice) and will recheck troponin and CKMB to evaluate patient for possible admission for abnormal EKG.  She will disposition patient.  In terms of abdominal pain, appears chronic, labs reassuring, can f/u as outpatient defer imaging   Labs Reviewed  URINALYSIS, ROUTINE W REFLEX MICROSCOPIC     MDM  Nursing notes including past medical history and social history reviewed and considered in documentation Previous records reviewed and considered -h/o recurrent abdominal  pain xrays reviewed and considered labs/vitals reviewed and considered     Date: 10/13/2011 1157am  Rate: 83  Rhythm: normal sinus rhythm  QRS Axis: normal  Intervals: normal  ST/T Wave abnormalities: ST depressions inferiorly, ST depressions anteriorly and ST depressions laterally  Conduction Disutrbances:none  Narrative Interpretation:   Old EKG Reviewed: changes noted   Date: 10/13/2011 1316  Rate: 83  Rhythm: normal sinus rhythm  QRS Axis: normal  Intervals: normal  ST/T Wave abnormalities: ST depressions inferiorly, ST depressions anteriorly and ST depressions laterally  Conduction Disutrbances:none  Narrative Interpretation:   Old EKG Reviewed: unchanged from EKG earlier today     I personally performed the services described in this documentation, which was scribed in my presence. The recorded information has been reviewed and considered.      Jordan Gaskins, MD 10/13/11 1525

## 2011-10-13 NOTE — ED Notes (Signed)
Pt requesting pain medication prior to dc

## 2011-10-13 NOTE — ED Notes (Signed)
Pt refusing In and Out Cath.

## 2011-10-13 NOTE — ED Notes (Signed)
Dr. Lendell Caprice in to exam pt.

## 2011-10-13 NOTE — ED Notes (Signed)
Upper abd pain for 2 days. N/V Hx of pancreatitis

## 2011-10-13 NOTE — Consult Note (Signed)
[facility] Medical Consultation  Jordan Haney:[id number] DOB: 1955/11/14 DOA: 10/13/2011 PCP: Anson [place], DO   Requesting physician: Bebe Shaggy Date of consultation: 10/13/2011 Reason for consultation: Abnormal EKG  Impression/Recommendations  1. Nonspecific ST and T-wave abnormalities: While these are slightly more pronounced than previous, no definite ischemic changes. She has had 2 negative troponins and 1 negative CPK and MB. She's had no chest pain. She reports that her pain is her usual chronic epigastric pain. No evidence of acute coronary syndrome. Can be discharged home and needs to followup with her primary care physician. 2.  2. Chronic abdominal pain: Patient has had multiple ER visits, multiple procedures for same. It is unchanged from previous and workup is essentially negative. She admits to me that she ran out of her pain medication. I encouraged her to followup with her primary care physician. She would benefit from pain management.  Chief Complaint: Chronic pancreatitis pain  HPI:  The patient is a 56 year old white female with multiple ER visits and hospitalizations for abdominal pain. She's had multiple procedures done. Her pain today is the same as usual. She reports she ran out of her pain medication. She has had no chest pain. However, in the end EKG was done which showed nonspecific changes. These are slightly more pronounced from previous so the hospitalists were consulted to evaluate for possible admission. The patient has had 2 negative troponins, and has no chest pain.  Review of Systems:  As above. Also nausea vomiting.  Past Medical History  Diagnosis Date  . RA (rheumatoid arthritis)  2008  . MI (myocardial infarction) 2008    Not well documented, patient reports reassuring cardiac catheterization and stress testing in [place]  . Constipation 03/21/2011  . Hypokalemia 03/21/2011  . Fatty liver 03/21/2011  . Pancreatitis     states 3  years ago, very severe, ?biliary pancreatitis   . T12 compression fracture 2011  . Duodenal ulcer     remote per patient. +BC powders, patient report negative EGD six months ago  . Dilated pancreatic duct     ?chronic pancreatitis, EUS pending (06/02/11)  . Kidney stones    Past Surgical History  Procedure Date  . Cholecystectomy   . Knee surgery   . Appendectomy   . Complete hysterectomy   . Ventral wall hernia   . Esophagogastroduodenoscopy 08/2010    Dr. Samuella Cota, Versed. small hh, mild prepylori gastritis. path unavailable  . Esophagogastroduodenoscopy 04/2010    Dr. Samuella Cota, Versed. small hh, mild distal esophagitis, antral gastritis and duodenitis. Bx mild chronic gastritis and no H.pylori  . Esophagogastroduodenoscopy 08/2009    Dr. Allena Katz, Versed. Moderate gastritis, moderate duodenitis with nodularity in proximal duodenal bulb, bx chronic gastritis, no helicobacter, mild chronic duodenitis  . Esophagogastroduodenoscopy 02/2007    Dr. Samuella Cota, Versed. antral gastritis and duodenal ulcers. Bx mild chronic gastritis. No bx from duodenal ulcer available.  Gaspar Bidding dilation 04/06/2011    Procedure: SAVORY DILATION;  Surgeon: Arlyce Harman, MD;  Location: [facility];  Service: Endoscopy;  Laterality: N/A;  16 mm  . Esophagogastroduodenoscopy 04/06/11    Dr. Darrick Penna: 1-2 cm hiatal hernia, mod gastritis, 29mmX3mm linear ulcer in duodenal bulb, stricture 1st part of duodenum, dilated to 12 mm  . Esophagogastroduodenoscopy 05/12/11    partial gastric oulet obstruction secondary to stricture between bulb/2nd portion of duodenum with marked friability and inflammation but no discrete ulcer, dilated stomach. s/p dilation but high risk for restenosis  . Esophagogastroduodenoscopy 06/22/2011  Procedure: ESOPHAGOGASTRODUODENOSCOPY (EGD);  Surgeon: West Bali, MD;  Location: [facility];  Service: Endoscopy;  Laterality: N/A;  . Gastrojejunostomy 06/28/2011    Procedure: GASTROJEJUNOSTOMY;  Surgeon: Fabio Bering, MD;  Location: [facility];  Service: General;  Laterality: N/A;  Roux-en-y, Choledocalduodenostomy  . Abdominal hysterectomy   . Kidney stone surgery    Social History:  reports that she has never smoked. She does not have any smokeless tobacco history on file. She reports that she does not drink alcohol or use illicit drugs.  Allergies  Allergen Reactions  . Compazine Anaphylaxis  . Promethazine Hcl Anaphylaxis  . Vistaril (Hydroxyzine Hcl) Other (See Comments)    Reaction: legs shake  . Gabapentin Other (See Comments)    Reactions: legs shake  . Latex Swelling   Family History  Problem Relation Age of Onset  . Colon cancer Neg Hx   . Liver cancer Neg Hx   . Breast cancer Neg Hx   . Inflammatory bowel disease Neg Hx   . Heart attack Mother     Prior to Admission medications   Medication Sig Start Date End Date Taking? Authorizing Provider  adalimumab (HUMIRA) 40 MG/0.8ML injection Inject 40 mg into the skin every 7 (seven) days.   Yes Historical Provider, MD  amLODipine (NORVASC) 5 MG tablet Take 1 tablet (5 mg total) by mouth daily. 05/20/11 05/19/12 Yes Nimish Normajean Glasgow, MD  HYDROmorphone (DILAUDID) 2 MG tablet Take 1 tablet (2 mg total) by mouth every 4 (four) hours as needed for pain. 10/08/11 10/18/11 Yes Dione Booze, MD  lipase/protease/amylase (CREON-10/PANCREASE) 12000 UNITS CPEP Take 1 capsule by mouth 3 (three) times daily with meals.   Yes Historical Provider, MD  metoCLOPramide (REGLAN) 10 MG tablet Take 1 tablet (10 mg total) by mouth every 6 (six) hours as needed (nausea). 10/08/11 10/18/11 Yes Dione Booze, MD  pregabalin (LYRICA) 100 MG capsule Take 200 mg by mouth 2 (two) times daily.    Yes Historical Provider, MD   Physical Exam: Blood pressure 137/92, pulse 86, temperature 98.1 F (36.7 C), temperature source Oral, resp. rate 20, height 5\' 4"  (1.626 m), weight 54.432 kg (120 lb), SpO2 100.00%. Filed Vitals:   10/13/11 1147 10/13/11 1522 10/13/11 1608  BP:  145/108 133/92 137/92  Pulse: 92 83 86  Temp: 98.1 F (36.7 C)    TempSrc: Oral    Resp: 20 16 20   Height: 5\' 4"  (1.626 m)    Weight: 54.432 kg (120 lb)    SpO2: 100% 98% 100%    BP 137/92  Pulse 86  Temp 98.1 F (36.7 C) (Oral)  Resp 20  Ht 5\' 4"  (1.626 m)  Wt 54.432 kg (120 lb)  BMI 20.60 kg/m2  SpO2 100%  General Appearance:    Alert, cooperative, no distress, appears stated age  Head:    Normocephalic, without obvious abnormality, atraumatic  Eyes:    PERRL, conjunctiva/corneas clear, EOM's intact, fundi    benign, both eyes  Ears:    Normal TM's and external ear canals, both ears  Nose:   Nares normal, septum midline, mucosa normal, no drainage    or sinus tenderness  Throat:   Lips, mucosa, and tongue normal; teeth and gums normal  Neck:   Supple, symmetrical, trachea midline, no adenopathy;    thyroid:  no enlargement/tenderness/nodules; no carotid   bruit or JVD  Back:     Symmetric, no curvature, ROM normal, no CVA tenderness  Lungs:  Clear to auscultation bilaterally, respirations unlabored  Chest Wall:    No tenderness or deformity   Heart:    Regular rate and rhythm, S1 and S2 normal, no murmur, rub   or gallop     Abdomen:     Soft, non-tender when distracted, bowel sounds active all four quadrants,    no masses, no organomegaly  Genitalia:   deferred   Rectal:   deferred  Extremities:   Extremities normal, atraumatic, no cyanosis or edema  Pulses:   2+ and symmetric all extremities  Skin:   Skin color, texture, turgor normal, no rashes or lesions  Lymph nodes:   Cervical, supraclavicular, and axillary nodes normal  Neurologic:   CNII-XII intact, normal strength, sensation and reflexes    throughout    Psychiatric: Flat affect calm and cooperative.  Labs on Admission:  Basic Metabolic Panel:  Lab 10/13/11 9562 10/08/11 1410  NA 145 140  K 3.3* 3.7  CL 106 106  CO2 29 24  GLUCOSE 92 93  BUN 5* 7  CREATININE 0.43* 0.56  CALCIUM 9.4 8.9  MG  -- --  PHOS -- --   Liver Function Tests:  Lab 10/13/11 1314 10/08/11 1410  AST 59* 36  ALT 16 11  ALKPHOS 174* 163*  BILITOT 0.3 0.2*  PROT 6.6 6.6  ALBUMIN 2.9* 2.7*    Lab 10/13/11 1314 10/08/11 1410  LIPASE 20 16  AMYLASE -- --   No results found for this basename: AMMONIA:5 in the last 168 hours CBC:  Lab 10/13/11 1314 10/08/11 1410  WBC 4.1 3.5*  NEUTROABS 2.3 2.0  HGB 12.4 11.9*  HCT 39.0 36.7  MCV 83.0 83.0  PLT 223 271   Cardiac Enzymes:  Lab 10/13/11 1520 10/13/11 1314  CKTOTAL 40 --  CKMB 1.2 --  CKMBINDEX -- --  TROPONINI <0.30 <0.30   BNP: No components found with this basename: POCBNP:5 CBG: No results found for this basename: GLUCAP:5 in the last 168 hours  Radiological Exams on Admission: Dg Chest Port 1 View  10/13/2011  *RADIOLOGY REPORT*  Clinical Data: Epigastric pain, worse today.  PORTABLE CHEST - 1 VIEW  Comparison: 08/28/2011  Findings: Normal heart size with clear lung fields.  Chronic right hemidiaphragm elevation.  Mildly tortuous aorta. No pleural effusion or pneumothorax.  No visible free air.  IMPRESSION: No active infiltrates.  No acute cardiopulmonary disease.  Stable chest.  Original Report Authenticated By: Elsie Stain, M.D.    EKG: Normal sinus rhythm with nonspecific changes   Asia Favata L [facility]  10/13/2011, 4:42 PM

## 2011-10-14 ENCOUNTER — Emergency Department (HOSPITAL_COMMUNITY)

## 2011-10-14 ENCOUNTER — Encounter (HOSPITAL_COMMUNITY): Payer: Self-pay | Admitting: Emergency Medicine

## 2011-10-14 ENCOUNTER — Emergency Department (HOSPITAL_COMMUNITY)
Admission: EM | Admit: 2011-10-14 | Discharge: 2011-10-14 | Disposition: A | Discharge status: Home / Self Care | Attending: Emergency Medicine | Admitting: Emergency Medicine

## 2011-10-14 DIAGNOSIS — M069 Rheumatoid arthritis, unspecified: Secondary | ICD-10-CM | POA: Insufficient documentation

## 2011-10-14 DIAGNOSIS — I252 Old myocardial infarction: Secondary | ICD-10-CM | POA: Insufficient documentation

## 2011-10-14 DIAGNOSIS — G8929 Other chronic pain: Secondary | ICD-10-CM

## 2011-10-14 DIAGNOSIS — R109 Unspecified abdominal pain: Secondary | ICD-10-CM | POA: Insufficient documentation

## 2011-10-14 DIAGNOSIS — Z9089 Acquired absence of other organs: Secondary | ICD-10-CM | POA: Insufficient documentation

## 2011-10-14 LAB — CBC WITH DIFFERENTIAL/PLATELET
Eosinophils Relative: 3 % (ref 0–5)
HCT: 35.3 % — ABNORMAL LOW (ref 36.0–46.0)
Hemoglobin: 11.4 g/dL — ABNORMAL LOW (ref 12.0–15.0)
Lymphocytes Relative: 42 % (ref 12–46)
Lymphs Abs: 1.7 10*3/uL (ref 0.7–4.0)
MCV: 82.1 fL (ref 78.0–100.0)
Monocytes Absolute: 0.3 10*3/uL (ref 0.1–1.0)
RBC: 4.3 MIL/uL (ref 3.87–5.11)
WBC: 4 10*3/uL (ref 4.0–10.5)

## 2011-10-14 LAB — RAPID URINE DRUG SCREEN, HOSP PERFORMED
Opiates: NOT DETECTED
Tetrahydrocannabinol: NOT DETECTED

## 2011-10-14 LAB — URINALYSIS, ROUTINE W REFLEX MICROSCOPIC
Hgb urine dipstick: NEGATIVE
Ketones, ur: NEGATIVE mg/dL
Protein, ur: NEGATIVE mg/dL
Urobilinogen, UA: 0.2 mg/dL (ref 0.0–1.0)

## 2011-10-14 LAB — COMPREHENSIVE METABOLIC PANEL
ALT: 17 U/L (ref 0–35)
CO2: 28 mEq/L (ref 19–32)
Calcium: 9.7 mg/dL (ref 8.4–10.5)
Chloride: 106 mEq/L (ref 96–112)
Creatinine, Ser: 0.49 mg/dL — ABNORMAL LOW (ref 0.50–1.10)
GFR calc Af Amer: >90 mL/min (ref >90)
GFR calc non Af Amer: >90 mL/min (ref >90)
Glucose, Bld: 91 mg/dL (ref 70–99)
Total Bilirubin: 0.2 mg/dL — ABNORMAL LOW (ref 0.3–1.2)

## 2011-10-14 MED ORDER — SODIUM CHLORIDE 0.9 % IV SOLN
1000.0000 mL | Freq: Once | INTRAVENOUS | Status: AC
  Administered 2011-10-14: 1000 mL via INTRAVENOUS

## 2011-10-14 MED ORDER — PANTOPRAZOLE SODIUM 40 MG IV SOLR
40.0000 mg | Freq: Once | INTRAVENOUS | Status: AC
  Administered 2011-10-14: 40 mg via INTRAVENOUS
  Filled 2011-10-14: qty 40

## 2011-10-14 MED ORDER — DIPHENHYDRAMINE HCL 50 MG/ML IJ SOLN
25.0000 mg | Freq: Once | INTRAMUSCULAR | Status: AC
  Administered 2011-10-14: 25 mg via INTRAVENOUS

## 2011-10-14 MED ORDER — SODIUM CHLORIDE 0.9 % IV SOLN: 1000.0000 mL | INTRAVENOUS | Status: DC

## 2011-10-14 MED ORDER — ONDANSETRON 8 MG PO TBDP: 8.0000 mg | ORAL_TABLET | Freq: Three times a day (TID) | ORAL | Status: AC | PRN

## 2011-10-14 MED ORDER — DIPHENHYDRAMINE HCL 50 MG/ML IJ SOLN
50.0000 mg | Freq: Once | INTRAMUSCULAR | Status: DC
  Filled 2011-10-14: qty 1

## 2011-10-14 MED ORDER — METOCLOPRAMIDE HCL 5 MG/ML IJ SOLN
10.0000 mg | Freq: Once | INTRAMUSCULAR | Status: AC
  Administered 2011-10-14: 10 mg via INTRAVENOUS
  Filled 2011-10-14: qty 2

## 2011-10-14 MED ORDER — DROPERIDOL 2.5 MG/ML IJ SOLN
1.2500 mg | Freq: Once | INTRAMUSCULAR | Status: AC
  Administered 2011-10-14: 1.25 mg via INTRAVENOUS
  Filled 2011-10-14: qty 2

## 2011-10-14 MED ORDER — GI COCKTAIL ~~LOC~~
30.0000 mL | Freq: Once | ORAL | Status: AC
  Administered 2011-10-14: 30 mL via ORAL
  Filled 2011-10-14: qty 30

## 2011-10-14 NOTE — ED Provider Notes (Cosign Needed)
History     CSN: [id number]  Arrival date & time 10/14/11  7829   First MD Initiated Contact with Patient 10/14/11 1848      Chief Complaint  Patient presents with  . Abdominal Pain    (Consider location/radiation/quality/duration/timing/severity/associated sxs/prior treatment) HPI Patient relates she has had chronic abdominal pain for the past 3 years. She relates she gets frequents episodes. Patient relates for the past few days she's had epigastric abdominal pain described as pressure and sharp it radiates into her back. She states the pain is worse when she eats. She has nausea and vomiting without diarrhea. She denies any fever. She reports she has flareups about every week.  PCP Dr. Henreitta Leber in [facility]   Past Medical History  Diagnosis Date  . RA (rheumatoid arthritis)  2008  . MI (myocardial infarction) 2008    Not well documented, patient reports reassuring cardiac catheterization and stress testing in [place]  . Constipation 03/21/2011  . Hypokalemia 03/21/2011  . Fatty liver 03/21/2011  . Pancreatitis     states 3 years ago, very severe, ?biliary pancreatitis   . T12 compression fracture 2011  . Duodenal ulcer     remote per patient. +BC powders, patient report negative EGD six months ago  . Dilated pancreatic duct     ?chronic pancreatitis, EUS pending (06/02/11)  . Kidney stones     Past Surgical History  Procedure Date  . Cholecystectomy   . Knee surgery   . Appendectomy   . Complete hysterectomy   . Ventral wall hernia   . Esophagogastroduodenoscopy 08/2010    Dr. Samuella Cota, Versed. small hh, mild prepylori gastritis. path unavailable  . Esophagogastroduodenoscopy 04/2010    Dr. Samuella Cota, Versed. small hh, mild distal esophagitis, antral gastritis and duodenitis. Bx mild chronic gastritis and no H.pylori  . Esophagogastroduodenoscopy 08/2009    Dr. Allena Katz, Versed. Moderate gastritis, moderate duodenitis with nodularity in proximal duodenal bulb, bx  chronic gastritis, no helicobacter, mild chronic duodenitis  . Esophagogastroduodenoscopy 02/2007    Dr. Samuella Cota, Versed. antral gastritis and duodenal ulcers. Bx mild chronic gastritis. No bx from duodenal ulcer available.  Gaspar Bidding dilation 04/06/2011    Procedure: SAVORY DILATION;  Surgeon: Arlyce Harman, MD;  Location: [facility];  Service: Endoscopy;  Laterality: N/A;  16 mm  . Esophagogastroduodenoscopy 04/06/11    Dr. Darrick Penna: 1-2 cm hiatal hernia, mod gastritis, 53mmX3mm linear ulcer in duodenal bulb, stricture 1st part of duodenum, dilated to 12 mm  . Esophagogastroduodenoscopy 05/12/11    partial gastric oulet obstruction secondary to stricture between bulb/2nd portion of duodenum with marked friability and inflammation but no discrete ulcer, dilated stomach. s/p dilation but high risk for restenosis  . Esophagogastroduodenoscopy 06/22/2011    Procedure: ESOPHAGOGASTRODUODENOSCOPY (EGD);  Surgeon: West Bali, MD;  Location: [facility];  Service: Endoscopy;  Laterality: N/A;  . Gastrojejunostomy 06/28/2011    Procedure: GASTROJEJUNOSTOMY;  Surgeon: Fabio Bering, MD;  Location: [facility];  Service: General;  Laterality: N/A;  Roux-en-y, Choledocalduodenostomy  . Abdominal hysterectomy   . Kidney stone surgery     Family History  Problem Relation Age of Onset  . Colon cancer Neg Hx   . Liver cancer Neg Hx   . Breast cancer Neg Hx   . Inflammatory bowel disease Neg Hx   . Heart attack Mother     History  Substance Use Topics  . Smoking status: Never Smoker   . Smokeless tobacco: Not on file  . Alcohol Use:  No  lives with spouse Lives at home  OB History    Grav Para Term Preterm Abortions TAB SAB Ect Mult Living                  Review of Systems  All other systems reviewed and are negative.    Allergies  Compazine; Promethazine hcl; Vistaril; Gabapentin; and Latex  Home Medications   Current Outpatient Rx  Name Route Sig Dispense Refill  . ADALIMUMAB 40 MG/0.8ML  [place] KIT Subcutaneous Inject 40 mg into the skin every 7 (seven) days.    . AMLODIPINE BESYLATE 5 MG PO TABS Oral Take 1 tablet (5 mg total) by mouth daily. 30 tablet 0  . HYDROMORPHONE HCL 2 MG PO TABS Oral Take 1 tablet (2 mg total) by mouth every 4 (four) hours as needed for pain. 15 tablet 0  . PANCRELIPASE (LIP-PROT-AMYL) 12000 UNITS PO CPEP Oral Take 1 capsule by mouth 3 (three) times daily with meals.    Marland Kitchen METOCLOPRAMIDE HCL 10 MG PO TABS Oral Take 1 tablet (10 mg total) by mouth every 6 (six) hours as needed (nausea). 30 tablet 0  . PREGABALIN 100 MG PO CAPS Oral Take 200 mg by mouth 2 (two) times daily.       BP 147/103  Pulse 94  Temp 98.4 F (36.9 C) (Oral)  Resp 20  Ht 5\' 4"  (1.626 m)  Wt 125 lb (56.7 kg)  BMI 21.46 kg/m2  SpO2 100%  Vital signs normal    Physical Exam  Nursing note and vitals reviewed. Constitutional: She is oriented to person, place, and time. She appears well-developed and well-nourished.  Non-toxic appearance. She does not appear ill. No distress.  HENT:  Head: Normocephalic and atraumatic.  Right Ear: External ear normal.  Left Ear: External ear normal.  Nose: Nose normal. No mucosal edema or rhinorrhea.  Mouth/Throat: Oropharynx is clear and moist and mucous membranes are normal. No dental abscesses or uvula swelling.  Eyes: Conjunctivae and EOM are normal. Pupils are equal, round, and reactive to light.  Neck: Normal range of motion and full passive range of motion without pain. Neck supple.  Cardiovascular: Normal rate, regular rhythm and normal heart sounds.  Exam reveals no gallop and no friction rub.   No murmur heard. Pulmonary/Chest: Effort normal and breath sounds normal. No respiratory distress. She has no wheezes. She has no rhonchi. She has no rales. She exhibits no tenderness and no crepitus.  Abdominal: Soft. Normal appearance and bowel sounds are normal. She exhibits no distension. There is tenderness. There is no rebound and no  guarding.  Musculoskeletal: Normal range of motion. She exhibits no edema and no tenderness.       Moves all extremities well.   Neurological: She is alert and oriented to person, place, and time. She has normal strength. No cranial nerve deficit.  Skin: Skin is warm, dry and intact. No rash noted. No erythema. No pallor.  Psychiatric: Her speech is normal and behavior is normal. Her mood appears not anxious.       Flat affect    ED Course  Procedures (including critical care time)   Medications  0.9 %  sodium chloride infusion (1000 mL Intravenous New Bag/Given 10/14/11 2022)    Followed by  0.9 %  sodium chloride infusion (not administered)  ondansetron (ZOFRAN ODT) 8 MG disintegrating tablet (not administered)  droperidol (INAPSINE) injection 1.25 mg (1.25 mg Intravenous Given 10/14/11 2029)  metoCLOPramide (REGLAN) injection 10 mg (  10 mg Intravenous Given 10/14/11 2032)  diphenhydrAMINE (BENADRYL) injection 25 mg (25 mg Intravenous Given 10/14/11 2027)  pantoprazole (PROTONIX) injection 40 mg (40 mg Intravenous Given 10/14/11 2024)  gi cocktail (Maalox,Lidocaine,Donnatal) (30 mL Oral Given 10/14/11 2022)    Patient has been to the ER on June 4, June 27, June 30, July 19, and July 24. She was seen yesterday by Dr. Lendell Caprice who recommended patient go to pain management. When I mentioned it to patient she states she's never been told she needs to go to pain management.  Pt states she is ready to go home    Results for orders placed during the hospital encounter of 10/14/11  CBC WITH DIFFERENTIAL      Component Value Range   WBC 4.0  4.0 - 10.5 K/uL   RBC 4.30  3.87 - 5.11 MIL/uL   Hemoglobin 11.4 (*) 12.0 - 15.0 g/dL   HCT 16.1 (*) 09.6 - 04.5 %   MCV 82.1  78.0 - 100.0 fL   MCH 26.5  26.0 - 34.0 pg   MCHC 32.3  30.0 - 36.0 g/dL   RDW 40.9 (*) 81.1 - 91.4 %   Platelets 204  150 - 400 K/uL   Neutrophils Relative 46  43 - 77 %   Neutro Abs 1.8  1.7 - 7.7 K/uL   Lymphocytes  Relative 42  12 - 46 %   Lymphs Abs 1.7  0.7 - 4.0 K/uL   Monocytes Relative 8  3 - 12 %   Monocytes Absolute 0.3  0.1 - 1.0 K/uL   Eosinophils Relative 3  0 - 5 %   Eosinophils Absolute 0.1  0.0 - 0.7 K/uL   Basophils Relative 1  0 - 1 %   Basophils Absolute 0.0  0.0 - 0.1 K/uL  COMPREHENSIVE METABOLIC PANEL      Component Value Range   Sodium 140  135 - 145 mEq/L   Potassium 3.8  3.5 - 5.1 mEq/L   Chloride 106  96 - 112 mEq/L   CO2 28  19 - 32 mEq/L   Glucose, Bld 91  70 - 99 mg/dL   BUN 4 (*) 6 - 23 mg/dL   Creatinine, Ser 7.82 (*) 0.50 - 1.10 mg/dL   Calcium 9.7  8.4 - 95.6 mg/dL   Total Protein 6.4  6.0 - 8.3 g/dL   Albumin 2.7 (*) 3.5 - 5.2 g/dL   AST 52 (*) 0 - 37 U/L   ALT 17  0 - 35 U/L   Alkaline Phosphatase 168 (*) 39 - 117 U/L   Total Bilirubin 0.2 (*) 0.3 - 1.2 mg/dL   GFR calc non Af Amer >90  >90 mL/min   GFR calc Af Amer >90  >90 mL/min  LIPASE, BLOOD      Component Value Range   Lipase 18  11 - 59 U/L  URINALYSIS, ROUTINE W REFLEX MICROSCOPIC      Component Value Range   Color, Urine YELLOW  YELLOW   APPearance CLEAR  CLEAR   Specific Gravity, Urine <1.005 (*) 1.005 - 1.030   pH 6.5  5.0 - 8.0   Glucose, UA NEGATIVE  NEGATIVE mg/dL   Hgb urine dipstick NEGATIVE  NEGATIVE   Bilirubin Urine NEGATIVE  NEGATIVE   Ketones, ur NEGATIVE  NEGATIVE mg/dL   Protein, ur NEGATIVE  NEGATIVE mg/dL   Urobilinogen, UA 0.2  0.0 - 1.0 mg/dL   Nitrite NEGATIVE  NEGATIVE   Leukocytes, UA NEGATIVE  NEGATIVE  URINE RAPID DRUG SCREEN (HOSP PERFORMED)      Component Value Range   Opiates NONE DETECTED  NONE DETECTED   Cocaine NONE DETECTED  NONE DETECTED   Benzodiazepines NONE DETECTED  NONE DETECTED   Amphetamines NONE DETECTED  NONE DETECTED   Tetrahydrocannabinol NONE DETECTED  NONE DETECTED   Barbiturates NONE DETECTED  NONE DETECTED   Laboratory interpretation all normal except for stable minor elevation of LFTs   Dg Chest Port 1 View  10/13/2011  *RADIOLOGY  REPORT*  Clinical Data: Epigastric pain, worse today.  PORTABLE CHEST - 1 VIEW  Comparison: 08/28/2011  Findings: Normal heart size with clear lung fields.  Chronic right hemidiaphragm elevation.  Mildly tortuous aorta. No pleural effusion or pneumothorax.  No visible free air.  IMPRESSION: No active infiltrates.  No acute cardiopulmonary disease.  Stable chest.  Original Report Authenticated By: Elsie Stain, M.D.     Date: 10/14/2011  Rate: 76  Rhythm: normal sinus rhythm  QRS Axis: normal  Intervals: normal  ST/T Wave abnormalities: normal  Conduction Disutrbances:none  Narrative Interpretation: QTIc 425 ms  Okay to give inapsine  Old EKG Reviewed: none available      1. Chronic abdominal pain    New Prescriptions   ONDANSETRON (ZOFRAN ODT) 8 MG DISINTEGRATING TABLET    Take 1 tablet (8 mg total) by mouth every 8 (eight) hours as needed for nausea.    Plan discharge  Devoria Albe, MD, FACEP    MDM          Ward Givens, MD 10/14/11 [phone]

## 2011-10-14 NOTE — ED Notes (Signed)
Failed IV attempts x 4 by myself and Drinda Butts, RN.

## 2011-10-14 NOTE — ED Notes (Signed)
Epigastric pain for a few days

## 2012-06-22 ENCOUNTER — Ambulatory Visit (INDEPENDENT_AMBULATORY_CARE_PROVIDER_SITE_OTHER): Admitting: Gastroenterology

## 2012-06-22 ENCOUNTER — Encounter (HOSPITAL_COMMUNITY): Payer: Self-pay

## 2012-06-22 ENCOUNTER — Ambulatory Visit (HOSPITAL_COMMUNITY)
Admission: RE | Admit: 2012-06-22 | Discharge: 2012-06-22 | Disposition: A | Discharge status: Home / Self Care | Source: Ambulatory Visit | Attending: Gastroenterology | Admitting: Gastroenterology

## 2012-06-22 ENCOUNTER — Encounter: Payer: Self-pay | Admitting: Gastroenterology

## 2012-06-22 VITALS — BP 139/90 | HR 99 | Temp 97.4°F | Ht 64.0 in | Wt 102.8 lb

## 2012-06-22 DIAGNOSIS — R109 Unspecified abdominal pain: Secondary | ICD-10-CM

## 2012-06-22 DIAGNOSIS — R933 Abnormal findings on diagnostic imaging of other parts of digestive tract: Secondary | ICD-10-CM | POA: Insufficient documentation

## 2012-06-22 DIAGNOSIS — K920 Hematemesis: Secondary | ICD-10-CM

## 2012-06-22 LAB — COMPLETE METABOLIC PANEL WITH GFR
Albumin: 4.2 g/dL (ref 3.5–5.2)
Alkaline Phosphatase: 160 U/L — ABNORMAL HIGH (ref 39–117)
BUN: 5 mg/dL — ABNORMAL LOW (ref 6–23)
Chloride: 95 mEq/L — ABNORMAL LOW (ref 96–112)
Creat: 0.57 mg/dL (ref 0.50–1.10)
GFR, Est African American: >89 mL/min
GFR, Est Non African American: >89 mL/min
Glucose, Bld: 86 mg/dL (ref 70–99)
Potassium: 3.5 mEq/L (ref 3.5–5.3)
Total Protein: 7.7 g/dL (ref 6.0–8.3)

## 2012-06-22 LAB — URINALYSIS, ROUTINE W REFLEX MICROSCOPIC
Bilirubin Urine: NEGATIVE
Glucose, UA: NEGATIVE mg/dL
Hgb urine dipstick: NEGATIVE
Ketones, ur: NEGATIVE mg/dL
Nitrite: NEGATIVE
Specific Gravity, Urine: 1.01 (ref 1.005–1.030)
pH: 5.5 (ref 5.0–8.0)

## 2012-06-22 LAB — CBC WITH DIFFERENTIAL/PLATELET
Eosinophils Absolute: 0.2 10*3/uL (ref 0.0–0.7)
Eosinophils Relative: 3 % (ref 0–5)
Hemoglobin: 9.4 g/dL — ABNORMAL LOW (ref 12.0–15.0)
MCHC: 30.7 g/dL (ref 30.0–36.0)
Monocytes Absolute: 0.4 10*3/uL (ref 0.1–1.0)
Neutro Abs: 2.8 10*3/uL (ref 1.7–7.7)
Neutrophils Relative %: 47 % (ref 43–77)
RDW: 15.4 % (ref 11.5–15.5)
WBC: 5.9 10*3/uL (ref 4.0–10.5)

## 2012-06-22 LAB — LIPASE: Lipase: 18 U/L (ref 11–59)

## 2012-06-22 MED ORDER — PANTOPRAZOLE SODIUM 40 MG PO TBEC: DELAYED_RELEASE_TABLET | ORAL | Status: DC

## 2012-06-22 MED ORDER — CIPROFLOXACIN HCL 500 MG PO TABS: ORAL_TABLET | ORAL | Status: DC

## 2012-06-22 MED ORDER — METRONIDAZOLE 500 MG PO TABS: ORAL_TABLET | ORAL | Status: DC

## 2012-06-22 MED ORDER — ONDANSETRON HCL 4 MG PO TABS: ORAL_TABLET | ORAL | Status: DC

## 2012-06-22 MED ORDER — HYDROCODONE-ACETAMINOPHEN 5-325 MG PO TABS: ORAL_TABLET | ORAL | Status: DC

## 2012-06-22 MED ORDER — IOHEXOL 300 MG/ML  SOLN
100.0000 mL | Freq: Once | INTRAMUSCULAR | Status: AC | PRN
  Administered 2012-06-22: 100 mL via INTRAVENOUS

## 2012-06-22 NOTE — Progress Notes (Addendum)
Subjective:    Patient ID: Jordan Haney, female    DOB: 1955/09/05, 57 y.o.   MRN: [id number]  PCP: Henreitta Leber  HPI SX IN PAIN IN [place] FOR 5 DAYS BUT SHE IGNORED UP HIGH AT THE INCISION. PAIN AND THEN VOMITING. STARTED VOMITING MON: 2, TUES: 3, WED: 0, VOMITED(x1) TODAY-SAW BLOOD MON/TUE/TODAY(SAW STREAKS ON CLOTH. SEVERE PAIN IN ABD IN ABD STARTING AND RADIATING TO HER BACK. FELL AND BROKE HER FOOT. WAS IN HER BOOT, BUT NOW IN SOFT SHOE. NO TAKING ANY ASA, BC/GOODY'S, IBUPROFEN/MOTRIN, ALEVE/NAPROXEN. GOT PHLEBITIS WITH MEGACE. CAN'T GAIN WEIGHT. CAN TOLERATE ICE. T 100.43F MON. NO CHEST PAIN OR SOB. DIDN'T GO TO ED BECAUSE SHE LIVES IN [place]. BMs: LAST STOOL THIS AM #3. CAN'T EAT BECAUSE WHEN SHE EATS SHE HURTS REALLY BAD. STOP TAKING PANCREATIC ENZYMES BECAUSE SHE DIDN'T FEEL SHE NEEDED. SEE MENTAL HEALTH SINCE JAN 2014.  PT DENIES CHILLS,  melena, diarrhea, constipation, problems swallowing, heartburn or indigestion.  Past Medical History  Diagnosis Date  . RA (rheumatoid arthritis)  2008  . MI (myocardial infarction) 2008    Not well documented, patient reports reassuring cardiac catheterization and stress testing in [place]  . Constipation 03/21/2011  . Hypokalemia 03/21/2011  . Fatty liver 03/21/2011  . Pancreatitis     states 3 years ago, very severe, ?biliary pancreatitis   . T12 compression fracture 2011  . Duodenal ulcer     remote per patient. +BC powders, patient report negative EGD six months ago  . Dilated pancreatic duct     ?chronic pancreatitis, EUS pending (06/02/11)  . Kidney stones   MAR 2014: RIGHT FOOT FRACTURE   Past Surgical History  Procedure Laterality Date  . Cholecystectomy    . Knee surgery    . Appendectomy    . Complete hysterectomy    . Ventral wall hernia    . Esophagogastroduodenoscopy  08/2010    Dr. Samuella Cota, Versed. small hh, mild prepylori gastritis. path unavailable  . Esophagogastroduodenoscopy  04/2010    Dr. Samuella Cota, Versed. small hh,  mild distal esophagitis, antral gastritis and duodenitis. Bx mild chronic gastritis and no H.pylori  . Esophagogastroduodenoscopy  08/2009    Dr. Allena Katz, Versed. Moderate gastritis, moderate duodenitis with nodularity in proximal duodenal bulb, bx chronic gastritis, no helicobacter, mild chronic duodenitis  . Esophagogastroduodenoscopy  02/2007    Dr. Samuella Cota, Versed. antral gastritis and duodenal ulcers. Bx mild chronic gastritis. No bx from duodenal ulcer available.  Gaspar Bidding dilation  04/06/2011    Procedure: SAVORY DILATION;  Surgeon: Arlyce Harman, MD;  Location: [facility];  Service: Endoscopy;  Laterality: N/A;  16 mm  . Esophagogastroduodenoscopy  04/06/11    Dr. Darrick Penna: 1-2 cm hiatal hernia, mod gastritis, 46mmX3mm linear ulcer in duodenal bulb, stricture 1st part of duodenum, dilated to 12 mm  . Esophagogastroduodenoscopy  05/12/11    partial gastric oulet obstruction secondary to stricture between bulb/2nd portion of duodenum with marked friability and inflammation but no discrete ulcer, dilated stomach. s/p dilation but high risk for restenosis  . Esophagogastroduodenoscopy  06/22/2011    Procedure: ESOPHAGOGASTRODUODENOSCOPY (EGD);  Surgeon: West Bali, MD;  Location: [facility];  Service: Endoscopy;  Laterality: N/A;  . Gastrojejunostomy  06/28/2011    Procedure: GASTROJEJUNOSTOMY;  Surgeon: Fabio Bering, MD;  Location: [facility];  Service: General;  Laterality: N/A;  Roux-en-y, Choledocalduodenostomy  . Abdominal hysterectomy    . Kidney stone surgery    OCT 2013: KYPHOPLASTY  Allergies  Allergen Reactions  .  Compazine Anaphylaxis  . Promethazine Hcl Anaphylaxis  . Vistaril (Hydroxyzine Hcl) Other (See Comments)    Reaction: legs shake  . Gabapentin Other (See Comments)    Reactions: legs shake  . Latex Swelling   Current Outpatient Prescriptions  Medication Sig Dispense Refill  . adalimumab (HUMIRA) 40 MG/0.8ML injection Inject 40 mg into the skin every 7 (seven) days.      .  ALPRAZolam (XANAX) 0.5 MG tablet Take 0.5 mg by mouth 2 (two) times daily.      . pantoprazole (PROTONIX) 40 MG tablet Take 40 mg by mouth daily.      Marland Kitchen zolpidem (AMBIEN) 5 MG tablet Take 5 mg by mouth at bedtime as needed for sleep.      Marland Kitchen amLODipine (NORVASC) 5 MG tablet Take 1 tablet (5 mg total) by mouth daily.    . lipase/protease/amylase (CREON-10/PANCREASE) 12000 UNITS CPEP Take 1 capsule by mouth 3 (three) times daily with meals.  NOT TAKING    . pregabalin (LYRICA) 100 MG capsule Take 200 mg by mouth 2 (two) times daily.        Family History  Problem Relation Age of Onset  . Colon cancer Neg Hx   . Liver cancer Neg Hx   . Breast cancer Neg Hx   . Inflammatory bowel disease Neg Hx   . Heart attack Mother     History  Substance Use Topics  . Smoking status: Never Smoker   . Smokeless tobacco: Not on file  . Alcohol Use: No   Review of Systems PER HPI OTHERWISE ALL SYSTEMS ARE NEGATIVE.  [facility] MAR 2014: CR 0.49 AST 32H ALT 21 ALK PHOS 128H LIPASE 19 HB 10.1L MCV 73.8L  PLT 427    Objective:   Physical Exam  Vitals reviewed. Constitutional: She is oriented to person, place, and time. No distress.  HENT:  Head: Normocephalic and atraumatic.  Mouth/Throat: Oropharynx is clear and moist. No oropharyngeal exudate.  Eyes: Pupils are equal, round, and reactive to light. No scleral icterus.  Neck: Normal range of motion. Neck supple.  Cardiovascular: Normal rate, regular rhythm and normal heart sounds.   Pulmonary/Chest: Effort normal and breath sounds normal. No respiratory distress.  Abdominal: Soft. Bowel sounds are normal. She exhibits no distension. There is tenderness. There is rebound (MILD). There is no guarding.  SEVERE TTP IN THE PERI-UMBILICAL/PERI-INCISIONAL REGION  Musculoskeletal: She exhibits no edema.  Lymphadenopathy:    She has no cervical adenopathy.  Neurological: She is alert and oriented to person, place, and time.  NO  NEW FOCAL DEFICITS     Psychiatric:  ANXIOUS MOOD, NL AFFECT           Assessment & Plan:

## 2012-06-22 NOTE — Addendum Note (Signed)
Addended by: West Bali on: 06/22/2012 08:55 PM   Modules accepted: Orders

## 2012-06-22 NOTE — Progress Notes (Signed)
Called by radiology.  SPOKE TO PT. EXPLAINED RESULTS-PT HAS COLITIS.CALLED VICODIN TO [facility]. SENT ABX VIA EMR. PT WILL CALL APR 7 IF SX NOT IMPROVED & PICK UP OTHER MEDS IN [place].

## 2012-06-22 NOTE — Patient Instructions (Addendum)
GO TO RADIOLOGY PICK UP CONTRAST TO DRINK.  GO TO LAB AND HAVE LABS DRAWN.  GET CT. WAIT IN CT FOR RESULTS.  CONTINUE PROTONIX. TAKE 30 MINUTES PRIOR TO MEALS TWICE DAILY.  USE ZOFRAN 30 MINS BEFORE MEALS AND AT BEDTIME FOR THE NEXT 5 DAYS.  FOLLOW A FULL LIQUID DIET FOR THE NEXT 3 DAYS. SEE INFO BELOW.  DRINK BOOST OR ENSURE 3 OR 4 CANS A DAY. SIP A CAN DON'T CHUG IT.  UPPER ENDOSCOPY ON APR 8.  FOLLOW UP IN 6 WEEKS.   Full Liquid Diet A high-calorie, high-protein supplement should be used to meet your nutritional requirements when the full liquid diet is continued for more than 2 or 3 days. If this diet is to be used for an extended period of time (more than 7 days), a multivitamin should be considered.  Breads and Starches  Allowed: None are allowed except crackersWHOLE OR pureed (made into a thick, smooth soup) in soup. Cooked, refined corn, oat, rice, rye, and wheat cereals are also allowed.   Avoid: Any others.    Potatoes/Pasta/Rice  Allowed: ANY ITEM AS A SOUP OR SMALL PLATE OF MASHED POTATOES OR RICE.       Vegetables  Allowed: Strained tomato or vegetable juice. Vegetables pureed in soup.   Avoid: Any others.    Fruit  Allowed: Any strained fruit juices and fruit drinks. Include 1 serving of citrus or vitamin C-enriched fruit juice daily.   Avoid: Any others.  Meat and Meat Substitutes  Allowed: Egg  Avoid: Any meat, fish, or fowl. All cheese.  Milk  Allowed: SOY Milk beverages, including milk shakes and instant breakfast mixes. Smooth yogurt.   Avoid: Any others. Avoid dairy products if not tolerated.    Soups and Combination Foods  Allowed: Broth, strained cream soups. Strained, broth-based soups.   Avoid: Any others.    Desserts and Sweets  Allowed: flavored gelatin, tapioca, plain LACTOSE FREE ice cream, sherbet, smooth pudding, junket, fruit ices, frozen ice pops, pudding pops,, frozen fudge pops, chocolate syrup. Sugar, honey,  jelly, syrup.   Avoid: Any others.  Fats and Oils  Allowed: Margarine, butter, cream, sour cream, oils.   Avoid: Any others.  Beverages  Allowed: All.   Avoid: None.  Condiments  Allowed: Iodized salt, pepper, spices, flavorings. Cocoa powder.   Avoid: Any others.    SAMPLE MEAL PLAN Breakfast   cup orange juice.   1 cup cooked wheat cereal.   1 cup SOY milk.   1 cup beverage (coffee or tea).   Cream or sugar, if desired.    Midmorning Snack  2 SCRAMBLED OR HARD BOILED EGG   Lunch  1 cup cream soup.    cup fruit juice.   1 cup SOY milk.    cup custard.   1 cup beverage (coffee or tea).   Cream or sugar, if desired.    Midafternoon Snack  1 cup VANILLA SOY milk shake.  Dinner  1 cup cream soup.    cup fruit juice.   1 cup SOY milk.    cup pudding.   1 cup beverage (coffee or tea).   Cream or sugar, if desired.  Evening Snack  1 cup supplement.  To increase calories, add sugar, cream, butter, or margarine if possible. Nutritional supplements will also increase the total calories.

## 2012-06-22 NOTE — Assessment & Plan Note (Signed)
ACUTE IN ONSET & ASSOCIATED WITH VOMITING-DIFFERENTIAL DIAGNOSIS INCLUDES ACUTE GE, LESS LIKELY PANCREATITIS, PARTIAL SBO,OR INFECTIOUS COLITIS  STAT LABS/CT TODAY PT TO WAIT AT [facility] FOR RESULTS OPV IN 6 WEEKS

## 2012-06-22 NOTE — Assessment & Plan Note (Signed)
MOST LIKELY DUE TO ESOPHAGITIS, LESS LIKELY MW TEAR OR PUD. PT DENIES NSAID USE.  CHECK CBC TODAY BID PPI EGD NEXT WEEK OPV IN 6 WEEKS

## 2012-06-22 NOTE — Progress Notes (Signed)
CC PCP 

## 2012-06-23 ENCOUNTER — Other Ambulatory Visit: Payer: Self-pay | Admitting: Gastroenterology

## 2012-06-23 DIAGNOSIS — K920 Hematemesis: Secondary | ICD-10-CM

## 2012-06-24 ENCOUNTER — Inpatient Hospital Stay (HOSPITAL_COMMUNITY)
Admission: EM | Admit: 2012-06-24 | Discharge: 2012-06-29 | DRG: 346 | Disposition: A | Discharge status: Home / Self Care | Attending: Internal Medicine | Admitting: Internal Medicine

## 2012-06-24 ENCOUNTER — Encounter (HOSPITAL_COMMUNITY): Payer: Self-pay

## 2012-06-24 ENCOUNTER — Telehealth: Payer: Self-pay | Admitting: Internal Medicine

## 2012-06-24 ENCOUNTER — Emergency Department (HOSPITAL_COMMUNITY)

## 2012-06-24 DIAGNOSIS — D509 Iron deficiency anemia, unspecified: Secondary | ICD-10-CM | POA: Diagnosis present

## 2012-06-24 DIAGNOSIS — R627 Adult failure to thrive: Secondary | ICD-10-CM | POA: Diagnosis present

## 2012-06-24 DIAGNOSIS — Z8711 Personal history of peptic ulcer disease: Secondary | ICD-10-CM

## 2012-06-24 DIAGNOSIS — D649 Anemia, unspecified: Secondary | ICD-10-CM | POA: Diagnosis present

## 2012-06-24 DIAGNOSIS — N390 Urinary tract infection, site not specified: Secondary | ICD-10-CM | POA: Diagnosis present

## 2012-06-24 DIAGNOSIS — E860 Dehydration: Secondary | ICD-10-CM | POA: Diagnosis present

## 2012-06-24 DIAGNOSIS — Z91040 Latex allergy status: Secondary | ICD-10-CM

## 2012-06-24 DIAGNOSIS — K284 Chronic or unspecified gastrojejunal ulcer with hemorrhage: Principal | ICD-10-CM | POA: Diagnosis present

## 2012-06-24 DIAGNOSIS — K8689 Other specified diseases of pancreas: Secondary | ICD-10-CM | POA: Diagnosis present

## 2012-06-24 DIAGNOSIS — K760 Fatty (change of) liver, not elsewhere classified: Secondary | ICD-10-CM | POA: Diagnosis present

## 2012-06-24 DIAGNOSIS — I252 Old myocardial infarction: Secondary | ICD-10-CM

## 2012-06-24 DIAGNOSIS — R1031 Right lower quadrant pain: Secondary | ICD-10-CM | POA: Diagnosis not present

## 2012-06-24 DIAGNOSIS — Z8249 Family history of ischemic heart disease and other diseases of the circulatory system: Secondary | ICD-10-CM

## 2012-06-24 DIAGNOSIS — K529 Noninfective gastroenteritis and colitis, unspecified: Secondary | ICD-10-CM | POA: Diagnosis present

## 2012-06-24 DIAGNOSIS — R1310 Dysphagia, unspecified: Secondary | ICD-10-CM | POA: Diagnosis present

## 2012-06-24 DIAGNOSIS — K7689 Other specified diseases of liver: Secondary | ICD-10-CM | POA: Diagnosis present

## 2012-06-24 DIAGNOSIS — M069 Rheumatoid arthritis, unspecified: Secondary | ICD-10-CM | POA: Diagnosis present

## 2012-06-24 DIAGNOSIS — E876 Hypokalemia: Secondary | ICD-10-CM | POA: Diagnosis present

## 2012-06-24 DIAGNOSIS — Z87311 Personal history of (healed) other pathological fracture: Secondary | ICD-10-CM

## 2012-06-24 DIAGNOSIS — Z90710 Acquired absence of both cervix and uterus: Secondary | ICD-10-CM

## 2012-06-24 DIAGNOSIS — K5289 Other specified noninfective gastroenteritis and colitis: Secondary | ICD-10-CM | POA: Diagnosis present

## 2012-06-24 DIAGNOSIS — R1013 Epigastric pain: Secondary | ICD-10-CM | POA: Diagnosis present

## 2012-06-24 DIAGNOSIS — Z87442 Personal history of urinary calculi: Secondary | ICD-10-CM

## 2012-06-24 DIAGNOSIS — Z9089 Acquired absence of other organs: Secondary | ICD-10-CM

## 2012-06-24 DIAGNOSIS — K279 Peptic ulcer, site unspecified, unspecified as acute or chronic, without hemorrhage or perforation: Secondary | ICD-10-CM | POA: Diagnosis present

## 2012-06-24 DIAGNOSIS — R7989 Other specified abnormal findings of blood chemistry: Secondary | ICD-10-CM | POA: Diagnosis present

## 2012-06-24 DIAGNOSIS — E039 Hypothyroidism, unspecified: Secondary | ICD-10-CM | POA: Diagnosis present

## 2012-06-24 DIAGNOSIS — Z79899 Other long term (current) drug therapy: Secondary | ICD-10-CM

## 2012-06-24 DIAGNOSIS — R109 Unspecified abdominal pain: Secondary | ICD-10-CM | POA: Diagnosis present

## 2012-06-24 DIAGNOSIS — IMO0002 Reserved for concepts with insufficient information to code with codable children: Secondary | ICD-10-CM

## 2012-06-24 DIAGNOSIS — R945 Abnormal results of liver function studies: Secondary | ICD-10-CM

## 2012-06-24 DIAGNOSIS — S22000A Wedge compression fracture of unspecified thoracic vertebra, initial encounter for closed fracture: Secondary | ICD-10-CM

## 2012-06-24 DIAGNOSIS — R634 Abnormal weight loss: Secondary | ICD-10-CM | POA: Diagnosis present

## 2012-06-24 DIAGNOSIS — M810 Age-related osteoporosis without current pathological fracture: Secondary | ICD-10-CM | POA: Diagnosis present

## 2012-06-24 DIAGNOSIS — K315 Obstruction of duodenum: Secondary | ICD-10-CM | POA: Diagnosis present

## 2012-06-24 DIAGNOSIS — I10 Essential (primary) hypertension: Secondary | ICD-10-CM | POA: Diagnosis present

## 2012-06-24 DIAGNOSIS — R195 Other fecal abnormalities: Secondary | ICD-10-CM | POA: Diagnosis present

## 2012-06-24 DIAGNOSIS — R112 Nausea with vomiting, unspecified: Secondary | ICD-10-CM

## 2012-06-24 DIAGNOSIS — K5900 Constipation, unspecified: Secondary | ICD-10-CM | POA: Diagnosis present

## 2012-06-24 DIAGNOSIS — E871 Hypo-osmolality and hyponatremia: Secondary | ICD-10-CM

## 2012-06-24 DIAGNOSIS — K920 Hematemesis: Secondary | ICD-10-CM | POA: Diagnosis present

## 2012-06-24 HISTORY — DX: Other chronic pain: G89.29

## 2012-06-24 HISTORY — DX: Unspecified abdominal pain: R10.9

## 2012-06-24 LAB — CBC WITH DIFFERENTIAL/PLATELET
Basophils Relative: 1 % (ref 0–1)
Eosinophils Absolute: 0.3 10*3/uL (ref 0.0–0.7)
Eosinophils Relative: 7 % — ABNORMAL HIGH (ref 0–5)
MCH: 22.7 pg — ABNORMAL LOW (ref 26.0–34.0)
MCHC: 30.2 g/dL (ref 30.0–36.0)
MCV: 75.1 fL — ABNORMAL LOW (ref 78.0–100.0)
Monocytes Relative: 9 % (ref 3–12)
Neutrophils Relative %: 45 % (ref 43–77)
Platelets: 223 10*3/uL (ref 150–400)

## 2012-06-24 LAB — MAGNESIUM: Magnesium: 1.8 mg/dL (ref 1.5–2.5)

## 2012-06-24 LAB — RETICULOCYTES
RBC.: 3.73 MIL/uL — ABNORMAL LOW (ref 3.87–5.11)
Retic Count, Absolute: 29.8 10*3/uL (ref 19.0–186.0)
Retic Ct Pct: 0.8 % (ref 0.4–3.1)

## 2012-06-24 LAB — COMPREHENSIVE METABOLIC PANEL
Albumin: 3.7 g/dL (ref 3.5–5.2)
Alkaline Phosphatase: 146 U/L — ABNORMAL HIGH (ref 39–117)
BUN: 4 mg/dL — ABNORMAL LOW (ref 6–23)
Calcium: 9.1 mg/dL (ref 8.4–10.5)
GFR calc Af Amer: >90 mL/min (ref >90)
Potassium: 3.1 mEq/L — ABNORMAL LOW (ref 3.5–5.1)
Sodium: 131 mEq/L — ABNORMAL LOW (ref 135–145)
Total Protein: 6.5 g/dL (ref 6.0–8.3)

## 2012-06-24 LAB — URINALYSIS, ROUTINE W REFLEX MICROSCOPIC
Glucose, UA: NEGATIVE mg/dL
Hgb urine dipstick: NEGATIVE
Specific Gravity, Urine: <1.005 — ABNORMAL LOW (ref 1.005–1.030)
pH: 5.5 (ref 5.0–8.0)

## 2012-06-24 LAB — LIPASE, BLOOD: Lipase: 17 U/L (ref 11–59)

## 2012-06-24 LAB — URINE MICROSCOPIC-ADD ON

## 2012-06-24 MED ORDER — POTASSIUM CHLORIDE 10 MEQ/100ML IV SOLN
10.0000 meq | INTRAVENOUS | Status: AC
  Administered 2012-06-24: 10 meq via INTRAVENOUS

## 2012-06-24 MED ORDER — METRONIDAZOLE IN NACL 5-0.79 MG/ML-% IV SOLN
500.0000 mg | Freq: Once | INTRAVENOUS | Status: AC
  Administered 2012-06-24: 500 mg via INTRAVENOUS
  Filled 2012-06-24: qty 100

## 2012-06-24 MED ORDER — POTASSIUM CHLORIDE 10 MEQ/100ML IV SOLN
10.0000 meq | Freq: Once | INTRAVENOUS | Status: AC
  Filled 2012-06-24: qty 100

## 2012-06-24 MED ORDER — ONDANSETRON HCL 4 MG/2ML IJ SOLN: 4.0000 mg | Freq: Three times a day (TID) | INTRAMUSCULAR | Status: DC | PRN

## 2012-06-24 MED ORDER — ONDANSETRON HCL 4 MG/2ML IJ SOLN
4.0000 mg | INTRAMUSCULAR | Status: DC | PRN
  Administered 2012-06-24: 4 mg via INTRAVENOUS
  Filled 2012-06-24: qty 2

## 2012-06-24 MED ORDER — LORAZEPAM 2 MG/ML IJ SOLN
0.2500 mg | INTRAMUSCULAR | Status: DC | PRN
  Administered 2012-06-25 – 2012-06-26 (×3): 0.25 mg via INTRAVENOUS
  Filled 2012-06-24 (×4): qty 1

## 2012-06-24 MED ORDER — CIPROFLOXACIN IN D5W 400 MG/200ML IV SOLN
400.0000 mg | Freq: Two times a day (BID) | INTRAVENOUS | Status: DC
Start: 2012-06-25 — End: 2012-06-27
  Administered 2012-06-25 – 2012-06-27 (×5): 400 mg via INTRAVENOUS
  Filled 2012-06-24 (×5): qty 200

## 2012-06-24 MED ORDER — MORPHINE SULFATE 4 MG/ML IJ SOLN: 4.0000 mg | INTRAMUSCULAR | Status: DC | PRN

## 2012-06-24 MED ORDER — CIPROFLOXACIN IN D5W 400 MG/200ML IV SOLN
400.0000 mg | Freq: Two times a day (BID) | INTRAVENOUS | Status: DC
  Filled 2012-06-24 (×4): qty 200

## 2012-06-24 MED ORDER — METRONIDAZOLE IN NACL 5-0.79 MG/ML-% IV SOLN
500.0000 mg | Freq: Three times a day (TID) | INTRAVENOUS | Status: DC
  Filled 2012-06-24 (×6): qty 100

## 2012-06-24 MED ORDER — FAMOTIDINE IN NACL 20-0.9 MG/50ML-% IV SOLN
20.0000 mg | Freq: Once | INTRAVENOUS | Status: AC
  Administered 2012-06-24: 20 mg via INTRAVENOUS
  Filled 2012-06-24: qty 50

## 2012-06-24 MED ORDER — METRONIDAZOLE IN NACL 5-0.79 MG/ML-% IV SOLN
500.0000 mg | Freq: Three times a day (TID) | INTRAVENOUS | Status: DC
Start: 2012-06-25 — End: 2012-06-27
  Administered 2012-06-25 – 2012-06-27 (×7): 500 mg via INTRAVENOUS
  Filled 2012-06-24 (×8): qty 100

## 2012-06-24 MED ORDER — FENTANYL CITRATE 0.05 MG/ML IJ SOLN
25.0000 ug | INTRAMUSCULAR | Status: DC | PRN
  Administered 2012-06-24 – 2012-06-26 (×11): 25 ug via INTRAVENOUS
  Filled 2012-06-24 (×11): qty 2

## 2012-06-24 MED ORDER — PANTOPRAZOLE SODIUM 40 MG IV SOLR
40.0000 mg | Freq: Two times a day (BID) | INTRAVENOUS | Status: DC
  Administered 2012-06-24 – 2012-06-25 (×2): 40 mg via INTRAVENOUS
  Filled 2012-06-24 (×2): qty 40

## 2012-06-24 MED ORDER — MORPHINE SULFATE 4 MG/ML IJ SOLN
4.0000 mg | INTRAMUSCULAR | Status: AC | PRN
  Administered 2012-06-24 (×2): 4 mg via INTRAVENOUS
  Filled 2012-06-24 (×2): qty 1

## 2012-06-24 MED ORDER — PANTOPRAZOLE SODIUM 40 MG IV SOLR
40.0000 mg | Freq: Once | INTRAVENOUS | Status: AC
  Administered 2012-06-24: 40 mg via INTRAVENOUS
  Filled 2012-06-24: qty 40

## 2012-06-24 MED ORDER — SODIUM CHLORIDE 0.9 % IV SOLN
INTRAVENOUS | Status: DC
  Administered 2012-06-24: 19:00:00 via INTRAVENOUS

## 2012-06-24 MED ORDER — CIPROFLOXACIN IN D5W 400 MG/200ML IV SOLN
400.0000 mg | Freq: Once | INTRAVENOUS | Status: AC
  Administered 2012-06-24: 400 mg via INTRAVENOUS
  Filled 2012-06-24: qty 200

## 2012-06-24 MED ORDER — DEXTROSE-NACL 5-0.9 % IV SOLN
INTRAVENOUS | Status: DC
  Administered 2012-06-24: 23:00:00 via INTRAVENOUS
  Administered 2012-06-25: 1 mL via INTRAVENOUS
  Administered 2012-06-26 (×2): via INTRAVENOUS

## 2012-06-24 MED ORDER — METOCLOPRAMIDE HCL 5 MG/ML IJ SOLN
10.0000 mg | Freq: Three times a day (TID) | INTRAMUSCULAR | Status: DC | PRN
  Administered 2012-06-24: 10 mg via INTRAVENOUS
  Filled 2012-06-24: qty 2

## 2012-06-24 MED ORDER — HYOSCYAMINE SULFATE 0.125 MG SL SUBL
0.1250 mg | SUBLINGUAL_TABLET | SUBLINGUAL | Status: DC | PRN
  Administered 2012-06-27: 0.125 mg via SUBLINGUAL
  Filled 2012-06-24: qty 1

## 2012-06-24 NOTE — ED Provider Notes (Signed)
History     CSN: [id number]  Arrival date & time 06/24/12  1811   First MD Initiated Contact with Patient 06/24/12 1821      Chief Complaint  Patient presents with  . Abdominal Pain     HPI Pt was seen at 1835.  Per pt, c/o gradual onset and persistence of multiple intermittent episodes of N/V that began 1 week ago. Describes the emesis as "having streaks of blood in it."  Has been associated with an acute flair of her chronic upper abd "pain."  States she was eval by her GI MD Fields 2 days ago for same, had a CT performed, dx with colitis, tx cipro and flagyl. States Dr. Darrick Penna "wanted to admit me to the hospital then but I said no."  States she continues to have abd pain and multiple daily episodes of N/V; and now has started to have diarrhea and subjective home fevers.  Describes the diarrhea as "white" and "loose."  States she is here "to be admitted" today.  Denies CP/SOB, no back pain, no black or blood in stools.     Past Medical History  Diagnosis Date  . RA (rheumatoid arthritis)  2008  . MI (myocardial infarction) 2008    Not well documented, patient reports reassuring cardiac catheterization and stress testing in [place]  . Constipation 03/21/2011  . Hypokalemia 03/21/2011  . Fatty liver 03/21/2011  . Pancreatitis     states 3 years ago, very severe, ?biliary pancreatitis   . T12 compression fracture 2011  . Duodenal ulcer     remote per patient. +BC powders, patient report negative EGD six months ago  . Dilated pancreatic duct     ?chronic pancreatitis, EUS pending (06/02/11)  . Kidney stones   . Chronic abdominal pain     Past Surgical History  Procedure Laterality Date  . Cholecystectomy    . Knee surgery    . Appendectomy    . Complete hysterectomy    . Ventral wall hernia    . Esophagogastroduodenoscopy  08/2010    Dr. Samuella Cota, Versed. small hh, mild prepylori gastritis. path unavailable  . Esophagogastroduodenoscopy  04/2010    Dr. Samuella Cota, Versed. small  hh, mild distal esophagitis, antral gastritis and duodenitis. Bx mild chronic gastritis and no H.pylori  . Esophagogastroduodenoscopy  08/2009    Dr. Allena Katz, Versed. Moderate gastritis, moderate duodenitis with nodularity in proximal duodenal bulb, bx chronic gastritis, no helicobacter, mild chronic duodenitis  . Esophagogastroduodenoscopy  02/2007    Dr. Samuella Cota, Versed. antral gastritis and duodenal ulcers. Bx mild chronic gastritis. No bx from duodenal ulcer available.  Gaspar Bidding dilation  04/06/2011    Procedure: SAVORY DILATION;  Surgeon: Arlyce Harman, MD;  Location: [facility];  Service: Endoscopy;  Laterality: N/A;  16 mm  . Esophagogastroduodenoscopy  04/06/11    Dr. Darrick Penna: 1-2 cm hiatal hernia, mod gastritis, 17mmX3mm linear ulcer in duodenal bulb, stricture 1st part of duodenum, dilated to 12 mm  . Esophagogastroduodenoscopy  05/12/11    partial gastric oulet obstruction secondary to stricture between bulb/2nd portion of duodenum with marked friability and inflammation but no discrete ulcer, dilated stomach. s/p dilation but high risk for restenosis  . Esophagogastroduodenoscopy  06/22/2011    Procedure: ESOPHAGOGASTRODUODENOSCOPY (EGD);  Surgeon: West Bali, MD;  [facility];  Service: Endoscopy;  Laterality: N/A;  . Gastrojejunostomy  06/28/2011    Procedure: GASTROJEJUNOSTOMY;  Surgeon: Fabio Bering, MD;  Location: [facility];  Service: General;  Laterality:  N/A;  Roux-en-y, Choledocalduodenostomy  . Abdominal hysterectomy    . Kidney stone surgery      Family History  Problem Relation Age of Onset  . Colon cancer Neg Hx   . Liver cancer Neg Hx   . Breast cancer Neg Hx   . Inflammatory bowel disease Neg Hx   . Heart attack Mother     History  Substance Use Topics  . Smoking status: Never Smoker   . Smokeless tobacco: Not on file  . Alcohol Use: No    Review of Systems ROS: Statement: All systems negative except as marked or noted in the HPI; Constitutional: +chills,  subjective home fevers. ; ; Eyes: Negative for eye pain, redness and discharge. ; ; ENMT: Negative for ear pain, hoarseness, nasal congestion, sinus pressure and sore throat. ; ; Cardiovascular: Negative for chest pain, palpitations, diaphoresis, dyspnea and peripheral edema. ; ; Respiratory: Negative for cough, wheezing and stridor. ; ; Gastrointestinal: +N/V/D, abd pain, streaks of blood in emesis. Negative for blood in stool, jaundice and rectal bleeding. . ; ; Genitourinary: Negative for dysuria, flank pain and hematuria. ; ; Musculoskeletal: Negative for back pain and neck pain. Negative for swelling and trauma.; ; Skin: Negative for pruritus, rash, abrasions, blisters, bruising and skin lesion.; ; Neuro: Negative for headache, lightheadedness and neck stiffness. Negative for weakness, altered level of consciousness , altered mental status, extremity weakness, paresthesias, involuntary movement, seizure and syncope.       Allergies  Compazine; Promethazine hcl; Vistaril; Gabapentin; and Latex  Home Medications   Current Outpatient Rx  Name  Route  Sig  Dispense  Refill  . adalimumab (HUMIRA) 40 MG/0.8ML injection   Subcutaneous   Inject 40 mg into the skin every 7 (seven) days.         . ALPRAZolam (XANAX) 0.5 MG tablet   Oral   Take 0.5 mg by mouth 2 (two) times daily.         Marland Kitchen EXPIRED: amLODipine (NORVASC) 5 MG tablet   Oral   Take 1 tablet (5 mg total) by mouth daily.   30 tablet   0   . ciprofloxacin (CIPRO) 500 MG tablet      1 PO BID FOR 7 DAYS   14 tablet   0   . HYDROcodone-acetaminophen (NORCO/VICODIN) 5-325 MG per tablet      1 OR 2 EVERY 4-6 H PRN FOR PAIN   30 tablet   0   . lipase/protease/amylase (CREON-10/PANCREASE) 12000 UNITS CPEP   Oral   Take 1 capsule by mouth 3 (three) times daily with meals.         . metroNIDAZOLE (FLAGYL) 500 MG tablet      1 po tid for 10 days   30 tablet   0   . ondansetron (ZOFRAN) 4 MG tablet      1 PO 30 MINS  PRIOR TO MEALS AND AT BEDTIME   42 tablet   0   . pantoprazole (PROTONIX) 40 MG tablet      1 PO 30 MINS PRIOR TO MEALS BID   62 tablet   11   . pregabalin (LYRICA) 100 MG capsule   Oral   Take 200 mg by mouth 2 (two) times daily.          Marland Kitchen zolpidem (AMBIEN) 5 MG tablet   Oral   Take 5 mg by mouth at bedtime as needed for sleep.  BP 117/90  Pulse 78  Temp(Src) 98.3 F (36.8 C) (Oral)  Resp 20  Ht 5\' 4"  (1.626 m)  Wt 105 lb (47.628 kg)  BMI 18.01 kg/m2  SpO2 100%  Physical Exam 1840: Physical examination:  Nursing notes reviewed; Vital signs and O2 SAT reviewed;  Constitutional: Well developed, Well nourished, Well hydrated, Uncomfortable appearing; Head:  Normocephalic, atraumatic; Eyes: EOMI, PERRL, No scleral icterus; ENMT: Mouth and pharynx normal, Mucous membranes moist; Neck: Supple, Full range of motion, No lymphadenopathy; Cardiovascular: Regular rate and rhythm, No murmur, rub, or gallop; Respiratory: Breath sounds clear & equal bilaterally, No rales, rhonchi, wheezes.  Speaking full sentences with ease, Normal respiratory effort/excursion; Chest: Nontender, Movement normal; Abdomen: Soft, +RUQ, mid-epigastric, LUQ tenderness to palp. Nondistended, Normal bowel sounds. Rectal exam performed w/permission of pt and ED RN chaperone present.  Anal tone normal.  Non-tender, soft brown stool in rectal vault, heme neg.  No fissures, no external hemorrhoids, no palp masses.; Genitourinary: No CVA tenderness; Extremities: Pulses normal, No tenderness, No edema, No calf edema or asymmetry.; Neuro: AA&Ox3, Major CN grossly intact.  Speech clear. No gross focal motor or sensory deficits in extremities.; Skin: Color normal, Warm, Dry.   ED Course  Procedures     MDM  MDM Reviewed: previous chart, nursing note and vitals Reviewed previous: labs and CT scan Interpretation: labs and x-ray   Ct Abdomen Pelvis W Contrast 06/22/2012  *RADIOLOGY REPORT*  Clinical Data:  Abdominal pain  CT ABDOMEN AND PELVIS WITH CONTRAST  Technique:  Multidetector CT imaging of the abdomen and pelvis was performed following the standard protocol during bolus administration of intravenous contrast.  Contrast: OMNIPAQUE IOHEXOL 300 MG/ML  SOLN  Comparison: 08/29/2011  Findings: Diffuse hepatic steatosis.  Postoperative changes from gastrojejunostomy are again noted.  Soft tissue adjacent to the anastomosis has improved.  Ring enhancing abnormality anterior to the left lobe of the liver has also resolved.  Spleen, adrenal glands, are within normal limits.  The biliary dilatation has improved.  Pancreatic duct is stable in caliber. Simple cyst in the left kidney.  Otherwise kidneys are within normal limits.  No disproportionate dilatation of bowel to suggest obstruction.  No extraluminal bowel gas.  The transverse colon is decompressed however the wall is ill- defined and hazy.  This process continues into the descending colon and sigmoid colon.  No free fluid.  No obvious abnormal adenopathy.  Bladder is within normal limits.  The appendix is not clearly defined.  Stable T12 compression fracture with retropulsion of the superior endplate.  IMPRESSION: Wall thickening and inflammatory change of the transverse colon, descending colon, and sigmoid colon is suspected.  Differential diagnosis includes inflammatory process such as pseudomembranous colitis or infection.  Also consider inflammatory bowel disease and ischemia.  Improved postoperative changes as described.   Original Report Authenticated By: Jolaine Click, M.D.     Results for orders placed during the hospital encounter of 06/24/12  URINALYSIS, ROUTINE W REFLEX MICROSCOPIC      Result Value Range   Color, Urine YELLOW  YELLOW   APPearance CLEAR  CLEAR   Specific Gravity, Urine <1.005 (*) 1.005 - 1.030   pH 5.5  5.0 - 8.0   Glucose, UA NEGATIVE  NEGATIVE mg/dL   Hgb urine dipstick NEGATIVE  NEGATIVE   Bilirubin Urine NEGATIVE   NEGATIVE   Ketones, ur NEGATIVE  NEGATIVE mg/dL   Protein, ur NEGATIVE  NEGATIVE mg/dL   Urobilinogen, UA 0.2  0.0 - 1.0 mg/dL  Nitrite POSITIVE (*) NEGATIVE   Leukocytes, UA TRACE (*) NEGATIVE  CBC WITH DIFFERENTIAL      Result Value Range   WBC 4.6  4.0 - 10.5 K/uL   RBC 3.74 (*) 3.87 - 5.11 MIL/uL   Hemoglobin 8.5 (*) 12.0 - 15.0 g/dL   HCT 16.1 (*) 09.6 - 04.5 %   MCV 75.1 (*) 78.0 - 100.0 fL   MCH 22.7 (*) 26.0 - 34.0 pg   MCHC 30.2  30.0 - 36.0 g/dL   RDW 40.9  81.1 - 91.4 %   Platelets 223  150 - 400 K/uL   Neutrophils Relative 45  43 - 77 %   Neutro Abs 2.1  1.7 - 7.7 K/uL   Lymphocytes Relative 39  12 - 46 %   Lymphs Abs 1.8  0.7 - 4.0 K/uL   Monocytes Relative 9  3 - 12 %   Monocytes Absolute 0.4  0.1 - 1.0 K/uL   Eosinophils Relative 7 (*) 0 - 5 %   Eosinophils Absolute 0.3  0.0 - 0.7 K/uL   Basophils Relative 1  0 - 1 %   Basophils Absolute 0.0  0.0 - 0.1 K/uL  COMPREHENSIVE METABOLIC PANEL      Result Value Range   Sodium 131 (*) 135 - 145 mEq/L   Potassium 3.1 (*) 3.5 - 5.1 mEq/L   Chloride 93 (*) 96 - 112 mEq/L   CO2 26  19 - 32 mEq/L   Glucose, Bld 95  70 - 99 mg/dL   BUN 4 (*) 6 - 23 mg/dL   Creatinine, Ser 7.82  0.50 - 1.10 mg/dL   Calcium 9.1  8.4 - 95.6 mg/dL   Total Protein 6.5  6.0 - 8.3 g/dL   Albumin 3.7  3.5 - 5.2 g/dL   AST 25  0 - 37 U/L   ALT 10  0 - 35 U/L   Alkaline Phosphatase 146 (*) 39 - 117 U/L   Total Bilirubin 0.2 (*) 0.3 - 1.2 mg/dL   GFR calc non Af Amer >90  >90 mL/min   GFR calc Af Amer >90  >90 mL/min  LIPASE, BLOOD      Result Value Range   Lipase 17  11 - 59 U/L  LACTIC ACID, PLASMA      Result Value Range   Lactic Acid, Venous 1.5  0.5 - 2.2 mmol/L  URINE MICROSCOPIC-ADD ON      Result Value Range   Squamous Epithelial / LPF FEW (*) RARE   WBC, UA 7-10  <3 WBC/hpf   Bacteria, UA RARE  RARE    Dg Abd Acute W/chest 06/24/2012  *RADIOLOGY REPORT*  Clinical Data: Increased abdominal pain since Thursday.  Passing blood  since Monday.  History of surgery 11 months ago to remove portion of the stomach and small bowel.  ACUTE ABDOMEN SERIES (ABDOMEN 2 VIEW & CHEST 1 VIEW)  Comparison: 10/14/2011  Findings: Heart size is normal.  The lungs are free of focal consolidations and pleural effusions.  The aorta is tortuous.  Surgical clips are identified in the right mid abdomen.  Bowel gas pattern is nonobstructive. Contrast is present in the colon following recent CT exam.  Colonic loops do not appear distended. No free intraperitoneal air beneath diaphragm.  Patient has had previous mid thoracic vertebral plasty.  IMPRESSION:  1. No evidence for acute cardiopulmonary abnormality. 2.  Nonobstructive bowel gas pattern.   Original Report Authenticated By: Norva Pavlov, M.D.  Results for AYESHIA, COPPIN (MRN [id number]) as of 06/24/2012 21:12  Ref. Range 10/08/2011 14:10 10/13/2011 13:14 10/14/2011 19:56 06/22/2012 15:48 06/24/2012 18:30  Hemoglobin Latest Range: 12.0-15.0 g/dL 78.2 (L) 95.6 21.3 (L) 9.4 (L) 8.5 (L)  HCT Latest Range: 36.0-46.0 % 36.7 39.0 35.3 (L) 30.6 (L) 28.1 (L)    2045:  Colitis on previous CT scan.  +UTI, UC pending.  Will start IV cipro/flagyl for colitis (cipro will cover for UTI).  Potassium repleted IV.  H/H lower than previous but stool is heme negative.  No stooling while in the ED.  Dx and testing d/w pt and family.  Questions answered.  Verb understanding, agreeable to admit.  T/C to [facility]. Phillips Odor, case discussed, including:  HPI, pertinent PM/SHx, VS/PE, dx testing, ED course and treatment:  Agreeable to admit, requests to write temporary orders, obtain medical bed.        Laray Anger, DO 06/26/12 [phone]

## 2012-06-24 NOTE — ED Notes (Signed)
Pt has been having ab pain since Thursday. Has h/o chronic ab pain.  Stated she has been vomiting blood since Monday, was seen by dr.fields on Thursday-had ct scan Thursday, cont. To get worse. +diarrhea, +fever since Sunday.

## 2012-06-24 NOTE — H&P (Addendum)
[facility] History and Physical  Jordan Haney WRU:[id number] DOB: 05/25/1955 DOA: 06/24/2012  Referring physician: Marice Potter PCP: Anson Oregon, DO  Specialists: Jonette Eva, MD Gastroenterology  Chief Complaint: Vomiting, Hematemesis, Diarrhea, Abdominal Pain, Urgency  HPI: Jordan Haney is a 57 y.o. 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 Prior to this surgery she had multiple ED visits and admissions with chronic abdominal pain, a 50 lbs weight loss and there had been concern about misuse of pain medications/pseudoaddiction. Her last admission and ED visit at [facility] was almost a year ago and she tells me that she has been doing fairly well at least from a system utilization perspective with much relief of pain and symptoms after surgery. She however tells me that she has never fully regained her strength, her weight has not improved and she suffers from a managable amount of chronic upper GI pain-she weighs 105 lbs and her prior healthy weight was around 155 lbs. In addition to her Gi issues, she has a history of possible RA for which she was taking Humira over a year ago, osteoporosis with compression fractures, fatty liver and HTN.  About 1 weeks ago she started having a recurrence of her abdominal pain with associated nausea, vomiting and hematemesis. The hematemesis started after several episodes of "dry heaving" and was noted to be streaking on a washcloth. She also had associated epigastric pain with radiation of pain to her back and odynophagia. She reports developing a fever of 100.1 and joint pain. She came in for evaluation this evening on the advice of Dr. Darrick Penna after developing worsening lower abdominal pain that was 10/10 and profuse diarrhea,  fecal urgency with cramping -a CT obtained as an outpatient showed Colitis. She describes the diarrhea as initially light colored, almost white and with mucous,and then it became darker and more formed, with urgency and bowel incontinence. No obvious blood.   In the ED her Hb was 8.5 down from 9.4 2 days ago in Dr. Darrick Penna office and she has severe abdominal pain despite IV opiates and has evidence of dehydration and hypokalemia. She is being admitted for further GI evaluation and management.  Review of Systems: Review of Systems  Constitutional: Positive for fever, chills, weight loss and malaise/fatigue. Negative for diaphoresis.  Eyes: Negative for blurred vision.  Respiratory: Negative for cough, hemoptysis and sputum production.   Cardiovascular: Negative for chest pain, palpitations, orthopnea and leg swelling.  Gastrointestinal: Positive for nausea, vomiting, abdominal pain and diarrhea. Negative for heartburn.  Genitourinary: Positive for urgency and frequency. Negative for dysuria, hematuria and flank pain.  Musculoskeletal: Positive for myalgias, joint pain and falls.       Reports having fallen and broken her right ankle 2 days ago, in boot immobilizer.  Skin: Negative for itching and rash.  Neurological: Positive for dizziness, weakness and headaches.  Endo/Heme/Allergies: Bruises/bleeds easily.  Psychiatric/Behavioral: Positive for depression. The patient is nervous/anxious.   All other systems reviewed and are negative.    Past Medical History  Diagnosis Date  . RA (rheumatoid arthritis)  2008  . MI (myocardial infarction) 2008    Not well documented, patient reports reassuring cardiac catheterization and stress testing in [place]  . Constipation 03/21/2011  . Hypokalemia 03/21/2011  . Fatty liver 03/21/2011  . Pancreatitis     states  3 years ago, very severe, ?biliary pancreatitis   . T12 compression fracture 2011  . Duodenal ulcer     remote per patient. +BC powders,  patient report negative EGD six months ago  . Dilated pancreatic duct     ?chronic pancreatitis, EUS pending (06/02/11)  . Kidney stones   . Chronic abdominal pain    Past Surgical History  Procedure Laterality Date  . Cholecystectomy    . Knee surgery    . Appendectomy    . Complete hysterectomy    . Ventral wall hernia    . Esophagogastroduodenoscopy  08/2010    Dr. Samuella Cota, Versed. small hh, mild prepylori gastritis. path unavailable  . Esophagogastroduodenoscopy  04/2010    Dr. Samuella Cota, Versed. small hh, mild distal esophagitis, antral gastritis and duodenitis. Bx mild chronic gastritis and no H.pylori  . Esophagogastroduodenoscopy  08/2009    Dr. Allena Katz, Versed. Moderate gastritis, moderate duodenitis with nodularity in proximal duodenal bulb, bx chronic gastritis, no helicobacter, mild chronic duodenitis  . Esophagogastroduodenoscopy  02/2007    Dr. Samuella Cota, Versed. antral gastritis and duodenal ulcers. Bx mild chronic gastritis. No bx from duodenal ulcer available.  Gaspar Bidding dilation  04/06/2011    Procedure: SAVORY DILATION;  Surgeon: Arlyce Harman, MD;  Location: [facility];  Service: Endoscopy;  Laterality: N/A;  16 mm  . Esophagogastroduodenoscopy  04/06/11    Dr. Darrick Penna: 1-2 cm hiatal hernia, mod gastritis, 29mmX3mm linear ulcer in duodenal bulb, stricture 1st part of duodenum, dilated to 12 mm  . Esophagogastroduodenoscopy  05/12/11    partial gastric oulet obstruction secondary to stricture between bulb/2nd portion of duodenum with marked friability and inflammation but no discrete ulcer, dilated stomach. s/p dilation but high risk for restenosis  . Esophagogastroduodenoscopy  06/22/2011    Procedure: ESOPHAGOGASTRODUODENOSCOPY (EGD);  Surgeon: West Bali, MD;  Location: [facility];  Service: Endoscopy;  Laterality: N/A;  . Gastrojejunostomy  06/28/2011    Procedure: GASTROJEJUNOSTOMY;  Surgeon: Fabio Bering, MD;  Location: [facility];  Service: General;  Laterality: N/A;  Roux-en-y,  Choledocalduodenostomy  . Abdominal hysterectomy    . Kidney stone surgery     Social History:  reports that she has never smoked. She does not have any smokeless tobacco history on file. She reports that she does not drink alcohol or use illicit drugs.   Allergies  Allergen Reactions  . Compazine Anaphylaxis  . Promethazine Hcl Anaphylaxis  . Vistaril (Hydroxyzine Hcl) Other (See Comments)    Reaction: legs shake  . Gabapentin Other (See Comments)    Reactions: legs shake  . Latex Swelling    Family History  Problem Relation Age of Onset  . Colon cancer Neg Hx   . Liver cancer Neg Hx   . Breast cancer Neg Hx   . Inflammatory bowel disease Neg Hx   . Heart attack Mother     Prior to Admission medications   Medication Sig Start Date End Date Taking? Authorizing Provider  adalimumab (HUMIRA) 40 MG/0.8ML injection Inject 40 mg into the skin every 7 (seven) days.    Historical Provider, MD  ALPRAZolam Prudy Feeler) 0.5 MG tablet Take 0.5 mg by mouth 2 (two) times daily.    Historical Provider, MD  amLODipine (NORVASC) 5 MG tablet Take 1 tablet (5 mg total) by mouth daily. 05/20/11 05/19/12  Wilson Singer, MD  ciprofloxacin (CIPRO) 500 MG tablet 1 PO BID FOR 7 DAYS 06/22/12   West Bali, MD  HYDROcodone-acetaminophen (NORCO/VICODIN) 5-325 MG  per tablet 1 OR 2 EVERY 4-6 H PRN FOR PAIN 06/22/12   West Bali, MD  lipase/protease/amylase (CREON-10/PANCREASE) 12000 UNITS CPEP Take 1 capsule by mouth 3 (three) times daily with meals.    Historical Provider, MD  metroNIDAZOLE (FLAGYL) 500 MG tablet 1 po tid for 10 days 06/22/12   West Bali, MD  ondansetron (ZOFRAN) 4 MG tablet 1 PO 30 MINS PRIOR TO MEALS AND AT BEDTIME 06/22/12   West Bali, MD  pantoprazole (PROTONIX) 40 MG tablet 1 PO 30 MINS PRIOR TO MEALS BID 06/22/12   West Bali, MD  pregabalin (LYRICA) 100 MG capsule Take 200 mg by mouth 2 (two) times daily.     Historical Provider, MD  zolpidem (AMBIEN) 5 MG tablet Take 5 mg  by mouth at bedtime as needed for sleep.    Historical Provider, MD   Physical Exam: Filed Vitals:   06/24/12 1814 06/24/12 2125  BP: 117/90 135/78  Pulse: 78 87  Temp: 98.3 F (36.8 C)   TempSrc: Oral   Resp: 20 20  Height: 5\' 4"  (1.626 m)   Weight: 47.628 kg (105 lb)   SpO2: 100% 100%     General:  Thin, pale, moderately ill-appearing woman, mild distress  Eyes: Normal  ENT: Normal no signs of thrush  Neck: Normal  Cardiovascular: Regular rate and rhythm no murmurs rubs or gallops  Respiratory: Good auscultation bilaterally  Abdomen: Diffuse tenderness to palpation, pain limits exam patient is guarding, not distractible  Skin: No rashes, or lesions  Musculoskeletal: No swollen joints or edema  Psychiatric: Depressed affect  Neurologic: Nonfocal  Labs on Admission:  Basic Metabolic Panel:  Recent Labs Lab 06/22/12 1548 06/24/12 1830  NA 133* 131*  K 3.5 3.1*  CL 95* 93*  CO2 28 26  GLUCOSE 86 95  BUN 5* 4*  CREATININE 0.57 0.66  CALCIUM 9.7 9.1   Liver Function Tests:  Recent Labs Lab 06/22/12 1548 06/24/12 1830  AST 29 25  ALT 11 10  ALKPHOS 160* 146*  BILITOT 0.3 0.2*  PROT 7.7 6.5  ALBUMIN 4.2 3.7    Recent Labs Lab 06/22/12 1548 06/24/12 1830  LIPASE 18 17   No results found for this basename: AMMONIA,  in the last 168 hours CBC:  Recent Labs Lab 06/22/12 1548 06/24/12 1830  WBC 5.9 4.6  NEUTROABS 2.8 2.1  HGB 9.4* 8.5*  HCT 30.6* 28.1*  MCV 74.6* 75.1*  PLT 296 223   Radiological Exams on Admission: Dg Abd Acute W/chest  06/24/2012  *RADIOLOGY REPORT*  Clinical Data: Increased abdominal pain since Thursday.  Passing blood since Monday.  History of surgery 11 months ago to remove portion of the stomach and small bowel.  ACUTE ABDOMEN SERIES (ABDOMEN 2 VIEW & CHEST 1 VIEW)  Comparison: 10/14/2011  Findings: Heart size is normal.  The lungs are free of focal consolidations and pleural effusions.  The aorta is tortuous.   Surgical clips are identified in the right mid abdomen.  Bowel gas pattern is nonobstructive. Contrast is present in the colon following recent CT exam.  Colonic loops do not appear distended. No free intraperitoneal air beneath diaphragm.  Patient has had previous mid thoracic vertebral plasty.  IMPRESSION:  1. No evidence for acute cardiopulmonary abnormality. 2.  Nonobstructive bowel gas pattern.   Original Report Authenticated By: Norva Pavlov, M.D.    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 Almost exactly one year later she presents with a recurrence of her abdominal pain and epigastric pain with vomiting and hematemesis which per history may be related to Mallory-Weiss tear, there has been no clinically significant upper GI blood loss or vomiting since she has been in the emergency department. CT scan of her abdomen obtained 2 days ago showed resolution of the biliary tract dilation from a year ago and stable postop changes.  Her new symptoms of lower GI issues including pain, cramping, tenesmus, bowel incontinence, and multiple episodes of diarrhea and a CT scan confirming colitis and bowel wall thickening of the transverse colon, descending colon, sigmoid colon or concerning for inflammatory bowel disease especially if considered in relation to her extraintestinal manifestations of joint pain previously  thought to be RA (also received Humira), age advanced osteoporosis, elevated serum alkaline phosphatase and even her prior hepatobiliary problems. Also in the differential for her colitis would be infectious or ischemic causes.  1. Colitis, unknown etiolgy.  Symptomatic treatment for pain and cramping IV fentanyl, sublingual Levsin  N.p.o. with D5 normal saline hydration  GI pathogen PCR panel : Campylobacter, Escherichia coli, Shigella, Giardia , C. Difficile  Stool for ova and parasites, she has a mildly increased relative eosinophil count on her CBC.  Inpatient consult gastroenterology for further evaluation and management  ESR pending.  Stools sent for occult blood.  Empirically treat for infectious etiology with IV Cipro and Flagyl.  2. Hematemesis and epigastric pain  History most consistent with Mallory-Weiss tear, will need upper endoscopy  Inpatient gastroenterology consult  Monitor hemoglobin  N.p.o.  IV protonix  3. Anemia, 8.5 down from 9.5 2 days ago. Anemia panel pending. Possible low level of occult GI blood loss vs. Nutritional/chronic disease related  4. Osteoporosis, CT scan showed a T12 compression fracture, patient has severe osteoporosis, she will need bisphosphonate, calcium and vitamin D supplementation as an outpatient.  5. Failure to Thrive, will obtain nutrition consult.  6. HTN, resumed home norvasc.  7. Abnormal Alk Phos, further eval per GI   8. Dehydration, hypokalemia: Will replete hydration with D5 normal saline, patient is refusing IV potassium therefore we will need to place a PICC line.  Code Status: Full Code Family Communication: Discussed plan of care with patient in detail. Disposition Plan: Admitted as inpatient anticipated 3 to four-day hospitalization Time spent: 70 minutes  [facility] Pager [phone]  If 7PM-7AM, please contact night-coverage [email or web address] Password [email or web address] 06/24/2012, 10:08  PM

## 2012-06-24 NOTE — Telephone Encounter (Signed)
Pt called; N and V; worsening abdominal pain; recently diagnosed with colitis(CT). Pt states she declined admission when seen by [facility]; I recommended she come to the ED ASAP

## 2012-06-25 ENCOUNTER — Encounter (HOSPITAL_COMMUNITY)
Admission: EM | Disposition: A | Discharge status: Home / Self Care | Payer: Self-pay | Source: Home / Self Care | Attending: Internal Medicine

## 2012-06-25 ENCOUNTER — Encounter (HOSPITAL_COMMUNITY): Payer: Self-pay | Admitting: Urology

## 2012-06-25 DIAGNOSIS — M810 Age-related osteoporosis without current pathological fracture: Secondary | ICD-10-CM | POA: Diagnosis present

## 2012-06-25 DIAGNOSIS — K5289 Other specified noninfective gastroenteritis and colitis: Secondary | ICD-10-CM

## 2012-06-25 DIAGNOSIS — K284 Chronic or unspecified gastrojejunal ulcer with hemorrhage: Secondary | ICD-10-CM

## 2012-06-25 DIAGNOSIS — D649 Anemia, unspecified: Secondary | ICD-10-CM

## 2012-06-25 DIAGNOSIS — S22000A Wedge compression fracture of unspecified thoracic vertebra, initial encounter for closed fracture: Secondary | ICD-10-CM

## 2012-06-25 DIAGNOSIS — K922 Gastrointestinal hemorrhage, unspecified: Secondary | ICD-10-CM

## 2012-06-25 DIAGNOSIS — R109 Unspecified abdominal pain: Secondary | ICD-10-CM

## 2012-06-25 DIAGNOSIS — K920 Hematemesis: Secondary | ICD-10-CM

## 2012-06-25 HISTORY — PX: ESOPHAGOGASTRODUODENOSCOPY: SHX5428

## 2012-06-25 LAB — TSH: TSH: 6.97 u[IU]/mL — ABNORMAL HIGH (ref 0.350–4.500)

## 2012-06-25 LAB — FOLATE: Folate: 9.2 ng/mL

## 2012-06-25 LAB — IRON AND TIBC
Saturation Ratios: 4 % — ABNORMAL LOW (ref 20–55)
UIBC: 487 ug/dL — ABNORMAL HIGH (ref 125–400)

## 2012-06-25 LAB — RAPID URINE DRUG SCREEN, HOSP PERFORMED
Benzodiazepines: POSITIVE — AB
Cocaine: NOT DETECTED
Opiates: POSITIVE — AB

## 2012-06-25 LAB — OCCULT BLOOD, POC DEVICE: Fecal Occult Bld: NEGATIVE

## 2012-06-25 SURGERY — EGD (ESOPHAGOGASTRODUODENOSCOPY)
Anesthesia: Moderate Sedation | Laterality: Left

## 2012-06-25 MED ORDER — EPINEPHRINE HCL 0.1 MG/ML IJ SOLN
INTRAMUSCULAR | Status: AC
  Filled 2012-06-25: qty 10

## 2012-06-25 MED ORDER — MEPERIDINE HCL 100 MG/ML IJ SOLN
INTRAMUSCULAR | Status: AC
  Filled 2012-06-25: qty 3

## 2012-06-25 MED ORDER — ONDANSETRON HCL 4 MG/2ML IJ SOLN
INTRAMUSCULAR | Status: AC
  Filled 2012-06-25: qty 2

## 2012-06-25 MED ORDER — ONDANSETRON HCL 4 MG/2ML IJ SOLN
INTRAMUSCULAR | Status: DC | PRN
  Administered 2012-06-25: 4 mg via INTRAVENOUS

## 2012-06-25 MED ORDER — SODIUM CHLORIDE 0.9 % IV SOLN
8.0000 mg/h | INTRAVENOUS | Status: DC
  Administered 2012-06-25 – 2012-06-28 (×6): 8 mg/h via INTRAVENOUS
  Filled 2012-06-25 (×10): qty 80

## 2012-06-25 MED ORDER — MIDAZOLAM HCL 5 MG/5ML IJ SOLN
INTRAMUSCULAR | Status: AC
  Filled 2012-06-25: qty 20

## 2012-06-25 MED ORDER — SODIUM CHLORIDE 0.9 % IV SOLN
INTRAVENOUS | Status: DC
  Administered 2012-06-25: 12:00:00 via INTRAVENOUS

## 2012-06-25 MED ORDER — MIDAZOLAM HCL 5 MG/5ML IJ SOLN
INTRAMUSCULAR | Status: DC | PRN
  Administered 2012-06-25 (×4): 2 mg via INTRAVENOUS

## 2012-06-25 MED ORDER — METRONIDAZOLE IN NACL 5-0.79 MG/ML-% IV SOLN
INTRAVENOUS | Status: AC
  Filled 2012-06-25: qty 100

## 2012-06-25 MED ORDER — BUTAMBEN-TETRACAINE-BENZOCAINE 2-2-14 % EX AERO
INHALATION_SPRAY | CUTANEOUS | Status: DC | PRN
  Administered 2012-06-25: 2 via TOPICAL

## 2012-06-25 MED ORDER — SODIUM CHLORIDE 0.9 % IJ SOLN
INTRAMUSCULAR | Status: DC | PRN
  Administered 2012-06-25: 13:00:00

## 2012-06-25 MED ORDER — POTASSIUM CHLORIDE 20 MEQ/15ML (10%) PO LIQD
40.0000 meq | Freq: Every day | ORAL | Status: DC
  Administered 2012-06-25 – 2012-06-29 (×5): 40 meq via ORAL
  Filled 2012-06-25 (×5): qty 30

## 2012-06-25 MED ORDER — STERILE WATER FOR IRRIGATION IR SOLN
Status: DC | PRN
  Administered 2012-06-25: 12:00:00

## 2012-06-25 MED ORDER — SUCRALFATE 1 GM/10ML PO SUSP
1.0000 g | Freq: Three times a day (TID) | ORAL | Status: DC
  Administered 2012-06-25 – 2012-06-29 (×16): 1 g via ORAL
  Filled 2012-06-25 (×16): qty 10

## 2012-06-25 MED ORDER — MEPERIDINE HCL 100 MG/ML IJ SOLN
INTRAMUSCULAR | Status: DC | PRN
  Administered 2012-06-25 (×3): 50 mg via INTRAVENOUS

## 2012-06-25 NOTE — Progress Notes (Signed)
Patient said that IV potassium was causing severe burning. I tried slowing the rate and she demanded that I stop the infusion. I notified doctor Phillips Odor. She put in orders for a PICC line. I also tried to set up SCDs for the patient to wear and she refused those stating she had them in a previous admission and they were unbearable. She also stated she refused SCDs due to previous healed injury in her lower leg. Alfonse Flavors F

## 2012-06-25 NOTE — Op Note (Signed)
[facility] [address] [place], [address]   ENDOSCOPY PROCEDURE REPORT  PATIENT: Jordan Haney, Jordan Haney  MR#: [id number] BIRTHDATE: 15-Aug-1955 , 57  yrs. old GENDER: Female ENDOSCOPIST: R.  Roetta Sessions, MD FACP [facility] REFERRED BY:  Julaine Fusi, D.O. PROCEDURE DATE:  06/25/2012 PROCEDURE:     EGD with bleeding control therapy  INDICATIONS:     Hematemesis  INFORMED CONSENT:   The risks, benefits, limitations, alternatives and imponderables have been discussed.  The potential for biopsy, esophogeal dilation, etc. have also been reviewed.  Questions have been answered.  All parties agreeable.  Please see the history and physical in the medical record for more information.  MEDICATIONS:    Versed 8 mg IV and Demerol 150 mg IV and Zofran 4 mg IV. Cetacaine spray  DESCRIPTION OF PROCEDURE:   The XB-1478G ([id number])  endoscope was introduced through the mouth and advanced to the second portion of the duodenum without difficulty or limitations.  The mucosal surfaces were surveyed very carefully during advancement of the scope and upon withdrawal.  Retroflexion view of the proximal stomach and esophagogastric junction was performed.      FINDINGS: Normal esophagus. Surgically altered stomach with anastomosis to  small intestine noted. Couple of protruding sutures at the anastomosis.  Patent efferent limb.  Patient had an oozing 1 cm anastomotic ulcer as depicted in the above photos.  THERAPEUTIC / DIAGNOSTIC MANEUVERS PERFORMED:  The bleeding ulcer was treated with a total of 4 cc of 1-10,000 epinephrine injected submucosally into the ulcer base margin. Subsequently, it was sealed with several applications of the Gold probe at 20 J each.   COMPLICATIONS:  None  IMPRESSION:    Bleeding anastomotic ulcer-status post bleeding control therapy as outlined above   RECOMMENDATIONS:   PPI via constant intravenous infusion. Clear liquid diet.   Add Carafate .   Follow H&H.    _______________________________ R. Roetta Sessions, MD FACP [facility] eSigned:  R. Roetta Sessions, MD FACP [facility] 06/25/2012 12:47 PM     CC:

## 2012-06-25 NOTE — Progress Notes (Signed)
Unable to collect any stool specimens on this shift. Patient has not had a bowel movement or vomited since she has been on the floor. Jordan Haney

## 2012-06-25 NOTE — Progress Notes (Signed)
     Subjective: This lady was admitted once again with abdominal pain, hematemesis and diarrhea. Her hemoglobin has dropped. She has had no further diarrhea while she has been in the hospital. Apparently there is no in any further vomiting. She does still have pain. We have not seen her in approximately one year or so.           Physical Exam: Blood pressure 144/90, pulse 82, temperature 97.5 F (36.4 C), temperature source Oral, resp. rate 20, height 5\' 4"  (1.626 m), weight 50.4 kg (111 lb 1.8 oz), SpO2 99.00%. Appears to be in pain. Abdomen is soft and guarded on palpation by hand. However when I listened with my stethoscope and pressed into the abdomen, there was no guarding whatsoever and no indication of pain. Bowel sounds are present and normal. She remains alert and orientated. She does not look toxic or septic.   Investigations:     Basic Metabolic Panel:  Recent Labs  84/13/24 1548 06/24/12 1830  NA 133* 131*  K 3.5 3.1*  CL 95* 93*  CO2 28 26  GLUCOSE 86 95  BUN 5* 4*  CREATININE 0.57 0.66  CALCIUM 9.7 9.1  MG  --  1.8   Liver Function Tests:  Recent Labs  06/22/12 1548 06/24/12 1830  AST 29 25  ALT 11 10  ALKPHOS 160* 146*  BILITOT 0.3 0.2*  PROT 7.7 6.5  ALBUMIN 4.2 3.7     CBC:  Recent Labs  06/22/12 1548 06/24/12 1830  WBC 5.9 4.6  NEUTROABS 2.8 2.1  HGB 9.4* 8.5*  HCT 30.6* 28.1*  MCV 74.6* 75.1*  PLT 296 223    Dg Abd Acute W/chest  06/24/2012  *RADIOLOGY REPORT*  Clinical Data: Increased abdominal pain since Thursday.  Passing blood since Monday.  History of surgery 11 months ago to remove portion of the stomach and small bowel.  ACUTE ABDOMEN SERIES (ABDOMEN 2 VIEW & CHEST 1 VIEW)  Comparison: 10/14/2011  Findings: Heart size is normal.  The lungs are free of focal consolidations and pleural effusions.  The aorta is tortuous.  Surgical clips are identified in the right mid abdomen.  Bowel gas pattern is nonobstructive. Contrast  is present in the colon following recent CT exam.  Colonic loops do not appear distended. No free intraperitoneal air beneath diaphragm.  Patient has had previous mid thoracic vertebral plasty.  IMPRESSION:  1. No evidence for acute cardiopulmonary abnormality. 2.  Nonobstructive bowel gas pattern.   Original Report Authenticated By: Norva Pavlov, M.D.       Medications: I have reviewed the patient's current medications.  Impression: 1. Abdominal pain. CT scan is abnormal suggestive of a colitis, source is not clear. Although this lady does and did have pathology, once again her physical findings seem to be out of proportion to her symptoms. 2. Microcytic Anemia with dropping hemoglobin. History of hematemesis. 3. History of weight loss. 4. Hypokalemia.     Plan: 1. Continue with intravenous antibiotics for the time being. 2. Replete potassium with liquid potassium, she said she will try to take this. She does not tolerate intravenous potassium. 3. DVT prophylaxis-she has refused sequential compression device. She cannot have heparin. She understands the risk of DVT and a pulmonary embolism which could be fatal. 4. Await gastroenterology opinion.     LOS: 1 day   Wilson Singer Pager [phone]  06/25/2012, 9:56 AM

## 2012-06-25 NOTE — Consult Note (Signed)
Referring Provider: Phillips Odor Primary Care Physician:  Anson [place], DO Primary Gastroenterologist:  Dr. Darrick Penna  Reason for Consultation:  Nausea and vomiting; hematemesis drop in hemoglobin and colitis on CT  HPI: 57 year old lady with a history of complicated NSAID-induced peptic ulcer disease with a one week history of abdominal cramps, profuse non-bloody diarrhea admitted to the hospital last evening after experiencing nausea and vomiting over the past 2 days with some blood in the emesis. She remains hemodynamically stable. Hemoglobin last evening 8.5  - down from 9.4 on April 3; it was 11.4 back in August of last year. Patient has not had any melena. CT  3 days ago demonstrated  left sided proctocolitis. I have reviewed that CT scan with Dr. Bradly Chris. Findings do appear real but somewhat mild. No megacolon. Plain films done last evening actually showed improvement in this mild colitis seen on prior CT. She was started empirically on Cipro and Flagyl 3 days ago. Hospitalization offered by Dr. Darrick Penna but declined by the patient. Cipro and Flagyl had been continued via the IV route this hospitalization. GI pathology panel including C. difficile have been ordered. Patient denies any recent antibiotic exposure. No sick contacts. Baseline is relative constipation.  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 Refractory aspirin powder use. 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 This was associated with resolution of her bowel pain. She was doing very well, eating, etc.  It is notable the patient was on pancreatic enzymes postoperatively; felt she they were no longer needed. She stopped the supplements and over the past 6-8 months tells me she's lost 50 pounds -  has had chronic early satiety and  has not thrived.  She's been given a diagnosis of  chronic pancreatitis previously (dilated pancreatic duct but no other morphological changes on prior imaging-EUS scheduled but not done due to urgent need for surgery last year). Reported history of acute pancreatitis some 4 years ago felt to be biliary in origin-gallbladder out.  She does adamantly deny the use of any nonsteroidal agents since her operation. Incidentally, her common bile  was injured during surgery, requiring a choledochojejunostomy. The intrahepatic biliary dilation seen on past imaging is much improved on the most current CT. She does have marked steatosis of the liver and a history of elevated liver enzymes previously. No prior colonoscopy on record.     Past Medical History  Diagnosis Date  . RA (rheumatoid arthritis)  2008  . MI (myocardial infarction) 2008    Not well documented, patient reports reassuring cardiac catheterization and stress testing in [place]  . Constipation 03/21/2011  . Hypokalemia 03/21/2011  . Fatty liver 03/21/2011  . Pancreatitis     states 3 years ago, very severe, ?biliary pancreatitis   . T12 compression fracture 2011  . Duodenal ulcer     remote per patient. +BC powders, patient report negative EGD six months ago  . Dilated pancreatic duct     ?chronic pancreatitis, EUS pending (06/02/11)  . Kidney stones   . Chronic abdominal pain     Past Surgical History  Procedure Laterality Date  . Cholecystectomy    . Knee surgery    . Appendectomy    . Complete hysterectomy    . Ventral wall hernia    . Esophagogastroduodenoscopy  08/2010    Dr. Samuella Cota, [place]. small hh, mild prepylori gastritis. path unavailable  . Esophagogastroduodenoscopy  04/2010    Dr. Samuella Cota, [place]. small hh, mild distal esophagitis, antral gastritis and duodenitis. Bx mild chronic gastritis and no H.pylori  . Esophagogastroduodenoscopy  08/2009    Dr. Allena Katz, [place]. Moderate gastritis, moderate duodenitis with nodularity in proximal duodenal bulb, bx chronic  gastritis, no helicobacter, mild chronic duodenitis  . Esophagogastroduodenoscopy  02/2007    Dr. Samuella Cota, [place]. antral gastritis and duodenal ulcers. Bx mild chronic gastritis. No bx from duodenal ulcer available.  Gaspar Bidding dilation  04/06/2011    Procedure: SAVORY DILATION;  Surgeon: Arlyce Harman, MD;  Location: [facility];  Service: Endoscopy;  Laterality: N/A;  16 mm  . Esophagogastroduodenoscopy  04/06/11    Dr. Darrick Penna: 1-2 cm hiatal hernia, mod gastritis, 90mmX3mm linear ulcer in duodenal bulb, stricture 1st part of duodenum, dilated to 12 mm  . Esophagogastroduodenoscopy  05/12/11    partial gastric oulet obstruction secondary to stricture between bulb/2nd portion of duodenum with marked friability and inflammation but no discrete ulcer, dilated stomach. s/p dilation but high risk for restenosis  . Esophagogastroduodenoscopy  06/22/2011    Procedure: ESOPHAGOGASTRODUODENOSCOPY (EGD);  Surgeon: West Bali, MD;  Location: [facility];  Service: Endoscopy;  Laterality: N/A;  . Gastrojejunostomy  06/28/2011    Procedure: GASTROJEJUNOSTOMY;  Surgeon: Fabio Bering, MD;  Location: [facility];  Service: General;  Laterality: N/A;  Roux-en-y, Choledocalduodenostomy  . Abdominal hysterectomy    . Kidney stone surgery      Prior to Admission medications   Medication Sig Start Date End Date Taking? Authorizing Provider  ALPRAZolam Prudy Feeler) 0.5 MG tablet Take 0.5 mg by mouth 2 (two) times daily.   Yes Historical Provider, MD  ciprofloxacin (CIPRO) 500 MG tablet 1 PO BID FOR 7 DAYS 06/22/12  Yes West Bali, MD  HYDROcodone-acetaminophen (NORCO/VICODIN) 5-325 MG per tablet Take 1 tablet by mouth 2 (two) times daily. Patient has RA and has broken her back in the past   Yes Historical Provider, MD  metroNIDAZOLE (FLAGYL) 500 MG tablet 1 po tid for 10 days 06/22/12  Yes West Bali, MD  ondansetron (ZOFRAN) 4 MG tablet 1 PO 30 MINS PRIOR TO MEALS AND AT BEDTIME 06/22/12  Yes Sandi L Fields, MD  pantoprazole  (PROTONIX) 40 MG tablet 1 PO 30 MINS PRIOR TO MEALS BID 06/22/12  Yes West Bali, MD  zolpidem (AMBIEN) 5 MG tablet Take 5 mg by mouth at bedtime as needed for sleep.   Yes Historical Provider, MD  adalimumab (HUMIRA) 40 MG/0.8ML injection Inject 40 mg into the skin every 7 (seven) days.    Historical Provider, MD  amLODipine (NORVASC) 5 MG tablet Take 1 tablet (5 mg total) by mouth daily. 05/20/11 05/19/12  Wilson Singer, MD    Current Facility-Administered Medications  Medication Dose Route Frequency Provider Last Rate Last Dose  . ciprofloxacin (CIPRO) IVPB 400 mg  400 mg Intravenous Q12H Edsel Petrin, DO   400 mg at 06/25/12 0807  . dextrose 5 %-0.9 % sodium chloride infusion   Intravenous Continuous Edsel Petrin, DO 125 mL/hr at 06/25/12 0806 1 mL at 06/25/12 0806  . fentaNYL (SUBLIMAZE) injection 25 mcg  25 mcg Intravenous Q2H PRN Edsel Petrin, DO   25 mcg at 06/25/12 9604  . hyoscyamine (LEVSIN SL) SL tablet 0.125 mg  0.125 mg Sublingual Q4H PRN Edsel Petrin, DO      . LORazepam (ATIVAN) injection 0.25 mg  0.25 mg Intravenous Q4H PRN Edsel Petrin,  DO      . metoCLOPramide (REGLAN) injection 10 mg  10 mg Intravenous Q8H PRN Edsel Petrin, DO   10 mg at 06/24/12 2228  . metroNIDAZOLE (FLAGYL) IVPB 500 mg  500 mg Intravenous Q8H Edsel Petrin, DO   500 mg at 06/25/12 9604  . ondansetron (ZOFRAN) injection 4 mg  4 mg Intravenous Q8H PRN Edsel Petrin, DO      . pantoprazole (PROTONIX) injection 40 mg  40 mg Intravenous Q12H Edsel Petrin, DO   40 mg at 06/25/12 1022  . potassium chloride 20 MEQ/15ML (10%) liquid 40 mEq  40 mEq Oral Daily Wilson Singer, MD        Allergies as of 06/24/2012 - Review Complete 06/24/2012  Allergen Reaction Noted  . Compazine Anaphylaxis 03/20/2011  . Promethazine hcl Anaphylaxis 03/20/2011  . Vistaril (hydroxyzine hcl) Other (See Comments) 03/20/2011  . Gabapentin Other (See Comments) 07/21/2011   . Latex Swelling 06/28/2011    Family History  Problem Relation Age of Onset  . Colon cancer Neg Hx   . Liver cancer Neg Hx   . Breast cancer Neg Hx   . Inflammatory bowel disease Neg Hx   . Heart attack Mother     History   Social History  . Marital Status: Divorced    Spouse Name: N/A    Number of Children: 1  . Years of Education: N/A   Occupational History  . unemployed    Social History Main Topics  . Smoking status: Never Smoker   . Smokeless tobacco: Not on file  . Alcohol Use: No  . Drug Use: No  . Sexually Active: Yes    Birth Control/ Protection: Surgical   Other Topics Concern  . Not on file   Social History Narrative  . No narrative on file    Review of Systems: Gen: Denies any fever, chills, sweats, anorexia, fatigue,  CV: Denies chest pain, angina, palpitations, syncope, orthopnea, PND, peripheral edema, and claudication. Resp: Denies dyspnea at rest, dyspnea with exercise, cough, sputum, wheezing, coughing up blood, and pleurisy. GI:    Denies dysphagia or odynophagia. Derm: Denies rash, itching, dry skin, hives, moles, warts, or unhealing ulcers.  Psych: Denies d, suicidal ideation, hallucinations, paranoia, and confusion. Heme: Denies bruising, bleeding, and enlarged lymph nodes.   Physical Exam: Vital signs in last 24 hours: Temp:  [97.5 F (36.4 C)-98.6 F (37 C)] 97.5 F (36.4 C) (04/06 0445) Pulse Rate:  [78-87] 82 (04/06 0445) Resp:  [20] 20 (04/06 0445) BP: (117-144)/(74-90) 144/90 mmHg (04/06 0445) SpO2:  [99 %-100 %] 99 % (04/06 0445) Weight:  [105 lb (47.628 kg)-111 lb 1.8 oz (50.4 kg)] 111 lb 1.8 oz (50.4 kg) (04/05 2248) Last BM Date: 06/24/12 General:  Thin somewhat cachectic chronically ill lady alert conversant appears to be in no acute distress. Head:  Normocephalic and atraumatic. Eyes:  Sclera clear, no icterus.   Conjunctiva pink. Ears:  Normal auditory acuity. Nose:  No deformity, discharge,  or lesions. Mouth:  No  deformity or lesions, dentition normal. Neck:  Supple; no masses or thyromegaly. Lungs:  Clear throughout to auscultation.   No wheezes, crackles, or rhonchi. No acute distress. Heart:  Regular rate and rhythm; no murmurs, clicks, rubs,  or gallops. Abdomen:  Midline vertical laparotomy scar. Nondistended. Positive bowel sounds. She had she has mild diffuse tenderness to palpation more in the epigastrium than anywhere else. No obvious mass or organomegaly sounds, without guarding, and without  rebound.   Msk:  Symmetrical without gross deformities. Normal posture. Pulses:  Normal pulses noted. Extremities:  Without clubbing or edema. Neurologic:  Alert and  oriented x4;  grossly normal neurologically. Skin:  Intact without significant lesions or rashes. Cervical Nodes:  No significant cervical adenopathy. Psych:  Alert and cooperative. Normal mood and affect.   Lab Results:  Recent Labs  06/22/12 1548 06/24/12 1830  WBC 5.9 4.6  HGB 9.4* 8.5*  HCT 30.6* 28.1*  PLT 296 223   BMET  Recent Labs  06/22/12 1548 06/24/12 1830  NA 133* 131*  K 3.5 3.1*  CL 95* 93*  CO2 28 26  GLUCOSE 86 95  BUN 5* 4*  CREATININE 0.57 0.66  CALCIUM 9.7 9.1   LFT  Recent Labs  06/24/12 1830  PROT 6.5  ALBUMIN 3.7  AST 25  ALT 10  ALKPHOS 146*  BILITOT 0.2*    Impression:   Pleasant 57 year old lady with a complicated peptic ulcer disease history admitted to the hospital with acute illness characterized by  Profuse, nonbloody watery diarrhea, inflammatory changes involving the colon on CT and now nausea vomiting / hematemesis over the past couple days with notable drop in her hemoglobin.  She remains hemodynamically stable. I suspect we are dealing with a Mallory-Weiss or reflux esophagitis;  I doubt recurrent peptic ulcer disease.   I suspect she has acquired a recent enteric infection account for her diarrhea and inflammatory changes on CT. Clinically, she does not have severe colitis  and actually appears to be improving. Most recent plain films are somewhat reassuring.  She has not thrived since her surgery (although immediately postoperatively it sounds that she did fairly well while on pancreatic enzyme supplementation). 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 She needs to get back on pancreatic enzyme supplements when feasible.  Recommendations:   Diagnostic EGD today to further evaluate her as to the etiology of bleeding.The risks, benefits, limitations, alternatives and imponderables have been reviewed. All parties agreeable.  Agree with continuing Cipro and Flagyl empirically pending review of stool study results.  Management of hypokalemia per attending Resume pancreatic enzyme supplements when feasible. Continue proton pump inhibitor therapy, anti-emetic therapy. Outpatient colonoscopy. Address fatty-appearing liver and elevated alkaline phosphatase with Dr. Darrick Penna as an outpatient. Thanks to the hospitalist service for allowing me to see this nice lady once again.

## 2012-06-26 ENCOUNTER — Encounter (HOSPITAL_COMMUNITY): Payer: Self-pay | Admitting: Internal Medicine

## 2012-06-26 DIAGNOSIS — K279 Peptic ulcer, site unspecified, unspecified as acute or chronic, without hemorrhage or perforation: Secondary | ICD-10-CM

## 2012-06-26 DIAGNOSIS — K289 Gastrojejunal ulcer, unspecified as acute or chronic, without hemorrhage or perforation: Secondary | ICD-10-CM

## 2012-06-26 DIAGNOSIS — K5289 Other specified noninfective gastroenteritis and colitis: Secondary | ICD-10-CM

## 2012-06-26 LAB — CBC
HCT: 29.5 % — ABNORMAL LOW (ref 36.0–46.0)
MCH: 23 pg — ABNORMAL LOW (ref 26.0–34.0)
MCHC: 30.5 g/dL (ref 30.0–36.0)
MCV: 75.3 fL — ABNORMAL LOW (ref 78.0–100.0)
RDW: 15.3 % (ref 11.5–15.5)

## 2012-06-26 LAB — URINE CULTURE
Colony Count: NO GROWTH
Culture: NO GROWTH

## 2012-06-26 LAB — COMPREHENSIVE METABOLIC PANEL
Albumin: 3.4 g/dL — ABNORMAL LOW (ref 3.5–5.2)
BUN: <3 mg/dL — ABNORMAL LOW (ref 6–23)
Calcium: 9.1 mg/dL (ref 8.4–10.5)
Creatinine, Ser: 0.51 mg/dL (ref 0.50–1.10)
GFR calc Af Amer: >90 mL/min (ref >90)
Glucose, Bld: 95 mg/dL (ref 70–99)
Potassium: 3.6 mEq/L (ref 3.5–5.1)
Total Protein: 6.3 g/dL (ref 6.0–8.3)

## 2012-06-26 MED ORDER — BOOST / RESOURCE BREEZE PO LIQD
1.0000 | Freq: Three times a day (TID) | ORAL | Status: DC
  Administered 2012-06-26 – 2012-06-29 (×8): 1 via ORAL

## 2012-06-26 MED ORDER — MORPHINE SULFATE 4 MG/ML IJ SOLN
4.0000 mg | INTRAMUSCULAR | Status: DC | PRN
  Administered 2012-06-26 – 2012-06-29 (×22): 4 mg via INTRAVENOUS
  Filled 2012-06-26 (×23): qty 1

## 2012-06-26 MED ORDER — LORAZEPAM 2 MG/ML IJ SOLN
1.0000 mg | INTRAMUSCULAR | Status: DC | PRN
  Administered 2012-06-26 – 2012-06-28 (×3): 1 mg via INTRAVENOUS
  Filled 2012-06-26 (×2): qty 1

## 2012-06-26 NOTE — Progress Notes (Signed)
Subjective: Complains of worsening epigastric pain this morning, no N/V. Not eating breakfast. No further diarrhea. Fentanyl IV every 3 hours.   Objective: Vital signs in last 24 hours: Temp:  [98.1 F (36.7 C)-99.8 F (37.7 C)] 98.3 F (36.8 C) (04/07 0414) Pulse Rate:  [66-100] 72 (04/07 0414) Resp:  [12-19] 18 (04/07 0414) BP: (135-173)/(83-110) 147/86 mmHg (04/07 0414) SpO2:  [95 %-99 %] 95 % (04/07 0414) Last BM Date: 06/24/12 General:   Alert and oriented, flat affect, worried Head:  Normocephalic and atraumatic. Eyes:  No icterus, sclera clear. Conjuctiva pink.  Mouth:  Without lesions, mucosa pink and moist.  Heart:  S1, S2 present, no murmurs noted.  Lungs: Clear to auscultation bilaterally, without wheezing, rales, or rhonchi.  Abdomen:  Bowel sounds present, soft, significantly TTP epigastric region with only light touch but non-distended. No HSM or hernias noted. No rebound or guarding. No masses appreciated  Msk:  Symmetrical without gross deformities. Normal posture. Extremities:  Without clubbing or edema. Neurologic:  Alert and  oriented x4;  grossly normal neurologically. Skin:  Warm and dry, intact without significant lesions.  Psych:  Alert and cooperative. Flat affect, worried  Intake/Output from previous day: 04/06 0701 - 04/07 0700 In: 2850 [P.O.:960; I.V.:1890] Out: 3100 [Urine:3100] Intake/Output this shift:    Lab Results:  Recent Labs  06/24/12 1830 06/26/12 0550  WBC 4.6 4.0  HGB 8.5* 9.0*  HCT 28.1* 29.5*  PLT 223 210   BMET  Recent Labs  06/24/12 1830 06/26/12 0550  NA 131* 140  K 3.1* 3.6  CL 93* 104  CO2 26 28  GLUCOSE 95 95  BUN 4* <3*  CREATININE 0.66 0.51  CALCIUM 9.1 9.1   LFT  Recent Labs  06/24/12 1830 06/26/12 0550  PROT 6.5 6.3  ALBUMIN 3.7 3.4*  AST 25 20  ALT 10 7  ALKPHOS 146* 136*  BILITOT 0.2* 0.3    Studies/Results: Dg Abd Acute W/chest  06/24/2012  *RADIOLOGY REPORT*  Clinical Data: Increased  abdominal pain since Thursday.  Passing blood since Monday.  History of surgery 11 months ago to remove portion of the stomach and small bowel.  ACUTE ABDOMEN SERIES (ABDOMEN 2 VIEW & CHEST 1 VIEW)  Comparison: 10/14/2011  Findings: Heart size is normal.  The lungs are free of focal consolidations and pleural effusions.  The aorta is tortuous.  Surgical clips are identified in the right mid abdomen.  Bowel gas pattern is nonobstructive. Contrast is present in the colon following recent CT exam.  Colonic loops do not appear distended. No free intraperitoneal air beneath diaphragm.  Patient has had previous mid thoracic vertebral plasty.  IMPRESSION:  1. No evidence for acute cardiopulmonary abnormality. 2.  Nonobstructive bowel gas pattern.   Original Report Authenticated By: Norva Pavlov, M.D.     Assessment: 57 year old female with history of PUD secondary to NSAIDs and historical duodenal stricture, s/p gastrectomy with Roux-en-Y, gastrojejunostomy, presenting with profuse diarrhea, colitis via CT, and EGD 4/6 with bleeding anastomotic ulcer s/p epi. Hgb improved. Need to rule out H.pylori as culprit of ulcer. Cdiff and other stool studies ordered but no further loose stools since admission. Continued epigastric pain, receiving IV Fentanyl approximately every 3 hours. Abdominal exam without evidence of acute issue; question of underlying chronic abdominal pain although she does have real ulcerative disease. No nausea.    Plan: Continue PPI infusion for now (4/9 at noon would be total of 72 hours) Carafate Cipro/Flagyl. Obtain stool studies as ordered  if further diarrhea Supportive measures, pain control Pancreatic enzymes when tolerating po Outpatient colonoscopy Mildly elevated alk phos, fatty liver: f/u with Dr. Darrick Penna as outpatient H.pylori serologies now  Nira Retort, ANP-BC [facility]    LOS: 2 days    06/26/2012, 7:56 AM

## 2012-06-26 NOTE — Progress Notes (Signed)
     Subjective: This lady was admitted once again with abdominal pain, hematemesis and diarrhea. Her hemoglobin has dropped, and I stabilizing. She has had no further diarrhea while she has been in the hospital. Apparently there is no in any further vomiting. She does still have pain. We have not seen her in approximately one year or so. Yesterday Dr. Kendell Bane performed EGD and found a bleeding anastomotic ulcer. The bleeding was controlled. Her hemoglobin is stabilized. She still continues to be in pain.           Physical Exam: Blood pressure 147/86, pulse 72, temperature 98.3 F (36.8 C), temperature source Oral, resp. rate 18, height 5\' 4"  (1.626 m), weight 50.4 kg (111 lb 1.8 oz), SpO2 95.00%. Appears to be in pain. Abdomen is soft and guarded on palpation by hand. Bowel sounds are present and normal. She remains alert and orientated. She does not look toxic or septic.   Investigations:     Basic Metabolic Panel:  Recent Labs  16/10/96 1830 06/26/12 0550  NA 131* 140  K 3.1* 3.6  CL 93* 104  CO2 26 28  GLUCOSE 95 95  BUN 4* <3*  CREATININE 0.66 0.51  CALCIUM 9.1 9.1  MG 1.8  --    Liver Function Tests:  Recent Labs  06/24/12 1830 06/26/12 0550  AST 25 20  ALT 10 7  ALKPHOS 146* 136*  BILITOT 0.2* 0.3  PROT 6.5 6.3  ALBUMIN 3.7 3.4*     CBC:  Recent Labs  06/24/12 1830 06/26/12 0550  WBC 4.6 4.0  NEUTROABS 2.1  --   HGB 8.5* 9.0*  HCT 28.1* 29.5*  MCV 75.1* 75.3*  PLT 223 210    Dg Abd Acute W/chest  06/24/2012  *RADIOLOGY REPORT*  Clinical Data: Increased abdominal pain since Thursday.  Passing blood since Monday.  History of surgery 11 months ago to remove portion of the stomach and small bowel.  ACUTE ABDOMEN SERIES (ABDOMEN 2 VIEW & CHEST 1 VIEW)  Comparison: 10/14/2011  Findings: Heart size is normal.  The lungs are free of focal consolidations and pleural effusions.  The aorta is tortuous.  Surgical clips are identified in the right mid  abdomen.  Bowel gas pattern is nonobstructive. Contrast is present in the colon following recent CT exam.  Colonic loops do not appear distended. No free intraperitoneal air beneath diaphragm.  Patient has had previous mid thoracic vertebral plasty.  IMPRESSION:  1. No evidence for acute cardiopulmonary abnormality. 2.  Nonobstructive bowel gas pattern.   Original Report Authenticated By: Norva Pavlov, M.D.       Medications: I have reviewed the patient's current medications.  Impression: 1. Abdominal pain, likely secondary to bleeding an anastomotic ulcer, status post epinephrine injections. Although this lady does and did have pathology, once again her physical findings seem to be out of proportion to her symptoms. 2. Microcytic Anemia with stabilizing hemoglobin. Iron deficiency anemia 3. History of weight loss.      Plan: 1. Continue with intravenous antibiotics for the time being. 2. Change intravenous fentanyl to intravenous morphine. She says that the morphine helps the pain better but last time she got a headache. We will try morphine again. Increase Ativan as required, there seems to be a functional/psychological component to her symptoms despite her having real pathology.     LOS: 2 days   Wilson Singer Pager [phone]  06/26/2012, 10:19 AM

## 2012-06-26 NOTE — Progress Notes (Signed)
INITIAL NUTRITION ASSESSMENT  DOCUMENTATION CODES Per approved criteria  -Non-severe (moderate) malnutrition in the context of chronic illness   INTERVENTION:  Resource Breeze po TID, each supplement provides 250 kcal and 9 grams of protein. ProStat 30 ml TID (each 30 ml provides 100 kcal, 15 gr protein)  NUTRITION DIAGNOSIS: Malnutrition related inadequte oral intake as evidenced by PMH, chronic abdominal pain, wt loss, diet recall and muscle loss and fat depletion  Goal: Pt to meet >/= 90% of their estimated nutrition needs  Monitor:  Po intake, labs and wt trends  Reason for Assessment: Malnurtriton Screen  57 y.o. female  Admitting Dx: Colitis  ASSESSMENT:  RD assessed pt on August 30, 2011 her wt at that time 131#. Current wt reflects unplanned 20#, 15% wt loss in 10 months; trending toward significant. Reports pain and poor tolerance of clear liquids this a.m. and very little po intake since last Monday.  She is s/p EGD and bleeding anastomotic ulcer identified.   Her hx  includes but not limited to: chronic abdominal pain, pancreatitis, partial gastrectomy and common bile duct repair, duodenal ulcer and rheumatoid arthritis.  Pt meets criteria for moderate malnutrition  In the context of chronic illness given her energy intake of <75% for >/= 1 month mild orbital and temporal muscle and fat loss.  Height: Ht Readings from Last 1 Encounters:  06/24/12 5\' 4"  (1.626 m)    Weight: Wt Readings from Last 1 Encounters:  06/24/12 111 lb 1.8 oz (50.4 kg)    Ideal Body Weight: 120# (54.5 kg)  % Ideal Body Weight: 93%  Wt Readings from Last 10 Encounters:  06/24/12 111 lb 1.8 oz (50.4 kg)  06/24/12 111 lb 1.8 oz (50.4 kg)  06/22/12 102 lb 12.8 oz (46.63 kg)  10/14/11 125 lb (56.7 kg)  10/13/11 120 lb (54.432 kg)  10/08/11 125 lb (56.7 kg)  09/19/11 125 lb (56.7 kg)  09/16/11 125 lb (56.7 kg)  08/29/11 131 lb (59.421 kg)  08/24/11 128 lb (58.06 kg)    Usual  Body Weight: 120-125#  % Usual Body Weight: 93%  BMI:  Body mass index is 19.06 kg/(m^2). Normal range  Estimated Nutritional Needs: Kcal: 1500-1750 Protein: 75-85 gr Fluid: >1800 ml/day  Skin: No issues noted  Diet Order: Clear Liquid  EDUCATION NEEDS: -Education needs addressed   Intake/Output Summary (Last 24 hours) at 06/26/12 1001 Last data filed at 06/26/12 0900  Gross per 24 hour  Intake   2850 ml  Output   2450 ml  Net    400 ml    Last BM: 06/24/12  Labs:   Recent Labs Lab 06/22/12 1548 06/24/12 1830 06/26/12 0550  NA 133* 131* 140  K 3.5 3.1* 3.6  CL 95* 93* 104  CO2 28 26 28   BUN 5* 4* <3*  CREATININE 0.57 0.66 0.51  CALCIUM 9.7 9.1 9.1  MG  --  1.8  --   GLUCOSE 86 95 95    CBG (last 3)  No results found for this basename: GLUCAP,  in the last 72 hours  Scheduled Meds: . ciprofloxacin  400 mg Intravenous Q12H  . metronidazole  500 mg Intravenous Q8H  . potassium chloride  40 mEq Oral Daily  . sucralfate  1 g Oral TID WC & HS    Continuous Infusions: . dextrose 5 % and 0.9% NaCl 125 mL/hr at 06/26/12 0917  . pantoprozole (PROTONIX) infusion 8 mg/hr (06/26/12 1610)    Past Medical History  Diagnosis Date  .  RA (rheumatoid arthritis)  2008  . MI (myocardial infarction) 2008    Not well documented, patient reports reassuring cardiac catheterization and stress testing in [place]  . Constipation 03/21/2011  . Hypokalemia 03/21/2011  . Fatty liver 03/21/2011  . Pancreatitis     states 3 years ago, very severe, ?biliary pancreatitis   . T12 compression fracture 2011  . Duodenal ulcer     remote per patient. +BC powders, patient report negative EGD six months ago  . Dilated pancreatic duct     ?chronic pancreatitis, EUS pending (06/02/11)  . Kidney stones   . Chronic abdominal pain     Past Surgical History  Procedure Laterality Date  . Cholecystectomy    . Knee surgery    . Appendectomy    . Complete hysterectomy    . Ventral  wall hernia    . Esophagogastroduodenoscopy  08/2010    Dr. Samuella Cota, [place]. small hh, mild prepylori gastritis. path unavailable  . Esophagogastroduodenoscopy  04/2010    Dr. Samuella Cota, [place]. small hh, mild distal esophagitis, antral gastritis and duodenitis. Bx mild chronic gastritis and no H.pylori  . Esophagogastroduodenoscopy  08/2009    Dr. Allena Katz, [place]. Moderate gastritis, moderate duodenitis with nodularity in proximal duodenal bulb, bx chronic gastritis, no helicobacter, mild chronic duodenitis  . Esophagogastroduodenoscopy  02/2007    Dr. Samuella Cota, [place]. antral gastritis and duodenal ulcers. Bx mild chronic gastritis. No bx from duodenal ulcer available.  Gaspar Bidding dilation  04/06/2011    Procedure: SAVORY DILATION;  Surgeon: Arlyce Harman, MD;  Location: [facility];  Service: Endoscopy;  Laterality: N/A;  16 mm  . Esophagogastroduodenoscopy  04/06/11    Dr. Darrick Penna: 1-2 cm hiatal hernia, mod gastritis, 41mmX3mm linear ulcer in duodenal bulb, stricture 1st part of duodenum, dilated to 12 mm  . Esophagogastroduodenoscopy  05/12/11    partial gastric oulet obstruction secondary to stricture between bulb/2nd portion of duodenum with marked friability and inflammation but no discrete ulcer, dilated stomach. s/p dilation but high risk for restenosis  . Esophagogastroduodenoscopy  06/22/2011    Procedure: ESOPHAGOGASTRODUODENOSCOPY (EGD);  Surgeon: West Bali, MD;  Location: [facility];  Service: Endoscopy;  Laterality: N/A;  . Gastrojejunostomy  06/28/2011    Procedure: GASTROJEJUNOSTOMY;  Surgeon: Fabio Bering, MD;  Location: [facility];  Service: General;  Laterality: N/A;  Roux-en-y, Choledocalduodenostomy  . Abdominal hysterectomy    . Kidney stone surgery      Royann Shivers MS,RD,LDN,CSG Office: [phone] Pager: [phone]

## 2012-06-27 DIAGNOSIS — K920 Hematemesis: Secondary | ICD-10-CM

## 2012-06-27 LAB — COMPREHENSIVE METABOLIC PANEL
ALT: 7 U/L (ref 0–35)
Alkaline Phosphatase: 124 U/L — ABNORMAL HIGH (ref 39–117)
BUN: <3 mg/dL — ABNORMAL LOW (ref 6–23)
Chloride: 106 mEq/L (ref 96–112)
GFR calc Af Amer: >90 mL/min (ref >90)
Glucose, Bld: 91 mg/dL (ref 70–99)
Potassium: 3.5 mEq/L (ref 3.5–5.1)
Sodium: 140 mEq/L (ref 135–145)
Total Bilirubin: 0.2 mg/dL — ABNORMAL LOW (ref 0.3–1.2)
Total Protein: 5.5 g/dL — ABNORMAL LOW (ref 6.0–8.3)

## 2012-06-27 LAB — CBC
HCT: 28.4 % — ABNORMAL LOW (ref 36.0–46.0)
Hemoglobin: 8.5 g/dL — ABNORMAL LOW (ref 12.0–15.0)
RBC: 3.72 MIL/uL — ABNORMAL LOW (ref 3.87–5.11)
WBC: 5.1 10*3/uL (ref 4.0–10.5)

## 2012-06-27 LAB — H. PYLORI ANTIBODY, IGG: H Pylori IgG: <0.4 {ISR}

## 2012-06-27 MED ORDER — METRONIDAZOLE 500 MG PO TABS
500.0000 mg | ORAL_TABLET | Freq: Three times a day (TID) | ORAL | Status: DC
  Administered 2012-06-27 – 2012-06-29 (×7): 500 mg via ORAL
  Filled 2012-06-27 (×7): qty 1

## 2012-06-27 MED ORDER — CIPROFLOXACIN HCL 250 MG PO TABS
500.0000 mg | ORAL_TABLET | Freq: Two times a day (BID) | ORAL | Status: DC
  Administered 2012-06-27 – 2012-06-29 (×4): 500 mg via ORAL
  Filled 2012-06-27 (×4): qty 2

## 2012-06-27 NOTE — Care Management Note (Addendum)
    Page 1 of 1   06/28/2012     2:06:17 PM   CARE MANAGEMENT NOTE 06/28/2012  Patient:  Jordan Haney, Jordan Haney   Account Number:  [id number]  Date Initiated:  06/27/2012  Documentation initiated by:  Rosemary Holms  Subjective/Objective Assessment:   Pt admitted from home where she lives with her finance. Plan to DC with possible HH. Previously used [facility] in [place]     Action/Plan:   Anticipated DC Date:  06/28/2012   Anticipated DC Plan:  HOME/SELF CARE      DC Planning Services  CM consult      Choice offered to / List presented to:             Status of service:  Completed, signed off Medicare Important Message given?   (If response is "NO", the following Medicare IM given date fields will be blank) Date Medicare IM given:   Date Additional Medicare IM given:    Discharge Disposition:  HOME/SELF CARE  Per UR Regulation:    If discussed at Long Length of Stay Meetings, dates discussed:    Comments:  06/28/12 Rosemary Holms RN BSN CM Spoke w/ pt. No HH/DME needs identified.  06/27/12 Lyam Provencio Leanord Hawking RN BSNCM

## 2012-06-27 NOTE — Progress Notes (Signed)
REMINDER MADE 

## 2012-06-27 NOTE — Progress Notes (Signed)
Utilization Review Complete  

## 2012-06-27 NOTE — Progress Notes (Deleted)
     Subjective: This lady was admitted once again with abdominal pain, hematemesis and diarrhea. Her hemoglobin has dropped, and  stabilizing. She has had no further diarrhea while she has been in the hospital. Apparently there is no in any further vomiting. She does still have pain, which is improving since I started her on intravenous morphine when necessary. Dr. Kendell Bane performed EGD and found a bleeding anastomotic ulcer. The bleeding was controlled. Her hemoglobin is stabilized. She still continues to be in pain, but less so.Marland Kitchen           Physical Exam: Blood pressure 118/73, pulse 72, temperature 97.7 F (36.5 C), temperature source Oral, resp. rate 18, height 5\' 4"  (1.626 m), weight 50.4 kg (111 lb 1.8 oz), SpO2 94.00%. Appears to be in pain. Abdomen is soft and guarded on palpation by hand. Bowel sounds are present and normal. She remains alert and orientated. She does not look toxic or septic.   Investigations:     Basic Metabolic Panel:  Recent Labs  45/40/98 1830 06/26/12 0550 06/27/12 0606  NA 131* 140 140  K 3.1* 3.6 3.5  CL 93* 104 106  CO2 26 28 27   GLUCOSE 95 95 91  BUN 4* <3* <3*  CREATININE 0.66 0.51 0.52  CALCIUM 9.1 9.1 8.7  MG 1.8  --   --    Liver Function Tests:  Recent Labs  06/26/12 0550 06/27/12 0606  AST 20 17  ALT 7 7  ALKPHOS 136* 124*  BILITOT 0.3 0.2*  PROT 6.3 5.5*  ALBUMIN 3.4* 2.9*     CBC:  Recent Labs  06/24/12 1830 06/26/12 0550 06/27/12 0606  WBC 4.6 4.0 5.1  NEUTROABS 2.1  --   --   HGB 8.5* 9.0* 8.5*  HCT 28.1* 29.5* 28.4*  MCV 75.1* 75.3* 76.3*  PLT 223 210 184    No results found.    Medications: I have reviewed the patient's current medications.  Impression: 1. Abdominal pain, likely secondary to bleeding an anastomotic ulcer, status post epinephrine injections. Improving. Although this lady does and did have pathology, once again her physical findings seem to be out of proportion to her  symptoms. 2. Microcytic Anemia with stabilizing hemoglobin. Iron deficiency anemia 3. History of weight loss.      Plan: 1. Continue with current therapy except we can reduce IV fluids. Encourage oral intake. Advance diet. 2. Mobilize.      LOS: 3 days   Wilson Singer Pager [phone]  06/27/2012, 8:53 AM

## 2012-06-27 NOTE — Telephone Encounter (Signed)
REVIEWED.  

## 2012-06-27 NOTE — Progress Notes (Signed)
     Subjective: This lady was admitted once again with abdominal pain, hematemesis and diarrhea. Her hemoglobin has dropped, and  stabilizing. She has had no further diarrhea while she has been in the hospital. Apparently there is no in any further vomiting. She does still have pain, which is improving since I started her on intravenous morphine when necessary. Dr. Kendell Bane performed EGD and found a bleeding anastomotic ulcer. The bleeding was controlled. Her hemoglobin is stabilized. She still continues to be in pain, but less so.Jordan Haney           Physical Exam: Blood pressure 118/73, pulse 72, temperature 97.7 F (36.5 C), temperature source Oral, resp. rate 18, height 5\' 4"  (1.626 m), weight 50.4 kg (111 lb 1.8 oz), SpO2 94.00%. Appears to be in pain. Abdomen is soft and guarded on palpation by hand. Bowel sounds are present and normal. She remains alert and orientated. She does not look toxic or septic.   Investigations:     Basic Metabolic Panel:  Recent Labs  84/69/62 1830 06/26/12 0550 06/27/12 0606  NA 131* 140 140  K 3.1* 3.6 3.5  CL 93* 104 106  CO2 26 28 27   GLUCOSE 95 95 91  BUN 4* <3* <3*  CREATININE 0.66 0.51 0.52  CALCIUM 9.1 9.1 8.7  MG 1.8  --   --    Liver Function Tests:  Recent Labs  06/26/12 0550 06/27/12 0606  AST 20 17  ALT 7 7  ALKPHOS 136* 124*  BILITOT 0.3 0.2*  PROT 6.3 5.5*  ALBUMIN 3.4* 2.9*     CBC:  Recent Labs  06/24/12 1830 06/26/12 0550 06/27/12 0606  WBC 4.6 4.0 5.1  NEUTROABS 2.1  --   --   HGB 8.5* 9.0* 8.5*  HCT 28.1* 29.5* 28.4*  MCV 75.1* 75.3* 76.3*  PLT 223 210 184    No results found.    Medications: I have reviewed the patient's current medications.  Impression: 1. Abdominal pain, likely secondary to bleeding an anastomotic ulcer, status post epinephrine injections. Improving. Although this lady does and did have pathology, once again her physical findings seem to be out of proportion to her  symptoms. 2. Microcytic Anemia with stabilizing hemoglobin. Iron deficiency anemia 3. History of weight loss.      Plan: 1. Continue with current therapy except we can reduce IV fluids and change antibiotics to oral. Encourage oral intake. Advance diet. 2. Mobilize.      LOS: 3 days   Wilson Singer Pager [phone]  06/27/2012, 8:56 AM

## 2012-06-27 NOTE — Progress Notes (Signed)
Subjective: Improving abdominal pain, +nausea no vomiting. Feels better "with that new medicine". Morphine 4 mg every 3 hours.   Objective: Vital signs in last 24 hours: Temp:  [97.7 F (36.5 C)-99.4 F (37.4 C)] 97.7 F (36.5 C) (04/08 0510) Pulse Rate:  [72-85] 72 (04/08 0510) Resp:  [18-19] 18 (04/08 0510) BP: (109-146)/(73-96) 118/73 mmHg (04/08 0510) SpO2:  [94 %-96 %] 94 % (04/08 0510) Last BM Date: 06/24/12 General:   Drowsy, flat affect, no distress.  Head:  Normocephalic and atraumatic. Eyes:  No icterus, sclera clear. Conjuctiva pink.  Mouth:  Without lesions, mucosa pink and moist.  Heart:  S1, S2 present, no murmurs noted.  Lungs: Clear to auscultation bilaterally, without wheezing, rales, or rhonchi.  Abdomen:  Bowel sounds present, soft, only mild TTP upper abdomen, much improved from prior exam yesterday, non-distended. No HSM or hernias noted. No rebound or guarding. No masses appreciated  Msk:  Symmetrical without gross deformities. Normal posture. Extremities:  Without clubbing or edema. Neurologic:  Alert and  oriented x4;  grossly normal neurologically. Skin:  Warm and dry, intact without significant lesions.   Intake/Output from previous day: 04/07 0701 - 04/08 0700 In: 2180 [P.O.:480; I.V.:1400; IV Piggyback:300] Out: 800 [Urine:800] Intake/Output this shift:    Lab Results:  Recent Labs  06/24/12 1830 06/26/12 0550 06/27/12 0606  WBC 4.6 4.0 5.1  HGB 8.5* 9.0* 8.5*  HCT 28.1* 29.5* 28.4*  PLT 223 210 184   BMET  Recent Labs  06/24/12 1830 06/26/12 0550 06/27/12 0606  NA 131* 140 140  K 3.1* 3.6 3.5  CL 93* 104 106  CO2 26 28 27   GLUCOSE 95 95 91  BUN 4* <3* <3*  CREATININE 0.66 0.51 0.52  CALCIUM 9.1 9.1 8.7   LFT  Recent Labs  06/24/12 1830 06/26/12 0550 06/27/12 0606  PROT 6.5 6.3 5.5*  ALBUMIN 3.7 3.4* 2.9*  AST 25 20 17   ALT 10 7 7   ALKPHOS 146* 136* 124*  BILITOT 0.2* 0.3 0.2*     Assessment: 57 year old female  with 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 Hgb with slight drift but overall stable, likely dilutional. No overt GI bleeding noted. Diarrhea has ceased. H.pylori serology in process. Clinically, patient has improved with regimen of Morphine instead of Fentanyl as previously prescribed. Likely a component of underlying chronic abdominal pain in the setting of known real PUD. With her history of gastrojejunostomy, may be prone to ulcer formation at the anastomosis, barring any other etiology such as H.pylori, less likely Zollinger-Ellison.   Plan: Continue PPI infusion (tomorrow at noon transition to po BID) Carafate Stool studies if further diarrhea Supportive measures Pancreatic enzymes when tolerating po (history of possible pancreatic insufficiency) Outpatient colonoscopy with Dr. Darrick Penna, follow-up as outpatient due to history of fatty liver, elevated alk phos   Nira Retort, ANP-BC [facility]    LOS: 3 days    06/27/2012, 7:50 AM

## 2012-06-27 NOTE — Progress Notes (Signed)
REVIEWED.  

## 2012-06-28 ENCOUNTER — Inpatient Hospital Stay (HOSPITAL_COMMUNITY)

## 2012-06-28 LAB — CBC
HCT: 28.1 % — ABNORMAL LOW (ref 36.0–46.0)
MCHC: 30.2 g/dL (ref 30.0–36.0)
MCV: 76.6 fL — ABNORMAL LOW (ref 78.0–100.0)
Platelets: 185 10*3/uL (ref 150–400)
RDW: 15.4 % (ref 11.5–15.5)
WBC: 4.4 10*3/uL (ref 4.0–10.5)

## 2012-06-28 LAB — BASIC METABOLIC PANEL
BUN: 3 mg/dL — ABNORMAL LOW (ref 6–23)
Chloride: 105 mEq/L (ref 96–112)
Creatinine, Ser: 0.56 mg/dL (ref 0.50–1.10)
GFR calc Af Amer: >90 mL/min (ref >90)
GFR calc non Af Amer: >90 mL/min (ref >90)
Potassium: 4.5 mEq/L (ref 3.5–5.1)

## 2012-06-28 MED ORDER — IOHEXOL 300 MG/ML  SOLN
50.0000 mL | INTRAMUSCULAR | Status: AC
  Administered 2012-06-28 (×2): 50 mL via ORAL

## 2012-06-28 MED ORDER — IOHEXOL 300 MG/ML  SOLN
100.0000 mL | Freq: Once | INTRAMUSCULAR | Status: AC | PRN
  Administered 2012-06-28: 100 mL via INTRAVENOUS

## 2012-06-28 NOTE — Progress Notes (Signed)
Subjective: Patient noting new pain, RLQ, states "something is wrong", twisting/turning in bed. Wants more pain medication. States "this is different than the ulcer pain". +nausea, no vomiting. No BM since the 6th. Refusing miralax.   Objective: Vital signs in last 24 hours: Temp:  [97.4 F (36.3 C)-98.6 F (37 C)] 97.4 F (36.3 C) (04/09 0631) Pulse Rate:  [72-78] 72 (04/09 0631) Resp:  [16-18] 16 (04/09 0631) BP: (111-118)/(69-76) 118/74 mmHg (04/09 0631) SpO2:  [96 %-100 %] 100 % (04/09 0631) Last BM Date: 06/25/12 General:   Alert and oriented, pained Head:  Normocephalic and atraumatic. Eyes:  No icterus, sclera clear. Conjuctiva pink.  Mouth:  Without lesions, mucosa pink and moist.  Heart:  S1, S2 present, no murmurs noted.  Lungs: Clear to auscultation bilaterally, without wheezing, rales, or rhonchi.  Abdomen:  Bowel sounds present, soft, point tenderness RLQ, right over prior surgical scar, anticipates palpation and immediately winces before palpation, non-distended.  Msk:  Symmetrical without gross deformities. Normal posture. Extremities:  Without clubbing or edema. Neurologic:  Alert and  oriented x4;  grossly normal neurologically. Skin:  Warm and dry, intact without significant lesions.  Psych:  Anxious, worried, "something is wrong"  Intake/Output from previous day: 04/08 0701 - 04/09 0700 In: 3001.6 [P.O.:360; I.V.:2241.6; IV Piggyback:400] Out: 600 [Urine:600] Intake/Output this shift:    Lab Results:  Recent Labs  06/26/12 0550 06/27/12 0606 06/28/12 0548  WBC 4.0 5.1 4.4  HGB 9.0* 8.5* 8.5*  HCT 29.5* 28.4* 28.1*  PLT 210 184 185   BMET  Recent Labs  06/26/12 0550 06/27/12 0606 06/28/12 0548  NA 140 140 140  K 3.6 3.5 4.5  CL 104 106 105  CO2 28 27 29   GLUCOSE 95 91 101*  BUN <3* <3* 3*  CREATININE 0.51 0.52 0.56  CALCIUM 9.1 8.7 9.2   LFT  Recent Labs  06/26/12 0550 06/27/12 0606  PROT 6.3 5.5*  ALBUMIN 3.4* 2.9*  AST 20 17   ALT 7 7  ALKPHOS 136* 124*  BILITOT 0.3 0.2*    Studies/Results: CT 4/3: Wall thickening and inflammatory change of the transverse colon,  descending colon, and sigmoid colon is suspected. Differential  diagnosis includes inflammatory process such as pseudomembranous  colitis or infection. Also consider inflammatory bowel disease and  ischemia.  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 Hgb stable. No overt GI bleeding noted. Diarrhea has ceased. H.pylori serology normal.  Significant change in presentation of patient this morning; notes worsening, persistent RLQ pain, point tenderness noted. No BM since admission, but she is refusing to take anything. States "something is wrong". Prior history includes appendectomy, complete abdominal hysterectomy. No fever, chills, +nausea but no vomiting. Abdominal exam without distension, no peritoneal signs, vitals and labs are stable. She is quite tender RLQ, but she winces and "braces" herself before palpation. I doubt an acute underlying pathology, and I discussed adding dose of Miralax today. She adamantly refuses this. CT 4/3 as above. Due to her significant pain, will repeat CT today. I question an underlying component of chronic abdominal pain.      Plan: Transition to Protonix po today, stop infusion around noon, continue at BID Carafate Pancreatic enzymes when tolerating po  Repeat CT now  Outpatient colonoscopy with Dr. Darrick Penna, follow-up as outpatient due to  history of fatty liver, elevated alk phos   Nira Retort, ANP-BC [facility]astroenter[facility]     LOS: 4 days    06/28/2012, 7:56 AM  Patient seen and examined earlier today. Agree with repeat CT now

## 2012-06-28 NOTE — Progress Notes (Signed)
     Subjective: This lady was admitted once again with abdominal pain, hematemesis and diarrhea. Her hemoglobin has dropped, and  stabilizing. She has had no further diarrhea while she has been in the hospital. Apparently there is no in any further vomiting. She does still have pain, which is improving since I started her on intravenous morphine when necessary. Dr. Kendell Bane performed EGD and found a bleeding anastomotic ulcer. The bleeding was controlled. Her hemoglobin is stabilized.  Today she appears to complain of pain in the right lower quadrant, different to the pain that she presented with in the epigastric area. She has been evaluated by gastroenterology and a CT scan of the abdomen has been ordered.           Physical Exam: Blood pressure 118/74, pulse 72, temperature 97.4 F (36.3 C), temperature source Oral, resp. rate 16, height 5\' 4"  (1.626 m), weight 50.4 kg (111 lb 1.8 oz), SpO2 100.00%. Appears to be in pain. Abdomen is soft and guarded on palpation by hand, especially in the right lower quadrant. Bowel sounds are present and normal. She remains alert and orientated. She does not look toxic or septic.   Investigations:     Basic Metabolic Panel:  Recent Labs  08/65/78 0606 06/28/12 0548  NA 140 140  K 3.5 4.5  CL 106 105  CO2 27 29  GLUCOSE 91 101*  BUN <3* 3*  CREATININE 0.52 0.56  CALCIUM 8.7 9.2   Liver Function Tests:  Recent Labs  06/26/12 0550 06/27/12 0606  AST 20 17  ALT 7 7  ALKPHOS 136* 124*  BILITOT 0.3 0.2*  PROT 6.3 5.5*  ALBUMIN 3.4* 2.9*     CBC:  Recent Labs  06/27/12 0606 06/28/12 0548  WBC 5.1 4.4  HGB 8.5* 8.5*  HCT 28.4* 28.1*  MCV 76.3* 76.6*  PLT 184 185    No results found.    Medications: I have reviewed the patient's current medications.  Impression: 1. Abdominal pain, likely secondary to bleeding an anastomotic ulcer, status post epinephrine injections. Improving. Although this lady does and did have  pathology, once again her physical findings seem to be out of proportion to her symptoms. 2. New right lower quadrant abdominal pain, unclear etiology. 3. Microcytic Anemia with stabilizing hemoglobin. Iron deficiency anemia       Plan: 1. Await CT scan of the abdomen.      LOS: 4 days   Wilson Singer Pager [phone]  06/28/2012, 10:25 AM

## 2012-06-29 ENCOUNTER — Encounter (HOSPITAL_COMMUNITY): Payer: Self-pay | Admitting: Gastroenterology

## 2012-06-29 DIAGNOSIS — R197 Diarrhea, unspecified: Secondary | ICD-10-CM

## 2012-06-29 DIAGNOSIS — K5289 Other specified noninfective gastroenteritis and colitis: Secondary | ICD-10-CM

## 2012-06-29 DIAGNOSIS — K284 Chronic or unspecified gastrojejunal ulcer with hemorrhage: Secondary | ICD-10-CM

## 2012-06-29 DIAGNOSIS — R7989 Other specified abnormal findings of blood chemistry: Secondary | ICD-10-CM

## 2012-06-29 DIAGNOSIS — R1013 Epigastric pain: Secondary | ICD-10-CM

## 2012-06-29 LAB — CBC
MCHC: 30.4 g/dL (ref 30.0–36.0)
RDW: 15.3 % (ref 11.5–15.5)
WBC: 4.2 10*3/uL (ref 4.0–10.5)

## 2012-06-29 LAB — CLOSTRIDIUM DIFFICILE BY PCR: Toxigenic C. Difficile by PCR: NEGATIVE

## 2012-06-29 MED ORDER — OXYCODONE HCL 5 MG PO TABS: 5.0000 mg | ORAL_TABLET | ORAL | Status: DC | PRN

## 2012-06-29 MED ORDER — PANCRELIPASE (LIP-PROT-AMYL) 12000-38000 UNITS PO CPEP: 3.0000 | ORAL_CAPSULE | Freq: Three times a day (TID) | ORAL | Status: DC

## 2012-06-29 MED ORDER — PANTOPRAZOLE SODIUM 40 MG PO TBEC
40.0000 mg | DELAYED_RELEASE_TABLET | Freq: Two times a day (BID) | ORAL | Status: DC
  Administered 2012-06-29: 40 mg via ORAL
  Filled 2012-06-29: qty 1

## 2012-06-29 MED ORDER — FOLIC ACID 1 MG PO TABS: 1.0000 mg | ORAL_TABLET | Freq: Every day | ORAL | Status: DC

## 2012-06-29 MED ORDER — SUCRALFATE 1 GM/10ML PO SUSP: 1.0000 g | Freq: Three times a day (TID) | ORAL | Status: DC

## 2012-06-29 MED ORDER — FERROUS SULFATE 325 (65 FE) MG PO TABS: 325.0000 mg | ORAL_TABLET | Freq: Every day | ORAL | Status: DC

## 2012-06-29 MED ORDER — LEVOTHYROXINE SODIUM 25 MCG PO TABS: 25.0000 ug | ORAL_TABLET | Freq: Every day | ORAL | Status: DC

## 2012-06-29 MED ORDER — LEVOTHYROXINE SODIUM 25 MCG PO TABS
25.0000 ug | ORAL_TABLET | Freq: Every day | ORAL | Status: DC
  Administered 2012-06-29: 25 ug via ORAL
  Filled 2012-06-29: qty 1

## 2012-06-29 MED ORDER — PANCRELIPASE (LIP-PROT-AMYL) 12000-38000 UNITS PO CPEP
3.0000 | ORAL_CAPSULE | Freq: Three times a day (TID) | ORAL | Status: DC
  Administered 2012-06-29: 3 via ORAL
  Filled 2012-06-29: qty 3

## 2012-06-29 NOTE — Progress Notes (Signed)
Saline lock removed. Discharge instructions given. Prescriptions given. Pt verbalized understanding of instructions. Pt awaiting for family to arrive for discharge.

## 2012-06-29 NOTE — Discharge Summary (Signed)
Physician Discharge Summary  CAMYLA CAMPOSANO UJW:[id number] DOB: November 14, 1955 DOA: 06/24/2012  PCP: Anson [place], DO  Admit date: 06/24/2012 Discharge date: 06/29/2012  Time spent: Greater than 30 minutes  Recommendations for Outpatient Follow-up:  1. Followup with primary care physician in 4-6 weeks. 2. Follow with gastroenterology, Dr. Kendell Bane in 6 weeks.   Discharge Diagnoses:  1. 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 2. Microcytic anemia, iron deficiency secondary to #1. 3. Folate deficiency. 4. Hypothyroidism, newly diagnosed. 5. Fatty liver. 6. Abnormal LFTs likely secondary to #5. 7. Degree of pancreatic insufficiency.   Discharge Condition: Stable and improved.  Diet recommendation: As tolerated, regular.  Filed Weights   06/24/12 1814 06/24/12 2248  Weight: 47.628 kg (105 lb) 50.4 kg (111 lb 1.8 oz)    History of present illness:  This unfortunate 57 year old lady presented to the hospital with symptoms vomiting, hematemesis, abdominal pain and diarrhea. Please see initial history as outlined below: HPI: JANNETH KRASNER is a 57 y.o. 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 Prior to this surgery she had multiple ED visits and admissions with chronic abdominal pain, a 50 lbs weight loss and there had been concern about misuse of pain medications/pseudoaddiction. Her last admission and ED visit at [facility] was almost a year ago and she tells me that she has been doing fairly well at least from a system utilization perspective with much relief of pain and symptoms after surgery. She however tells me that she has never fully regained her strength, her weight has not improved  and she suffers from a managable amount of chronic upper GI pain-she weighs 105 lbs and her prior healthy weight was around 155 lbs. In addition to her Gi issues, she has a history of possible RA for which she was taking Humira over a year ago, osteoporosis with compression fractures, fatty liver and HTN.  About 1 weeks ago she started having a recurrence of her abdominal pain with associated nausea, vomiting and hematemesis. The hematemesis started after several episodes of "dry heaving" and was noted to be streaking on a washcloth. She also had associated epigastric pain with radiation of pain to her back and odynophagia. She reports developing a fever of 100.1 and joint pain. She came in for evaluation this evening on the advice of Dr. Darrick Penna after developing worsening lower abdominal pain that was 10/10 and profuse diarrhea, fecal urgency with cramping -a CT obtained as an outpatient showed Colitis. She describes the diarrhea as initially light colored, almost white and with mucous,and then it became darker and more formed, with urgency and bowel incontinence. No obvious blood.  In the ED her Hb was 8.5 down from 9.4 2 days ago in Dr. Darrick Penna office and she has severe abdominal pain despite IV opiates and has evidence of dehydration and hypokalemia. She is being admitted for further GI evaluation and management.  Hospital Course:  The patient was admitted and treated symptomatically. She was put on a Protonix infusion. She underwent endoscopy by Dr. Kendell Bane who found the above findings of bleeding an anastomotic ulcer. This was treated with local epinephrine. The bleeding has stopped. She, post procedure was put on Carafate and continued to have some abdominal pain although somewhat improved. Then she began to have right lower  quadrant abdominal pain. A CT scan of the abdomen was done and this was completely normal. This morning she is much improved and wishes to go home. She's been able to tolerate a diet.  She was found on this admission to be hypothyroid with a TSH over 6, have parameters indicative of iron and folate deficiency. She'll be sent home on these supplementations as well as Creon. She will followup with her primary care physician in the next 6 weeks for repeat TSH and also with Dr. Kendell Bane in 6 weeks for repeat endoscopy.  Procedures:  EGD with Dr. Kendell Bane:  FINDINGS: Normal esophagus. Surgically altered stomach with  anastomosis to small intestine noted. Couple of protruding sutures  at the anastomosis. Patent efferent limb. Patient had an oozing 1  cm anastomotic ulcer as depicted in the above photos.  THERAPEUTIC / DIAGNOSTIC MANEUVERS PERFORMED: The bleeding ulcer  was treated with a total of 4 cc of 1-10,000 epinephrine injected  submucosally into the ulcer base margin. Subsequently, it was  sealed with several applications of the Gold probe at 20 J each.  COMPLICATIONS: None  IMPRESSION: Bleeding anastomotic ulcer-status post bleeding  control therapy as outlined above  RECOMMENDATIONS: PPI via constant intravenous infusion. Clear  liquid diet. Add Carafate . Follow H&H.  Consultations:  Gastroenterology, Dr. Kendell Bane.  Discharge Exam: Filed Vitals:   06/28/12 0631 06/28/12 1400 06/28/12 2108 06/29/12 0423  BP: 118/74 133/86 124/82 114/77  Pulse: 72 88 76 80  Temp: 97.4 F (36.3 C) 97.8 F (36.6 C) 98.3 F (36.8 C) 98.2 F (36.8 C)  TempSrc: Oral Oral Oral Oral  Resp: 16 16 17 16   Height:      Weight:      SpO2: 100% 100% 96% 96%    General: She looks somewhat chronically sick but not acutely sick now. She is slightly pale. Cardiovascular: Heart sounds are present and normal without murmurs. Respiratory: Lung fields are clear. Abdomen is soft and still somewhat tender subjectively but much improved than yesterday. She is alert and orientated  Discharge Instructions  Discharge Orders   Future Orders Complete By Expires     Diet - low sodium heart healthy   As directed     Increase activity slowly  As directed         Medication List    STOP taking these medications       amLODipine 5 MG tablet  Commonly known as:  NORVASC     ciprofloxacin 500 MG tablet  Commonly known as:  CIPRO     HYDROcodone-acetaminophen 5-325 MG per tablet  Commonly known as:  NORCO/VICODIN     metroNIDAZOLE 500 MG tablet  Commonly known as:  FLAGYL      TAKE these medications       ALPRAZolam 0.5 MG tablet  Commonly known as:  XANAX  Take 0.5 mg by mouth 2 (two) times daily.     ferrous sulfate 325 (65 FE) MG tablet  Commonly known as:  FERROUSUL  Take 1 tablet (325 mg total) by mouth daily with breakfast.     folic acid 1 MG tablet  Commonly known as:  FOLVITE  Take 1 tablet (1 mg total) by mouth daily.     HUMIRA 40 MG/0.8ML injection  Generic drug:  adalimumab  Inject 40 mg into the skin every 7 (seven) days.     levothyroxine 25 MCG tablet  Commonly known as:  SYNTHROID, LEVOTHROID  Take 1 tablet (25 mcg total) by mouth daily  before breakfast.     lipase/protease/amylase 16109 UNITS Cpep  Commonly known as:  CREON-10/PANCREASE  Take 3 capsules by mouth 3 (three) times daily before meals.     ondansetron 4 MG tablet  Commonly known as:  ZOFRAN  1 PO 30 MINS PRIOR TO MEALS AND AT BEDTIME     oxyCODONE 5 MG immediate release tablet  Commonly known as:  ROXICODONE  Take 1 tablet (5 mg total) by mouth every 4 (four) hours as needed for pain.     pantoprazole 40 MG tablet  Commonly known as:  PROTONIX  1 PO 30 MINS PRIOR TO MEALS BID     sucralfate 1 GM/10ML suspension  Commonly known as:  CARAFATE  Take 10 mLs (1 g total) by mouth 4 (four) times daily -  with meals and at bedtime.     zolpidem 5 MG tablet  Commonly known as:  AMBIEN  Take 5 mg by mouth at bedtime as needed for sleep.           Follow-up Information   Follow up with Eula Listen, MD In 6 weeks.   Contact information:   [address] [place] [address] [phone]        The results of significant diagnostics from this hospitalization (including imaging, microbiology, ancillary and laboratory) are listed below for reference.    Significant Diagnostic Studies: Ct Abdomen Pelvis W Contrast  06/28/2012  *RADIOLOGY REPORT*  Clinical Data: Right lower quadrant abdominal pain.  CT ABDOMEN AND PELVIS WITH CONTRAST  Technique:  Multidetector CT imaging of the abdomen and pelvis was performed following the standard protocol during bolus administration of intravenous contrast.  Contrast: 1 OMNIPAQUE IOHEXOL 300 MG/ML  SOLN, OMNIPAQUE IOHEXOL 300 MG/ML  SOLN  Comparison: CT of the abdomen and pelvis 06/22/2012.  Findings:  Lung Bases: A small amount of subsegmental atelectasis in the lung bases bilaterally.  Otherwise, unremarkable.  Abdomen/Pelvis:  Status post cholecystectomy.  Diffusely decreased attenuation throughout the hepatic parenchyma, suggestive of hepatic steatosis.  No focal cystic or solid hepatic lesions.  The appearance of the pancreas, spleen, bilateral adrenal glands and the right kidney is unremarkable.  In the upper pole of the left kidney there is a well-defined low attenuation lesion with no enhancement measuring 16 mm in diameter, compatible with a simple cyst (similar to prior).  There is no significant volume of ascites, no pneumoperitoneum and no pathologic distension of small bowel.  No definite pathologic lymphadenopathy identified within the abdomen or pelvis.  Urinary bladder is unremarkable in appearance.  Status post hysterectomy. Ovaries are not confidently identified and may be surgically absent or atrophic.  The appendix is surgically absent.  Musculoskeletal: There are no aggressive appearing lytic or blastic lesions noted in the visualized portions of the skeleton.  IMPRESSION: 1.  No acute findings in the abdomen or pelvis to account for the patient's symptoms. 2.  Status post appendectomy,  hysterectomy and cholecystectomy. 3.  Decreased attenuation in the hepatic parenchyma suggestive of hepatic steatosis. 4.  1.6 cm simple cyst in the upper pole of the left kidney.   Original Report Authenticated By: Trudie Reed, M.D.    Ct Abdomen Pelvis W Contrast  06/22/2012  *RADIOLOGY REPORT*  Clinical Data: Abdominal pain  CT ABDOMEN AND PELVIS WITH CONTRAST  Technique:  Multidetector CT imaging of the abdomen and pelvis was performed following the standard protocol during bolus administration of intravenous contrast.  Contrast: OMNIPAQUE  IOHEXOL 300 MG/ML  SOLN  Comparison: 08/29/2011  Findings: Diffuse hepatic steatosis.  Postoperative changes from gastrojejunostomy are again noted.  Soft tissue adjacent to the anastomosis has improved.  Ring enhancing abnormality anterior to the left lobe of the liver has also resolved.  Spleen, adrenal glands, are within normal limits.  The biliary dilatation has improved.  Pancreatic duct is stable in caliber. Simple cyst in the left kidney.  Otherwise kidneys are within normal limits.  No disproportionate dilatation of bowel to suggest obstruction.  No extraluminal bowel gas.  The transverse colon is decompressed however the wall is ill- defined and hazy.  This process continues into the descending colon and sigmoid colon.  No free fluid.  No obvious abnormal adenopathy.  Bladder is within normal limits.  The appendix is not clearly defined.  Stable T12 compression fracture with retropulsion of the superior endplate.  IMPRESSION: Wall thickening and inflammatory change of the transverse colon, descending colon, and sigmoid colon is suspected.  Differential diagnosis includes inflammatory process such as pseudomembranous colitis or infection.  Also consider inflammatory bowel disease and ischemia.  Improved postoperative changes as described.   Original Report Authenticated By: Jolaine Click, M.D.    Dg Abd Acute W/chest  06/24/2012  *RADIOLOGY REPORT*   Clinical Data: Increased abdominal pain since Thursday.  Passing blood since Monday.  History of surgery 11 months ago to remove portion of the stomach and small bowel.  ACUTE ABDOMEN SERIES (ABDOMEN 2 VIEW & CHEST 1 VIEW)  Comparison: 10/14/2011  Findings: Heart size is normal.  The lungs are free of focal consolidations and pleural effusions.  The aorta is tortuous.  Surgical clips are identified in the right mid abdomen.  Bowel gas pattern is nonobstructive. Contrast is present in the colon following recent CT exam.  Colonic loops do not appear distended. No free intraperitoneal air beneath diaphragm.  Patient has had previous mid thoracic vertebral plasty.  IMPRESSION:  1. No evidence for acute cardiopulmonary abnormality. 2.  Nonobstructive bowel gas pattern.   Original Report Authenticated By: Norva Pavlov, M.D.     Microbiology: Recent Results (from the past 240 hour(s))  URINE CULTURE     Status: None   Collection Time    06/24/12  6:47 PM      Result Value Range Status   Specimen Description URINE, CLEAN CATCH   Final   Special Requests NONE   Final   Culture  Setup Time 06/25/2012 21:33   Final   Colony Count NO GROWTH   Final   Culture NO GROWTH   Final   Report Status 06/26/2012 FINAL   Final  CLOSTRIDIUM DIFFICILE BY PCR     Status: None   Collection Time    06/29/12  1:47 AM      Result Value Range Status   C difficile by pcr NEGATIVE  NEGATIVE Final     Labs: Basic Metabolic Panel:  Recent Labs Lab 06/22/12 1548 06/24/12 1830 06/26/12 0550 06/27/12 0606 06/28/12 0548  NA 133* 131* 140 140 140  K 3.5 3.1* 3.6 3.5 4.5  CL 95* 93* 104 106 105  CO2 28 26 28 27 29   GLUCOSE 86 95 95 91 101*  BUN 5* 4* <3* <3* 3*  CREATININE 0.57 0.66 0.51 0.52 0.56  CALCIUM 9.7 9.1 9.1 8.7 9.2  MG  --  1.8  --   --   --    Liver Function Tests:  Recent Labs Lab 06/22/12 1548 06/24/12 1830 06/26/12 0550  06/27/12 0606  AST 29 25 20 17   ALT 11 10 7 7   ALKPHOS 160* 146*  136* 124*  BILITOT 0.3 0.2* 0.3 0.2*  PROT 7.7 6.5 6.3 5.5*  ALBUMIN 4.2 3.7 3.4* 2.9*    Recent Labs Lab 06/22/12 1548 06/24/12 1830  LIPASE 18 17    CBC:  Recent Labs Lab 06/22/12 1548 06/24/12 1830 06/26/12 0550 06/27/12 0606 06/28/12 0548 06/29/12 0547  WBC 5.9 4.6 4.0 5.1 4.4 4.2  NEUTROABS 2.8 2.1  --   --   --   --   HGB 9.4* 8.5* 9.0* 8.5* 8.5* 8.4*  HCT 30.6* 28.1* 29.5* 28.4* 28.1* 27.6*  MCV 74.6* 75.1* 75.3* 76.3* 76.6* 75.0*  PLT 296 223 210 184 185 200         Signed:  GOSRANI,NIMISH C  [facility] 06/29/2012, 10:48 AM

## 2012-06-29 NOTE — Progress Notes (Signed)
Subjective:  2BMs yesterday, loose. On regular diet but really only drinking liquids. Complains of nausea and anorexia. No melena or rectal bleeding.  Objective: Vital signs in last 24 hours: Temp:  [97.8 F (36.6 C)-98.3 F (36.8 C)] 98.2 F (36.8 C) (04/10 0423) Pulse Rate:  [76-88] 80 (04/10 0423) Resp:  [16-17] 16 (04/10 0423) BP: (114-133)/(77-86) 114/77 mmHg (04/10 0423) SpO2:  [96 %-100 %] 96 % (04/10 0423) Last BM Date: 06/29/12 General:   Alert,  Well-developed, thin, pleasant and cooperative in NAD Head:  Normocephalic and atraumatic. Eyes:  Sclera clear, no icterus.   Abdomen:  Soft, moderate epigastric tenderness and nondistended. Normal bowel sounds, without guarding, and without rebound.   Extremities:  Without clubbing, deformity or edema. Neurologic:  Alert and  oriented x4;  grossly normal neurologically. Skin:  Intact without significant lesions or rashes. Psych:  Alert and cooperative. Normal mood and affect.  Intake/Output from previous day:   Intake/Output this shift:    Lab Results: CBC  Recent Labs  06/27/12 0606 06/28/12 0548 06/29/12 0547  WBC 5.1 4.4 4.2  HGB 8.5* 8.5* 8.4*  HCT 28.4* 28.1* 27.6*  MCV 76.3* 76.6* 75.0*  PLT 184 185 200   BMET  Recent Labs  06/27/12 0606 06/28/12 0548  NA 140 140  K 3.5 4.5  CL 106 105  CO2 27 29  GLUCOSE 91 101*  BUN <3* 3*  CREATININE 0.52 0.56  CALCIUM 8.7 9.2   LFTs  Recent Labs  06/27/12 0606  BILITOT 0.2*  ALKPHOS 124*  AST 17  ALT 7  PROT 5.5*  ALBUMIN 2.9*     Imaging Studies: Ct Abdomen Pelvis W Contrast  06/28/2012  *RADIOLOGY REPORT*  Clinical Data: Right lower quadrant abdominal pain.  CT ABDOMEN AND PELVIS WITH CONTRAST  Technique:  Multidetector CT imaging of the abdomen and pelvis was performed following the standard protocol during bolus administration of intravenous contrast.  Contrast: 1 OMNIPAQUE IOHEXOL 300 MG/ML  SOLN, OMNIPAQUE IOHEXOL 300 MG/ML  SOLN   Comparison: CT of the abdomen and pelvis 06/22/2012.  Findings:  Lung Bases: A small amount of subsegmental atelectasis in the lung bases bilaterally.  Otherwise, unremarkable.  Abdomen/Pelvis:  Status post cholecystectomy.  Diffusely decreased attenuation throughout the hepatic parenchyma, suggestive of hepatic steatosis.  No focal cystic or solid hepatic lesions.  The appearance of the pancreas, spleen, bilateral adrenal glands and the right kidney is unremarkable.  In the upper pole of the left kidney there is a well-defined low attenuation lesion with no enhancement measuring 16 mm in diameter, compatible with a simple cyst (similar to prior).  There is no significant volume of ascites, no pneumoperitoneum and no pathologic distension of small bowel.  No definite pathologic lymphadenopathy identified within the abdomen or pelvis.  Urinary bladder is unremarkable in appearance.  Status post hysterectomy. Ovaries are not confidently identified and may be surgically absent or atrophic.  The appendix is surgically absent.  Musculoskeletal: There are no aggressive appearing lytic or blastic lesions noted in the visualized portions of the skeleton.  IMPRESSION: 1.  No acute findings in the abdomen or pelvis to account for the patient's symptoms. 2.  Status post appendectomy, hysterectomy and cholecystectomy. 3.  Decreased attenuation in the hepatic parenchyma suggestive of hepatic steatosis. 4.  1.6 cm simple cyst in the upper pole of the left kidney.   Original Report Authenticated By: Trudie Reed, M.D.    Ct Abdomen Pelvis W Contrast  06/22/2012  *RADIOLOGY REPORT*  Clinical Data: Abdominal pain  CT ABDOMEN AND PELVIS WITH CONTRAST  Technique:  Multidetector CT imaging of the abdomen and pelvis was performed following the standard protocol during bolus administration of intravenous contrast.  Contrast: OMNIPAQUE IOHEXOL 300 MG/ML  SOLN  Comparison: 08/29/2011  Findings: Diffuse hepatic steatosis.   Postoperative changes from gastrojejunostomy are again noted.  Soft tissue adjacent to the anastomosis has improved.  Ring enhancing abnormality anterior to the left lobe of the liver has also resolved.  Spleen, adrenal glands, are within normal limits.  The biliary dilatation has improved.  Pancreatic duct is stable in caliber. Simple cyst in the left kidney.  Otherwise kidneys are within normal limits.  No disproportionate dilatation of bowel to suggest obstruction.  No extraluminal bowel gas.  The transverse colon is decompressed however the wall is ill- defined and hazy.  This process continues into the descending colon and sigmoid colon.  No free fluid.  No obvious abnormal adenopathy.  Bladder is within normal limits.  The appendix is not clearly defined.  Stable T12 compression fracture with retropulsion of the superior endplate.  IMPRESSION: Wall thickening and inflammatory change of the transverse colon, descending colon, and sigmoid colon is suspected.  Differential diagnosis includes inflammatory process such as pseudomembranous colitis or infection.  Also consider inflammatory bowel disease and ischemia.  Improved postoperative changes as described.   Original Report Authenticated By: Jolaine Click, M.D.    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 EGD 4/6 with bleeding anastomotic ulcer s/p epi. H.Pylori serologies are normal. No overt GI bleeding. H/H stable. Evidence of IDA. C.Diff PCR negative.  Plan: 1. Outpatient colonoscopy with Dr. Darrick Penna, f/u outpatient OV due to h/o fatty liver, elevated AP. 2. Complete course of Cipro/Flagyl. 3. Add pancreatic enzymes 4. Followup on stool studies as available.    LOS: 5 days   Tana Coast  06/29/2012, 7:48 AM

## 2012-07-03 LAB — STOOL CULTURE

## 2012-07-10 ENCOUNTER — Encounter (HOSPITAL_COMMUNITY): Payer: Self-pay | Admitting: *Deleted

## 2012-07-10 ENCOUNTER — Telehealth: Payer: Self-pay

## 2012-07-10 ENCOUNTER — Encounter: Payer: Self-pay | Admitting: Emergency Medicine

## 2012-07-10 ENCOUNTER — Emergency Department (HOSPITAL_COMMUNITY)

## 2012-07-10 ENCOUNTER — Observation Stay (HOSPITAL_COMMUNITY)
Admission: EM | Admit: 2012-07-10 | Discharge: 2012-07-11 | Disposition: A | Discharge status: Home / Self Care | Attending: Internal Medicine | Admitting: Internal Medicine

## 2012-07-10 DIAGNOSIS — R7989 Other specified abnormal findings of blood chemistry: Secondary | ICD-10-CM | POA: Diagnosis present

## 2012-07-10 DIAGNOSIS — R197 Diarrhea, unspecified: Secondary | ICD-10-CM

## 2012-07-10 DIAGNOSIS — E876 Hypokalemia: Secondary | ICD-10-CM | POA: Diagnosis present

## 2012-07-10 DIAGNOSIS — M810 Age-related osteoporosis without current pathological fracture: Secondary | ICD-10-CM

## 2012-07-10 DIAGNOSIS — M069 Rheumatoid arthritis, unspecified: Secondary | ICD-10-CM

## 2012-07-10 DIAGNOSIS — K760 Fatty (change of) liver, not elsewhere classified: Secondary | ICD-10-CM

## 2012-07-10 DIAGNOSIS — E860 Dehydration: Secondary | ICD-10-CM

## 2012-07-10 DIAGNOSIS — E039 Hypothyroidism, unspecified: Secondary | ICD-10-CM | POA: Insufficient documentation

## 2012-07-10 DIAGNOSIS — R195 Other fecal abnormalities: Secondary | ICD-10-CM

## 2012-07-10 DIAGNOSIS — R112 Nausea with vomiting, unspecified: Principal | ICD-10-CM | POA: Diagnosis present

## 2012-07-10 DIAGNOSIS — R945 Abnormal results of liver function studies: Secondary | ICD-10-CM | POA: Diagnosis present

## 2012-07-10 DIAGNOSIS — K920 Hematemesis: Secondary | ICD-10-CM

## 2012-07-10 DIAGNOSIS — R1013 Epigastric pain: Secondary | ICD-10-CM | POA: Diagnosis present

## 2012-07-10 DIAGNOSIS — R109 Unspecified abdominal pain: Secondary | ICD-10-CM

## 2012-07-10 DIAGNOSIS — K8689 Other specified diseases of pancreas: Secondary | ICD-10-CM | POA: Insufficient documentation

## 2012-07-10 DIAGNOSIS — K279 Peptic ulcer, site unspecified, unspecified as acute or chronic, without hemorrhage or perforation: Secondary | ICD-10-CM

## 2012-07-10 DIAGNOSIS — D649 Anemia, unspecified: Secondary | ICD-10-CM | POA: Diagnosis present

## 2012-07-10 DIAGNOSIS — K529 Noninfective gastroenteritis and colitis, unspecified: Secondary | ICD-10-CM

## 2012-07-10 DIAGNOSIS — K315 Obstruction of duodenum: Secondary | ICD-10-CM

## 2012-07-10 DIAGNOSIS — R1310 Dysphagia, unspecified: Secondary | ICD-10-CM

## 2012-07-10 DIAGNOSIS — R634 Abnormal weight loss: Secondary | ICD-10-CM

## 2012-07-10 DIAGNOSIS — N390 Urinary tract infection, site not specified: Secondary | ICD-10-CM

## 2012-07-10 LAB — CBC WITH DIFFERENTIAL/PLATELET
Eosinophils Absolute: 0.2 10*3/uL (ref 0.0–0.7)
Eosinophils Relative: 5 % (ref 0–5)
Hemoglobin: 10 g/dL — ABNORMAL LOW (ref 12.0–15.0)
Lymphocytes Relative: 23 % (ref 12–46)
Lymphs Abs: 0.9 10*3/uL (ref 0.7–4.0)
MCH: 22.8 pg — ABNORMAL LOW (ref 26.0–34.0)
MCV: 74 fL — ABNORMAL LOW (ref 78.0–100.0)
Monocytes Relative: 9 % (ref 3–12)
Neutrophils Relative %: 62 % (ref 43–77)
Platelets: 296 10*3/uL (ref 150–400)
RBC: 4.38 MIL/uL (ref 3.87–5.11)
WBC: 3.8 10*3/uL — ABNORMAL LOW (ref 4.0–10.5)

## 2012-07-10 LAB — BASIC METABOLIC PANEL
BUN: 3 mg/dL — ABNORMAL LOW (ref 6–23)
CO2: 31 mEq/L (ref 19–32)
GFR calc non Af Amer: >90 mL/min (ref >90)
Glucose, Bld: 97 mg/dL (ref 70–99)
Potassium: 2.9 mEq/L — ABNORMAL LOW (ref 3.5–5.1)
Sodium: 143 mEq/L (ref 135–145)

## 2012-07-10 LAB — HEPATIC FUNCTION PANEL
AST: 22 U/L (ref 0–37)
Albumin: 3.7 g/dL (ref 3.5–5.2)
Alkaline Phosphatase: 143 U/L — ABNORMAL HIGH (ref 39–117)
Bilirubin, Direct: 0.1 mg/dL (ref 0.0–0.3)
Total Bilirubin: 0.3 mg/dL (ref 0.3–1.2)

## 2012-07-10 MED ORDER — HYDROMORPHONE HCL PF 1 MG/ML IJ SOLN: 1.0000 mg | Freq: Once | INTRAMUSCULAR | Status: DC

## 2012-07-10 MED ORDER — FENTANYL CITRATE 0.05 MG/ML IJ SOLN
INTRAMUSCULAR | Status: AC
  Administered 2012-07-10: 50 ug via INTRAVENOUS
  Filled 2012-07-10: qty 2

## 2012-07-10 MED ORDER — ONDANSETRON HCL 4 MG/2ML IJ SOLN
INTRAMUSCULAR | Status: AC
  Administered 2012-07-10: 4 mg via INTRAVENOUS
  Filled 2012-07-10: qty 2

## 2012-07-10 MED ORDER — ZOLPIDEM TARTRATE 5 MG PO TABS: 5.0000 mg | ORAL_TABLET | Freq: Every evening | ORAL | Status: DC | PRN

## 2012-07-10 MED ORDER — ONDANSETRON HCL 4 MG/2ML IJ SOLN: 4.0000 mg | Freq: Once | INTRAMUSCULAR | Status: DC

## 2012-07-10 MED ORDER — SODIUM CHLORIDE 0.9 % IV SOLN
INTRAVENOUS | Status: DC
  Administered 2012-07-10 – 2012-07-11 (×2): via INTRAVENOUS

## 2012-07-10 MED ORDER — ONDANSETRON HCL 4 MG/2ML IJ SOLN
4.0000 mg | Freq: Four times a day (QID) | INTRAMUSCULAR | Status: DC | PRN
  Administered 2012-07-10 – 2012-07-11 (×4): 4 mg via INTRAVENOUS
  Filled 2012-07-10 (×4): qty 2

## 2012-07-10 MED ORDER — FENTANYL CITRATE 0.05 MG/ML IJ SOLN
100.0000 ug | Freq: Once | INTRAMUSCULAR | Status: AC
  Administered 2012-07-10: 100 ug via INTRAVENOUS
  Filled 2012-07-10: qty 2

## 2012-07-10 MED ORDER — SUCRALFATE 1 GM/10ML PO SUSP
1.0000 g | Freq: Three times a day (TID) | ORAL | Status: DC
  Administered 2012-07-10 – 2012-07-11 (×5): 1 g via ORAL
  Filled 2012-07-10 (×5): qty 10

## 2012-07-10 MED ORDER — SODIUM CHLORIDE 0.9 % IJ SOLN
3.0000 mL | Freq: Two times a day (BID) | INTRAMUSCULAR | Status: DC
  Administered 2012-07-11: 3 mL via INTRAVENOUS

## 2012-07-10 MED ORDER — ALPRAZOLAM 0.5 MG PO TABS
0.5000 mg | ORAL_TABLET | Freq: Two times a day (BID) | ORAL | Status: DC
  Administered 2012-07-10 – 2012-07-11 (×2): 0.5 mg via ORAL
  Filled 2012-07-10 (×2): qty 1

## 2012-07-10 MED ORDER — ACETAMINOPHEN 650 MG RE SUPP: 650.0000 mg | Freq: Four times a day (QID) | RECTAL | Status: DC | PRN

## 2012-07-10 MED ORDER — FENTANYL CITRATE 0.05 MG/ML IJ SOLN: 50.0000 ug | Freq: Once | INTRAMUSCULAR | Status: AC

## 2012-07-10 MED ORDER — POTASSIUM CHLORIDE 10 MEQ/100ML IV SOLN
10.0000 meq | INTRAVENOUS | Status: AC
  Administered 2012-07-10 – 2012-07-11 (×4): 10 meq via INTRAVENOUS
  Filled 2012-07-10: qty 100
  Filled 2012-07-10: qty 300

## 2012-07-10 MED ORDER — PANTOPRAZOLE SODIUM 40 MG IV SOLR
40.0000 mg | INTRAVENOUS | Status: DC
  Administered 2012-07-10: 40 mg via INTRAVENOUS
  Filled 2012-07-10: qty 40

## 2012-07-10 MED ORDER — PANCRELIPASE (LIP-PROT-AMYL) 12000-38000 UNITS PO CPEP
3.0000 | ORAL_CAPSULE | Freq: Three times a day (TID) | ORAL | Status: DC
  Administered 2012-07-10 – 2012-07-11 (×4): 3 via ORAL
  Filled 2012-07-10 (×4): qty 3

## 2012-07-10 MED ORDER — ENOXAPARIN SODIUM 40 MG/0.4ML ~~LOC~~ SOLN
40.0000 mg | SUBCUTANEOUS | Status: DC
  Administered 2012-07-10: 40 mg via SUBCUTANEOUS
  Filled 2012-07-10: qty 0.4

## 2012-07-10 MED ORDER — FENTANYL CITRATE 0.05 MG/ML IJ SOLN
100.0000 ug | INTRAMUSCULAR | Status: DC | PRN
  Administered 2012-07-10 (×2): 100 ug via INTRAVENOUS
  Filled 2012-07-10 (×2): qty 2

## 2012-07-10 MED ORDER — FENTANYL CITRATE 0.05 MG/ML IJ SOLN
50.0000 ug | INTRAMUSCULAR | Status: DC | PRN
  Administered 2012-07-10 – 2012-07-11 (×10): 50 ug via INTRAVENOUS
  Filled 2012-07-10 (×10): qty 2

## 2012-07-10 MED ORDER — ACETAMINOPHEN 325 MG PO TABS: 650.0000 mg | ORAL_TABLET | Freq: Four times a day (QID) | ORAL | Status: DC | PRN

## 2012-07-10 MED ORDER — ONDANSETRON HCL 4 MG PO TABS: 4.0000 mg | ORAL_TABLET | Freq: Four times a day (QID) | ORAL | Status: DC | PRN

## 2012-07-10 MED ORDER — LEVOTHYROXINE SODIUM 25 MCG PO TABS
25.0000 ug | ORAL_TABLET | Freq: Every day | ORAL | Status: DC
  Administered 2012-07-11: 25 ug via ORAL
  Filled 2012-07-10: qty 1

## 2012-07-10 MED ORDER — ONDANSETRON HCL 4 MG/2ML IJ SOLN: 4.0000 mg | Freq: Once | INTRAMUSCULAR | Status: AC

## 2012-07-10 NOTE — Telephone Encounter (Signed)
REVIEWED.  

## 2012-07-10 NOTE — Telephone Encounter (Signed)
Verbal from Dr. Darrick Penna, Pt should go to the ED. I called and informed the pt.

## 2012-07-10 NOTE — ED Notes (Signed)
Pt wanted potassium PO. States the IV burns too bad. Advised pt will start it slower than usual. Pt still states " I would rather drink it'. edp aware. Advised pt per edp that she can not have anything by mouth at this time due to diagnosis. Pt agreed.

## 2012-07-10 NOTE — Telephone Encounter (Signed)
Pt called and said she had an EGD by RMR on 06/25/2012. She was OK for a few days. Then she started having some diarrhea and vomiting. She can hardly eat anything. She has not seen any blood in the emesis. This morning her epigastric pain is constant and at a 9 on a scale of 1-10. She can be reached at [phone].

## 2012-07-10 NOTE — ED Notes (Signed)
hospitalist NP in to see pt. Turned potassium down to 44ml/hr per NP request. Pt stated it was burning more. Pain 7 at thsi time

## 2012-07-10 NOTE — ED Notes (Signed)
Pt advised of ng tube. Pt started crying and states the last time they tried she could not tolerate it and did not want it. Pt polite. edp aware.

## 2012-07-10 NOTE — H&P (Signed)
[facility] History and Physical  Jordan Haney ZOX:[id number] DOB: 1955/05/30 DOA: 07/10/2012  Referring physician:  PCP: Anson [place], DO  Specialists:   Chief Complaint: abdominal pain/diarrhea/nause/vomiting  HPI: Jordan Haney is a 57 y.o. 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 Prior to this surgery she had multiple ED visits and admissions with chronic abdominal pain, a 50 lbs weight loss and there had been concern about misuse of pain medications/pseudoaddiction. Her last admission and ED visit at [facility] was 06/24/12 for abdominal pain, she underwent endoscopy by Dr. Kendell Bane finding bleeding anastomotic ulcer that was treated with local epinephrine. She reports that she was doing fine for a few day after discharge and the pain and diarrhea resumed. She states she has not been able to eat or drink. She reports nausea and vomiting but no bloody emesis. No BRBPR or melena. She reports taking her pain medicine with little relief. She describes pain as constant sharp epigastric area radiating to back. She denies cp palpitation, sob. She states there has been no change in any of her medications and denies being out of medications. Work up in ED yields abdominal xary with bowel gas pattern is somewhat nonspecific and may be due to constipation. Difficult to exclude a partial small bowel obstruction, potassium of 2.9, Hg 10.0. VSS. Pt reports pain 10/10 constantly. Given fentanyl in ED with fair results. [facility] asked to admit  Review of Systems: The patient denies  fever,  vision loss, decreased hearing, hoarseness, chest pain, syncope, dyspnea on exertion, peripheral edema, balance deficits, hemoptysis, melena, hematochezia, severe indigestion/heartburn, hematuria,  incontinence, genital sores, muscle weakness, suspicious skin lesions, transient blindness, difficulty walking, depression,  abnormal bleeding, enlarged lymph nodes, angioedema, and breast masses.   Past Medical History  Diagnosis Date  . RA (rheumatoid arthritis)  2008  . MI (myocardial infarction) 2008    Not well documented, patient reports reassuring cardiac catheterization and stress testing in [place]  . Constipation 03/21/2011  . Hypokalemia 03/21/2011  . Fatty liver 03/21/2011  . Pancreatitis     states 3 years ago, very severe, ?biliary pancreatitis   . T12 compression fracture 2011  . Duodenal ulcer     remote per patient. +BC powders, patient report negative EGD six months ago  . Dilated pancreatic duct     ?chronic pancreatitis, EUS pending (06/02/11)  . Kidney stones   . Chronic abdominal pain    Past Surgical History  Procedure Laterality Date  . Cholecystectomy    . Knee surgery    . Appendectomy    . Complete hysterectomy    . Ventral wall hernia    . Esophagogastroduodenoscopy  08/2010    Dr. Samuella Cota, Versed. small hh, mild prepylori gastritis. path unavailable  . Esophagogastroduodenoscopy  04/2010    Dr. Samuella Cota, Versed. small hh, mild distal esophagitis, antral gastritis and duodenitis. Bx mild chronic gastritis and no H.pylori  . Esophagogastroduodenoscopy  08/2009    Dr. Allena Katz, Versed. Moderate gastritis, moderate duodenitis with nodularity in proximal duodenal bulb, bx chronic gastritis, no helicobacter, mild chronic duodenitis  . Esophagogastroduodenoscopy  02/2007    Dr. Samuella Cota, Versed. antral gastritis and duodenal ulcers. Bx mild chronic gastritis. No bx from duodenal ulcer available.  Gaspar Bidding dilation  04/06/2011    Procedure: SAVORY DILATION;  Surgeon: Arlyce Harman,  MD;  Location: [facility];  Service: Endoscopy;  Laterality: N/A;  16 mm  . Esophagogastroduodenoscopy  04/06/11    Dr. Darrick Penna: 1-2 cm hiatal hernia, mod gastritis, 48mmX3mm linear ulcer in  duodenal bulb, stricture 1st part of duodenum, dilated to 12 mm  . Esophagogastroduodenoscopy  05/12/11    partial gastric oulet obstruction secondary to stricture between bulb/2nd portion of duodenum with marked friability and inflammation but no discrete ulcer, dilated stomach. s/p dilation but high risk for restenosis  . Esophagogastroduodenoscopy  06/22/2011    Procedure: ESOPHAGOGASTRODUODENOSCOPY (EGD);  Surgeon: West Bali, MD;  Location: [facility];  Service: Endoscopy;  Laterality: N/A;  . Gastrojejunostomy  06/28/2011    Procedure: GASTROJEJUNOSTOMY;  Surgeon: Fabio Bering, MD;  Location: [facility];  Service: General;  Laterality: N/A;  Roux-en-y, Choledocalduodenostomy  . Abdominal hysterectomy    . Kidney stone surgery    . Esophagogastroduodenoscopy Left 06/25/2012    Procedure: ESOPHAGOGASTRODUODENOSCOPY (EGD);  Surgeon: Corbin Ade, MD;  Location: [facility];  Service: Endoscopy;  Laterality: Left;  . Back surgery  2013   Social History:  reports that she has never smoked. She does not have any smokeless tobacco history on file. She reports that she does not drink alcohol or use illicit drugs.  Allergies  Allergen Reactions  . Compazine Anaphylaxis  . Promethazine Hcl Anaphylaxis  . Vistaril (Hydroxyzine Hcl) Other (See Comments)    Reaction: legs shake  . Benadryl (Diphenhydramine Hcl) Other (See Comments)    Pt states, "it makes my legs shake really bad"   . Gabapentin Other (See Comments)    Reactions: legs shake  . Latex Swelling    Family History  Problem Relation Age of Onset  . Colon cancer Neg Hx   . Liver cancer Neg Hx   . Breast cancer Neg Hx   . Inflammatory bowel disease Neg Hx   . Heart attack Mother      Prior to Admission medications   Medication Sig Start Date End Date Taking? Authorizing Provider  ALPRAZolam Prudy Feeler) 0.5 MG tablet Take 0.5 mg by mouth 2 (two) times daily.   Yes Historical Provider, MD  ferrous sulfate (FERROUSUL) 325 (65  FE) MG tablet Take 1 tablet (325 mg total) by mouth daily with breakfast. 06/29/12  Yes Nimish C Karilyn Cota, MD  folic acid (FOLVITE) 1 MG tablet Take 1 tablet (1 mg total) by mouth daily. 06/29/12  Yes Nimish Normajean Glasgow, MD  levothyroxine (SYNTHROID, LEVOTHROID) 25 MCG tablet Take 1 tablet (25 mcg total) by mouth daily before breakfast. 06/29/12  Yes Nimish C Gosrani, MD  lipase/protease/amylase (CREON-10/PANCREASE) 12000 UNITS CPEP Take 3 capsules by mouth 3 (three) times daily before meals. 06/29/12  Yes Nimish C Karilyn Cota, MD  ondansetron (ZOFRAN) 4 MG tablet Take 4 mg by mouth every 8 (eight) hours as needed for nausea. 06/22/12  Yes West Bali, MD  oxyCODONE (ROXICODONE) 5 MG immediate release tablet Take 1 tablet (5 mg total) by mouth every 4 (four) hours as needed for pain. 06/29/12  Yes Nimish Normajean Glasgow, MD  pantoprazole (PROTONIX) 40 MG tablet Take 40 mg by mouth 2 (two) times daily. 06/22/12  Yes West Bali, MD  sucralfate (CARAFATE) 1 GM/10ML suspension Take 10 mLs (1 g total) by mouth 4 (four) times daily -  with meals and at bedtime. 06/29/12  Yes Nimish Normajean Glasgow, MD  zolpidem (AMBIEN) 5 MG tablet Take 5 mg by mouth at bedtime as needed for sleep.  Yes Historical Provider, MD   Physical Exam: Filed Vitals:   07/10/12 1158 07/10/12 1309 07/10/12 1415 07/10/12 1512  BP: 168/117 153/94 165/98 136/90  Pulse: 114 78 86 73  Temp: 98.5 F (36.9 C)  98.7 F (37.1 C) 98.3 F (36.8 C)  TempSrc: Oral  Oral Oral  Resp: 20 18 20 18   Height: 5\' 4"  (1.626 m)     Weight: 47.628 kg (105 lb)     SpO2: 100%  99% 98%    General:  Thin pale obviously uncomfortable  Eyes: PERRL EOMI no scleral icterus  ENT: ears clear nose without drainage mucus membranes of mouth pink only slightly dry  Neck: supple no JVD full rom no lymphadenopathy  Cardiovascular: RRR No MGR No LE edema  Respiratory: normal effort somewhat shallow. Will hold breath. BS clear bilaterally no wheeze, no rhonchi  Abdomen:  flat sluggish BS very tender to palpation. +guarding  Skin: warm dry no rash no lesion  Musculoskeletal: no clubbing no cyanosis  Psychiatric: anxious, teary irritable  Neurologic: speech clear facial symmetry.   Labs on Admission:  Basic Metabolic Panel:  Recent Labs Lab 07/10/12 1230 07/10/12 1404  NA 143  --   K 2.9*  --   CL 104  --   CO2 31  --   GLUCOSE 97  --   BUN 3*  --   CREATININE 0.51  --   CALCIUM 9.2  --   MG  --  1.9   Liver Function Tests:  Recent Labs Lab 07/10/12 1256  AST 22  ALT 7  ALKPHOS 143*  BILITOT 0.3  PROT 6.7  ALBUMIN 3.7    Recent Labs Lab 07/10/12 1256  LIPASE 27   No results found for this basename: AMMONIA,  in the last 168 hours CBC:  Recent Labs Lab 07/10/12 1230  WBC 3.8*  NEUTROABS 2.4  HGB 10.0*  HCT 32.4*  MCV 74.0*  PLT 296   Cardiac Enzymes: No results found for this basename: CKTOTAL, CKMB, CKMBINDEX, TROPONINI,  in the last 168 hours  BNP (last 3 results) No results found for this basename: PROBNP,  in the last 8760 hours CBG: No results found for this basename: GLUCAP,  in the last 168 hours  Radiological Exams on Admission: Dg Abd Acute W/chest  07/10/2012  *RADIOLOGY REPORT*  Clinical Data: Mid abdominal pain and vomiting.  Recent ulcer repair.  ACUTE ABDOMEN SERIES (ABDOMEN 2 VIEW & CHEST 1 VIEW)  Comparison: CT abdomen pelvis 06/28/2012 and acute abdominal series 06/24/2012.  Findings: Frontal view of the chest shows midline trachea and normal heart size.  Descending thoracic aorta is somewhat tortuous. Biapical pleural parenchymal scarring.  Lungs are hyperinflated but clear.  No pleural fluid.  Mid thoracic vertebroplasty is noted.  Two views of the abdomen show gaseous distention of small bowel loops with gas and stool scattered in the colon.  Some associated air fluid levels.  Postoperative changes are scattered throughout the abdomen.  IMPRESSION: Bowel gas pattern is somewhat nonspecific and may  be due to constipation.  Difficult to exclude a partial small bowel obstruction.   Original Report Authenticated By: Leanna Battles, M.D.     EKG: Independently reviewed.   Assessment/Plan Active Problems:   Nausea and vomiting/epigastric pain: hx of same chronically. Etiology unclear. Xray ? Sbo. Pt refusing NG tube. Lipase 27.  Will admit to tele for observation. Will provide clear liquid diet as tolerated. Will provide anti emetic and pain med. Pt states she  has not run out of pain med but is a concern for withdrawal and/or seeking behavior. Will monitor closely. If no improvement will consider Ct and/or GI consult. However CT dated 06/28/12 no acute findings.   Diarrhea: etiology uncertain. Hx of same. Will check GI pathogen panel. monitor    Hypokalemia: likely related to decreased po intake due to #1 and #2.  Magnesium level 1.9. Will replete and recheck in am. Of note last hospitalization pt refused peripheral IV potassium and PICC ordered.     Epigastric pain: see #1. PPI     Anemia: hx of same.microcytic. Likely multifactorial. Chart review indicates baseline range 8-9. Currently 10.0 probably concentrated. No s/sx active bleeding. Will monitor closely. Continue iron  Abnormal LFT's. Hx of same. Chart review indicates currently at baseline.   HTN: controlled continue home norvasc.    Code Status: full Family Communication: none available Disposition Plan: home when ready  Time spent: 65 minutes  [facility]  If 7PM-7AM, please contact night-coverage [email or web address] Password [facility] 07/10/2012, 3:24 PM  Attending note:  Interviewed and examined. Previous chart reviewed. Patient known to me from previous. The patient's vomiting and diarrhea has resolved once opiates resumed. I suspect this may be opiate withdrawal. Patient does not appear terribly dehydrated. Her BUN is 3. Will replete potassium, observe overnight and hopefully discharge tomorrow. Patient's  abdominal exam is soft nontender when distracted.  Crista Curb, M.D.

## 2012-07-10 NOTE — ED Notes (Signed)
Jordan Haney [phone] [id number], sig other

## 2012-07-10 NOTE — ED Notes (Signed)
Dr. Jena Gauss cauterized ulcers x 2 wks ago.  C/o increased pain since.  C/o n/v/d since procedure also.

## 2012-07-10 NOTE — ED Provider Notes (Signed)
History  This chart was scribed for Jordan Shi, MD by Shari Heritage, ED Scribe. The patient was seen in room APA02/APA02. Patient's care was started at 1251.   CSN: [id number]  Arrival date & time 07/10/12  1123   First MD Initiated Contact with Patient 07/10/12 1251      Chief Complaint  Patient presents with  . Abdominal Pain    The history is provided by the patient. No language interpreter was used.    HPI Comments: Jordan Haney is a 57 y.o. female who presents to the Emergency Department complaining of worsening, constant abdominal pain since being discharged from the hospital after esophagogastroduodenoscopy and cautery on 06/25/2012. Patient says that she has had persistent pain since the surgery and pain medicines given have not been providing relief. There is associated nausea, vomiting and diarrhea. Patient states that she had nausea when she went to bed last night. She reports 3 episodes of vomiting this morning. Patient says that diarrhea has been persistent since the procedure. Patient denies blood in stool, fever, chills or any other symptoms at this time. She has a medical history of MI, pancreatitis, duodenal ulcer, kidney stones, rheumatoid arthritis and fatty liver.    Past Medical History  Diagnosis Date  . RA (rheumatoid arthritis)  2008  . MI (myocardial infarction) 2008    Not well documented, patient reports reassuring cardiac catheterization and stress testing in [place]  . Constipation 03/21/2011  . Hypokalemia 03/21/2011  . Fatty liver 03/21/2011  . Pancreatitis     states 3 years ago, very severe, ?biliary pancreatitis   . T12 compression fracture 2011  . Duodenal ulcer     remote per patient. +BC powders, patient report negative EGD six months ago  . Dilated pancreatic duct     ?chronic pancreatitis, EUS pending (06/02/11)  . Kidney stones   . Chronic abdominal pain     Past Surgical History  Procedure Laterality Date  . Cholecystectomy    .  Knee surgery    . Appendectomy    . Complete hysterectomy    . Ventral wall hernia    . Esophagogastroduodenoscopy  08/2010    Dr. Samuella Cota, Versed. small hh, mild prepylori gastritis. path unavailable  . Esophagogastroduodenoscopy  04/2010    Dr. Samuella Cota, Versed. small hh, mild distal esophagitis, antral gastritis and duodenitis. Bx mild chronic gastritis and no H.pylori  . Esophagogastroduodenoscopy  08/2009    Dr. Allena Katz, Versed. Moderate gastritis, moderate duodenitis with nodularity in proximal duodenal bulb, bx chronic gastritis, no helicobacter, mild chronic duodenitis  . Esophagogastroduodenoscopy  02/2007    Dr. Samuella Cota, Versed. antral gastritis and duodenal ulcers. Bx mild chronic gastritis. No bx from duodenal ulcer available.  Gaspar Bidding dilation  04/06/2011    Procedure: SAVORY DILATION;  Surgeon: Arlyce Harman, MD;  Location: [facility];  Service: Endoscopy;  Laterality: N/A;  16 mm  . Esophagogastroduodenoscopy  04/06/11    Dr. Darrick Penna: 1-2 cm hiatal hernia, mod gastritis, 60mmX3mm linear ulcer in duodenal bulb, stricture 1st part of duodenum, dilated to 12 mm  . Esophagogastroduodenoscopy  05/12/11    partial gastric oulet obstruction secondary to stricture between bulb/2nd portion of duodenum with marked friability and inflammation but no discrete ulcer, dilated stomach. s/p dilation but high risk for restenosis  . Esophagogastroduodenoscopy  06/22/2011    Procedure: ESOPHAGOGASTRODUODENOSCOPY (EGD);  Surgeon: West Bali, MD;  Location: [facility];  Service: Endoscopy;  Laterality: N/A;  . Gastrojejunostomy  06/28/2011  Procedure: GASTROJEJUNOSTOMY;  Surgeon: Fabio Bering, MD;  Location: [facility];  Service: General;  Laterality: N/A;  Roux-en-y, Choledocalduodenostomy  . Abdominal hysterectomy    . Kidney stone surgery    . Esophagogastroduodenoscopy Left 06/25/2012    Procedure: ESOPHAGOGASTRODUODENOSCOPY (EGD);  Surgeon: Corbin Ade, MD;  [facility]: [facility];  Service: Endoscopy;   Laterality: Left;  . Back surgery  2013    Family History  Problem Relation Age of Onset  . Colon cancer Neg Hx   . Liver cancer Neg Hx   . Breast cancer Neg Hx   . Inflammatory bowel disease Neg Hx   . Heart attack Mother     History  Substance Use Topics  . Smoking status: Never Smoker   . Smokeless tobacco: Not on file  . Alcohol Use: No    OB History   Grav Para Term Preterm Abortions TAB SAB Ect Mult Living                  Review of Systems A complete 10 system review of systems was obtained and all systems are negative except as noted in the HPI and PMH.   Allergies  Compazine; Promethazine hcl; Vistaril; Benadryl; Gabapentin; and Latex  Home Medications   Current Outpatient Rx  Name  Route  Sig  Dispense  Refill  . ALPRAZolam (XANAX) 0.5 MG tablet   Oral   Take 0.5 mg by mouth 2 (two) times daily.         . ferrous sulfate (FERROUSUL) 325 (65 FE) MG tablet   Oral   Take 1 tablet (325 mg total) by mouth daily with breakfast.   30 tablet   0   . folic acid (FOLVITE) 1 MG tablet   Oral   Take 1 tablet (1 mg total) by mouth daily.   30 tablet   0   . levothyroxine (SYNTHROID, LEVOTHROID) 25 MCG tablet   Oral   Take 1 tablet (25 mcg total) by mouth daily before breakfast.   30 tablet   0   . lipase/protease/amylase (CREON-10/PANCREASE) 12000 UNITS CPEP   Oral   Take 3 capsules by mouth 3 (three) times daily before meals.   270 capsule   0   . pantoprazole (PROTONIX) 40 MG tablet   Oral   Take 40 mg by mouth 2 (two) times daily.         . sucralfate (CARAFATE) 1 GM/10ML suspension   Oral   Take 10 mLs (1 g total) by mouth 4 (four) times daily -  with meals and at bedtime.   420 mL   0   . zolpidem (AMBIEN) 5 MG tablet   Oral   Take 5 mg by mouth at bedtime as needed for sleep.         Marland Kitchen acetaminophen (TYLENOL) 325 MG tablet   Oral   Take 2 tablets (650 mg total) by mouth every 6 (six) hours as needed.         . ondansetron  (ZOFRAN) 4 MG tablet   Oral   Take 1 tablet (4 mg total) by mouth every 6 (six) hours as needed for nausea.   20 tablet      . oxyCODONE (ROXICODONE) 5 MG immediate release tablet   Oral   Take 1 tablet (5 mg total) by mouth every 6 (six) hours as needed for pain.   10 tablet   0     Triage Vitals: BP 168/117  Pulse  114  Temp(Src) 98.5 F (36.9 C) (Oral)  Resp 20  Ht 5\' 4"  (1.626 m)  Wt 105 lb (47.628 kg)  BMI 18.01 kg/m2  SpO2 100%  Physical Exam  Nursing note and vitals reviewed. Constitutional: She is oriented to person, place, and time. She appears well-developed and well-nourished. No distress.  HENT:  Head: Normocephalic and atraumatic.  Eyes: Pupils are equal, round, and reactive to light.  Neck: Normal range of motion.  Cardiovascular: Normal rate and intact distal pulses.   Pulmonary/Chest: No respiratory distress.  Abdominal: Normal appearance. She exhibits no distension. There is tenderness. There is no rigidity, no rebound and no guarding.    Musculoskeletal: Normal range of motion.  Neurological: She is alert and oriented to person, place, and time. No cranial nerve deficit.  Skin: Skin is warm and dry. No rash noted.  Psychiatric: She has a normal mood and affect. Her behavior is normal.    ED Course  Procedures (including critical care time) DIAGNOSTIC STUDIES: Oxygen Saturation is 100% on room air, normal by my interpretation.    COORDINATION OF CARE: 1:00 PM- Patient informed of current plan for treatment and evaluation and agrees with plan at this time.      Labs Reviewed  CBC WITH DIFFERENTIAL - Abnormal; Notable for the following:    WBC 3.8 (*)    Hemoglobin 10.0 (*)    HCT 32.4 (*)    MCV 74.0 (*)    MCH 22.8 (*)    RDW 15.7 (*)    Basophils Relative 2 (*)    All other components within normal limits  BASIC METABOLIC PANEL - Abnormal; Notable for the following:    Potassium 2.9 (*)    BUN 3 (*)    All other components within  normal limits  HEPATIC FUNCTION PANEL - Abnormal; Notable for the following:    Alkaline Phosphatase 143 (*)    Indirect Bilirubin 0.2 (*)    All other components within normal limits  COMPREHENSIVE METABOLIC PANEL - Abnormal; Notable for the following:    Potassium 3.4 (*)    BUN <3 (*)    Creatinine, Ser 0.48 (*)    Albumin 3.3 (*)    Alkaline Phosphatase 134 (*)    All other components within normal limits  CBC - Abnormal; Notable for the following:    Hemoglobin 9.1 (*)    HCT 28.9 (*)    MCV 74.5 (*)    MCH 23.5 (*)    RDW 15.9 (*)    All other components within normal limits  LIPASE, BLOOD  MAGNESIUM    Dg Ugi W/small Bowel High Density  07/18/2012  *RADIOLOGY REPORT*  Clinical Data:Prior gastrojejunostomy.  Persistent abdominal pain. Vomiting.  Evaluate for small bowel adhesions.  UPPER GI W/ SMALL BOWEL HIGH DENSITY  Technique: Upper GI series performed with high density barium and effervescent agent. Thin barium also used. Subsequently, serial images of the small bowel were obtained including spot views of the terminal ileum.  Fluoroscopy Time: 2 minutes and 12 seconds  Comparison: Plain films of 07/10/2012 and CT of 06/28/2012.  Findings: Pre procedure scout film demonstrates a nonobstructive bowel gas pattern.  Surgical clips in the right upper quadrant and sutures in the left mid abdomen.  Double contrast upper GI images demonstrate surgical changes of gastrojejunostomy.  No filling defect or ulcer identified.  The proximal stomach is suboptimally evaluated, presumably well visualized on the patient's recent endoscopy.  Small follow-through images demonstrate mild proximal to  mid small bowel dilatation, at up to 4.4 cm.  This is most apparent on the initial images, and favored to be be transient.  The more distal small bowel is normal in caliber.  No delay in contrast passage.  Normal small bowel fold pattern.  Normal small bowel transit time, approximately 2 hours and 30 minutes.   Normal terminal ileum.  IMPRESSION:  1.  Postsurgical changes of gastrojejunostomy, without acute finding in the stomach. 2.  Proximal small bowel dilatation, mild.  This is most apparent on initial images and is favored to be transient.  Although a component of low grade obstruction cannot be excluded (given normal caliber of mid to distal small bowel loops) there is no evidence of high-grade obstruction or delayed contrast passage.   Original Report Authenticated By: Jeronimo Greaves, M.D.      1. Diarrhea   2. Abdominal  pain, other specified site   3. Hypokalemia   4. Nausea and vomiting   5. Epigastric pain   6. Abnormal LFTs   7. Anemia   8. Abdominal pain-PERI-INCISIONAL/UMBILICAL   9. Rheumatoid arthritis   10. Fatty liver   11. PUD (peptic ulcer disease)   12. Weight loss, abnormal   13. 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 Dilation of pancreatic duct   16. Dehydration   17. Odynophagia   18. UTI (lower urinary tract infection)   19. Colitis   20. Abnormal stool color   21. Osteoporosis, unspecified       MDM  I personally performed the services described in this documentation, which was scribed in my presence. The recorded information has been reviewed and considered.    Jordan Shi, MD 07/19/12 [phone]

## 2012-07-11 DIAGNOSIS — R109 Unspecified abdominal pain: Secondary | ICD-10-CM

## 2012-07-11 LAB — CBC
Hemoglobin: 9.1 g/dL — ABNORMAL LOW (ref 12.0–15.0)
MCH: 23.5 pg — ABNORMAL LOW (ref 26.0–34.0)
MCV: 74.5 fL — ABNORMAL LOW (ref 78.0–100.0)
Platelets: 266 10*3/uL (ref 150–400)
RBC: 3.88 MIL/uL (ref 3.87–5.11)
WBC: 4.8 10*3/uL (ref 4.0–10.5)

## 2012-07-11 LAB — COMPREHENSIVE METABOLIC PANEL
AST: 19 U/L (ref 0–37)
Albumin: 3.3 g/dL — ABNORMAL LOW (ref 3.5–5.2)
BUN: <3 mg/dL — ABNORMAL LOW (ref 6–23)
Calcium: 8.8 mg/dL (ref 8.4–10.5)
Creatinine, Ser: 0.48 mg/dL — ABNORMAL LOW (ref 0.50–1.10)
GFR calc non Af Amer: >90 mL/min (ref >90)

## 2012-07-11 MED ORDER — OXYCODONE HCL 5 MG PO TABS: 5.0000 mg | ORAL_TABLET | Freq: Four times a day (QID) | ORAL | Status: DC | PRN

## 2012-07-11 MED ORDER — ACETAMINOPHEN 325 MG PO TABS: 650.0000 mg | ORAL_TABLET | Freq: Four times a day (QID) | ORAL | Status: DC | PRN

## 2012-07-11 MED ORDER — BOOST / RESOURCE BREEZE PO LIQD
1.0000 | Freq: Three times a day (TID) | ORAL | Status: DC
  Administered 2012-07-11: 1 via ORAL

## 2012-07-11 MED ORDER — OXYCODONE HCL 5 MG PO TABS: 5.0000 mg | ORAL_TABLET | ORAL | Status: DC | PRN

## 2012-07-11 MED ORDER — ONDANSETRON HCL 4 MG PO TABS: 4.0000 mg | ORAL_TABLET | Freq: Four times a day (QID) | ORAL | Status: DC | PRN

## 2012-07-11 MED ORDER — POTASSIUM CHLORIDE CRYS ER 20 MEQ PO TBCR
40.0000 meq | EXTENDED_RELEASE_TABLET | Freq: Once | ORAL | Status: AC
  Administered 2012-07-11: 40 meq via ORAL
  Filled 2012-07-11: qty 2

## 2012-07-11 MED ORDER — PANTOPRAZOLE SODIUM 40 MG PO TBEC
40.0000 mg | DELAYED_RELEASE_TABLET | Freq: Two times a day (BID) | ORAL | Status: DC
  Administered 2012-07-11: 40 mg via ORAL
  Filled 2012-07-11: qty 1

## 2012-07-11 NOTE — Discharge Summary (Addendum)
Physician Discharge Summary  Jordan Haney ZOX:[id number] DOB: [birth year] DOA: 07/10/2012  PCP: Anson [place], DO  Admit date: 07/10/2012 Discharge date: 07/11/2012  Time spent: 35 minutes  Recommendations for Outpatient Follow-up:  1. Follow up with Dr. Darrick Penna 1 week.  Discharge Diagnoses:  Active Problems:   Nausea and vomiting   Hypokalemia   Epigastric pain, chronic   Anemia, chronic Hypothyroidism Pancreatic insufficiency History of anastomotic ulcer, EGD 2 weeks ago.  No evidence of ongoing bleed Chronic opiate use  Discharge Condition: stable  Diet recommendation: bland  Filed Weights   07/10/12 1158 07/10/12 1630  Weight: 47.628 kg (105 lb) 48.172 kg (106 lb 3.2 oz)    History of present illness:  Jordan Haney is a 57 y.o. 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 Prior to this surgery she had multiple ED visits and admissions with chronic abdominal pain, a 50 lbs weight loss and there had been concern about misuse of pain medications/pseudoaddiction. Her last admission and ED visit at [facility] was 06/24/12 for abdominal pain, she underwent endoscopy by Dr. Kendell Bane finding bleeding anastomotic ulcer that was treated with local epinephrine. She reported that she was doing fine for a few day after discharge on 4714 and the pain and diarrhea resumed. She stated she has not been able to eat or drink. She reported nausea and vomiting but no bloody emesis. No BRBPR or melena. She reported taking her pain medicine with little relief. She described pain as constant sharp epigastric area radiating to back. She denied cp palpitation, sob. She stated there had been no change in any of her medications and denied being out of medications. Work up in ED yielded  abdominal xary with bowel gas pattern is somewhat nonspecific and may be due to constipation. Difficult to exclude a partial small bowel obstruction, potassium of 2.9, Hg 10.0. VSS. Pt reported pain 10/10 constantly. Given fentanyl in ED with fair results. [facility] asked to admit   Hospital Course:  Nausea and vomiting/epigastric pain: Abdominal pain is chronic. Pt admitted for observation and repletion of potassium. Provided with pain medicine and anti emetic with fair relief. Provided with  clear liquids and tolerated well. No witnessed emesis. Pain somewhat improved. Diet advanced. There is a concern for withdrawal and/or drug seeking behavior. CT dated 06/28/12 no acute findings.    Reported diarrhea: None here. Suspect secondary to opiate withdrawal.  Hypokalemia: Secondary to above, corrected   Chronic Epigastric pain: Proton pump inhibitor, Carafate, opiates all continued. She is on Creon as an outpatient.  Anemia: hx of same.microcytic.  Chart review indicates baseline range 8-9. Within baseline range at discharge. No s/sx bleeding.   HTN: controlled during this hospitalization.     history of anastomotic ulcer or, just had EGD 2 weeks ago. No evidence of ongoing bleeding.  Pancreatic insufficiency  Procedures: none  Consultations:  none  Discharge Exam: Filed Vitals:   07/10/12 1558 07/10/12 1630 07/10/12 2044 07/11/12 0422  BP: 149/94 154/89 129/82 131/78  Pulse: 78 78 68 66  Temp:  97.6 F (36.4 C) 98.1 F (36.7 C) 98.1 F (36.7 C)  TempSrc:  Oral Oral Oral  Resp: 20  15 16   Height:  5\' 4"  (1.626 m)    Weight:  48.172 kg (106 lb 3.2 oz)    SpO2: 95%  98% 98% 98%    General: awake alert NAD Cardiovascular: RRR No MGR No LE edema Respiratory: normal effort BS clear bilaterally Abdomen: flat, +BS diffuse tenderness to palpation  Discharge Instructions   Future Appointments Provider Department Dept Phone   08/15/2012 11:30 AM [facility]  [facility] [phone]       Medication List    ASK your doctor about these medications       ALPRAZolam 0.5 MG tablet  Commonly known as:  XANAX  Take 0.5 mg by mouth 2 (two) times daily.     ferrous sulfate 325 (65 FE) MG tablet  Commonly known as:  FERROUSUL  Take 1 tablet (325 mg total) by mouth daily with breakfast.     folic acid 1 MG tablet  Commonly known as:  FOLVITE  Take 1 tablet (1 mg total) by mouth daily.     levothyroxine 25 MCG tablet  Commonly known as:  SYNTHROID, LEVOTHROID  Take 1 tablet (25 mcg total) by mouth daily before breakfast.     lipase/protease/amylase 62130 UNITS Cpep  Commonly known as:  CREON-10/PANCREASE  Take 3 capsules by mouth 3 (three) times daily before meals.     ondansetron 4 MG tablet  Commonly known as:  ZOFRAN  Take 4 mg by mouth every 8 (eight) hours as needed for nausea.     oxyCODONE 5 MG immediate release tablet  Commonly known as:  ROXICODONE  Take 1 tablet (5 mg total) by mouth every 4 (four) hours as needed for pain.     pantoprazole 40 MG tablet  Commonly known as:  PROTONIX  Take 40 mg by mouth 2 (two) times daily.     sucralfate 1 GM/10ML suspension  Commonly known as:  CARAFATE  Take 10 mLs (1 g total) by mouth 4 (four) times daily -  with meals and at bedtime.     zolpidem 5 MG tablet  Commonly known as:  AMBIEN  Take 5 mg by mouth at bedtime as needed for sleep.          The results of significant diagnostics from this hospitalization (including imaging, microbiology, ancillary and laboratory) are listed below for reference.    Significant Diagnostic Studies: Ct Abdomen Pelvis W Contrast  06/28/2012  *RADIOLOGY REPORT*  Clinical Data: Right lower quadrant abdominal pain.  CT ABDOMEN AND PELVIS WITH CONTRAST  Technique:  Multidetector CT imaging of the abdomen and pelvis was performed following the standard protocol during bolus administration of intravenous contrast.  Contrast: 1  OMNIPAQUE IOHEXOL 300 MG/ML  SOLN, OMNIPAQUE IOHEXOL 300 MG/ML  SOLN  Comparison: CT of the abdomen and pelvis 06/22/2012.  Findings:  Lung Bases: A small amount of subsegmental atelectasis in the lung bases bilaterally.  Otherwise, unremarkable.  Abdomen/Pelvis:  Status post cholecystectomy.  Diffusely decreased attenuation throughout the hepatic parenchyma, suggestive of hepatic steatosis.  No focal cystic or solid hepatic lesions.  The appearance of the pancreas, spleen, bilateral adrenal glands and the right kidney is unremarkable.  In the upper pole of the left kidney there is a well-defined low attenuation lesion with no enhancement measuring 16 mm in diameter, compatible with a simple cyst (similar to prior).  There is no significant volume of ascites, no pneumoperitoneum and no pathologic distension of small bowel.  No definite pathologic lymphadenopathy identified within the abdomen or pelvis.  Urinary bladder is unremarkable in appearance.  Status post hysterectomy. Ovaries are not confidently identified and may be surgically absent  or atrophic.  The appendix is surgically absent.  Musculoskeletal: There are no aggressive appearing lytic or blastic lesions noted in the visualized portions of the skeleton.  IMPRESSION: 1.  No acute findings in the abdomen or pelvis to account for the patient's symptoms. 2.  Status post appendectomy, hysterectomy and cholecystectomy. 3.  Decreased attenuation in the hepatic parenchyma suggestive of hepatic steatosis. 4.  1.6 cm simple cyst in the upper pole of the left kidney.   Original Report Authenticated By: Trudie Reed, M.D.    Ct Abdomen Pelvis W Contrast  06/22/2012  *RADIOLOGY REPORT*  Clinical Data: Abdominal pain  CT ABDOMEN AND PELVIS WITH CONTRAST  Technique:  Multidetector CT imaging of the abdomen and pelvis was performed following the standard protocol during bolus administration of intravenous contrast.  Contrast: OMNIPAQUE IOHEXOL 300 MG/ML   SOLN  Comparison: 08/29/2011  Findings: Diffuse hepatic steatosis.  Postoperative changes from gastrojejunostomy are again noted.  Soft tissue adjacent to the anastomosis has improved.  Ring enhancing abnormality anterior to the left lobe of the liver has also resolved.  Spleen, adrenal glands, are within normal limits.  The biliary dilatation has improved.  Pancreatic duct is stable in caliber. Simple cyst in the left kidney.  Otherwise kidneys are within normal limits.  No disproportionate dilatation of bowel to suggest obstruction.  No extraluminal bowel gas.  The transverse colon is decompressed however the wall is ill- defined and hazy.  This process continues into the descending colon and sigmoid colon.  No free fluid.  No obvious abnormal adenopathy.  Bladder is within normal limits.  The appendix is not clearly defined.  Stable T12 compression fracture with retropulsion of the superior endplate.  IMPRESSION: Wall thickening and inflammatory change of the transverse colon, descending colon, and sigmoid colon is suspected.  Differential diagnosis includes inflammatory process such as pseudomembranous colitis or infection.  Also consider inflammatory bowel disease and ischemia.  Improved postoperative changes as described.   Original Report Authenticated By: Jolaine Click, M.D.    Dg Abd Acute W/chest  07/10/2012  *RADIOLOGY REPORT*  Clinical Data: Mid abdominal pain and vomiting.  Recent ulcer repair.  ACUTE ABDOMEN SERIES (ABDOMEN 2 VIEW & CHEST 1 VIEW)  Comparison: CT abdomen pelvis 06/28/2012 and acute abdominal series 06/24/2012.  Findings: Frontal view of the chest shows midline trachea and normal heart size.  Descending thoracic aorta is somewhat tortuous. Biapical pleural parenchymal scarring.  Lungs are hyperinflated but clear.  No pleural fluid.  Mid thoracic vertebroplasty is noted.  Two views of the abdomen show gaseous distention of small bowel loops with gas and stool scattered in the colon.   Some associated air fluid levels.  Postoperative changes are scattered throughout the abdomen.  IMPRESSION: Bowel gas pattern is somewhat nonspecific and may be due to constipation.  Difficult to exclude a partial small bowel obstruction.   Original Report Authenticated By: Leanna Battles, M.D.    Dg Abd Acute W/chest  06/24/2012  *RADIOLOGY REPORT*  Clinical Data: Increased abdominal pain since Thursday.  Passing blood since Monday.  History of surgery 11 months ago to remove portion of the stomach and small bowel.  ACUTE ABDOMEN SERIES (ABDOMEN 2 VIEW & CHEST 1 VIEW)  Comparison: 10/14/2011  Findings: Heart size is normal.  The lungs are free of focal consolidations and pleural effusions.  The aorta is tortuous.  Surgical clips are identified in the right mid abdomen.  Bowel gas pattern is nonobstructive. Contrast is present in the colon following  recent CT exam.  Colonic loops do not appear distended. No free intraperitoneal air beneath diaphragm.  Patient has had previous mid thoracic vertebral plasty.  IMPRESSION:  1. No evidence for acute cardiopulmonary abnormality. 2.  Nonobstructive bowel gas pattern.   Original Report Authenticated By: Norva Pavlov, M.D.     Microbiology: No results found for this or any previous visit (from the past 240 hour(s)).   Labs: Basic Metabolic Panel:  Recent Labs Lab 07/10/12 1230 07/10/12 1404 07/11/12 0608  NA 143  --  140  K 2.9*  --  3.4*  CL 104  --  103  CO2 31  --  29  GLUCOSE 97  --  86  BUN 3*  --  <3*  CREATININE 0.51  --  0.48*  CALCIUM 9.2  --  8.8  MG  --  1.9  --    Liver Function Tests:  Recent Labs Lab 07/10/12 1256 07/11/12 0608  AST 22 19  ALT 7 8  ALKPHOS 143* 134*  BILITOT 0.3 0.3  PROT 6.7 6.1  ALBUMIN 3.7 3.3*    Recent Labs Lab 07/10/12 1256  LIPASE 27   No results found for this basename: AMMONIA,  in the last 168 hours CBC:  Recent Labs Lab 07/10/12 1230 07/11/12 0608  WBC 3.8* 4.8  NEUTROABS 2.4   --   HGB 10.0* 9.1*  HCT 32.4* 28.9*  MCV 74.0* 74.5*  PLT 296 266   Cardiac Enzymes: No results found for this basename: CKTOTAL, CKMB, CKMBINDEX, TROPONINI,  in the last 168 hours BNP: BNP (last 3 results) No results found for this basename: PROBNP,  in the last 8760 hours CBG: No results found for this basename: GLUCAP,  in the last 168 hours  Signed:  Gwenyth Bender  [facility] 07/11/2012, 3:14 PM   attending note   patient interviewed and examined. She is tolerating clear liquids. There has been no diarrhea. No witnessed emesis, just spit in her emesis basin. Suspect symptoms secondary to opiate withdrawal. Continue Carafate, Creon, protonix.  Crista Curb, M.D.

## 2012-07-11 NOTE — Progress Notes (Signed)
[facility] PROGRESS NOTE  Jordan Haney:[id number] DOB: [birth year] DOA: 07/10/2012 PCP: Anson [place], DO  Assessment/Plan: 1. Nausea and vomiting/epigastric pain: hx of same chronically. No vomiting or diarrhea since admission. 1 episode dry heaves. Tolerating clear liquid diet. Will advance diet, then discharge.  Suspect an element of opiate withdrawal and/or drug seeking behavior.   Diarrhea: No stool since admission. Tolerating clear liquid diet. Will advance diet.   Hypokalemia: resolving   Chronic Epigastric pain: see #1. PPI, carafate  Anemia: hx of same.microcytic. Chart review indicates baseline range 8-9. Currently 9.1 probably concentrated. No s/sx active bleeding.   Abnormal LFT's. Hx of same. Chart review indicates currently at baseline.   HTN: controlled continue home norvasc.    Code Status: full Family Communication:  Disposition Plan: home when ready either late today or early tomorrow.    Consultants:  none  Procedures:  none  Antibiotics:  none  HPI/Subjective: Moaning in bed. Complains abdominal pain/nausea  Objective: Filed Vitals:   07/10/12 1630 07/10/12 2044 07/11/12 0422 07/11/12 1400  BP: 154/89 129/82 131/78 129/92  Pulse: 78 68 66 80  Temp: 97.6 F (36.4 C) 98.1 F (36.7 C) 98.1 F (36.7 C) 98.9 F (37.2 C)  TempSrc: Oral Oral Oral Oral  Resp:  15 16 16   Height: 5\' 4"  (1.626 m)     Weight: 48.172 kg (106 lb 3.2 oz)     SpO2: 98% 98% 98% 96%    Intake/Output Summary (Last 24 hours) at 07/11/12 1540 Last data filed at 07/11/12 0551  Gross per 24 hour  Intake   1710 ml  Output      0 ml  Net   1710 ml   Filed Weights   07/10/12 1158 07/10/12 1630  Weight: 47.628 kg (105 lb) 48.172 kg (106 lb 3.2 oz)    Exam:   General:  Thin frail  Cardiovascular: RRR No MGR no LE edema  Respiratory: normal effort BSCTAB no wheeze  Abdomen: flat soft +BS diffuse tenderness  Musculoskeletal: no clubbing no  cyanosis   Data Reviewed: Basic Metabolic Panel:  Recent Labs Lab 07/10/12 1230 07/10/12 1404 07/11/12 0608  NA 143  --  140  K 2.9*  --  3.4*  CL 104  --  103  CO2 31  --  29  GLUCOSE 97  --  86  BUN 3*  --  <3*  CREATININE 0.51  --  0.48*  CALCIUM 9.2  --  8.8  MG  --  1.9  --    Liver Function Tests:  Recent Labs Lab 07/10/12 1256 07/11/12 0608  AST 22 19  ALT 7 8  ALKPHOS 143* 134*  BILITOT 0.3 0.3  PROT 6.7 6.1  ALBUMIN 3.7 3.3*    Recent Labs Lab 07/10/12 1256  LIPASE 27   No results found for this basename: AMMONIA,  in the last 168 hours CBC:  Recent Labs Lab 07/10/12 1230 07/11/12 0608  WBC 3.8* 4.8  NEUTROABS 2.4  --   HGB 10.0* 9.1*  HCT 32.4* 28.9*  MCV 74.0* 74.5*  PLT 296 266   Cardiac Enzymes: No results found for this basename: CKTOTAL, CKMB, CKMBINDEX, TROPONINI,  in the last 168 hours BNP (last 3 results) No results found for this basename: PROBNP,  in the last 8760 hours CBG: No results found for this basename: GLUCAP,  in the last 168 hours  No results found for this or any previous visit (from the past 240 hour(s)).  Studies: Dg Abd Acute W/chest  07/10/2012  *RADIOLOGY REPORT*  Clinical Data: Mid abdominal pain and vomiting.  Recent ulcer repair.  ACUTE ABDOMEN SERIES (ABDOMEN 2 VIEW & CHEST 1 VIEW)  Comparison: CT abdomen pelvis 06/28/2012 and acute abdominal series 06/24/2012.  Findings: Frontal view of the chest shows midline trachea and normal heart size.  Descending thoracic aorta is somewhat tortuous. Biapical pleural parenchymal scarring.  Lungs are hyperinflated but clear.  No pleural fluid.  Mid thoracic vertebroplasty is noted.  Two views of the abdomen show gaseous distention of small bowel loops with gas and stool scattered in the colon.  Some associated air fluid levels.  Postoperative changes are scattered throughout the abdomen.  IMPRESSION: Bowel gas pattern is somewhat nonspecific and may be due to constipation.   Difficult to exclude a partial small bowel obstruction.   Original Report Authenticated By: Leanna Battles, M.D.     Scheduled Meds: . ALPRAZolam  0.5 mg Oral BID  . feeding supplement  1 Container Oral TID BM  . levothyroxine  25 mcg Oral QAC breakfast  . lipase/protease/amylase  3 capsule Oral TID AC  . pantoprazole (PROTONIX) IV  40 mg Intravenous Q24H  . sodium chloride  3 mL Intravenous Q12H  . sucralfate  1 g Oral TID WC & HS   Continuous Infusions: . sodium chloride 50 mL/hr at 07/11/12 1610    Active Problems:   Nausea and vomiting   Hypokalemia   Epigastric pain   Abnormal LFTs   Anemia  Time spent: 30 minutes  [facility]  [facility]  If 7PM-7AM, please contact night-coverage at [email or web address], password [facility] 07/11/2012, 3:40 PM  LOS: 1 day   Attending note:  Hypokalemia nearly resolved.  Has had not diarrhea.  No DOCUMENTED emesis, just spit in her basin.  She has abdominal pain nausea vomiting diarrhea all chronic and recurrent. Suspect opiate related, either withdrawal and or drug seeking. No evidence of anything new going on. Patient reports that she did not run out of opiates at home which seems suspect, as her symptoms resolve once opiates were resumed. Discharge today.  Discussed with Dr. Darrick Penna who agrees. See discharge summary.  Crista Curb, M.D.

## 2012-07-11 NOTE — Progress Notes (Signed)
Patient with orders to be discharge home. Discharge instructions given, patient verbalized understanding. Patient in stable condition upon discharge. Patient left with spouse in private vehicle.  

## 2012-07-11 NOTE — Progress Notes (Signed)
UR Chart Review Completed  

## 2012-07-11 NOTE — Progress Notes (Addendum)
INITIAL NUTRITION ASSESSMENT  DOCUMENTATION CODES Per approved criteria  -Non-severe (moderate) malnutrition in the context of chronic illness -Underweight   INTERVENTION:  Resource Breeze po BID, each supplement provides 250 kcal and 9 grams of protein.  NUTRITION DIAGNOSIS: Malnutrition related inadequte oral intake as evidenced by PMH, chronic abdominal pain, wt loss, diet recall and muscle loss and fat depletion  Goal: Pt to meet >/= 90% of their estimated nutrition needs  Monitor:  Po intake, labs and wt trends  Reason for Assessment: Malnurtriton Screen  57 y.o. female  Admitting Dx: nausea and vomiting  ASSESSMENT:  Recent hospitalization for colitis. RD also assessed pt on August 30, 2011 her wt at that time 131#. Current wt reflects unplanned 25#, 19% unintentional wt loss in 10 months; which is significant.   Her hx  includes but not limited to: chronic abdominal pain, pancreatitis, partial gastrectomy and common bile duct repair, duodenal ulcer and rheumatoid arthritis.  Pt meets criteria for moderate malnutrition  In the context of chronic illness given her energy intake of <75% for >/= 1 month mild orbital and temporal muscle and fat loss.  Height: Ht Readings from Last 1 Encounters:  07/10/12 5\' 4"  (1.626 m)    Weight: Wt Readings from Last 1 Encounters:  07/10/12 106 lb 3.2 oz (48.172 kg)    Ideal Body Weight: 120# (54.5 kg)  % Ideal Body Weight: 88%  Wt Readings from Last 10 Encounters:  07/10/12 106 lb 3.2 oz (48.172 kg)  06/24/12 111 lb 1.8 oz (50.4 kg)  06/24/12 111 lb 1.8 oz (50.4 kg)  06/22/12 102 lb 12.8 oz (46.63 kg)  10/14/11 125 lb (56.7 kg)  10/13/11 120 lb (54.432 kg)  10/08/11 125 lb (56.7 kg)  09/19/11 125 lb (56.7 kg)  09/16/11 125 lb (56.7 kg)  08/29/11 131 lb (59.421 kg)    Usual Body Weight: 120-125#  % Usual Body Weight: 88%  BMI:  Body mass index is 18.22 kg/(m^2). Underweight  Estimated Nutritional Needs: Kcal:  1500-1750 Protein: 75-85 gr Fluid: >1800 ml/day  Skin: No issues noted  Diet Order: Parke Simmers  EDUCATION NEEDS: -Education needs addressed   Intake/Output Summary (Last 24 hours) at 07/11/12 1131 Last data filed at 07/11/12 0551  Gross per 24 hour  Intake   1710 ml  Output      0 ml  Net   1710 ml    Last BM: 07/10/12  Labs:   Recent Labs Lab 07/10/12 1230 07/10/12 1404 07/11/12 0608  NA 143  --  140  K 2.9*  --  3.4*  CL 104  --  103  CO2 31  --  29  BUN 3*  --  <3*  CREATININE 0.51  --  0.48*  CALCIUM 9.2  --  8.8  MG  --  1.9  --   GLUCOSE 97  --  86    CBG (last 3)  No results found for this basename: GLUCAP,  in the last 72 hours  Scheduled Meds: . ALPRAZolam  0.5 mg Oral BID  . levothyroxine  25 mcg Oral QAC breakfast  . lipase/protease/amylase  3 capsule Oral TID AC  . pantoprazole (PROTONIX) IV  40 mg Intravenous Q24H  . sodium chloride  3 mL Intravenous Q12H  . sucralfate  1 g Oral TID WC & HS    Continuous Infusions: . sodium chloride 50 mL/hr at 07/11/12 7829    Past Medical History  Diagnosis Date  . RA (rheumatoid arthritis)  2008  .  MI (myocardial infarction) 2008    Not well documented, patient reports reassuring cardiac catheterization and stress testing in [place]  . Constipation 03/21/2011  . Hypokalemia 03/21/2011  . Fatty liver 03/21/2011  . Pancreatitis     states 3 years ago, very severe, ?biliary pancreatitis   . T12 compression fracture 2011  . Duodenal ulcer     remote per patient. +BC powders, patient report negative EGD six months ago  . Dilated pancreatic duct     ?chronic pancreatitis, EUS pending (06/02/11)  . Kidney stones   . Chronic abdominal pain     Past Surgical History  Procedure Laterality Date  . Cholecystectomy    . Knee surgery    . Appendectomy    . Complete hysterectomy    . Ventral wall hernia    . Esophagogastroduodenoscopy  08/2010    Dr. Samuella Cota, Versed. small hh, mild prepylori gastritis. path  unavailable  . Esophagogastroduodenoscopy  04/2010    Dr. Samuella Cota, Versed. small hh, mild distal esophagitis, antral gastritis and duodenitis. Bx mild chronic gastritis and no H.pylori  . Esophagogastroduodenoscopy  08/2009    Dr. Allena Katz, Versed. Moderate gastritis, moderate duodenitis with nodularity in proximal duodenal bulb, bx chronic gastritis, no helicobacter, mild chronic duodenitis  . Esophagogastroduodenoscopy  02/2007    Dr. Samuella Cota, Versed. antral gastritis and duodenal ulcers. Bx mild chronic gastritis. No bx from duodenal ulcer available.  Gaspar Bidding dilation  04/06/2011    Procedure: SAVORY DILATION;  Surgeon: Arlyce Harman, MD;  Location: [facility];  Service: Endoscopy;  Laterality: N/A;  16 mm  . Esophagogastroduodenoscopy  04/06/11    Dr. Darrick Penna: 1-2 cm hiatal hernia, mod gastritis, 29mmX3mm linear ulcer in duodenal bulb, stricture 1st part of duodenum, dilated to 12 mm  . Esophagogastroduodenoscopy  05/12/11    partial gastric oulet obstruction secondary to stricture between bulb/2nd portion of duodenum with marked friability and inflammation but no discrete ulcer, dilated stomach. s/p dilation but high risk for restenosis  . Esophagogastroduodenoscopy  06/22/2011    Procedure: ESOPHAGOGASTRODUODENOSCOPY (EGD);  Surgeon: West Bali, MD;  Location: [facility];  Service: Endoscopy;  Laterality: N/A;  . Gastrojejunostomy  06/28/2011    Procedure: GASTROJEJUNOSTOMY;  Surgeon: Fabio Bering, MD;  Location: [facility];  Service: General;  Laterality: N/A;  Roux-en-y, Choledocalduodenostomy  . Abdominal hysterectomy    . Kidney stone surgery    . Esophagogastroduodenoscopy Left 06/25/2012    Procedure: ESOPHAGOGASTRODUODENOSCOPY (EGD);  Surgeon: Corbin Ade, MD;  Location: [facility];  Service: Endoscopy;  Laterality: Left;  . Back surgery  2013    Royann Shivers MS,RD,LDN,CSG Office: #[phone] Pager: [phone]

## 2012-07-12 ENCOUNTER — Telehealth: Payer: Self-pay | Admitting: Gastroenterology

## 2012-07-12 ENCOUNTER — Other Ambulatory Visit: Payer: Self-pay | Admitting: Gastroenterology

## 2012-07-12 DIAGNOSIS — R112 Nausea with vomiting, unspecified: Secondary | ICD-10-CM

## 2012-07-12 DIAGNOSIS — R1013 Epigastric pain: Secondary | ICD-10-CM

## 2012-07-12 NOTE — Telephone Encounter (Signed)
Patient is scheduled for SBFT on Friday April 25th at 9:15 and I have LMOM for her to call me to confirm this

## 2012-07-12 NOTE — Telephone Encounter (Signed)
PT SEEN AS INPT COURTESY CONSULT. PRESENTED AGAIN WITH ABDOMINAL PAIN, NAUSEA, VOMITING, AND DIARRHEA. NEEDS SBFT Dx: ABDOMINAL PAIN/VOMITING TO EVALUATE FOR SB ADHESIVE DISEASE.

## 2012-07-14 ENCOUNTER — Other Ambulatory Visit (HOSPITAL_COMMUNITY)

## 2012-07-18 ENCOUNTER — Ambulatory Visit (HOSPITAL_COMMUNITY)
Admission: RE | Admit: 2012-07-18 | Discharge: 2012-07-18 | Disposition: A | Discharge status: Home / Self Care | Source: Ambulatory Visit | Attending: Gastroenterology | Admitting: Gastroenterology

## 2012-07-18 DIAGNOSIS — R112 Nausea with vomiting, unspecified: Secondary | ICD-10-CM

## 2012-07-18 DIAGNOSIS — R109 Unspecified abdominal pain: Secondary | ICD-10-CM | POA: Insufficient documentation

## 2012-07-18 DIAGNOSIS — R1110 Vomiting, unspecified: Secondary | ICD-10-CM | POA: Insufficient documentation

## 2012-07-18 DIAGNOSIS — Z9049 Acquired absence of other specified parts of digestive tract: Secondary | ICD-10-CM | POA: Insufficient documentation

## 2012-07-18 DIAGNOSIS — R1013 Epigastric pain: Secondary | ICD-10-CM

## 2012-07-20 ENCOUNTER — Telehealth: Payer: Self-pay | Admitting: Gastroenterology

## 2012-07-20 NOTE — Telephone Encounter (Signed)
Called and informed pt.  

## 2012-07-20 NOTE — Telephone Encounter (Signed)
PLEASE CALL PT. HER SBFT SHOWS TRANSIENT DILATION OF HER SMALL BOWEL BUT NO EVIDENCE FOR ADHESIONS OR OBSTRUCTION. OPV IN 6 WEEKS E 30 ABD PAIN/NAUSEA/VOMTIING

## 2012-07-20 NOTE — Telephone Encounter (Signed)
Results Cc to PCP  

## 2012-07-20 NOTE — Telephone Encounter (Signed)
Pt is aware of OV on 5/27 at 1130 with LSL

## 2012-07-26 ENCOUNTER — Telehealth: Payer: Self-pay

## 2012-07-26 NOTE — Telephone Encounter (Signed)
Pt called and said she is still having abdominal pain. She said it is about the level of her belly button and out to the right a little. She rated it at an 8 earlier this AM, but said it is about a 7 now. She gets very nauseated when she has the pain. Said she is having regular BM's and is not constipated. Please advise!

## 2012-07-26 NOTE — Telephone Encounter (Signed)
Called and informed pt.  

## 2012-07-26 NOTE — Telephone Encounter (Signed)
PLEASE CALL PT. SHE SHOULD FOLLOW A FULL LIQUID DIET FOR THE NEXT 3 DAYS UNTIL THE ABDOMINAL PAIN IMPROVES. SHE SHOULD USE SOY MILK OR LACTOSE FREE ICE CREAM.  Full Liquid Diet A high-calorie, high-protein supplement should be used to meet your nutritional requirements when the full liquid diet is continued for more than 2 or 3 days. If this diet is to be used for an extended period of time (more than 7 days), a multivitamin should be considered.  Breads and Starches  Allowed: None are allowed except crackersWHOLE OR pureed (made into a thick, smooth soup) in soup. Cooked, refined corn, oat, rice, rye, and wheat cereals are also allowed.   Avoid: Any others.    Potatoes/Pasta/Rice  Allowed: ANY ITEM AS A SOUP OR SMALL PLATE OF MASHED POTATOES OR RICE.       Vegetables  Allowed: Strained tomato or vegetable juice. Vegetables pureed in soup.   Avoid: Any others.    Fruit  Allowed: Any strained fruit juices and fruit drinks. Include 1 serving of citrus or vitamin C-enriched fruit juice daily.   Avoid: Any others.  Meat and Meat Substitutes  Allowed: Egg  Avoid: Any meat, fish, or fowl. All cheese.  Milk  Allowed: SOY Milk beverages, including milk shakes and instant breakfast mixes. Smooth yogurt.   Avoid: Any others. Avoid dairy products if not tolerated.    Soups and Combination Foods  Allowed: Broth, strained cream soups. Strained, broth-based soups.   Avoid: Any others.    Desserts and Sweets  Allowed: flavored gelatin, tapioca, plain LACTOSE FREE ice cream, sherbet, smooth pudding, junket, fruit ices, frozen ice pops, pudding pops,, frozen fudge pops, chocolate syrup. Sugar, honey, jelly, syrup.   Avoid: Any others.  Fats and Oils  Allowed: Margarine, butter, cream, sour cream, oils.   Avoid: Any others.  Beverages  Allowed: All.   Avoid: None.  Condiments  Allowed: Iodized salt, pepper, spices, flavorings. Cocoa powder.   Avoid: Any others.     SAMPLE MEAL PLAN Breakfast   cup orange juice.   1 cup cooked wheat cereal.   1 cup SOY milk.   1 cup beverage (coffee or tea).   Cream or sugar, if desired.    Midmorning Snack  2 SCRAMBLED OR HARD BOILED EGG   Lunch  1 cup cream soup.    cup fruit juice.   1 cup SOY milk.    cup custard.   1 cup beverage (coffee or tea).   Cream or sugar, if desired.    Midafternoon Snack  1 cup VANILLA SOY milk shake.  Dinner  1 cup cream soup.    cup fruit juice.   1 cup SOY MILK    cup pudding.   1 cup beverage (coffee or tea).   Cream or sugar, if desired.  Evening Snack  1 cup supplement.  To increase calories, add sugar, cream, butter, or margarine if possible. Nutritional supplements will also increase the total calories.

## 2012-07-27 ENCOUNTER — Ambulatory Visit: Admit: 2012-07-27 | Admitting: Gastroenterology

## 2012-07-27 SURGERY — EGD (ESOPHAGOGASTRODUODENOSCOPY)
Anesthesia: Moderate Sedation

## 2012-08-11 ENCOUNTER — Telehealth: Payer: Self-pay

## 2012-08-11 ENCOUNTER — Inpatient Hospital Stay (HOSPITAL_COMMUNITY)
Admission: EM | Admit: 2012-08-11 | Discharge: 2012-08-21 | DRG: 394 | Disposition: A | Discharge status: Home / Self Care | Attending: Internal Medicine | Admitting: Internal Medicine

## 2012-08-11 ENCOUNTER — Encounter: Payer: Self-pay | Admitting: Internal Medicine

## 2012-08-11 DIAGNOSIS — R7989 Other specified abnormal findings of blood chemistry: Secondary | ICD-10-CM

## 2012-08-11 DIAGNOSIS — D509 Iron deficiency anemia, unspecified: Secondary | ICD-10-CM | POA: Diagnosis present

## 2012-08-11 DIAGNOSIS — Z79899 Other long term (current) drug therapy: Secondary | ICD-10-CM

## 2012-08-11 DIAGNOSIS — R109 Unspecified abdominal pain: Secondary | ICD-10-CM

## 2012-08-11 DIAGNOSIS — K315 Obstruction of duodenum: Secondary | ICD-10-CM

## 2012-08-11 DIAGNOSIS — R197 Diarrhea, unspecified: Secondary | ICD-10-CM | POA: Diagnosis present

## 2012-08-11 DIAGNOSIS — K5900 Constipation, unspecified: Secondary | ICD-10-CM | POA: Diagnosis present

## 2012-08-11 DIAGNOSIS — I252 Old myocardial infarction: Secondary | ICD-10-CM

## 2012-08-11 DIAGNOSIS — Z681 Body mass index (BMI) 19 or less, adult: Secondary | ICD-10-CM

## 2012-08-11 DIAGNOSIS — K760 Fatty (change of) liver, not elsewhere classified: Secondary | ICD-10-CM

## 2012-08-11 DIAGNOSIS — K529 Noninfective gastroenteritis and colitis, unspecified: Secondary | ICD-10-CM

## 2012-08-11 DIAGNOSIS — K279 Peptic ulcer, site unspecified, unspecified as acute or chronic, without hemorrhage or perforation: Secondary | ICD-10-CM

## 2012-08-11 DIAGNOSIS — D649 Anemia, unspecified: Secondary | ICD-10-CM

## 2012-08-11 DIAGNOSIS — E876 Hypokalemia: Secondary | ICD-10-CM

## 2012-08-11 DIAGNOSIS — Z888 Allergy status to other drugs, medicaments and biological substances status: Secondary | ICD-10-CM

## 2012-08-11 DIAGNOSIS — E860 Dehydration: Secondary | ICD-10-CM

## 2012-08-11 DIAGNOSIS — K7689 Other specified diseases of liver: Secondary | ICD-10-CM | POA: Diagnosis present

## 2012-08-11 DIAGNOSIS — R1013 Epigastric pain: Secondary | ICD-10-CM

## 2012-08-11 DIAGNOSIS — R1310 Dysphagia, unspecified: Secondary | ICD-10-CM

## 2012-08-11 DIAGNOSIS — R748 Abnormal levels of other serum enzymes: Secondary | ICD-10-CM | POA: Diagnosis present

## 2012-08-11 DIAGNOSIS — Z90710 Acquired absence of both cervix and uterus: Secondary | ICD-10-CM

## 2012-08-11 DIAGNOSIS — Z8249 Family history of ischemic heart disease and other diseases of the circulatory system: Secondary | ICD-10-CM

## 2012-08-11 DIAGNOSIS — M810 Age-related osteoporosis without current pathological fracture: Secondary | ICD-10-CM | POA: Diagnosis present

## 2012-08-11 DIAGNOSIS — Z91040 Latex allergy status: Secondary | ICD-10-CM

## 2012-08-11 DIAGNOSIS — N390 Urinary tract infection, site not specified: Secondary | ICD-10-CM

## 2012-08-11 DIAGNOSIS — Z9089 Acquired absence of other organs: Secondary | ICD-10-CM

## 2012-08-11 DIAGNOSIS — F4321 Adjustment disorder with depressed mood: Secondary | ICD-10-CM | POA: Diagnosis present

## 2012-08-11 DIAGNOSIS — D175 Benign lipomatous neoplasm of intra-abdominal organs: Principal | ICD-10-CM | POA: Diagnosis present

## 2012-08-11 DIAGNOSIS — R1031 Right lower quadrant pain: Secondary | ICD-10-CM | POA: Diagnosis present

## 2012-08-11 DIAGNOSIS — R112 Nausea with vomiting, unspecified: Secondary | ICD-10-CM

## 2012-08-11 DIAGNOSIS — R195 Other fecal abnormalities: Secondary | ICD-10-CM

## 2012-08-11 DIAGNOSIS — R945 Abnormal results of liver function studies: Secondary | ICD-10-CM

## 2012-08-11 DIAGNOSIS — R634 Abnormal weight loss: Secondary | ICD-10-CM

## 2012-08-11 DIAGNOSIS — M069 Rheumatoid arthritis, unspecified: Secondary | ICD-10-CM | POA: Diagnosis present

## 2012-08-11 DIAGNOSIS — K920 Hematemesis: Secondary | ICD-10-CM

## 2012-08-11 DIAGNOSIS — K8689 Other specified diseases of pancreas: Secondary | ICD-10-CM

## 2012-08-11 LAB — CBC WITH DIFFERENTIAL/PLATELET
Basophils Absolute: 0.1 10*3/uL (ref 0.0–0.1)
HCT: 37.9 % (ref 36.0–46.0)
Lymphocytes Relative: 22 % (ref 12–46)
Monocytes Absolute: 0.2 10*3/uL (ref 0.1–1.0)
Neutro Abs: 3.6 10*3/uL (ref 1.7–7.7)
Neutrophils Relative %: 72 % (ref 43–77)
RDW: 15.2 % (ref 11.5–15.5)
WBC: 5 10*3/uL (ref 4.0–10.5)

## 2012-08-11 LAB — COMPREHENSIVE METABOLIC PANEL
ALT: 37 U/L — ABNORMAL HIGH (ref 0–35)
AST: 56 U/L — ABNORMAL HIGH (ref 0–37)
Albumin: 4.8 g/dL (ref 3.5–5.2)
Alkaline Phosphatase: 189 U/L — ABNORMAL HIGH (ref 39–117)
BUN: 5 mg/dL — ABNORMAL LOW (ref 6–23)
Chloride: 94 mEq/L — ABNORMAL LOW (ref 96–112)
Potassium: 2.4 mEq/L — CL (ref 3.5–5.1)
Sodium: 138 mEq/L (ref 135–145)
Total Bilirubin: 0.5 mg/dL (ref 0.3–1.2)
Total Protein: 8.7 g/dL — ABNORMAL HIGH (ref 6.0–8.3)

## 2012-08-11 LAB — URINALYSIS, ROUTINE W REFLEX MICROSCOPIC
Bilirubin Urine: NEGATIVE
Glucose, UA: NEGATIVE mg/dL
Hgb urine dipstick: NEGATIVE
Ketones, ur: NEGATIVE mg/dL
Protein, ur: NEGATIVE mg/dL
pH: 6.5 (ref 5.0–8.0)

## 2012-08-11 LAB — TESTOSTERONE: Testosterone: 19 ng/dL (ref 10–70)

## 2012-08-11 MED ORDER — HYDROMORPHONE HCL PF 1 MG/ML IJ SOLN
1.0000 mg | Freq: Once | INTRAMUSCULAR | Status: AC
  Administered 2012-08-11: 1 mg via INTRAVENOUS

## 2012-08-11 MED ORDER — ONDANSETRON HCL 4 MG/2ML IJ SOLN: 4.0000 mg | Freq: Three times a day (TID) | INTRAMUSCULAR | Status: DC | PRN

## 2012-08-11 MED ORDER — ACETAMINOPHEN 325 MG PO TABS: 650.0000 mg | ORAL_TABLET | Freq: Four times a day (QID) | ORAL | Status: DC | PRN

## 2012-08-11 MED ORDER — PANTOPRAZOLE SODIUM 40 MG IV SOLR
40.0000 mg | Freq: Once | INTRAVENOUS | Status: AC
  Administered 2012-08-11: 40 mg via INTRAVENOUS
  Filled 2012-08-11: qty 40

## 2012-08-11 MED ORDER — MORPHINE SULFATE 2 MG/ML IJ SOLN
2.0000 mg | INTRAMUSCULAR | Status: DC | PRN
  Administered 2012-08-11 – 2012-08-12 (×3): 2 mg via INTRAVENOUS
  Filled 2012-08-11 (×3): qty 1

## 2012-08-11 MED ORDER — SODIUM CHLORIDE 0.9 % IV BOLUS (SEPSIS)
1000.0000 mL | Freq: Once | INTRAVENOUS | Status: AC
  Administered 2012-08-11: 1000 mL via INTRAVENOUS

## 2012-08-11 MED ORDER — ONDANSETRON HCL 4 MG/2ML IJ SOLN
4.0000 mg | Freq: Four times a day (QID) | INTRAMUSCULAR | Status: DC | PRN
  Administered 2012-08-11 – 2012-08-21 (×16): 4 mg via INTRAVENOUS
  Filled 2012-08-11 (×9): qty 2

## 2012-08-11 MED ORDER — SODIUM CHLORIDE 0.9 % IV BOLUS (SEPSIS): 1000.0000 mL | Freq: Once | INTRAVENOUS | Status: DC

## 2012-08-11 MED ORDER — SODIUM CHLORIDE 0.9 % IV SOLN: INTRAVENOUS | Status: DC

## 2012-08-11 MED ORDER — PANTOPRAZOLE SODIUM 40 MG IV SOLR: 40.0000 mg | Freq: Once | INTRAVENOUS | Status: DC

## 2012-08-11 MED ORDER — HEPARIN SODIUM (PORCINE) 5000 UNIT/ML IJ SOLN
5000.0000 [IU] | Freq: Three times a day (TID) | INTRAMUSCULAR | Status: DC
  Administered 2012-08-11 – 2012-08-15 (×14): 5000 [IU] via SUBCUTANEOUS
  Filled 2012-08-11 (×14): qty 1

## 2012-08-11 MED ORDER — SODIUM CHLORIDE 0.9 % IJ SOLN
3.0000 mL | Freq: Two times a day (BID) | INTRAMUSCULAR | Status: DC
  Administered 2012-08-11 – 2012-08-18 (×11): 3 mL via INTRAVENOUS
  Filled 2012-08-11 (×2): qty 3

## 2012-08-11 MED ORDER — OXYCODONE HCL 5 MG PO TABS
5.0000 mg | ORAL_TABLET | Freq: Four times a day (QID) | ORAL | Status: DC | PRN
  Administered 2012-08-13: 5 mg via ORAL
  Filled 2012-08-11: qty 1

## 2012-08-11 MED ORDER — POTASSIUM CHLORIDE IN NACL 40-0.9 MEQ/L-% IV SOLN
INTRAVENOUS | Status: DC
  Administered 2012-08-11 – 2012-08-15 (×7): via INTRAVENOUS

## 2012-08-11 MED ORDER — ALPRAZOLAM 0.5 MG PO TABS
0.5000 mg | ORAL_TABLET | Freq: Two times a day (BID) | ORAL | Status: DC
  Administered 2012-08-11 – 2012-08-21 (×21): 0.5 mg via ORAL
  Filled 2012-08-11 (×21): qty 1

## 2012-08-11 MED ORDER — POTASSIUM CHLORIDE 10 MEQ/100ML IV SOLN
10.0000 meq | INTRAVENOUS | Status: AC
  Administered 2012-08-11 (×3): 10 meq via INTRAVENOUS
  Filled 2012-08-11 (×3): qty 100

## 2012-08-11 MED ORDER — PANCRELIPASE (LIP-PROT-AMYL) 12000-38000 UNITS PO CPEP
3.0000 | ORAL_CAPSULE | Freq: Three times a day (TID) | ORAL | Status: DC
  Administered 2012-08-11 – 2012-08-21 (×27): 3 via ORAL
  Filled 2012-08-11 (×27): qty 3
  Filled 2012-08-11: qty 2
  Filled 2012-08-11: qty 3

## 2012-08-11 MED ORDER — ONDANSETRON HCL 4 MG/2ML IJ SOLN
4.0000 mg | Freq: Once | INTRAMUSCULAR | Status: AC
  Administered 2012-08-16: 4 mg via INTRAVENOUS
  Filled 2012-08-11 (×8): qty 2

## 2012-08-11 MED ORDER — ONDANSETRON HCL 4 MG/2ML IJ SOLN
4.0000 mg | Freq: Once | INTRAMUSCULAR | Status: AC
  Administered 2012-08-11: 4 mg via INTRAVENOUS
  Filled 2012-08-11: qty 2

## 2012-08-11 MED ORDER — ZOLPIDEM TARTRATE 5 MG PO TABS
5.0000 mg | ORAL_TABLET | Freq: Every evening | ORAL | Status: DC | PRN
  Administered 2012-08-12 – 2012-08-20 (×9): 5 mg via ORAL
  Filled 2012-08-11 (×9): qty 1

## 2012-08-11 MED ORDER — ONDANSETRON HCL 4 MG PO TABS: 4.0000 mg | ORAL_TABLET | Freq: Four times a day (QID) | ORAL | Status: DC | PRN

## 2012-08-11 MED ORDER — SUCRALFATE 1 GM/10ML PO SUSP
1.0000 g | Freq: Three times a day (TID) | ORAL | Status: DC
  Administered 2012-08-11 – 2012-08-21 (×37): 1 g via ORAL
  Filled 2012-08-11 (×38): qty 10

## 2012-08-11 NOTE — Telephone Encounter (Signed)
REVIEWED. AGREE. 

## 2012-08-11 NOTE — ED Notes (Signed)
CRITICAL VALUE ALERT  Critical value received:  2.4 potassium  Date of notification: 08/11/12  Time of notification:  1322  Critical value read back: yes  Nurse who received alert: j. Bing Quarry  MD notified (1st page):  [email or web address]  Time of first page:  1323  MD notified (2nd page):  Time of second page:  Responding MD:  steinal  Time MD responded: 1323

## 2012-08-11 NOTE — H&P (Signed)
[facility] History and Physical  Jordan Haney ZOX:[id number] DOB: 1955-07-06 DOA: 08/11/2012  Referring physician: Dr Laural Benes PCP: Anson [place], DO  Specialists: Gastroenterology, Dr. Kendell Bane.  Chief Complaint: Abdominal pain, nausea and vomiting, diarrhea.  HPI: Jordan Haney is a 57 y.o. female who presents once again with symptoms of abdominal pain, nausea vomiting and diarrhea. She has become dehydrated and hypokalemic again. She has had a complicated past medical history with a Roux-en-Y procedure by Dr. Leticia Penna previously. She has recently been seen by Dr. Rourke/Dr. fields who have been managing her symptoms as an outpatient. However, the last 3-4 days, her symptoms became much worse and she was unable to stay at home. She continues to lose weight and is concerned about this. She does not have any rectal bleeding. There is no hematemesis.   Review of Systems:  Apart from history of present illness, other systems negative.  Past Medical History  Diagnosis Date  . RA (rheumatoid arthritis)  2008  . MI (myocardial infarction) 2008    Not well documented, patient reports reassuring cardiac catheterization and stress testing in [place]  . Constipation 03/21/2011  . Hypokalemia 03/21/2011  . Fatty liver 03/21/2011  . Pancreatitis     states 3 years ago, very severe, ?biliary pancreatitis   . T12 compression fracture 2011  . Duodenal ulcer     remote per patient. +BC powders, patient report negative EGD six months ago  . Dilated pancreatic duct     ?chronic pancreatitis, EUS pending (06/02/11)  . Kidney stones   . Chronic abdominal pain    Past Surgical History  Procedure Laterality Date  . Cholecystectomy    . Knee surgery    . Appendectomy    . Complete hysterectomy    . Ventral wall hernia    . Esophagogastroduodenoscopy  08/2010    Dr. Samuella Cota, Versed. small hh, mild prepylori gastritis. path unavailable  . Esophagogastroduodenoscopy  04/2010    Dr.  Samuella Cota, Versed. small hh, mild distal esophagitis, antral gastritis and duodenitis. Bx mild chronic gastritis and no H.pylori  . Esophagogastroduodenoscopy  08/2009    Dr. Allena Katz, Versed. Moderate gastritis, moderate duodenitis with nodularity in proximal duodenal bulb, bx chronic gastritis, no helicobacter, mild chronic duodenitis  . Esophagogastroduodenoscopy  02/2007    Dr. Samuella Cota, Versed. antral gastritis and duodenal ulcers. Bx mild chronic gastritis. No bx from duodenal ulcer available.  Gaspar Bidding dilation  04/06/2011    Procedure: SAVORY DILATION;  Surgeon: Arlyce Harman, MD;  Location: [facility];  Service: Endoscopy;  Laterality: N/A;  16 mm  . Esophagogastroduodenoscopy  04/06/11    Dr. Darrick Penna: 1-2 cm hiatal hernia, mod gastritis, 51mmX3mm linear ulcer in duodenal bulb, stricture 1st part of duodenum, dilated to 12 mm  . Esophagogastroduodenoscopy  05/12/11    partial gastric oulet obstruction secondary to stricture between bulb/2nd portion of duodenum with marked friability and inflammation but no discrete ulcer, dilated stomach. s/p dilation but high risk for restenosis  . Esophagogastroduodenoscopy  06/22/2011    WJX:[id number]'A ring/Moderate gastritis/PERSISTENT Ulcer in the bulb/descending duodenum Stricture in the bulb/descending duodenum  . Gastrojejunostomy  06/28/2011    Procedure: GASTROJEJUNOSTOMY;  Surgeon: Fabio Bering, MD;  Location: [facility];  Service: General;  Laterality: N/A;  Roux-en-y, Choledocalduodenostomy  . Abdominal hysterectomy    . Kidney stone surgery    . Esophagogastroduodenoscopy Left 06/25/2012    RMR: Normal esophagus. Surgically altered anastomosis to  small intestine noted. Couple of  at the anastomosis.  Patent efferent limb Patient had an oozing 1 cm anastomotic ulcer  . Back surgery  2013   Social History:  She lives with her fianc. She does not smoke. Does not drink alcohol.  Allergies  Allergen Reactions  . Compazine Anaphylaxis  . Promethazine Hcl  Anaphylaxis  . Vistaril (Hydroxyzine Hcl) Other (See Comments)    Reaction: legs shake  . Benadryl (Diphenhydramine Hcl) Other (See Comments)    Pt states, "it makes my legs shake really bad"   . Gabapentin Other (See Comments)    Reactions: legs shake  . Latex Swelling    Family History  Problem Relation Age of Onset  . Colon cancer Neg Hx   . Liver cancer Neg Hx   . Breast cancer Neg Hx   . Inflammatory bowel disease Neg Hx   . Heart attack Mother       Prior to Admission medications   Medication Sig Start Date End Date Taking? Authorizing Provider  acetaminophen (TYLENOL) 325 MG tablet Take 2 tablets (650 mg total) by mouth every 6 (six) hours as needed. 07/11/12  Yes Christiane Ha, MD  ALPRAZolam Prudy Feeler) 0.5 MG tablet Take 0.5 mg by mouth 2 (two) times daily.   Yes Historical Provider, MD  lipase/protease/amylase (CREON-10/PANCREASE) 12000 UNITS CPEP Take 3 capsules by mouth 3 (three) times daily before meals. 06/29/12  Yes Suetta Hoffmeister C Karilyn Cota, MD  ondansetron (ZOFRAN) 4 MG tablet Take 1 tablet (4 mg total) by mouth every 6 (six) hours as needed for nausea. 07/11/12  Yes Christiane Ha, MD  oxyCODONE (ROXICODONE) 5 MG immediate release tablet Take 1 tablet (5 mg total) by mouth every 6 (six) hours as needed for pain. 07/11/12  Yes Christiane Ha, MD  sucralfate (CARAFATE) 1 GM/10ML suspension Take 10 mLs (1 g total) by mouth 4 (four) times daily -  with meals and at bedtime. 06/29/12  Yes Justa Hatchell Normajean Glasgow, MD  zolpidem (AMBIEN) 5 MG tablet Take 5 mg by mouth at bedtime as needed for sleep.   Yes Historical Provider, MD   Physical Exam: Filed Vitals:   08/11/12 1203 08/11/12 1204 08/11/12 1305 08/11/12 1434  BP: 147/112  146/98 174/97  Pulse: 116  90 77  Temp: 98.7 F (37.1 C)   99.6 F (37.6 C)  TempSrc: Oral Oral  Oral  Resp: 16  18   SpO2: 100%   97%     General:  She looks clinically dehydrated. She is somewhat cachectic.  Eyes: No jaundice. No pallor.  ENT:  No major abnormalities.  Neck: No lymphadenopathy.  Cardiovascular: Heart sounds are present and normal without murmurs or added sounds.  Respiratory: Lung fields are clear.  Abdomen: Soft, tender in a generalized fashion.. No masses.  Skin: Maculopapular rash over her trunk, likely related to intravenous hydromorphone.  Musculoskeletal: No acute joint abnormalities.  Psychiatric: Anxious.  Neurologic: Alert and orientated, no focal neurological signs.  Labs on Admission:  Basic Metabolic Panel:  Recent Labs Lab 08/11/12 1230 08/11/12 1339  NA 138  --   K 2.4*  --   CL 94*  --   CO2 27  --   GLUCOSE 144*  --   BUN 5*  --   CREATININE 0.51  --   CALCIUM 10.2  --   MG  --  1.9   Liver Function Tests:  Recent Labs Lab 08/11/12 1230  AST 56*  ALT 37*  ALKPHOS 189*  BILITOT 0.5  PROT 8.7*  ALBUMIN 4.8  Recent Labs Lab 08/11/12 1317  LIPASE 31    CBC:  Recent Labs Lab 08/11/12 1230  WBC 5.0  NEUTROABS 3.6  HGB 11.6*  HCT 37.9  MCV 71.8*  PLT 337     EKG: Independently reviewed. Normal sinus rhythm, no acute ST-T wave changes.  Assessment/Plan   1. Abdominal pain, recurrent. Unclear etiology. 2. Hypokalemia secondary to nausea and vomiting. 3. Weight loss. 4. Abnormal liver function tests.  Plan: 1. Admit to medical floor. 2. Replete potassium intravenously. 3. Intravenous fluids. 4. Gastroenterology consultation. 5. Surgical consultation with Dr. Leticia Penna. Further recommendations will depend on patient's hospital progress.  Code Status: Full code.   Family Communication: Discussed plan with patient at the bedside.  Disposition Plan: Home when medically stable.   Time spent: 45 minutes.  Wilson Singer [facility] Pager [phone].  If 7PM-7AM, please contact night-coverage [email or web address] Password [facility] 08/11/2012, 2:39 PM

## 2012-08-11 NOTE — ED Provider Notes (Signed)
History     CSN: [id number]  Arrival date & time 08/11/12  1159   First MD Initiated Contact with Patient 08/11/12 1303      Chief Complaint  Patient presents with  . Abdominal Pain    (Consider location/radiation/quality/duration/timing/severity/associated sxs/prior treatment) Patient is a 57 y.o. female presenting with abdominal pain. The history is provided by the patient.  Abdominal Pain Associated symptoms include abdominal pain. Pertinent negatives include no chest pain, no headaches and no shortness of breath.  pt c/o abdominal pain and nv for the past few days. No vomiting today but persistent pain and nausea. Denies bloody or bilious emesis.  Pain epigastric. Same as prior pain pt states has had numerous times in past. Hx pud, and ulcers at prior anastamotic site.  Pt w hx multiple prior abd surgeries incl cholecystectomy, appendectomy, gastrojejunostomy, and hystx.  Pt is having normal bms including today. No fever or chills. Denies cp or sob. No gu c/o.     Past Medical History  Diagnosis Date  . RA (rheumatoid arthritis)  2008  . MI (myocardial infarction) 2008    Not well documented, patient reports reassuring cardiac catheterization and stress testing in [place]  . Constipation 03/21/2011  . Hypokalemia 03/21/2011  . Fatty liver 03/21/2011  . Pancreatitis     states 3 years ago, very severe, ?biliary pancreatitis   . T12 compression fracture 2011  . Duodenal ulcer     remote per patient. +BC powders, patient report negative EGD six months ago  . Dilated pancreatic duct     ?chronic pancreatitis, EUS pending (06/02/11)  . Kidney stones   . Chronic abdominal pain     Past Surgical History  Procedure Laterality Date  . Cholecystectomy    . Knee surgery    . Appendectomy    . Complete hysterectomy    . Ventral wall hernia    . Esophagogastroduodenoscopy  08/2010    Dr. Samuella Cota, Versed. small hh, mild prepylori gastritis. path unavailable  .  Esophagogastroduodenoscopy  04/2010    Dr. Samuella Cota, Versed. small hh, mild distal esophagitis, antral gastritis and duodenitis. Bx mild chronic gastritis and no H.pylori  . Esophagogastroduodenoscopy  08/2009    Dr. Allena Katz, Versed. Moderate gastritis, moderate duodenitis with nodularity in proximal duodenal bulb, bx chronic gastritis, no helicobacter, mild chronic duodenitis  . Esophagogastroduodenoscopy  02/2007    Dr. Samuella Cota, Versed. antral gastritis and duodenal ulcers. Bx mild chronic gastritis. No bx from duodenal ulcer available.  Gaspar Bidding dilation  04/06/2011    Procedure: SAVORY DILATION;  Surgeon: Arlyce Harman, MD;  Location: [facility];  Service: Endoscopy;  Laterality: N/A;  16 mm  . Esophagogastroduodenoscopy  04/06/11    Dr. Darrick Penna: 1-2 cm hiatal hernia, mod gastritis, 75mmX3mm linear ulcer in duodenal bulb, stricture 1st part of duodenum, dilated to 12 mm  . Esophagogastroduodenoscopy  05/12/11    partial gastric oulet obstruction secondary to stricture between bulb/2nd portion of duodenum with marked friability and inflammation but no discrete ulcer, dilated stomach. s/p dilation but high risk for restenosis  . Esophagogastroduodenoscopy  06/22/2011    JXB:[id number]'O ring/Moderate gastritis/PERSISTENT Ulcer in the bulb/descending duodenum Stricture in the bulb/descending duodenum  . Gastrojejunostomy  06/28/2011    Procedure: GASTROJEJUNOSTOMY;  Surgeon: Fabio Bering, MD;  Location: [facility];  Service: General;  Laterality: N/A;  Roux-en-y, Choledocalduodenostomy  . Abdominal hysterectomy    . Kidney stone surgery    . Esophagogastroduodenoscopy Left 06/25/2012    RMR: Normal esophagus. Surgically  altered anastomosis to  small intestine noted. Couple of  at the anastomosis.  Patent efferent limb Patient had an oozing 1 cm anastomotic ulcer  . Back surgery  2013    Family History  Problem Relation Age of Onset  . Colon cancer Neg Hx   . Liver cancer Neg Hx   . Breast cancer Neg Hx   .  Inflammatory bowel disease Neg Hx   . Heart attack Mother     History  Substance Use Topics  . Smoking status: Never Smoker   . Smokeless tobacco: Not on file  . Alcohol Use: No    OB History   Grav Para Term Preterm Abortions TAB SAB Ect Mult Living                  Review of Systems  Constitutional: Negative for fever and chills.  HENT: Negative for neck pain.   Eyes: Negative for redness.  Respiratory: Negative for cough and shortness of breath.   Cardiovascular: Negative for chest pain.  Gastrointestinal: Positive for vomiting and abdominal pain. Negative for diarrhea and blood in stool.  Genitourinary: Negative for dysuria and flank pain.  Musculoskeletal: Negative for back pain.  Skin: Negative for rash.  Neurological: Negative for headaches.  Hematological: Does not bruise/bleed easily.  Psychiatric/Behavioral: Negative for confusion.    Allergies  Compazine; Promethazine hcl; Vistaril; Benadryl; Gabapentin; and Latex  Home Medications   Current Outpatient Rx  Name  Route  Sig  Dispense  Refill  . acetaminophen (TYLENOL) 325 MG tablet   Oral   Take 2 tablets (650 mg total) by mouth every 6 (six) hours as needed.         . ALPRAZolam (XANAX) 0.5 MG tablet   Oral   Take 0.5 mg by mouth 2 (two) times daily.         . lipase/protease/amylase (CREON-10/PANCREASE) 12000 UNITS CPEP   Oral   Take 3 capsules by mouth 3 (three) times daily before meals.   270 capsule   0   . ondansetron (ZOFRAN) 4 MG tablet   Oral   Take 1 tablet (4 mg total) by mouth every 6 (six) hours as needed for nausea.   20 tablet      . oxyCODONE (ROXICODONE) 5 MG immediate release tablet   Oral   Take 1 tablet (5 mg total) by mouth every 6 (six) hours as needed for pain.   10 tablet   0   . sucralfate (CARAFATE) 1 GM/10ML suspension   Oral   Take 10 mLs (1 g total) by mouth 4 (four) times daily -  with meals and at bedtime.   420 mL   0   . zolpidem (AMBIEN) 5 MG  tablet   Oral   Take 5 mg by mouth at bedtime as needed for sleep.           BP 146/98  Pulse 90  Temp(Src) 98.7 F (37.1 C) (Oral)  Resp 18  SpO2 100%  Physical Exam  Nursing note and vitals reviewed. Constitutional: She appears well-developed. No distress.  HENT:  Mouth/Throat: Oropharynx is clear and moist.  Eyes: Conjunctivae are normal. No scleral icterus.  Neck: Neck supple. No tracheal deviation present.  Cardiovascular: Regular rhythm, normal heart sounds and intact distal pulses.   Pulmonary/Chest: Effort normal and breath sounds normal. No respiratory distress.  Abdominal: Soft. Normal appearance and bowel sounds are normal. She exhibits no distension and no mass. There is tenderness. There is  no rebound and no guarding.  Epigastric tenderness. Multiple healed surgical scars, no incarcerated hernia.   Genitourinary:  No cva tenderness  Musculoskeletal: She exhibits no edema.  Neurological: She is alert.  Skin: Skin is warm and dry. No rash noted. She is not diaphoretic.  Psychiatric: She has a normal mood and affect.    ED Course  Procedures (including critical care time)   Results for orders placed during the hospital encounter of 08/11/12  CBC WITH DIFFERENTIAL      Result Value Range   WBC 5.0  4.0 - 10.5 K/uL   RBC 5.28 (*) 3.87 - 5.11 MIL/uL   Hemoglobin 11.6 (*) 12.0 - 15.0 g/dL   HCT 11.9  14.7 - 82.9 %   MCV 71.8 (*) 78.0 - 100.0 fL   MCH 22.0 (*) 26.0 - 34.0 pg   MCHC 30.6  30.0 - 36.0 g/dL   RDW 56.2  13.0 - 86.5 %   Platelets 337  150 - 400 K/uL   Neutrophils Relative % 72  43 - 77 %   Neutro Abs 3.6  1.7 - 7.7 K/uL   Lymphocytes Relative 22  12 - 46 %   Lymphs Abs 1.1  0.7 - 4.0 K/uL   Monocytes Relative 5  3 - 12 %   Monocytes Absolute 0.2  0.1 - 1.0 K/uL   Eosinophils Relative 0  0 - 5 %   Eosinophils Absolute 0.0  0.0 - 0.7 K/uL   Basophils Relative 1  0 - 1 %   Basophils Absolute 0.1  0.0 - 0.1 K/uL   RBC Morphology MIXED RBC  POPULATION    COMPREHENSIVE METABOLIC PANEL      Result Value Range   Sodium 138  135 - 145 mEq/L   Potassium 2.4 (*) 3.5 - 5.1 mEq/L   Chloride 94 (*) 96 - 112 mEq/L   CO2 27  19 - 32 mEq/L   Glucose, Bld 144 (*) 70 - 99 mg/dL   BUN 5 (*) 6 - 23 mg/dL   Creatinine, Ser 7.84  0.50 - 1.10 mg/dL   Calcium 69.6  8.4 - 29.5 mg/dL   Total Protein 8.7 (*) 6.0 - 8.3 g/dL   Albumin 4.8  3.5 - 5.2 g/dL   AST 56 (*) 0 - 37 U/L   ALT 37 (*) 0 - 35 U/L   Alkaline Phosphatase 189 (*) 39 - 117 U/L   Total Bilirubin 0.5  0.3 - 1.2 mg/dL   GFR calc non Af Amer >90  >90 mL/min   GFR calc Af Amer >90  >90 mL/min  LIPASE, BLOOD      Result Value Range   Lipase 31  11 - 59 U/L   Dg Ugi W/small Bowel High Density  07/18/2012   *RADIOLOGY REPORT*  Clinical Data:Prior gastrojejunostomy.  Persistent abdominal pain. Vomiting.  Evaluate for small bowel adhesions.  UPPER GI W/ SMALL BOWEL HIGH DENSITY  Technique: Upper GI series performed with high density barium and effervescent agent. Thin barium also used. Subsequently, serial images of the small bowel were obtained including spot views of the terminal ileum.  Fluoroscopy Time: 2 minutes and 12 seconds  Comparison: Plain films of 07/10/2012 and CT of 06/28/2012.  Findings: Pre procedure scout film demonstrates a nonobstructive bowel gas pattern.  Surgical clips in the right upper quadrant and sutures in the left mid abdomen.  Double contrast upper GI images demonstrate surgical changes of gastrojejunostomy.  No filling defect or ulcer identified.  The proximal stomach is suboptimally evaluated, presumably well visualized on the patient's recent endoscopy.  Small follow-through images demonstrate mild proximal to mid small bowel dilatation, at up to 4.4 cm.  This is most apparent on the initial images, and favored to be be transient.  The more distal small bowel is normal in caliber.  No delay in contrast passage.  Normal small bowel fold pattern.  Normal small bowel  transit time, approximately 2 hours and 30 minutes.  Normal terminal ileum.  IMPRESSION:  1.  Postsurgical changes of gastrojejunostomy, without acute finding in the stomach. 2.  Proximal small bowel dilatation, mild.  This is most apparent on initial images and is favored to be transient.  Although a component of low grade obstruction cannot be excluded (given normal caliber of mid to distal small bowel loops) there is no evidence of high-grade obstruction or delayed contrast passage.   Original Report Authenticated By: Jeronimo Greaves, M.D.      MDM  Iv ns bolus. Labs. zofran iv. protonix iv. Dilaudid iv.   Reviewed nursing notes and prior charts for additional history.   Pt with recent ugi and ct to evaluate same symptoms - neg acute.  Additional ivf. k very low. Monitor, ecg.   kcl iv x 3 runs, each over 1 hr.   Given nv, pain, v low k, med service to admit.     Date: 08/11/2012  Rate: 72  Rhythm: normal sinus rhythm  QRS Axis: normal  Intervals: normal  ST/T Wave abnormalities: ?u wave  Conduction Disutrbances:none  Narrative Interpretation:   Old EKG Reviewed: changes noted       Suzi Roots, MD 08/11/12 1358

## 2012-08-11 NOTE — ED Notes (Signed)
Ongoing abdominal pain N/V/D since admission 3 weeks ago.

## 2012-08-11 NOTE — Progress Notes (Signed)
08/11/12 1721 Discussed enteric precautions with patient. Verbalized understanding, instructed to notify nursing if any stool. Stated would do so. Earnstine Regal, RN

## 2012-08-11 NOTE — Progress Notes (Signed)
08/11/12 1651 Patient stated has been having diarrhea at home for the past 4 days. Patient c/o nausea as well, not yet time for zofran. Notified Dr Karilyn Cota, stated would adjust zofran to every 6 hours PRN and order placed for c-diff PCR. Enteric precautions in place. Earnstine Regal, RN

## 2012-08-11 NOTE — Telephone Encounter (Signed)
Pt called and said she has had vomiting and diarrhea x 4 days. She has abdominal pain to the right of umbilicus and she said it was hurting so bad she could hardly stand it. She rated the pain at a 9 now. I told her if she was hurting that bad that Dr. Darrick Penna would tell her to go to the ED. She said that she didn't want to go because of how she was treated the last time, and I told her she needed to go if the pain is that bad. She said she would be leaving [place] soon and come to the ED.

## 2012-08-12 ENCOUNTER — Inpatient Hospital Stay (HOSPITAL_COMMUNITY)

## 2012-08-12 LAB — CBC
MCH: 22.4 pg — ABNORMAL LOW (ref 26.0–34.0)
Platelets: 228 10*3/uL (ref 150–400)
RBC: 4.15 MIL/uL (ref 3.87–5.11)
RDW: 15.3 % (ref 11.5–15.5)
WBC: 4.2 10*3/uL (ref 4.0–10.5)

## 2012-08-12 LAB — COMPREHENSIVE METABOLIC PANEL
AST: 33 U/L (ref 0–37)
Alkaline Phosphatase: 142 U/L — ABNORMAL HIGH (ref 39–117)
BUN: <3 mg/dL — ABNORMAL LOW (ref 6–23)
CO2: 25 mEq/L (ref 19–32)
Chloride: 106 mEq/L (ref 96–112)
Creatinine, Ser: 0.46 mg/dL — ABNORMAL LOW (ref 0.50–1.10)
GFR calc non Af Amer: >90 mL/min (ref >90)
Potassium: 4.1 mEq/L (ref 3.5–5.1)
Total Bilirubin: 0.4 mg/dL (ref 0.3–1.2)

## 2012-08-12 LAB — TSH: TSH: 0.52 u[IU]/mL (ref 0.350–4.500)

## 2012-08-12 MED ORDER — DICYCLOMINE HCL 10 MG PO CAPS
10.0000 mg | ORAL_CAPSULE | Freq: Three times a day (TID) | ORAL | Status: DC
  Administered 2012-08-13 – 2012-08-14 (×4): 10 mg via ORAL
  Filled 2012-08-12 (×4): qty 1

## 2012-08-12 MED ORDER — HYDROMORPHONE HCL PF 1 MG/ML IJ SOLN
1.0000 mg | INTRAMUSCULAR | Status: DC | PRN
  Administered 2012-08-12 – 2012-08-13 (×7): 1 mg via INTRAVENOUS
  Filled 2012-08-12 (×7): qty 1

## 2012-08-12 MED ORDER — IOHEXOL 300 MG/ML  SOLN
100.0000 mL | Freq: Once | INTRAMUSCULAR | Status: AC | PRN
  Administered 2012-08-12: 100 mL via INTRAVENOUS

## 2012-08-12 MED ORDER — BOOST / RESOURCE BREEZE PO LIQD
1.0000 | Freq: Three times a day (TID) | ORAL | Status: DC
  Administered 2012-08-12 – 2012-08-20 (×20): 1 via ORAL

## 2012-08-12 MED ORDER — HYDROMORPHONE HCL PF 1 MG/ML IJ SOLN
1.0000 mg | Freq: Once | INTRAMUSCULAR | Status: AC
  Administered 2012-08-12: 1 mg via INTRAVENOUS
  Filled 2012-08-12: qty 1

## 2012-08-12 MED ORDER — FERUMOXYTOL INJECTION 510 MG/17 ML
510.0000 mg | Freq: Once | INTRAVENOUS | Status: AC
  Administered 2012-08-12: 510 mg via INTRAVENOUS
  Filled 2012-08-12: qty 17

## 2012-08-12 MED ORDER — IOHEXOL 300 MG/ML  SOLN
50.0000 mL | Freq: Once | INTRAMUSCULAR | Status: AC | PRN
  Administered 2012-08-12: 50 mL via ORAL

## 2012-08-12 NOTE — Progress Notes (Signed)
No abnormality noted lungs CT to account for patient's symptoms. She has large amount of gas in the colon. Begin dicyclomine 10 mg by mouth 3 times a day. Celiac antibody panel

## 2012-08-12 NOTE — Consult Note (Signed)
NAME:  ELLOISE, ROARK NO.:  [id number]  MEDICAL RECORD NO.:  [id number]  LOCATION:  A322                          FACILITY:  [facility]  PHYSICIAN:  Lionel December, M.D.    DATE OF BIRTH:  [birth year]  DATE OF CONSULTATION: DATE OF DISCHARGE:                                CONSULTATION   CONSULTING PHYSICIAN:  Dr. Wilson Singer.  PRIMARY GASTROENTEROLOGIST:  Dr. Jonette Eva, and Dr. Roetta Sessions.  REASON FOR CONSULTATION:  Abdominal pain, nausea, vomiting, diarrhea, and weight loss.  HISTORY OF PRESENT ILLNESS:  The patient is a 57 year old Caucasian female with complicated history of peptic ulcer disease which is reviewed under past medical history, was hospitalized in April 2014, with upper GI bleed.  She underwent EGD by Dr. Jena Gauss on June 25, 2012, and noted to have oozing ulcer at gastrojejunostomy.  This ulcer was treated with dilute epi and gold probe with hemostasis.  The patient states she did well for about 1 week or so.  Then, she began to have pain primarily in right lower quadrant of her abdomen, associated with nausea, vomiting, and diarrhea.  She describes pain as pulling and extreme given the sensation that she would pass out.  Her diarrhea has been intermittent.  She is passing watery stools and diarrhea is preceded by severe pain.  She would go few days without a bowel movement; however, she has not been able to eat much.  She has lost 20 pounds in the last few weeks.  She states all in all, she has lost 50 pounds since her gastric surgery in April, 2013.  She denies hematemesis, melena, or rectal bleeding.  She also denies fever, chills, hematuria, dysuria.  She tells me she does not take any NSAIDs.  She describes this pain to be different pain than she has had associated with complicated peptic ulcer disease, which will be in epigastrium. Presently, she is not having any pain in epigastrium.  She also complains of desire due to ice, so much  so that she bought [employer] machine to make ice.  Her fiance has been concerned that she has OCD or psychiatric problem.  The patient has been evaluated by Dr. Lovell Sheehan, and he feels that there is no obvious surgical disease.  HOME MEDICATIONS: 1. Nexium 40 mg p.o. q.a.m. 2. Alprazolam 0.5 mg p.o. b.i.d. 3. Ondansetron 4 mg every 6 hours as needed. 4. Acetaminophen 650 mg p.o. q.6 p.r.n. 5. Creon 12,000 three capsules with each meal. 6. Oxycodone 5 mg q.6 h. p.r.n. 7. Sucralfate 1 g p.o. q.i.d. 8. Humira question dose 40 mg subcu every 2 weeks.  CURRENT MEDICATIONS: 1. She is on IV fluid with KCl. 2. Heparin 5000 units subcu q.8 h. 3. Hydromorphone 1 mg every 4 hours as needed. 4. Ondansetron 4 mg IV q.6 p.r.n. 5. Oxycodone immediate release 5 mg p.o. q.6 p.r.n. 6. Creon 12 three capsules with each meal.  PAST MEDICAL HISTORY:  Rheumatoid arthritis was diagnosed about 10 years ago.  She has been on Humira for several years, and she is under care of Dr. Octaviano Glow of [facility]  She apparently suffered mild myocardial infarct when she had "ruptured gallbladder," but  subsequent cardiac cath was normal.  History of biliary pancreatitis for which she was treated at in [facility].  This possibly was in December 2012.  She is presumed to have chronic pancreatitis based on somewhat dilated pancreatic duct.  EUS was planned, but not done yet.  She has history of fatty liver.  Complicated history of peptic ulcer disease.  This apparently started 3 or 4 years ago and she had multiple EGDs by Dr. Glade Lloyd of [facility].  She was finally evaluated by Dr. Jonette Eva, when she presented with upper GI bleed in February, 2013, and found to have large duodenal ulcer with stricture.  She did have 2 units of PRBCs.  She apparently had reaction with third unit, which was discontinued. Followup EGD in April, 2013, revealed nonhealing ulcer.  She went on to have to have surgery by  Dr. Lorina Rabon, which was in April, 2013.  She had open antrectomy with duodenectomy, Roux-en-Y gastrojejunostomy and she also had injury to bile duct, and therefore had choledochoduodenostomy.  She is status post cholecystectomy.  She had right knee arthroscopy several years ago at [facility] in order to promote growth of cartilage, but this did not work.  She had BSO with hysterectomy with incidental appendectomy in 1991.  She had large ovarian cyst and excessive bleeding.  She had repair of ventral hernia in 1981.  She has osteoporosis.  She sustained fracture to T12 in October, 2013, and had kyphoplasty.  She also had removal of left saphenous vein due to retrograde flow in 1998.  Her gastrin level last year was normal.  Her H. pylori serology has been negative.  FAMILY HISTORY:  Mother died of MI at age 28.  She has sudden death and she had autopsy confirming this diagnosis.  Father has coronary artery disease and has CABG and is doing fair at age 35.  She has sister with unknown heart problems.  She is 57 years old.  She has 2 brothers, age 56 and 3, in good health.  SOCIAL HISTORY:  She is divorced, but engaged to be married again.  She has a grown up daughter in good health and she has 2 grandchildren.  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  She is presently staying with her fiance and she has plenty of help at home.  PHYSICAL EXAMINATION:  VITAL SIGNS:  Admission weight 98 pounds and 12 ounces.  She is 64 inches tall.  Pulse 67 per minute and regular, blood pressure 134/80, temp is 97.9, respirations 20. HEENT:  Conjunctivae are pale.  Sclerae are nonicteric.  Oropharyngeal mucosa is normal.  No neck masses or thyromegaly noted. CARDIAC:  Regular rate and rhythm.  Normal S1 and S2.  No murmur or gallop noted. LUNGS:  Clear to auscultation. ABDOMEN:  Symmetrical.  Bowel sounds are hyperactive.  No bruits noted. On palpation, soft  abdomen.  Generalized tenderness, which is most pronounced in right lower quadrant with some guarding, but no rebound noted.  No organomegaly or masses.  No edema or clubbing noted.  LABORATORY DATA:  Upper GI with small bowel study reviewed from July 18, 2012, evidence of gastrojejunostomy.  Mild proximal bowel dilation, but no transition noted.  Blood work from admission WBC 5.0, H and H 11.6 and 37.9, MCV 71.8.  Serum sodium 138, potassium 2.4, chloride 94, CO2 of 27, glucose 144, BUN 5, creatinine 0.51, calcium 10.8, albumin 4.8, bilirubin 0.5, AP 189, AST 56, ALT 37.  H and H from this morning 9.3 and 30.1.  C. diff by PCR is negative.  ASSESSMENT:  The patient is a 57 year old Caucasian female with complicated GI history, who now presents with 3-week history of abdominal pain associated with nausea, vomiting, and nonbloody diarrhea.  On admission, she was noted to be dehydrated with hypokalemia.  Her hemoglobin has dropped by more than 2 g post hydration.  No evidence of active bleeding.  She has low MCV and previously has been documented to have iron deficiency anemia.  Her symptom complex is suspicious for intermittent partial small bowel obstruction.  Recent small bowel study shows some proximal small bowel dilation, but no transition zone.  Small bowel needs to be re-evaluated preferably with CT as we can survey rest of her abdomen.  Iron-deficiency anemia, possibly secondary to history of prior gastrointestinal bleed and she may have impaired iron absorption. Also need to rule out celiac disease.    RECOMMENDATIONS:  Abdominopelvic CT with contrast.  Hemoccult x1.  Feraheme 510 mg IV infusion today and she could be given another 1 prior to discharge.  Further recommendations to follow.  We appreciate the opportunity to participate in the care of this nice lady.  I will be seeing the patient over the weekend with you until Dr. Jena Gauss, returns next week.           ______________________________ Lionel December, M.D.     NR/MEDQ  D:  08/12/2012  T:  08/12/2012  Job:  [id number]  cc:   Jonette Eva, M.D. [address] [place] , [place] [address]  R. Roetta Sessions, MD FACP [facility] [address] [place] [address]

## 2012-08-12 NOTE — Consult Note (Signed)
Full note to be dictated. Patient is 57 year old female with complicated GI history who presents with six-week history of right-sided lower abdominal pain associated with nausea vomiting and diarrhea. This symptom complex is suggestive of intermittent small bowel obstruction. She could have high-grade stricture or intussusception. She also has iron deficiency anemia. She has craving for ice so that she bought ice making machine. Suspect iron deficiency anemia is chronic posterior to poor iron absorption due to PPI therapy. She did receive iron infusion 3 years ago. Recommendations; Abdominopelvic CT with contrast if possible. Hemoccult x1 Feraheme 510 mg IV infusion today dose to be repeated before she goes home. She will also need colonoscopy when acute symptomatology has resolved.

## 2012-08-12 NOTE — Progress Notes (Addendum)
     Subjective: This lady feels somewhat better than yesterday but still remains in pain . She feels thankful that Dr. Lovell Sheehan, surgery has seen her. She does not feel that intravenous morphine did help her and she feels that intravenous hydromorphone did. However she got her a yesterday the emergency room, may be related to hydromorphone. I told her we will try hydromorphone again to see what happens.           Physical Exam: Blood pressure 142/98, pulse 64, temperature 98.4 F (36.9 C), temperature source Oral, resp. rate 20, height 5\' 4"  (1.626 m), weight 44.8 kg (98 lb 12.3 oz), SpO2 100.00%. She looks better than yesterday. She is still cachectic. Abdomen is soft and the right side of the abdomen is tender. She is alert and oriented   Investigations:  Recent Results (from the past 240 hour(s))  CLOSTRIDIUM DIFFICILE BY PCR     Status: None   Collection Time    08/11/12 10:09 PM      Result Value Range Status   C difficile by pcr NEGATIVE  NEGATIVE Final     Basic Metabolic Panel:  Recent Labs  16/10/96 1230 08/11/12 1339  NA 138  --   K 2.4*  --   CL 94*  --   CO2 27  --   GLUCOSE 144*  --   BUN 5*  --   CREATININE 0.51  --   CALCIUM 10.2  --   MG  --  1.9   Liver Function Tests:  Recent Labs  08/11/12 1230  AST 56*  ALT 37*  ALKPHOS 189*  BILITOT 0.5  PROT 8.7*  ALBUMIN 4.8     CBC:  Recent Labs  08/11/12 1230  WBC 5.0  NEUTROABS 3.6  HGB 11.6*  HCT 37.9  MCV 71.8*  PLT 337        Medications: I have reviewed the patient's current medications.  Impression: 1. Chronic recurrent abdominal pain, nausea and vomiting. Etiology is unclear. 2. Significant weight loss. 3. Previous history of Roux-en-Y procedure uncomplicated surgery in April 2013. 4. Abnormal liver enzymes.     Plan: 1. Appreciate surgery input. 2. Appreciate gastroenterology input. 3. We'll try intravenous hydromorphone for pain for the time being. I do not  believe she has drug-seeking behavior.     LOS: 1 day   Wilson Singer Pager [phone]  08/12/2012, 8:30 AM

## 2012-08-12 NOTE — Progress Notes (Signed)
08/12/12 1944 Patient reported having several loose, watery stools this evening after supper. Stool orange/brown color, mostly watery. Pt requested medication to reduce loose stools if possible. Notified Dr Karilyn Cota of request, stated loose stools most likely d/t contrast received and only having clear liquids. Stated would see patient and address needs. Pt seen by Dr Karilyn Cota this evening. Earnstine Regal, RN

## 2012-08-12 NOTE — Consult Note (Signed)
Reason for Consult: Abdominal pain Referring Physician: [facility]  Jordan Haney is an 57 y.o. female.  HPI: Patient is a 57 year old white female status post a Roux-en-Y antrectomy and choledochojejunostomy in April 2013 by Dr. Leticia Penna who presents with a several month history of worsening right lower quadrant abdominal pain, nausea, and weight loss. She states after her rituals surgery she was well for a good 10 months. She is had significant weight loss recently and complains of right lower corner abdominal pain. She has had an EGD which showed a small ulceration around the gastrojejunostomy site. She had a CT scan of the abdomen and pelvis which was unremarkable for any acute pathology. Upper GI series did not reveal any high grade obstructive process. She states her right lower quadrant pain is intermittent in nature. She does vary between constipation and diarrhea. She has had an appendectomy and hysterectomy in the remote past.  Past Medical History  Diagnosis Date  . RA (rheumatoid arthritis)  2008  . MI (myocardial infarction) 2008    Not well documented, patient reports reassuring cardiac catheterization and stress testing in [place]  . Constipation 03/21/2011  . Hypokalemia 03/21/2011  . Fatty liver 03/21/2011  . Pancreatitis     states 3 years ago, very severe, ?biliary pancreatitis   . T12 compression fracture 2011  . Duodenal ulcer     remote per patient. +BC powders, patient report negative EGD six months ago  . Dilated pancreatic duct     ?chronic pancreatitis, EUS pending (06/02/11)  . Kidney stones   . Chronic abdominal pain     Past Surgical History  Procedure Laterality Date  . Cholecystectomy    . Knee surgery    . Appendectomy    . Complete hysterectomy    . Ventral wall hernia    . Esophagogastroduodenoscopy  08/2010    Dr. Samuella Cota, [place]. small hh, mild prepylori gastritis. path unavailable  . Esophagogastroduodenoscopy  04/2010    Dr. Samuella Cota,  [place]ed. small hh, mild distal esophagitis, antral gastritis and duodenitis. Bx mild chronic gastritis and no H.pylori  . Esophagogastroduodenoscopy  08/2009    Dr. Allena Katz, [place]. Moderate gastritis, moderate duodenitis with nodularity in proximal duodenal bulb, bx chronic gastritis, no helicobacter, mild chronic duodenitis  . Esophagogastroduodenoscopy  02/2007    Dr. Samuella Cota, Versed. antral gastritis and duodenal ulcers. Bx mild chronic gastritis. No bx from duodenal ulcer available.  Gaspar Bidding dilation  04/06/2011    Procedure: SAVORY DILATION;  Surgeon: Arlyce Harman, MD;  Location: [facility];  Service: Endoscopy;  Laterality: N/A;  16 mm  . Esophagogastroduodenoscopy  04/06/11    Dr. Darrick Penna: 1-2 cm hiatal hernia, mod gastritis, 44mmX3mm linear ulcer in duodenal bulb, stricture 1st part of duodenum, dilated to 12 mm  . Esophagogastroduodenoscopy  05/12/11    partial gastric oulet obstruction secondary to stricture between bulb/2nd portion of duodenum with marked friability and inflammation but no discrete ulcer, dilated stomach. s/p dilation but high risk for restenosis  . Esophagogastroduodenoscopy  06/22/2011    WUJ:[id number] ring/Moderate gastritis/PERSISTENT Ulcer in the bulb/descending duodenum Stricture in the bulb/descending duodenum  . Gastrojejunostomy  06/28/2011    Procedure: GASTROJEJUNOSTOMY;  Surgeon: Fabio Bering, MD;  Location: [facility];  Service: General;  Laterality: N/A;  Roux-en-y, Choledocalduodenostomy  . Abdominal hysterectomy    . Kidney stone surgery    . Esophagogastroduodenoscopy Left 06/25/2012    RMR: Normal esophagus. Surgically altered anastomosis to  small intestine noted. Couple of  at the  anastomosis.  Patent efferent limb Patient had an oozing 1 cm anastomotic ulcer  . Back surgery  2013    Family History  Problem Relation Age of Onset  . Colon cancer Neg Hx   . Liver cancer Neg Hx   . Breast cancer Neg Hx   . Inflammatory bowel disease Neg Hx   . Heart attack  Mother     Social History:  reports that she has never smoked. She does not have any smokeless tobacco history on file. She reports that she does not drink alcohol or use illicit drugs.  Allergies:  Allergies  Allergen Reactions  . Compazine Anaphylaxis  . Promethazine Hcl Anaphylaxis  . Vistaril (Hydroxyzine Hcl) Other (See Comments)    Reaction: legs shake  . Benadryl (Diphenhydramine Hcl) Other (See Comments)    Pt states, "it makes my legs shake really bad"   . Gabapentin Other (See Comments)    Reactions: legs shake  . Latex Swelling    Medications: I have reviewed the patient's current medications.  Results for orders placed during the hospital encounter of 08/11/12 (from the past 48 hour(s))  CBC WITH DIFFERENTIAL     Status: Abnormal   Collection Time    08/11/12 12:30 PM      Result Value Range   WBC 5.0  4.0 - 10.5 K/uL   RBC 5.28 (*) 3.87 - 5.11 MIL/uL   Hemoglobin 11.6 (*) 12.0 - 15.0 g/dL   HCT 84.6  96.2 - 95.2 %   MCV 71.8 (*) 78.0 - 100.0 fL   MCH 22.0 (*) 26.0 - 34.0 pg   MCHC 30.6  30.0 - 36.0 g/dL   RDW 84.1  32.4 - 40.1 %   Platelets 337  150 - 400 K/uL   Neutrophils Relative % 72  43 - 77 %   Neutro Abs 3.6  1.7 - 7.7 K/uL   Lymphocytes Relative 22  12 - 46 %   Lymphs Abs 1.1  0.7 - 4.0 K/uL   Monocytes Relative 5  3 - 12 %   Monocytes Absolute 0.2  0.1 - 1.0 K/uL   Eosinophils Relative 0  0 - 5 %   Eosinophils Absolute 0.0  0.0 - 0.7 K/uL   Basophils Relative 1  0 - 1 %   Basophils Absolute 0.1  0.0 - 0.1 K/uL   RBC Morphology MIXED RBC POPULATION    COMPREHENSIVE METABOLIC PANEL     Status: Abnormal   Collection Time    08/11/12 12:30 PM      Result Value Range   Sodium 138  135 - 145 mEq/L   Potassium 2.4 (*) 3.5 - 5.1 mEq/L   Comment: CRITICAL RESULT CALLED TO, READ BACK BY AND VERIFIED WITH:     YOUNG,J AT 1320 BY HUFFINES,S ON 08/11/12   Chloride 94 (*) 96 - 112 mEq/L   CO2 27  19 - 32 mEq/L   Glucose, Bld 144 (*) 70 - 99 mg/dL   BUN  5 (*) 6 - 23 mg/dL   Creatinine, Ser 0.27  0.50 - 1.10 mg/dL   Calcium 25.3  8.4 - 66.4 mg/dL   Total Protein 8.7 (*) 6.0 - 8.3 g/dL   Albumin 4.8  3.5 - 5.2 g/dL   AST 56 (*) 0 - 37 U/L   ALT 37 (*) 0 - 35 U/L   Alkaline Phosphatase 189 (*) 39 - 117 U/L   Total Bilirubin 0.5  0.3 - 1.2 mg/dL   GFR  calc non Af Amer >90  >90 mL/min   GFR calc Af Amer >90  >90 mL/min   Comment:            The eGFR has been calculated     using the CKD EPI equation.     This calculation has not been     validated in all clinical     situations.     eGFR's persistently     <90 mL/min signify     possible Chronic Kidney Disease.  HEMOGLOBIN A1C     Status: None   Collection Time    08/11/12 12:30 PM      Result Value Range   Hemoglobin A1C 5.1  <5.7 %   Comment: (NOTE)                                                                               According to the ADA Clinical Practice Recommendations for 2011, when     HbA1c is used as a screening test:      >=6.5%   Diagnostic of Diabetes Mellitus               (if abnormal result is confirmed)     5.7-6.4%   Increased risk of developing Diabetes Mellitus     References:Diagnosis and Classification of Diabetes Mellitus,Diabetes     Care,2011,34(Suppl 1):S62-S69 and Standards of Medical Care in             Diabetes - 2011,Diabetes Care,2011,34 (Suppl 1):S11-S61.     CORRECTED ON 05/24 AT 0146: PREVIOUSLY REPORTED AS 5.1 Reference range: <5.7   Mean Plasma Glucose 100  <117 mg/dL  LIPASE, BLOOD     Status: None   Collection Time    08/11/12  1:17 PM      Result Value Range   Lipase 31  11 - 59 U/L  MAGNESIUM     Status: None   Collection Time    08/11/12  1:39 PM      Result Value Range   Magnesium 1.9  1.5 - 2.5 mg/dL  TSH     Status: None   Collection Time    08/11/12  3:05 PM      Result Value Range   TSH 0.520  0.350 - 4.500 uIU/mL  TESTOSTERONE     Status: None   Collection Time    08/11/12  3:05 PM      Result Value Range    Testosterone 19  10 - 70 ng/dL   Comment: (NOTE)              Tanner Stage       Female              Female                  I              < 30 ng/dL        < 10 ng/dL                  II             < 150 ng/dL       <  30 ng/dL                  III            100-320 ng/dL     < 35 ng/dL                  IV             200-970 ng/dL     91-47 ng/dL                  V/Adult        300-890 ng/dL     82-95 ng/dL  FOLLICLE STIMULATING HORMONE     Status: None   Collection Time    08/11/12  3:05 PM      Result Value Range   FSH 79.3     Comment: (NOTE)     Reference Ranges:             Female:                         1.4 -  18.1 mIU/mL             Female:   Follicular Phase    2.5 -  10.2 mIU/mL                       MidCycle Peak       3.4 -  33.4 mIU/mL                       Luteal Phase        1.5 -   9.1 mIU/mL                       Post Menopausal    23.0 - 116.3 mIU/mL                       Pregnant                <   0.3 mIU/mL  LUTEINIZING HORMONE     Status: None   Collection Time    08/11/12  3:05 PM      Result Value Range   LH 22.6     Comment: (NOTE)     Reference Ranges:             Female:     20 - 70 Years           1.5 -  9.3 mIU/mL                          > 70 Years           3.1 - 34.6 mIU/mL             Female:   Follicular Phase        1.9 - 12.5 mIU/mL                       Midcycle                8.7 - 76.3 mIU/mL                       Luteal Phase            0.5 - 16.9 mIU/mL  Post Menopausal        15.9 - 54.0 mIU/mL                       Pregnant                    <  1.5 mIU/mL                       Contraceptives          0.7 -  5.6 mIU/mL             Children:                             <  6.0 mIU/mL  URINALYSIS, ROUTINE W REFLEX MICROSCOPIC     Status: Abnormal   Collection Time    08/11/12  9:43 PM      Result Value Range   Color, Urine YELLOW  YELLOW   APPearance CLEAR  CLEAR   Specific Gravity, Urine <1.005 (*) 1.005 - 1.030    pH 6.5  5.0 - 8.0   Glucose, UA NEGATIVE  NEGATIVE mg/dL   Hgb urine dipstick NEGATIVE  NEGATIVE   Bilirubin Urine NEGATIVE  NEGATIVE   Ketones, ur NEGATIVE  NEGATIVE mg/dL   Protein, ur NEGATIVE  NEGATIVE mg/dL   Urobilinogen, UA 0.2  0.0 - 1.0 mg/dL   Nitrite NEGATIVE  NEGATIVE   Leukocytes, UA NEGATIVE  NEGATIVE   Comment: MICROSCOPIC NOT DONE ON URINES WITH NEGATIVE PROTEIN, BLOOD, LEUKOCYTES, NITRITE, OR GLUCOSE <1000 mg/dL.  CLOSTRIDIUM DIFFICILE BY PCR     Status: None   Collection Time    08/11/12 10:09 PM      Result Value Range   C difficile by pcr NEGATIVE  NEGATIVE    No results found.  ROS: See chart Blood pressure 142/98, pulse 64, temperature 98.4 F (36.9 C), temperature source Oral, resp. rate 20, height 5\' 4"  (1.626 m), weight 44.8 kg (98 lb 12.3 oz), SpO2 100.00%. Physical Exam: Pleasant white female who appears fatigued, but no acute distress. Abdomen: Soft, flat. Well-healed surgical scars noted. Does have point tenderness in the right lower quadrant close to a right lower quadrant surgical scar, though I could not appreciate any hernia. No masses noted.  Assessment/Plan: Impression: Abdominal pain and weight loss of unknown etiology. Patient most certainly has adhesive disease within the abdomen, though she has no obstructive process present. I could not find record of a recent colonoscopy and this may be warranted. No need for acute surgical intervention at this time. She does have a complex problem, for which this may be multifactorial in nature.  Raphel Stickles A 08/12/2012, 8:37 AM

## 2012-08-12 NOTE — Progress Notes (Signed)
INITIAL NUTRITION ASSESSMENT  DOCUMENTATION CODES Per approved criteria  -Severe malnutrition in the context of chronic illness   INTERVENTION: Resource Breeze po TID, each supplement provides 250 kcal and 9 grams of protein.  NUTRITION DIAGNOSIS: Inadequate oral food intake related to altered GI function, wt loss as evidenced by chronic n/v, abdominal pain, 7.5% wt loss x 1 month.   Goal: Pt will meet >/= 90% of estimated energy needs.   Monitor:  Diet advancement, PO intake, labs, wt changes, skin integrity, changes in status  Reason for Assessment: MST=5  57 y.o. female  Admitting Dx: <principal problem not specified>  ASSESSMENT: Pt familiar due to previous admissions. She is admitted from dehydration and hypokalemia. Noted n/v/d and abdominal pain for the past 3 weeks. Recent admission on 07/10/12 reveals that pt was diagnosed with moderate malnutrition, due to poor po intake and wt loss.  PO intake remains poor due to altered GI function. Pt is currently on a clear liquid diet and tolerating well since admission; noted PO: 75%.  Wt hx reveals 26.3% wt loss x 1 year and a 7.5% wt loss x 1 month, both of which are clinically significant.  Unable to perform physical exam, as pt was unavailable during time of visit.  Pt meets criteria for severe MALNUTRITION in the context of chronic illness as evidenced by <75% of estimated energy needs mets x 1 month and 26.3% wt loss x 1 year.  Height: Ht Readings from Last 1 Encounters:  08/11/12 5\' 4"  (1.626 m)    Weight: Wt Readings from Last 1 Encounters:  08/11/12 98 lb 12.3 oz (44.8 kg)    Ideal Body Weight: 120#  % Ideal Body Weight: 82%  Wt Readings from Last 10 Encounters:  08/11/12 98 lb 12.3 oz (44.8 kg)  07/10/12 106 lb 3.2 oz (48.172 kg)  06/24/12 111 lb 1.8 oz (50.4 kg)  06/24/12 111 lb 1.8 oz (50.4 kg)  06/22/12 102 lb 12.8 oz (46.63 kg)  10/14/11 125 lb (56.7 kg)  10/13/11 120 lb (54.432 kg)  10/08/11 125 lb  (56.7 kg)  09/19/11 125 lb (56.7 kg)  09/16/11 125 lb (56.7 kg)    Usual Body Weight: 151#  % Usual Body Weight: 65%  BMI:  Body mass index is 16.94 kg/(m^2). Meets criteria for underweight.   Estimated Nutritional Needs: Kcal: 0865-7846 daily Protein: 56-67 grams daily Fluid: 1.3-1.6 L daily  Skin: Intact  Diet Order: Clear Liquid  EDUCATION NEEDS: -Education not appropriate at this time   Intake/Output Summary (Last 24 hours) at 08/12/12 1254 Last data filed at 08/12/12 0924  Gross per 24 hour  Intake 2614.67 ml  Output    400 ml  Net 2214.67 ml    Last BM: 08/12/12   Labs:   Recent Labs Lab 08/11/12 1230 08/11/12 1339 08/12/12 0856  NA 138  --  139  K 2.4*  --  4.1  CL 94*  --  106  CO2 27  --  25  BUN 5*  --  <3*  CREATININE 0.51  --  0.46*  CALCIUM 10.2  --  9.1  MG  --  1.9  --   GLUCOSE 144*  --  145*    CBG (last 3)  No results found for this basename: GLUCAP,  in the last 72 hours  Scheduled Meds: . ALPRAZolam  0.5 mg Oral BID  . heparin  5,000 Units Subcutaneous Q8H  . lipase/protease/amylase  3 capsule Oral TID AC  . ondansetron (  ZOFRAN) IV  4 mg Intravenous Once  . sodium chloride  1,000 mL Intravenous Once  . sodium chloride  3 mL Intravenous Q12H  . sucralfate  1 g Oral TID WC & HS    Continuous Infusions: . 0.9 % NaCl with KCl 40 mEq / L 100 mL/hr at 08/12/12 1610    Past Medical History  Diagnosis Date  . RA (rheumatoid arthritis)  2008  . MI (myocardial infarction) 2008    Not well documented, patient reports reassuring cardiac catheterization and stress testing in [place]  . Constipation 03/21/2011  . Hypokalemia 03/21/2011  . Fatty liver 03/21/2011  . Pancreatitis     states 3 years ago, very severe, ?biliary pancreatitis   . T12 compression fracture 2011  . Duodenal ulcer     remote per patient. +BC powders, patient report negative EGD six months ago  . Dilated pancreatic duct     ?chronic pancreatitis, EUS  pending (06/02/11)  . Kidney stones   . Chronic abdominal pain     Past Surgical History  Procedure Laterality Date  . Cholecystectomy    . Knee surgery    . Appendectomy    . Complete hysterectomy    . Ventral wall hernia    . Esophagogastroduodenoscopy  08/2010    Dr. Samuella Cota, Vers[place]. small hh, mild prepylori gastritis. path unavailable  . Esophagogastroduodenoscopy  04/2010    Dr. Samuella Cota, Versed. small hh, mild distal esophagitis, antral gastritis and duodenitis. Bx mild chronic gastritis and no H.pylori  . Esophagogastroduodenoscopy  08/2009    Dr. Allena Katz, [place]. Moderate gastritis, moderate duodenitis with nodularity in proximal duodenal bulb, bx chronic gastritis, no helicobacter, mild chronic duodenitis  . Esophagogastroduodenoscopy  02/2007    Dr. Samuella Cota, [place]. antral gastritis and duodenal ulcers. Bx mild chronic gastritis. No bx from duodenal ulcer available.  Gaspar Bidding dilation  04/06/2011    Procedure: SAVORY DILATION;  Surgeon: Arlyce Harman, MD;  Location: [facility];  Service: Endoscopy;  Laterality: N/A;  16 mm  . Esophagogastroduodenoscopy  04/06/11    Dr. Darrick Penna: 1-2 cm hiatal hernia, mod gastritis, 53mmX3mm linear ulcer in duodenal bulb, stricture 1st part of duodenum, dilated to 12 mm  . Esophagogastroduodenoscopy  05/12/11    partial gastric oulet obstruction secondary to stricture between bulb/2nd portion of duodenum with marked friability and inflammation but no discrete ulcer, dilated stomach. s/p dilation but high risk for restenosis  . Esophagogastroduodenoscopy  06/22/2011    RUE:[id number] ring/Moderate gastritis/PERSISTENT Ulcer in the bulb/descending duodenum Stricture in the bulb/descending duodenum  . Gastrojejunostomy  06/28/2011    Procedure: GASTROJEJUNOSTOMY;  Surgeon: Fabio Bering, MD;  Location: [facility];  Service: General;  Laterality: N/A;  Roux-en-y, Choledocalduodenostomy  . Abdominal hysterectomy    . Kidney stone surgery    . Esophagogastroduodenoscopy  Left 06/25/2012    RMR: Normal esophagus. Surgically altered anastomosis to  small intestine noted. Couple of  at the anastomosis.  Patent efferent limb Patient had an oozing 1 cm anastomotic ulcer  . Back surgery  2013    Melody Haver, RD, LDN Pager: [phone]

## 2012-08-12 NOTE — Progress Notes (Signed)
08/12/12 1735 Patient states had watery stool this evening after eating small amount of chicken broth for supper. Unable to send for hemoccult. Instructed to notify nursing staff of any further stools for hemoccult as ordered. Earnstine Regal, RN

## 2012-08-13 ENCOUNTER — Inpatient Hospital Stay (HOSPITAL_COMMUNITY)

## 2012-08-13 DIAGNOSIS — R197 Diarrhea, unspecified: Secondary | ICD-10-CM

## 2012-08-13 DIAGNOSIS — R1013 Epigastric pain: Secondary | ICD-10-CM

## 2012-08-13 DIAGNOSIS — R1031 Right lower quadrant pain: Secondary | ICD-10-CM

## 2012-08-13 DIAGNOSIS — R112 Nausea with vomiting, unspecified: Secondary | ICD-10-CM

## 2012-08-13 LAB — RETICULOCYTES
RBC.: 3.72 MIL/uL — ABNORMAL LOW (ref 3.87–5.11)
Retic Count, Absolute: 29.8 10*3/uL (ref 19.0–186.0)

## 2012-08-13 MED ORDER — HYDROMORPHONE HCL PF 1 MG/ML IJ SOLN
1.0000 mg | INTRAMUSCULAR | Status: DC | PRN
  Administered 2012-08-13 – 2012-08-18 (×32): 1 mg via INTRAVENOUS
  Filled 2012-08-13 (×34): qty 1

## 2012-08-13 NOTE — Progress Notes (Signed)
  Subjective:  patient is hungry and would like to eat.  Objective: Vital signs in last 24 hours: Temp:  [97.9 F (36.6 C)-98.1 F (36.7 C)] 97.9 F (36.6 C) (05/25 0520) Pulse Rate:  [67-74] 74 (05/25 0520) Resp:  [20] 20 (05/25 0520) BP: (128-149)/(79-95) 128/79 mmHg (05/25 0520) SpO2:  [96 %-99 %] 96 % (05/25 0520) Last BM Date: 08/12/12  Intake/Output from previous day: 05/24 0701 - 05/25 0700 In: 2649.7 [P.O.:980; I.V.:1669.7] Out: -  Intake/Output this shift:    General appearance: alert, cooperative, fatigued and no distress GI: Soft, flat. Mild point tenderness in the right lower quadrant, unchanged from yesterday. No rigidity noted. Lab Results:   Recent Labs  08/11/12 1230 08/12/12 0856  WBC 5.0 4.2  HGB 11.6* 9.3*  HCT 37.9 30.1*  PLT 337 228   BMET  Recent Labs  08/11/12 1230 08/12/12 0856  NA 138 139  K 2.4* 4.1  CL 94* 106  CO2 27 25  GLUCOSE 144* 145*  BUN 5* <3*  CREATININE 0.51 0.46*  CALCIUM 10.2 9.1   PT/INR No results found for this basename: LABPROT, INR,  in the last 72 hours  Studies/Results: Ct Abdomen Pelvis W Contrast  08/12/2012   *RADIOLOGY REPORT*  Clinical Data: Right-sided abdominal pain, nausea, diarrhea.  Prior partial gastrectomy, cholecystectomy, appendectomy.  Hysterectomy.  CT ABDOMEN AND PELVIS WITH CONTRAST  Technique:  Multidetector CT imaging of the abdomen and pelvis was performed following the standard protocol during bolus administration of intravenous contrast.  Contrast: 50mL OMNIPAQUE IOHEXOL 300 MG/ML  SOLN, OMNIPAQUE IOHEXOL 300 MG/ML  SOLN  Comparison: 06/28/2012  Findings: Diffuse hepatic hypodensity compatible with steatosis again noted.  No focal mass lesion.  Evidence of gastrojejunostomy without complicating feature. 1.3 cm left upper renal pole cortical cysts re-identified.  Right kidney, adrenal gland, spleen, and pancreas are unremarkable.  No free air or fluid.  No lymphadenopathy.  No bowel wall  thickening or focal segmental dilatation.  Injection granulomas are noted within the subcutaneous fat of the buttocks. Gas within the anterior abdominal wall subcutaneous soft tissues likely indicate injection site.  No acute osseous abnormality. Uterus and ovaries and appendix are surgically absent.  T12 compression deformity is stable.  IMPRESSION: No CT evidence for acute intra-abdominal or pelvic pathology. Postsurgical change as above.  Hepatic steatosis.   Original Report Authenticated By: Christiana Pellant, M.D.    Anti-infectives: Anti-infectives   None      Assessment/Plan: Impression: Abdominal pain of unknown etiology. Plan: Would go ahead and advance diet to soft. There is a possibility that she is intermittently obstructing, though this is always difficult to a certain as her previous x-ray tests have been for the most part unremarkable.   LOS: 2 days    Braddock Servellon A 08/13/2012

## 2012-08-13 NOTE — Progress Notes (Signed)
08/13/12 1834 Patient states pain decreased after receiving dilaudid as ordered PRN for pain. Instructed to notify nursing staff of any further episodes of severe pain as earlier this afternoon. No stools this afternoon per patient.  Earnstine Regal, RN

## 2012-08-13 NOTE — Progress Notes (Signed)
08/13/12 1449 Patient reports no stools since early this morning. Stated had two loose, watery stools early this morning but flushed them after seen by MD. Instructed to notify nursing staff if any further stools so hemoccult sample could be obtained as ordered per Dr Karilyn Cota. States will notify. Earnstine Regal, RN

## 2012-08-13 NOTE — Progress Notes (Signed)
08/13/12 1613 Called to patient's room, patient sitting up at side of bed doubled-over and tearful. Stated "i'm in severe pain". Stated "i got up to walk and get some exercise and now i'm hurting so bad". Not yet time for pain medication as ordered since last dilaudid given about 1400. Patient stated earlier oxycodone does not relieve pain at all. Notified Dr Karilyn Cota, orders placed for stat abdominal x-ray. Notified radiology of order, x-ray in process at this time. Nursing to monitor. Earnstine Regal, RN

## 2012-08-13 NOTE — Progress Notes (Addendum)
Subjective; Patient feels hungry. She continues to complain of abdominal pain which is centered in right lower quadrant closer to midline and umbilicus. She states she is never pain free although she gets significant relief with pain medication for 2 hours. She says she had 15 loose stools last evening. She thought she was passing bright red blood but the nursing staff looked at it and didn't see any blood. She received first dose of dicyclomine this morning. Objective; BP 128/79  Pulse 74  Temp(Src) 97.9 F (36.6 C) (Oral)  Resp 20  Ht 5\' 4"  (1.626 m)  Wt 98 lb 12.3 oz (44.8 kg)  BMI 16.94 kg/m2  SpO2 96% Abdomen symmetrical bowel sounds are hyperactive. Abdomen is soft with mild generalized tenderness but more so in the right lower quadrant without guarding or rebound. Lab data; Celiac antibody panel is pending. CT films reviewed with Dr. Bonnielee Haff. Mild dilation to some loops of small intestine but no transition noted. Assessment; Abdominal pain primarily centered in right lower quadrant associated with intermittent nausea vomiting and diarrhea. CT failed to show stricture or transition zone. Since her symptoms are intermittent and therefore difficult to make accurate diagnosis. Iron deficiency anemia. Patient received Feraheme infusion yesterday. She will undergo colonoscopy at a later date. Would also ask Dr. Ernestina Columbia examined her upper GI tract to make sure anastomotic ulcer has healed. Recommendations; Continued dicyclomine 10 mg by mouth a.c. CBC in a.m with reticulocyte count. Vitamin D level in a.m. If she has another episode of severe abdominal pain Will obtain acute abdominal series.

## 2012-08-13 NOTE — Progress Notes (Signed)
Jordan Haney UJW:[id number] DOB: 22-Apr-1955 DOA: 08/11/2012 PCP: Anson Oregon, DO   Subjective: Patient was hungry this morning but after she ate a bite of an egg and something else, she felt nauseous. She also had diarrhea again today.           Physical Exam: Blood pressure 128/79, pulse 74, temperature 97.9 F (36.6 C), temperature source Oral, resp. rate 20, height 5\' 4"  (1.626 m), weight 44.8 kg (98 lb 12.3 oz), SpO2 96.00%. She does look better than yesterday. She is still remains cachectic. Abdomen is soft and actually not as tender as yesterday. Bowel sounds are heard. She is alert and orientated.   Investigations:  Recent Results (from the past 240 hour(s))  CLOSTRIDIUM DIFFICILE BY PCR     Status: None   Collection Time    08/11/12 10:09 PM      Result Value Range Status   C difficile by pcr NEGATIVE  NEGATIVE Final     Basic Metabolic Panel:  Recent Labs  56/21/30 1230 08/11/12 1339 08/12/12 0856  NA 138  --  139  K 2.4*  --  4.1  CL 94*  --  106  CO2 27  --  25  GLUCOSE 144*  --  145*  BUN 5*  --  <3*  CREATININE 0.51  --  0.46*  CALCIUM 10.2  --  9.1  MG  --  1.9  --    Liver Function Tests:  Recent Labs  08/11/12 1230 08/12/12 0856  AST 56* 33  ALT 37* 27  ALKPHOS 189* 142*  BILITOT 0.5 0.4  PROT 8.7* 7.0  ALBUMIN 4.8 3.6     CBC:  Recent Labs  08/11/12 1230 08/12/12 0856  WBC 5.0 4.2  NEUTROABS 3.6  --   HGB 11.6* 9.3*  HCT 37.9 30.1*  MCV 71.8* 72.5*  PLT 337 228    Ct Abdomen Pelvis W Contrast  08/12/2012   *RADIOLOGY REPORT*  Clinical Data: Right-sided abdominal pain, nausea, diarrhea.  Prior partial gastrectomy, cholecystectomy, appendectomy.  Hysterectomy.  CT ABDOMEN AND PELVIS WITH CONTRAST  Technique:  Multidetector CT imaging of the abdomen and pelvis was performed following the standard protocol during bolus administration of intravenous contrast.  Contrast: 50mL OMNIPAQUE IOHEXOL 300 MG/ML  SOLN,  OMNIPAQUE IOHEXOL 300 MG/ML  SOLN  Comparison: 06/28/2012  Findings: Diffuse hepatic hypodensity compatible with steatosis again noted.  No focal mass lesion.  Evidence of gastrojejunostomy without complicating feature. 1.3 cm left upper renal pole cortical cysts re-identified.  Right kidney, adrenal gland, spleen, and pancreas are unremarkable.  No free air or fluid.  No lymphadenopathy.  No bowel wall thickening or focal segmental dilatation.  Injection granulomas are noted within the subcutaneous fat of the buttocks. Gas within the anterior abdominal wall subcutaneous soft tissues likely indicate injection site.  No acute osseous abnormality. Uterus and ovaries and appendix are surgically absent.  T12 compression deformity is stable.  IMPRESSION: No CT evidence for acute intra-abdominal or pelvic pathology. Postsurgical change as above.  Hepatic steatosis.   Original Report Authenticated By: Christiana Pellant, M.D.      Medications: I have reviewed the patient's current medications.  Impression: 1. Chronic recurrent abdominal pain, nausea, vomiting and diarrhea. Etiology is unclear but the suspicion is that she has intermittent partial bowel obstruction. This may be related to adhesions. 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 Leticia Penna and he and Dr. Karilyn Cota, gastroenterology will review scans  and make further plans. 2. Significant weight loss. 3. Abnormal liver enzymes have improved now. 4. 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     Plan: 1. Reduce IV fluids a little and encourage oral intake if possible. 2. Further recommendations from surgery and gastroenterology.  Consultants:  Gastroenterology, Dr Karilyn Cota.  Surgery, Dr. Lovell Sheehan and Dr. Leticia Penna.   Procedures:  None.   Antibiotics:  None.                   Code Status: Full code.  Family Communication: Discussed plan with patient at the bedside.   Disposition Plan: Home when  medically stable.  Time spent: 20 minutes.   LOS: 2 days   Wilson Singer Pager [phone]  08/13/2012, 10:35 AM

## 2012-08-14 LAB — CBC
Hemoglobin: 9.5 g/dL — ABNORMAL LOW (ref 12.0–15.0)
MCH: 22 pg — ABNORMAL LOW (ref 26.0–34.0)
MCHC: 29.1 g/dL — ABNORMAL LOW (ref 30.0–36.0)
Platelets: 238 10*3/uL (ref 150–400)
RDW: 15.5 % (ref 11.5–15.5)

## 2012-08-14 MED ORDER — DICYCLOMINE HCL 10 MG PO CAPS
20.0000 mg | ORAL_CAPSULE | Freq: Three times a day (TID) | ORAL | Status: DC
  Administered 2012-08-14 – 2012-08-20 (×17): 20 mg via ORAL
  Filled 2012-08-14 (×17): qty 2

## 2012-08-14 NOTE — Progress Notes (Signed)
Subjective; Patient continues to complain of abdominal pain. Pain is generalized but more pronounced at right lower quadrant. She has not experienced nausea or vomiting. She states her diarrhea has slowed down. She had 3 bowel movements yesterday. Stools are loose. No melena rectal bleeding reported. Objective; BP 122/86  Pulse 72  Temp(Src) 98.9 F (37.2 C) (Oral)  Resp 20  Ht 5\' 4"  (1.626 m)  Wt 98 lb 12.3 oz (44.8 kg)  BMI 16.94 kg/m2  SpO2 100%\ Abdominal exam reveals hyperactive bowel sounds. Abdomen is soft with mild generalized tenderness but more so in the right lower quadrant where she guards intermittently. Lab data; WBC 3.7, H&H 9.5 and 32.6 and platelet count 203K MCV 75.5. Celiac antibiotic panel is pending. Vitamin D level is pending. Assessment; Persistent abdominal pain. Dr. Lovell Sheehan has evaluated patient and he feels there is no indication for surgery. Nausea vomiting has resolved diarrhea has improved. Patient has unrelenting constant pain which seemed to get worse with meals. We have not been not able to note that she has stricture or internal herniation. I doubt that the edema in with abdominal angina as she has no risk factors. Iron deficiency anemia. She received iron infusion 2 days ago. She needs colonoscopy. Recommendations; Increase dicyclomine to 20 mg 3 times a day. Dr. Jena Gauss will be seeing patient in a.m.

## 2012-08-14 NOTE — Progress Notes (Signed)
Jordan Haney EAV:[id number] DOB: April 09, 1955 DOA: 08/11/2012 PCP: Anson Oregon, DO   Subjective: Patient was hungry this morning but after she ate a bite of an egg and a couple of bites of a biscuit and tolerated this.  She had opened her bowels today, still somewhat loose.           Physical Exam: Blood pressure 122/86, pulse 72, temperature 98.9 F (37.2 C), temperature source Oral, resp. rate 20, height 5\' 4"  (1.626 m), weight 44.8 kg (98 lb 12.3 oz), SpO2 100.00%. She does look better than yesterday. She is still remains cachectic. Abdomen is soft and tender in the right lower quadrant. Bowel sounds are heard. She is alert and orientated.   Investigations:  Recent Results (from the past 240 hour(s))  CLOSTRIDIUM DIFFICILE BY PCR     Status: None   Collection Time    08/11/12 10:09 PM      Result Value Range Status   C difficile by pcr NEGATIVE  NEGATIVE Final     Basic Metabolic Panel:  Recent Labs  78/29/56 1230 08/11/12 1339 08/12/12 0856  NA 138  --  139  K 2.4*  --  4.1  CL 94*  --  106  CO2 27  --  25  GLUCOSE 144*  --  145*  BUN 5*  --  <3*  CREATININE 0.51  --  0.46*  CALCIUM 10.2  --  9.1  MG  --  1.9  --    Liver Function Tests:  Recent Labs  08/11/12 1230 08/12/12 0856  AST 56* 33  ALT 37* 27  ALKPHOS 189* 142*  BILITOT 0.5 0.4  PROT 8.7* 7.0  ALBUMIN 4.8 3.6     CBC:  Recent Labs  08/11/12 1230 08/12/12 0856 08/14/12 0619  WBC 5.0 4.2 3.7*  NEUTROABS 3.6  --   --   HGB 11.6* 9.3* 9.5*  HCT 37.9 30.1* 32.6*  MCV 71.8* 72.5* 75.5*  PLT 337 228 238    Dg Abd 1 View  08/13/2012   *RADIOLOGY REPORT*  Clinical Data: Lower abdominal pain which has worsened today.  ABDOMEN - 1 VIEW  Comparison: CT abdomen and pelvis yesterday.  Upper GI series 07/18/2012.  Findings: Nonobstructive bowel gas pattern.  Oral contrast material from yesterday's CT is present throughout normal caliber colon.  No small bowel distention.  Surgical  clips and surgical anastomotic suture material in the right upper quadrant.  Phleboliths in the pelvis.  IMPRESSION: No acute abdominal abnormality.   Original Report Authenticated By: Hulan Saas, M.D.   Ct Abdomen Pelvis W Contrast  08/12/2012   *RADIOLOGY REPORT*  Clinical Data: Right-sided abdominal pain, nausea, diarrhea.  Prior partial gastrectomy, cholecystectomy, appendectomy.  Hysterectomy.  CT ABDOMEN AND PELVIS WITH CONTRAST  Technique:  Multidetector CT imaging of the abdomen and pelvis was performed following the standard protocol during bolus administration of intravenous contrast.  Contrast: 50mL OMNIPAQUE IOHEXOL 300 MG/ML  SOLN, OMNIPAQUE IOHEXOL 300 MG/ML  SOLN  Comparison: 06/28/2012  Findings: Diffuse hepatic hypodensity compatible with steatosis again noted.  No focal mass lesion.  Evidence of gastrojejunostomy without complicating feature. 1.3 cm left upper renal pole cortical cysts re-identified.  Right kidney, adrenal gland, spleen, and pancreas are unremarkable.  No free air or fluid.  No lymphadenopathy.  No bowel wall thickening or focal segmental dilatation.  Injection granulomas are noted within the subcutaneous fat of the buttocks. Gas within the anterior abdominal wall subcutaneous soft tissues likely indicate  injection site.  No acute osseous abnormality. Uterus and ovaries and appendix are surgically absent.  T12 compression deformity is stable.  IMPRESSION: No CT evidence for acute intra-abdominal or pelvic pathology. Postsurgical change as above.  Hepatic steatosis.   Original Report Authenticated By: Christiana Pellant, M.D.      Medications: I have reviewed the patient's current medications.  Impression: 1. Chronic recurrent abdominal pain, nausea, vomiting and diarrhea. Etiology is unclear but the suspicion is that she has intermittent partial bowel obstruction. This may be related to adhesions. The original surgeon who did her Roux-en-Y procedure in April 2003  is Dr. Leticia Penna and he and Dr. Karilyn Cota, gastroenterology will review scans and make further plans. 2. Significant weight loss. In June of 2013 she weighed 131 pounds. Approximately 6 weeks ago, she weighed 105 pounds. On this admission she now weighs 98 pounds. 3. Abnormal liver enzymes have improved now. 4. 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     Plan: 1. Encourage small regular meals to see if she will tolerate this. 2. Further recommendations from gastroenterology. She may well need a colonoscopy whilst she is an inpatient. Surgery is not indicated at the present time, according to Dr. Lovell Sheehan, surgery.  Consultants:  Gastroenterology, Dr Karilyn Cota.  Surgery, Dr. Lovell Sheehan and Dr. Leticia Penna.   Procedures:  None.   Antibiotics:  None.                   Code Status: Full code.  Family Communication: Discussed plan with patient at the bedside.   Disposition Plan: Home when medically stable.  Time spent: 20 minutes.   LOS: 3 days   Wilson Singer Pager [phone]  08/14/2012, 9:58 AM

## 2012-08-14 NOTE — Progress Notes (Signed)
Patient having more abdominal pain do to severe diarrhea. She is being seen by the GI service. At this point, more workup including a colonoscopy is needed prior to any surgical exploration. She has had significant weight loss over the past year which is unexplained. Will follow peripherally with you.

## 2012-08-15 ENCOUNTER — Ambulatory Visit: Admitting: Gastroenterology

## 2012-08-15 ENCOUNTER — Telehealth: Payer: Self-pay | Admitting: Gastroenterology

## 2012-08-15 DIAGNOSIS — D509 Iron deficiency anemia, unspecified: Secondary | ICD-10-CM

## 2012-08-15 DIAGNOSIS — R109 Unspecified abdominal pain: Secondary | ICD-10-CM

## 2012-08-15 LAB — TESTOSTERONE, % FREE: Testosterone-% Free: 1.4 % — ABNORMAL HIGH (ref 0.4–2.4)

## 2012-08-15 LAB — COMPREHENSIVE METABOLIC PANEL
ALT: 16 U/L (ref 0–35)
AST: 21 U/L (ref 0–37)
Albumin: 3.1 g/dL — ABNORMAL LOW (ref 3.5–5.2)
Alkaline Phosphatase: 127 U/L — ABNORMAL HIGH (ref 39–117)
Calcium: 9.3 mg/dL (ref 8.4–10.5)
Potassium: 4.3 mEq/L (ref 3.5–5.1)
Sodium: 141 mEq/L (ref 135–145)
Total Protein: 5.9 g/dL — ABNORMAL LOW (ref 6.0–8.3)

## 2012-08-15 LAB — TESTOSTERONE, FREE: Testosterone, Free: 2.6 pg/mL (ref 0.6–6.8)

## 2012-08-15 LAB — SEX HORMONE BINDING GLOBULIN: Sex Hormone Binding: 49 nmol/L (ref 18–114)

## 2012-08-15 MED ORDER — PEG 3350-KCL-NABCB-NACL-NASULF 236 G PO SOLR
4000.0000 mL | Freq: Once | ORAL | Status: AC
  Administered 2012-08-15: 4000 mL via ORAL
  Filled 2012-08-15: qty 4000

## 2012-08-15 MED ORDER — HEPARIN SODIUM (PORCINE) 5000 UNIT/ML IJ SOLN
5000.0000 [IU] | Freq: Three times a day (TID) | INTRAMUSCULAR | Status: DC
  Administered 2012-08-16 – 2012-08-21 (×14): 5000 [IU] via SUBCUTANEOUS
  Filled 2012-08-15 (×14): qty 1

## 2012-08-15 NOTE — Progress Notes (Signed)
UR chart review completed.  

## 2012-08-15 NOTE — Telephone Encounter (Signed)
Pt was a no show

## 2012-08-15 NOTE — Progress Notes (Signed)
Jordan Haney ION:[id number] DOB: [birth year] DOA: 08/11/2012 PCP: Anson Oregon, DO   Subjective: The patient seems to be improving somewhat today and is managed to eat a breakfast. Yesterday, she managed to walk in the hallway before she got some pain. She still continues to have pain but it appears to be less.           Physical Exam: Blood pressure 118/73, pulse 60, temperature 97.9 F (36.6 C), temperature source Oral, resp. rate 20, height 5\' 4"  (1.626 m), weight 44.8 kg (98 lb 12.3 oz), SpO2 98.00%. She does look better than yesterday. She is still remains cachectic. Abdomen is soft and tender in the right lower quadrant. Bowel sounds are heard. She is alert and orientated.   Investigations:  Recent Results (from the past 240 hour(s))  CLOSTRIDIUM DIFFICILE BY PCR     Status: None   Collection Time    08/11/12 10:09 PM      Result Value Range Status   C difficile by pcr NEGATIVE  NEGATIVE Final     Basic Metabolic Panel:  Recent Labs  24/40/10 0526  NA 141  K 4.3  CL 105  CO2 31  GLUCOSE 86  BUN 6  CREATININE 0.53  CALCIUM 9.3   Liver Function Tests:  Recent Labs  08/15/12 0526  AST 21  ALT 16  ALKPHOS 127*  BILITOT 0.1*  PROT 5.9*  ALBUMIN 3.1*     CBC:  Recent Labs  08/14/12 0619  WBC 3.7*  HGB 9.5*  HCT 32.6*  MCV 75.5*  PLT 238    Dg Abd 1 View  08/13/2012   *RADIOLOGY REPORT*  Clinical Data: Lower abdominal pain which has worsened today.  ABDOMEN - 1 VIEW  Comparison: CT abdomen and pelvis yesterday.  Upper GI series 07/18/2012.  Findings: Nonobstructive bowel gas pattern.  Oral contrast material from yesterday's CT is present throughout normal caliber colon.  No small bowel distention.  Surgical clips and surgical anastomotic suture material in the right upper quadrant.  Phleboliths in the pelvis.  IMPRESSION: No acute abdominal abnormality.   Original Report Authenticated By: Hulan Saas, M.D.      Medications: I  have reviewed the patient's current medications.  Impression: 1. Chronic recurrent abdominal pain, nausea, vomiting and diarrhea. Etiology is unclear but the suspicion is that she has intermittent partial bowel obstruction. This may be related to adhesions. 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 Leticia Penna. Dr. Lovell Sheehan, who is covering for Dr. Leticia Penna, does not feel at this point that surgery is warranted. 2. Significant weight loss. In June of 2013 she weighed 131 pounds. Approximately 6 weeks ago, she weighed 105 pounds. On this admission she now weighs 98 pounds. 3. Abnormal liver enzymes have improved now. 4. 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 5. Iron deficiency anemia.     Plan: 1. Encourage small regular meals . 2. Await further recommendations from gastroenterology, I think she would benefit from inpatient colonoscopy at this point..  Consultants:  Gastroenterology, Dr Rehman/Dr. Kendell Bane.  Surgery, Dr. Lovell Sheehan and Dr. Leticia Penna.   Procedures:  None.   Antibiotics:  None.                   Code Status: Full code.  Family Communication: Discussed plan with patient at the bedside.   Disposition Plan: Home when medically stable.  Time spent: 20 minutes.   LOS: 4 days   GOSRANI,NIMISH C Pager  [phone]  08/15/2012, 9:58 AM

## 2012-08-15 NOTE — Care Management Note (Signed)
    Page 1 of 1   08/21/2012     12:18:10 PM   CARE MANAGEMENT NOTE 08/21/2012  Patient:  Jordan Haney, Jordan Haney   Account Number:  [id number]  Date Initiated:  08/15/2012  Documentation initiated by:  Sharrie Rothman  Subjective/Objective Assessment:   Pt admitted from home with abd pain and hypokalemia. Pt lives with her fiance and will return home at discharge. Pt stated that when she isn't sick she is independent with ADL's.     Action/Plan:   No CM needs noted.   Anticipated DC Date:  08/17/2012   Anticipated DC Plan:  HOME/SELF CARE      DC Planning Services  CM consult      Choice offered to / List presented to:             Status of service:  Completed, signed off Medicare Important Message given?   (If response is "NO", the following Medicare IM given date fields will be blank) Date Medicare IM given:   Date Additional Medicare IM given:    Discharge Disposition:  HOME/SELF CARE  Per UR Regulation:    If discussed at Long Length of Stay Meetings, dates discussed:   08/17/2012    Comments:  08/21/12 1215 Arlyss Queen, RN BSN CM Pt discharged home today. No CM needs noted.  08/15/12 1122 Arlyss Queen, RN BSN CM

## 2012-08-15 NOTE — Progress Notes (Signed)
Subjective: Late entry: seen at 1600. Notes RLQ constant X 8 days. No diarrhea. Notes worsening with eating/drinking, feels like a "pushing and pulling" sensation. No diarrhea. +nausea.   Objective: Vital signs in last 24 hours: Temp:  [97.8 F (36.6 C)-97.9 F (36.6 C)] 97.9 F (36.6 C) (05/27 0424) Pulse Rate:  [59-60] 60 (05/27 0424) Resp:  [20] 20 (05/27 0424) BP: (106-118)/(70-73) 118/73 mmHg (05/27 0424) SpO2:  [98 %-100 %] 98 % (05/27 0424) Last BM Date: 08/13/12 General:   Alert and oriented, worried Head:  Normocephalic and atraumatic. Eyes:  No icterus, sclera clear. Conjuctiva pink.  Mouth:  Without lesions, mucosa pink and moist.  Heart:  S1, S2 present, no murmurs noted.  Lungs: Clear to auscultation bilaterally, without wheezing, rales, or rhonchi.  Abdomen:  Bowel sounds present, soft, TTP pinpoint RLQ, patient able to point exactly where discomfort is located, non-distended. No HSM or hernias noted.  Msk:  Symmetrical without gross deformities. Normal posture. Neurologic:  Alert and  oriented x4;  grossly normal neurologically. Skin:  Warm and dry, intact without significant lesions.    Intake/Output from previous day: 05/26 0701 - 05/27 0700 In: 630 [P.O.:630] Out: -  Intake/Output this shift:    Lab Results:  Recent Labs  08/12/12 0856 08/14/12 0619  WBC 4.2 3.7*  HGB 9.3* 9.5*  HCT 30.1* 32.6*  PLT 228 238   BMET  Recent Labs  08/12/12 0856 08/15/12 0526  NA 139 141  K 4.1 4.3  CL 106 105  CO2 25 31  GLUCOSE 145* 86  BUN <3* 6  CREATININE 0.46* 0.53  CALCIUM 9.1 9.3   LFT  Recent Labs  08/12/12 0856 08/15/12 0526  PROT 7.0 5.9*  ALBUMIN 3.6 3.1*  AST 33 21  ALT 27 16  ALKPHOS 142* 127*  BILITOT 0.4 0.1*    Studies/Results: Dg Abd 1 View  08/13/2012   *RADIOLOGY REPORT*  Clinical Data: Lower abdominal pain which has worsened today.  ABDOMEN - 1 VIEW  Comparison: CT abdomen and pelvis yesterday.  Upper GI series 07/18/2012.   Findings: Nonobstructive bowel gas pattern.  Oral contrast material from yesterday's CT is present throughout normal caliber colon.  No small bowel distention.  Surgical clips and surgical anastomotic suture material in the right upper quadrant.  Phleboliths in the pelvis.  IMPRESSION: No acute abdominal abnormality.   Original Report Authenticated By: Hulan Saas, M.D.    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 This admission with persistent RLQ abdominal pain, N/V, and diarrhea. CT last admission with evidence of colitis, and an outpatient colonoscopy had been recommended. Patient continues to have persistent RLQ pain, and she also has hx of IDA. Proceed with colonoscopy on 5/28 for further evaluation. This was discussed at length with the patient, including risks and benefits. She is agreeable to this.  Plan: Clear liquids NPO after midnight TCS with Dr. Jena Gauss on 5/28, Unable to give Phenergan on call due to allergy noted as anaphylaxis. She has done well in the past with Versed 8 mg, Demerol 150 mg IV and Zofran 4mg  IV.  2 tap water enemas in am Golytely now Hold 5/28 0600 and 1400 dose of heparin in anticipation of procedure  Nira Retort, ANP-BC [facility]    LOS: 4 days    08/15/2012, 1630   Attending note: Patient seen examined earlier this afternoon. Agree with  plans for colonoscopy. Will avoid Phenergan.

## 2012-08-16 ENCOUNTER — Encounter (HOSPITAL_COMMUNITY): Payer: Self-pay | Admitting: *Deleted

## 2012-08-16 ENCOUNTER — Encounter (HOSPITAL_COMMUNITY)
Admission: EM | Disposition: A | Discharge status: Home / Self Care | Payer: Self-pay | Source: Home / Self Care | Attending: Internal Medicine

## 2012-08-16 DIAGNOSIS — D509 Iron deficiency anemia, unspecified: Secondary | ICD-10-CM

## 2012-08-16 DIAGNOSIS — D649 Anemia, unspecified: Secondary | ICD-10-CM

## 2012-08-16 HISTORY — PX: COLONOSCOPY: SHX5424

## 2012-08-16 LAB — CBC
Hemoglobin: 8.3 g/dL — ABNORMAL LOW (ref 12.0–15.0)
MCHC: 30.6 g/dL (ref 30.0–36.0)
RBC: 3.59 MIL/uL — ABNORMAL LOW (ref 3.87–5.11)

## 2012-08-16 LAB — COMPREHENSIVE METABOLIC PANEL
ALT: 15 U/L (ref 0–35)
Alkaline Phosphatase: 127 U/L — ABNORMAL HIGH (ref 39–117)
CO2: 31 mEq/L (ref 19–32)
GFR calc Af Amer: >90 mL/min (ref >90)
Glucose, Bld: 84 mg/dL (ref 70–99)
Potassium: 3.9 mEq/L (ref 3.5–5.1)
Sodium: 142 mEq/L (ref 135–145)
Total Protein: 5.9 g/dL — ABNORMAL LOW (ref 6.0–8.3)

## 2012-08-16 LAB — OCCULT BLOOD X 1 CARD TO LAB, STOOL: Fecal Occult Bld: NEGATIVE

## 2012-08-16 SURGERY — COLONOSCOPY
Anesthesia: Moderate Sedation

## 2012-08-16 MED ORDER — ONDANSETRON HCL 4 MG/2ML IJ SOLN
INTRAMUSCULAR | Status: AC
  Filled 2012-08-16: qty 2

## 2012-08-16 MED ORDER — MIDAZOLAM HCL 5 MG/5ML IJ SOLN
INTRAMUSCULAR | Status: AC
  Filled 2012-08-16: qty 10

## 2012-08-16 MED ORDER — MIDAZOLAM HCL 5 MG/5ML IJ SOLN
INTRAMUSCULAR | Status: DC | PRN
  Administered 2012-08-16: 2 mg via INTRAVENOUS
  Administered 2012-08-16: 1 mg via INTRAVENOUS
  Administered 2012-08-16: 2 mg via INTRAVENOUS

## 2012-08-16 MED ORDER — MEPERIDINE HCL 100 MG/ML IJ SOLN
INTRAMUSCULAR | Status: DC | PRN
  Administered 2012-08-16: 25 mg via INTRAVENOUS
  Administered 2012-08-16: 50 mg via INTRAVENOUS

## 2012-08-16 MED ORDER — ONDANSETRON HCL 4 MG/2ML IJ SOLN
INTRAMUSCULAR | Status: DC | PRN
  Administered 2012-08-16: 4 mg via INTRAVENOUS

## 2012-08-16 MED ORDER — MEPERIDINE HCL 100 MG/ML IJ SOLN
INTRAMUSCULAR | Status: AC
  Filled 2012-08-16: qty 2

## 2012-08-16 MED ORDER — SODIUM CHLORIDE 0.9 % IV SOLN
INTRAVENOUS | Status: DC
  Administered 2012-08-16 – 2012-08-18 (×5): via INTRAVENOUS
  Administered 2012-08-18: 1000 mL via INTRAVENOUS
  Administered 2012-08-19 – 2012-08-20 (×3): via INTRAVENOUS

## 2012-08-16 MED ORDER — STERILE WATER FOR IRRIGATION IR SOLN
Status: DC | PRN
  Administered 2012-08-16: 16:00:00

## 2012-08-16 MED ORDER — SODIUM CHLORIDE 0.9 % IV SOLN: INTRAVENOUS | Status: DC

## 2012-08-16 NOTE — Progress Notes (Signed)
  NICOLLETTE WILHELMI ZOX:[id number] DOB: 10-Apr-1955 DOA: 08/11/2012 PCP: Anson Oregon, DO   Subjective: The patient seems to be improving slowly. She is due to have a, colonoscopy today with Dr. Kendell Bane.           Physical Exam: Blood pressure 120/78, pulse 64, temperature 98.2 F (36.8 C), temperature source Oral, resp. rate 20, height 5\' 4"  (1.626 m), weight 44.8 kg (98 lb 12.3 oz), SpO2 99.00%. She does look better than yesterday. She is still remains cachectic. Abdomen is soft and tender in the right lower quadrant. Bowel sounds are heard. She is alert and orientated.   Investigations:  Recent Results (from the past 240 hour(s))  CLOSTRIDIUM DIFFICILE BY PCR     Status: None   Collection Time    08/11/12 10:09 PM      Result Value Range Status   C difficile by pcr NEGATIVE  NEGATIVE Final     Basic Metabolic Panel:  Recent Labs  81/19/14 0526 08/16/12 0533  NA 141 142  K 4.3 3.9  CL 105 104  CO2 31 31  GLUCOSE 86 84  BUN 6 4*  CREATININE 0.53 0.51  CALCIUM 9.3 9.2   Liver Function Tests:  Recent Labs  08/15/12 0526 08/16/12 0533  AST 21 23  ALT 16 15  ALKPHOS 127* 127*  BILITOT 0.1* 0.1*  PROT 5.9* 5.9*  ALBUMIN 3.1* 3.1*     CBC:  Recent Labs  08/14/12 0619 08/16/12 0533  WBC 3.7* 4.0  HGB 9.5* 8.3*  HCT 32.6* 27.1*  MCV 75.5* 75.5*  PLT 238 167    No results found.    Medications: I have reviewed the patient's current medications.  Impression: 1. Chronic recurrent abdominal pain, nausea, vomiting and diarrhea. Etiology is unclear but the suspicion is that she has intermittent partial bowel obstruction. This may be related to adhesions. 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 Leticia Penna. Dr. Lovell Sheehan, who is covering for Dr. Leticia Penna, does not feel at this point that surgery is warranted. 2. Significant weight loss. In June of 2013 she weighed 131 pounds. Approximately 6 weeks ago, she weighed 105  pounds. On this admission she now weighs 98 pounds. 3. Abnormal liver enzymes have improved now. 4. 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 5. Iron deficiency anemia. Hemoglobin decreased today.     Plan: 1. Colonoscopy today. 2. Follow hemoglobin closely. May need intravenous iron during this hospitalization...  Consultants:  Gastroenterology, Dr Rehman/Dr. Kendell Bane.  Surgery, Dr. Lovell Sheehan and Dr. Leticia Penna.   Procedures:  None.   Antibiotics:  None.                   Code Status: Full code.  Family Communication: Discussed plan with patient at the bedside.   Disposition Plan: Home when medically stable.  Time spent: 20 minutes.   LOS: 5 days   Wilson Singer Pager [phone]  08/16/2012, 11:50 AM

## 2012-08-16 NOTE — Progress Notes (Signed)
08/16/12 0818 Patient states completed first tap water enema this morning, tolerated fairly well but c/o abdominal cramping afterward. States clear results after golytely prep and tap water enema, requested not to have second tap water enema due to discomfort. Discussed with Gerrit Halls, NP this morning, stated okay to hold second enema. Earnstine Regal, RN

## 2012-08-16 NOTE — Progress Notes (Signed)
08/16/12 1806 Patient's fiance, Larna Daughters requested to speak with Dr. Jena Gauss for update on how her colonoscopy went this afternoon. Had been waiting in day surgery waiting area to speak with him earlier this evening. Patient stated okay for Dr Jena Gauss to speak with him for update. Paged Dr Jena Gauss to notify, speaking with patient's fiance for update at this time. Earnstine Regal, RN

## 2012-08-16 NOTE — Op Note (Addendum)
[facility] [address] [place], [address]   COLONOSCOPY PROCEDURE REPORT  PATIENT: Jordan Haney, Jordan Haney  MR#:         [id number] BIRTHDATE: 1955-12-16 , 57  yrs. old GENDER: Female ENDOSCOPIST: R.  Roetta Sessions, MD FACP FACG REFERRED BY:  Lilly Cove, M.D. PROCEDURE DATE:  08/16/2012 PROCEDURE:     Diagnostic colonoscopy  INDICATIONS: iron deficiency anemia  INFORMED CONSENT:  The risks, benefits, alternatives and imponderables including but not limited to bleeding, perforation as well as the possibility of a missed lesion have been reviewed.  The potential for biopsy, lesion removal, etc. have also been discussed.  Questions have been answered.  All parties agreeable. Please see the history and physical in the medical record for more information.  MEDICATIONS: Versed 5 mg IV and Demerol 75 mg IV in divided doses. Zofran 4 mg IV  DESCRIPTION OF PROCEDURE:  After a digital rectal exam was performed, the EC-[id number] ([id number]) and EC-[id number] ([id number]) colonoscope was advanced from the anus through the rectum and colon to the area of the cecum, ileocecal valve and appendiceal orifice. The cecum was deeply intubated.  These structures were well-seen and photographed for the record.  From the level of the cecum and ileocecal valve, the scope was slowly and cautiously withdrawn. The mucosal surfaces were carefully surveyed utilizing scope tip deflection to facilitate fold flattening as needed.  The scope was pulled down into the rectum where a thorough examination including retroflexion was performed.    FINDINGS:  Inadequate preparation-particularly on the right side. Normal rectum. Normal. Colonic mucosa. A coating of tenacious viscous stool on the right side made exam more difficult. A small or flat lesions may have been obscured by the poor prep. Certainly, no gross carcinoma. I attempted to intubate the terminal ileum - was unable to do so because of  angulation.  THERAPEUTIC / DIAGNOSTIC MANEUVERS PERFORMED:  none  COMPLICATIONS: None  CECAL WITHDRAWAL TIME:  9 minutes  IMPRESSION:  Normal colonoscopy but inadequate preparation  RECOMMENDATIONS: Advance diet.  Suggest early interval followup screening colonoscopy. If right lower quadrant abdominal pain persists, would consider diagnostic laparoscopy.   _______________________________ eSigned:  R. Roetta Sessions, MD [facility] 08/16/2012 4:38 PM   CC:        I have discussed colonoscopy findings with the patient's fiance at length via telephone following the procedure yesterday evening.    Followup pending transglutaminase antibody level

## 2012-08-17 ENCOUNTER — Encounter (HOSPITAL_COMMUNITY): Payer: Self-pay | Admitting: Internal Medicine

## 2012-08-17 DIAGNOSIS — R634 Abnormal weight loss: Secondary | ICD-10-CM

## 2012-08-17 DIAGNOSIS — R1031 Right lower quadrant pain: Secondary | ICD-10-CM

## 2012-08-17 DIAGNOSIS — D509 Iron deficiency anemia, unspecified: Secondary | ICD-10-CM

## 2012-08-17 LAB — BASIC METABOLIC PANEL
BUN: 5 mg/dL — ABNORMAL LOW (ref 6–23)
Calcium: 8.8 mg/dL (ref 8.4–10.5)
Creatinine, Ser: 0.63 mg/dL (ref 0.50–1.10)
GFR calc Af Amer: >90 mL/min (ref >90)
GFR calc non Af Amer: >90 mL/min (ref >90)
Potassium: 3.8 mEq/L (ref 3.5–5.1)

## 2012-08-17 LAB — TISSUE TRANSGLUTAMINASE, IGA: Tissue Transglutaminase Ab, IgA: 4.3 U/mL (ref <20)

## 2012-08-17 LAB — VITAMIN D 1,25 DIHYDROXY
Vitamin D 1, 25 (OH)2 Total: 67 pg/mL (ref 18–72)
Vitamin D2 1, 25 (OH)2: 33 pg/mL
Vitamin D3 1, 25 (OH)2: 34 pg/mL

## 2012-08-17 LAB — CBC
HCT: 28.5 % — ABNORMAL LOW (ref 36.0–46.0)
MCHC: 30.2 g/dL (ref 30.0–36.0)
MCV: 76 fL — ABNORMAL LOW (ref 78.0–100.0)
RDW: 16.1 % — ABNORMAL HIGH (ref 11.5–15.5)

## 2012-08-17 MED ORDER — HYDROMORPHONE HCL PF 1 MG/ML IJ SOLN
1.0000 mg | INTRAMUSCULAR | Status: AC
  Administered 2012-08-17: 1 mg via INTRAVENOUS
  Filled 2012-08-17: qty 1

## 2012-08-17 MED ORDER — PANTOPRAZOLE SODIUM 40 MG PO TBEC
40.0000 mg | DELAYED_RELEASE_TABLET | Freq: Every day | ORAL | Status: DC
  Administered 2012-08-18 – 2012-08-21 (×4): 40 mg via ORAL
  Filled 2012-08-17 (×4): qty 1

## 2012-08-17 NOTE — Progress Notes (Signed)
08/17/12 1412 Called to patient's room, stated was having severe abdominal pain, tearful, and holding abdominal area. Stated "I started hurting when walking and the nurse got a wheelchair to take me to my room". Not yet time for dilaudid as ordered PRN for pain. Emotional support provided. Notified Dr Kerry Hough of patient's pain. Order received for dilaudid 1 mg IV x 1 dose NOW for pain. Order placed. Nursing to monitor. Earnstine Regal, RN

## 2012-08-17 NOTE — Progress Notes (Signed)
[facility] PROGRESS NOTE  JANAN BOGIE GEX:[id number] DOB: 07/25/1955 DOA: 08/11/2012 PCP: Anson Oregon, DO  Assessment/Plan: 1. Recurrent abdominal pain with nausea and vomiting. Etiology is not entirely clear. It is felt that this may be related to intermittent partial small bowel obstruction. May also be related to adhesions. Patient underwent colonoscopy yesterday was relatively unremarkable, although bowel prep was suboptimal. GI is following and recommends possible laparoscopy if her symptoms persist. She was seen by Dr. Lovell Sheehan on admission and at that time it was felt that she did not require surgical intervention. We will followup with general surgery if her pain persists. 2. Elevated liver enzymes, improved 3. Deficiency anemia. Hemoglobin appears to be stable. She did receive a dose of IV iron during this admission. 4. History of Roux-en-Y procedure in 06/2011 5. Significant weight loss.  Code Status: Full code Family Communication: Discussed with patient Disposition Plan: Discharge home once improve   Consultants:  Gastroenterology, Dr. Jena Gauss  General surgery, Dr. Lovell Sheehan  Procedures: Colonoscopy 5/28:Inadequate preparation-particularly on the right side.  Normal rectum. Normal. Colonic mucosa. A coating of tenacious  viscous stool on the right side made exam more difficult. A small  or flat lesions may have been obscured by the poor prep. Certainly,  no gross carcinoma. I attempted to intubate the terminal ileum -  was unable to do so because of angulation.    Antibiotics:  None  HPI/Subjective: Patient is continued right lower quadrant pain. She's had nausea but no significant vomiting. She is ambulating. She feels that her pain gets worse after she eat anything. She is having bowel movements and passing flatus.  Objective: Filed Vitals:   08/16/12 1649 08/16/12 2216 08/17/12 0557 08/17/12 1408  BP: 130/87 102/68 120/74 128/82  Pulse: 57 73 68  71  Temp: 98.5 F (36.9 C) 98 F (36.7 C) 98.4 F (36.9 C) 98.3 F (36.8 C)  TempSrc: Oral Oral Oral Oral  Resp: 18 18 18 18   Height:      Weight:      SpO2: 96% 94% 97% 99%    Intake/Output Summary (Last 24 hours) at 08/17/12 1500 Last data filed at 08/17/12 1335  Gross per 24 hour  Intake    318 ml  Output      0 ml  Net    318 ml   Filed Weights   08/11/12 1607 08/16/12 1526  Weight: 44.8 kg (98 lb 12.3 oz) 44.453 kg (98 lb)    Exam:   General:  No acute distress  Cardiovascular: S1, S2, regular rate and rhythm  Respiratory: Clear to auscultation bilaterally  Abdomen: Soft, tender in the right peri-umbilical region, bowel sounds are present  Musculoskeletal: No pedal edema bilaterally  Data Reviewed: Basic Metabolic Panel:  Recent Labs Lab 08/11/12 1230 08/11/12 1339 08/12/12 0856 08/15/12 0526 08/16/12 0533 08/17/12 0540  NA 138  --  139 141 142 140  K 2.4*  --  4.1 4.3 3.9 3.8  CL 94*  --  106 105 104 104  CO2 27  --  25 31 31 30   GLUCOSE 144*  --  145* 86 84 81  BUN 5*  --  <3* 6 4* 5*  CREATININE 0.51  --  0.46* 0.53 0.51 0.63  CALCIUM 10.2  --  9.1 9.3 9.2 8.8  MG  --  1.9  --   --   --   --    Liver Function Tests:  Recent Labs Lab 08/11/12 1230 08/12/12 0856  08/15/12 0526 08/16/12 0533  AST 56* 33 21 23  ALT 37* 27 16 15   ALKPHOS 189* 142* 127* 127*  BILITOT 0.5 0.4 0.1* 0.1*  PROT 8.7* 7.0 5.9* 5.9*  ALBUMIN 4.8 3.6 3.1* 3.1*    Recent Labs Lab 08/11/12 1317  LIPASE 31   No results found for this basename: AMMONIA,  in the last 168 hours CBC:  Recent Labs Lab 08/11/12 1230 08/12/12 0856 08/14/12 0619 08/16/12 0533 08/17/12 0540  WBC 5.0 4.2 3.7* 4.0 3.6*  NEUTROABS 3.6  --   --   --   --   HGB 11.6* 9.3* 9.5* 8.3* 8.6*  HCT 37.9 30.1* 32.6* 27.1* 28.5*  MCV 71.8* 72.5* 75.5* 75.5* 76.0*  PLT 337 228 238 167 156   Cardiac Enzymes: No results found for this basename: CKTOTAL, CKMB, CKMBINDEX, TROPONINI,  in the  last 168 hours BNP (last 3 results) No results found for this basename: PROBNP,  in the last 8760 hours CBG: No results found for this basename: GLUCAP,  in the last 168 hours  Recent Results (from the past 240 hour(s))  CLOSTRIDIUM DIFFICILE BY PCR     Status: None   Collection Time    08/11/12 10:09 PM      Result Value Range Status   C difficile by pcr NEGATIVE  NEGATIVE Final     Studies: No results found.  Scheduled Meds: . ALPRAZolam  0.5 mg Oral BID  . dicyclomine  20 mg Oral TID AC  . feeding supplement  1 Container Oral TID BM  . heparin subcutaneous  5,000 Units Subcutaneous Q8H  . lipase/protease/amylase  3 capsule Oral TID AC  . [START ON 08/18/2012] pantoprazole  40 mg Oral Q1200  . sodium chloride  1,000 mL Intravenous Once  . sodium chloride  3 mL Intravenous Q12H  . sucralfate  1 g Oral TID WC & HS   Continuous Infusions: . sodium chloride 75 mL/hr at 08/17/12 0252    Active Problems:   Nausea and vomiting   Hypokalemia   Weight loss, abnormal   Epigastric pain    Time spent:    MEMON,JEHANZEB  [facility] Pager [phone]. If 7PM-7AM, please contact night-coverage at [email or web address], password [facility] 08/17/2012, 3:00 PM  LOS: 6 days

## 2012-08-17 NOTE — Progress Notes (Addendum)
NUTRITION FOLLOW UP  Intervention:    Continue Resource Breeze po TID, each supplement provides 250 kcal and 9 grams of protein.  Nutrition Dx:   Inadequate oral intake; slowly improving  Goal:   Pt to meet >/= 90% of their estimated nutrition needs; progressing.  Monitor:   Po intake, labs and wt trends  Assessment:   Pt appetite a little better today. Nursing reports po intake 50% breakfast and 25% at lunch. She likes the [employer] which is helping to meet her nutrition goals.  Pt has good understanding of recommended foods and is compliant per her diet hx.    Height: Ht Readings from Last 1 Encounters:  08/16/12 5\' 4"  (1.626 m)    Weight Status:   Wt Readings from Last 1 Encounters:  08/16/12 98 lb (44.453 kg)    Re-estimated needs:  Kcal: 1300-1500  Protein: 53-66 gr Fluid: > 1300 ml/day  Skin: No issues noted  Diet Order: Cardiac   Intake/Output Summary (Last 24 hours) at 08/17/12 1642 Last data filed at 08/17/12 1614  Gross per 24 hour  Intake 1320.5 ml  Output      0 ml  Net 1320.5 ml    Last BM: 08/16/12   Labs:   Recent Labs Lab 08/11/12 1339  08/15/12 0526 08/16/12 0533 08/17/12 0540  NA  --   < > 141 142 140  K  --   < > 4.3 3.9 3.8  CL  --   < > 105 104 104  CO2  --   < > 31 31 30   BUN  --   < > 6 4* 5*  CREATININE  --   < > 0.53 0.51 0.63  CALCIUM  --   < > 9.3 9.2 8.8  MG 1.9  --   --   --   --   GLUCOSE  --   < > 86 84 81  < > = values in this interval not displayed.  CBG (last 3)  No results found for this basename: GLUCAP,  in the last 72 hours  Scheduled Meds: . ALPRAZolam  0.5 mg Oral BID  . dicyclomine  20 mg Oral TID AC  . feeding supplement  1 Container Oral TID BM  . heparin subcutaneous  5,000 Units Subcutaneous Q8H  . lipase/protease/amylase  3 capsule Oral TID AC  . [START ON 08/18/2012] pantoprazole  40 mg Oral Q1200  . sodium chloride  1,000 mL Intravenous Once  . sodium chloride  3 mL Intravenous Q12H   . sucralfate  1 g Oral TID WC & HS    Continuous Infusions: . sodium chloride 75 mL/hr at 08/17/12 1614    Royann Shivers MS,RD,LDN,CSG Office: #[phone] Pager: [phone]

## 2012-08-17 NOTE — Progress Notes (Signed)
08/17/12 1307 Patient requested to ambulate in hallway, to "work out the kink/pain" this afternoon. Ambulated to family waiting area and back to room, tolerating well with some discomfort during activity. Up to chair on return to room. Instructed to call for assistance as needed. Morrie Sheldon Amaro Mangold,RN

## 2012-08-17 NOTE — Progress Notes (Signed)
Present with patient for emotional and spiritual support. Patient was very expressive about current and past health concerns.  Discussed coping and support issues.  She shared aspects of her faith story.  Discussed hopes and fears.  Prayed with her.  Will follow up.

## 2012-08-17 NOTE — Progress Notes (Addendum)
Subjective:  RLQ pain started one week before ulcer found back in 06/2012. Initially she thought it must be related to the anastomotic ulcer (06/25/12) but the pain has progressively gotten worse over the last couple of weeks. She consumes about 1200 calories most days. Feels like she is eating pretty well, all healthy food but has to eat small quantities at a time. Never fully recovered from her surgery in 06/2011 (antrectomoy/duodenectomy, Roux-en-Y gastric jejunostomy, choledochal duodenostomy) last year. Pain did resolve initially but fatigue/stamina never did and she has continued to have gradual weight loss.  For one week, BM normal, pain starts then diarrhea. Then 4-5 days with pure diarrhea. No certain foods set it off. Bland food. No junk food.   03/2011: 143 lbs 05/2011: 142 lbs 06/2012: 102 lbs 08/16/12: 98 lbs   Objective: Vital signs in last 24 hours: Temp:  [98 F (36.7 C)-98.7 F (37.1 C)] 98.4 F (36.9 C) (05/29 0557) Pulse Rate:  [55-73] 68 (05/29 0557) Resp:  [14-20] 18 (05/29 0557) BP: (102-181)/(59-102) 120/74 mmHg (05/29 0557) SpO2:  [94 %-100 %] 97 % (05/29 0557) Weight:  [98 lb (44.453 kg)] 98 lb (44.453 kg) (05/28 1526) Last BM Date: 08/16/12 General:   Alert,  Well-developed, thin, pleasant and cooperative in NAD Head:  Normocephalic and atraumatic. Eyes:  Sclera clear, no icterus.   Abdomen:  Soft, RLQ tenderness and nondistended.  Normal bowel sounds, without guarding, and without rebound.   Extremities:  Without clubbing, deformity or edema. Neurologic:  Alert and  oriented x4;  grossly normal neurologically. Skin:  Intact without significant lesions or rashes. Psych:  Alert and cooperative. Normal mood and affect.  Intake/Output from previous day: 05/28 0701 - 05/29 0700 In: 287.5 [I.V.:287.5] Out: -  Intake/Output this shift:    Lab Results: CBC  Recent Labs  08/16/12 0533 08/17/12 0540  WBC 4.0 3.6*  HGB 8.3* 8.6*  HCT 27.1* 28.5*  MCV 75.5* 76.0*   PLT 167 156   BMET  Recent Labs  08/15/12 0526 08/16/12 0533 08/17/12 0540  NA 141 142 140  K 4.3 3.9 3.8  CL 105 104 104  CO2 31 31 30   GLUCOSE 86 84 81  BUN 6 4* 5*  CREATININE 0.53 0.51 0.63  CALCIUM 9.3 9.2 8.8   LFTs  Recent Labs  08/15/12 0526 08/16/12 0533  BILITOT 0.1* 0.1*  ALKPHOS 127* 127*  AST 21 23  ALT 16 15  PROT 5.9* 5.9*  ALBUMIN 3.1* 3.1*   No results found for this basename: LIPASE,  in the last 72 hours PT/INR No results found for this basename: LABPROT, INR,  in the last 72 hours    Imaging Studies: Dg Abd 1 View  08/13/2012   *RADIOLOGY REPORT*  Clinical Data: Lower abdominal pain which has worsened today.  ABDOMEN - 1 VIEW  Comparison: CT abdomen and pelvis yesterday.  Upper GI series 07/18/2012.  Findings: Nonobstructive bowel gas pattern.  Oral contrast material from yesterday's CT is present throughout normal caliber colon.  No small bowel distention.  Surgical clips and surgical anastomotic suture material in the right upper quadrant.  Phleboliths in the pelvis.  IMPRESSION: No acute abdominal abnormality.   Original Report Authenticated By: Hulan Saas, M.D.   Ct Abdomen Pelvis W Contrast  08/12/2012   *RADIOLOGY REPORT*  Clinical Data: Right-sided abdominal pain, nausea, diarrhea.  Prior partial gastrectomy, cholecystectomy, appendectomy.  Hysterectomy.  CT ABDOMEN AND PELVIS WITH CONTRAST  Technique:  Multidetector CT imaging of the abdomen  and pelvis was performed following the standard protocol during bolus administration of intravenous contrast.  Contrast: 50mL OMNIPAQUE IOHEXOL 300 MG/ML  SOLN, OMNIPAQUE IOHEXOL 300 MG/ML  SOLN  Comparison: 06/28/2012  Findings: Diffuse hepatic hypodensity compatible with steatosis again noted.  No focal mass lesion.  Evidence of gastrojejunostomy without complicating feature. 1.3 cm left upper renal pole cortical cysts re-identified.  Right kidney, adrenal gland, spleen, and pancreas are  unremarkable.  No free air or fluid.  No lymphadenopathy.  No bowel wall thickening or focal segmental dilatation.  Injection granulomas are noted within the subcutaneous fat of the buttocks. Gas within the anterior abdominal wall subcutaneous soft tissues likely indicate injection site.  No acute osseous abnormality. Uterus and ovaries and appendix are surgically absent.  T12 compression deformity is stable.  IMPRESSION: No CT evidence for acute intra-abdominal or pelvic pathology. Postsurgical change as above.  Hepatic steatosis.   Original Report Authenticated By: Christiana Pellant, M.D.   Dg Ugi W/small Bowel High Density  07/18/2012   *RADIOLOGY REPORT*  Clinical Data:Prior gastrojejunostomy.  Persistent abdominal pain. Vomiting.  Evaluate for small bowel adhesions.  UPPER GI W/ SMALL BOWEL HIGH DENSITY  Technique: Upper GI series performed with high density barium and effervescent agent. Thin barium also used. Subsequently, serial images of the small bowel were obtained including spot views of the terminal ileum.  Fluoroscopy Time: 2 minutes and 12 seconds  Comparison: Plain films of 07/10/2012 and CT of 06/28/2012.  Findings: Pre procedure scout film demonstrates a nonobstructive bowel gas pattern.  Surgical clips in the right upper quadrant and sutures in the left mid abdomen.  Double contrast upper GI images demonstrate surgical changes of gastrojejunostomy.  No filling defect or ulcer identified.  The proximal stomach is suboptimally evaluated, presumably well visualized on the patient's recent endoscopy.  Small follow-through images demonstrate mild proximal to mid small bowel dilatation, at up to 4.4 cm.  This is most apparent on the initial images, and favored to be be transient.  The more distal small bowel is normal in caliber.  No delay in contrast passage.  Normal small bowel fold pattern.  Normal small bowel transit time, approximately 2 hours and 30 minutes.  Normal terminal ileum.  IMPRESSION:   1.  Postsurgical changes of gastrojejunostomy, without acute finding in the stomach. 2.  Proximal small bowel dilatation, mild.  This is most apparent on initial images and is favored to be transient.  Although a component of low grade obstruction cannot be excluded (given normal caliber of mid to distal small bowel loops) there is no evidence of high-grade obstruction or delayed contrast passage.   Original Report Authenticated By: Jeronimo Greaves, M.D.  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 She presented last month with upper GI bleeding related to anastomotic ulcer. She reports absolutely no aspirin and NSAID use. This admission for persistent right lower quadrant abdominal pain of approximately 6 weeks' duration, diarrhea, nausea vomiting. In April she had a CT suggestive of left-sided colitis. C. difficile PCR was negative. She thought her pain may get better with treatment of her anastomotic ulcer the symptoms have been persistent. Location right lower quadrant pain entirely different than what she experienced all last year with persistent duodenal ulcer. She continues to have progressive weight loss. She is down 45  pounds since January 2013. TSH was normal. Celiac panel came back negative. She has persistent iron deficiency anemia with no significant improvement the last 6 weeks. Possible impaired iron absorption. Received IV Ferahem this admission per Dr. Karilyn Cota. Previous h.pylori negative, normal gastrin level.  Colonoscopy was normal but inadequate preparation. Unable to intubate the terminal ileum due to angulation. Early interval followup screening colonoscopy planned. Recommended to have diagnostic laparoscopy if right lower quadrant abdominal pain persists  and no explanation of pain.   Plan: 1. Early interval f/u colonoscopy as above. 2. Fasting AM cortisol level. 3. Add protonix.   LOS: 6 days   Tana Coast  08/17/2012, 7:56 AM  Attending note:  Patient seen late this afternoon. Agree with the impression recommendations as above. Patient may ultimately need tertiary evaluation

## 2012-08-17 NOTE — Progress Notes (Signed)
08/17/12 1221 Patient tolerating diet fairly well, states some occasional nausea.  C/o persistent abdominal pain/discomfort. States pain reduced with pain medication as ordered PRN. Pt has been up in room, and ambulated in hallway yesterday. C/o increased abdominal pain after ambulating yesterday. Offered to assist patient up to chair, stated will try after lunch. Instructed to call for assistance with getting up as needed and if any dizziness/weakness. States will call. Call light within reach. Earnstine Regal, RN

## 2012-08-18 ENCOUNTER — Inpatient Hospital Stay (HOSPITAL_COMMUNITY)

## 2012-08-18 DIAGNOSIS — K5900 Constipation, unspecified: Secondary | ICD-10-CM

## 2012-08-18 LAB — CBC
MCH: 23 pg — ABNORMAL LOW (ref 26.0–34.0)
MCHC: 30.2 g/dL (ref 30.0–36.0)
Platelets: 143 10*3/uL — ABNORMAL LOW (ref 150–400)
RDW: 16.7 % — ABNORMAL HIGH (ref 11.5–15.5)

## 2012-08-18 MED ORDER — IOHEXOL 300 MG/ML  SOLN
50.0000 mL | Freq: Once | INTRAMUSCULAR | Status: AC | PRN
  Administered 2012-08-18: 50 mL via ORAL

## 2012-08-18 MED ORDER — HYDROMORPHONE HCL PF 1 MG/ML IJ SOLN
1.0000 mg | Freq: Once | INTRAMUSCULAR | Status: AC
  Administered 2012-08-18: 1 mg via INTRAVENOUS

## 2012-08-18 MED ORDER — HYDROMORPHONE HCL PF 1 MG/ML IJ SOLN
1.0000 mg | INTRAMUSCULAR | Status: DC | PRN
  Administered 2012-08-18 – 2012-08-21 (×23): 1 mg via INTRAVENOUS
  Filled 2012-08-18 (×23): qty 1

## 2012-08-18 MED ORDER — IOHEXOL 300 MG/ML  SOLN
100.0000 mL | Freq: Once | INTRAMUSCULAR | Status: AC | PRN
  Administered 2012-08-18: 100 mL via INTRAVENOUS

## 2012-08-18 NOTE — Progress Notes (Signed)
Subjective:  Patient states her RLQ pain is much worse today. Unable to stand up or lay on her right side. Feels like something pulling/tugging in her belly. Feels knot. Worse with meals. No BM in two days since her bowel prep.   Objective: Vital signs in last 24 hours: Temp:  [98 F (36.7 C)-98.3 F (36.8 C)] 98 F (36.7 C) (05/30 0430) Pulse Rate:  [68-80] 68 (05/30 0430) Resp:  [18-20] 18 (05/30 0430) BP: (128-134)/(70-84) 134/84 mmHg (05/30 0430) SpO2:  [99 %-100 %] 99 % (05/30 0430) Last BM Date: 08/16/12 General:   Alert,  thin, pleasant and cooperative, appears uncomfortable. Head:  Normocephalic and atraumatic. Eyes:  Sclera clear, no icterus.   Abdomen:  Soft,  nondistended. Moderate tenderness in RLQ, point tenderness with golf ball size fullness, ?bowel vs other. Subjective guarding.  and without rebound.   Extremities:  Without clubbing, deformity or edema. Neurologic:  Alert and  oriented x4;  grossly normal neurologically. Skin:  Intact without significant lesions or rashes. Psych:  Alert and cooperative. Normal mood and affect.  Intake/Output from previous day: 05/29 0701 - 05/30 0700 In: 2218 [P.O.:480; I.V.:1738] Out: -  Intake/Output this shift:    Lab Results: CBC  Recent Labs  08/16/12 0533 08/17/12 0540 08/18/12 0532  WBC 4.0 3.6* 3.6*  HGB 8.3* 8.6* 8.6*  HCT 27.1* 28.5* 28.5*  MCV 75.5* 76.0* 76.2*  PLT 167 156 143*   BMET  Recent Labs  08/16/12 0533 08/17/12 0540  NA 142 140  K 3.9 3.8  CL 104 104  CO2 31 30  GLUCOSE 84 81  BUN 4* 5*  CREATININE 0.51 0.63  CALCIUM 9.2 8.8   LFTs  Recent Labs  08/16/12 0533  BILITOT 0.1*  ALKPHOS 127*  AST 23  ALT 15  PROT 5.9*  ALBUMIN 3.1*   No results found for this basename: LIPASE,  in the last 72 hours PT/INR No results found for this basename: LABPROT, INR,  in the last 72 hours    Imaging Studies: Dg Abd 1 View  08/13/2012   *RADIOLOGY REPORT*  Clinical Data: Lower abdominal  pain which has worsened today.  ABDOMEN - 1 VIEW  Comparison: CT abdomen and pelvis yesterday.  Upper GI series 07/18/2012.  Findings: Nonobstructive bowel gas pattern.  Oral contrast material from yesterday's CT is present throughout normal caliber colon.  No small bowel distention.  Surgical clips and surgical anastomotic suture material in the right upper quadrant.  Phleboliths in the pelvis.  IMPRESSION: No acute abdominal abnormality.   Original Report Authenticated By: Hulan Saas, M.D.   Ct Abdomen Pelvis W Contrast  08/12/2012   *RADIOLOGY REPORT*  Clinical Data: Right-sided abdominal pain, nausea, diarrhea.  Prior partial gastrectomy, cholecystectomy, appendectomy.  Hysterectomy.  CT ABDOMEN AND PELVIS WITH CONTRAST  Technique:  Multidetector CT imaging of the abdomen and pelvis was performed following the standard protocol during bolus administration of intravenous contrast.  Contrast: 50mL OMNIPAQUE IOHEXOL 300 MG/ML  SOLN, OMNIPAQUE IOHEXOL 300 MG/ML  SOLN  Comparison: 06/28/2012  Findings: Diffuse hepatic hypodensity compatible with steatosis again noted.  No focal mass lesion.  Evidence of gastrojejunostomy without complicating feature. 1.3 cm left upper renal pole cortical cysts re-identified.  Right kidney, adrenal gland, spleen, and pancreas are unremarkable.  No free air or fluid.  No lymphadenopathy.  No bowel wall thickening or focal segmental dilatation.  Injection granulomas are noted within the subcutaneous fat of the buttocks. Gas within the anterior abdominal wall  subcutaneous soft tissues likely indicate injection site.  No acute osseous abnormality. Uterus and ovaries and appendix are surgically absent.  T12 compression deformity is stable.  IMPRESSION: No CT evidence for acute intra-abdominal or pelvic pathology. Postsurgical change as above.  Hepatic steatosis.   Original Report Authenticated By: Christiana Pellant, M.D.  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 She presented last month with upper GI bleeding related to anastomotic ulcer. She reports absolutely no aspirin and NSAID use. This admission for persistent right lower quadrant abdominal pain of approximately 6 weeks' duration, diarrhea, nausea vomiting. In April she had a CT suggestive of left-sided colitis. C. difficile PCR was negative. She thought her pain may get better with treatment of her anastomotic ulcer the symptoms have been persistent. Location right lower quadrant pain entirely different than what she experienced all last year with persistent duodenal ulcer. She continues to have progressive weight loss. She is down 45 pounds since January 2013. TSH was normal. Celiac panel came back negative. She has persistent iron deficiency anemia with no significant improvement the last 6 weeks. Possible impaired iron absorption. Received IV Ferahem this admission per Dr. Karilyn Cota. Previous h.pylori negative, normal gastrin level. Fasting AM cortisol level is pending.   Colonoscopy was normal but inadequate preparation. Unable to intubate the terminal ileum due to angulation. Early interval followup screening colonoscopy planned. Recommended to have diagnostic laparoscopy if right lower quadrant abdominal pain persists and no explanation of pain.  Pain increased this morning. ?Spegelian hernia, bowel torsion or intussusception. Suspect whatever is causing her pain does not necessarily explain the extent of her weight loss and iron deficiency anemia.    Plan: 1. Followup fasting a.m. cortisol level when available. 2. Discussed with Dr. Jena Gauss. Recommend having surgery come back to evaluate her today for consideration of diagnostic laparoscopy. I have spoken with Dr. Kerry Hough  regarding current situation, he agrees and will call the surgery team and asked thm to come back and see her. 3. F/U EGD 3 months for h/o bleeding anastomotic ulcer.  4. Dr. Karilyn Cota will be providing GI coverage starting at [facility] today.   LOS: 7 days   Tana Coast  08/18/2012, 7:36 AM

## 2012-08-18 NOTE — Progress Notes (Signed)
Attending note: Agree with above. She does have a localized fullness/mass right lower quadrant. Dr. Leticia Penna has seen. Repeat CT to be done later today

## 2012-08-18 NOTE — Progress Notes (Signed)
2 Days Post-Op  Subjective: Patient seen earlier.  Pain has persisted in the RLQ without significant change.  She states she feels a bulge in the same area.  NO BM since c-scope.  Some associated nausea, no emesis.  Pain only somewhat improved with pain medications.    Objective: Vital signs in last 24 hours: Temp:  [97.9 F (36.6 C)-98.1 F (36.7 C)] 98.1 F (36.7 C) (05/30 2100) Pulse Rate:  [63-74] 63 (05/30 2100) Resp:  [18-20] 20 (05/30 2100) BP: (115-135)/(76-84) 135/79 mmHg (05/30 2100) SpO2:  [95 %-99 %] 99 % (05/30 2100) Last BM Date: 08/16/12  Intake/Output from previous day: 05/29 0701 - 05/30 0700 In: 2218 [P.O.:480; I.V.:1738] Out: -  Intake/Output this shift:    General appearance: alert and mild distress GI: Soft, +BS, moderate to severe RLQ tenderness.  Palpable fullness in this area.  NO rebound.  no diffuse peritoneal signs.    Lab Results:   Recent Labs  08/17/12 0540 08/18/12 0532  WBC 3.6* 3.6*  HGB 8.6* 8.6*  HCT 28.5* 28.5*  PLT 156 143*   BMET  Recent Labs  08/16/12 0533 08/17/12 0540  NA 142 140  K 3.9 3.8  CL 104 104  CO2 31 30  GLUCOSE 84 81  BUN 4* 5*  CREATININE 0.51 0.63  CALCIUM 9.2 8.8   PT/INR No results found for this basename: LABPROT, INR,  in the last 72 hours ABG No results found for this basename: PHART, PCO2, PO2, HCO3,  in the last 72 hours  Studies/Results: Ct Abdomen Pelvis W Contrast  08/18/2012   *RADIOLOGY REPORT*  Clinical Data: Increasing right lower quadrant abdominal pain. Prior cholecystectomy.  CT ABDOMEN AND PELVIS WITH CONTRAST  Technique:  Multidetector CT imaging of the abdomen and pelvis was performed following the standard protocol during bolus administration of intravenous contrast.  Contrast: 50mL OMNIPAQUE IOHEXOL 300 MG/ML  SOLN, OMNIPAQUE IOHEXOL 300 MG/ML  SOLN  Comparison: Plain films of 08/13/2012.  CT of 08/12/2012.  Findings: Lung bases:  Minimal scarring at the right lung base. Mild  cardiomegaly, without pericardial or pleural effusion.  Abdomen/pelvis:  Marked hepatic steatosis.  Mild hepatomegaly. Variant lateral segment left liver lobe extending in the left upper quadrant.  Normal spleen.  Surgical changes of gastrojejunostomy.  Pancreatic duct upper normal in size, without cause identified. Cholecystectomy without biliary ductal dilatation.  Normal adrenal glands and right kidney.  Upper pole left renal cyst.  No retroperitoneal or retrocrural adenopathy.  Colonic stool burden suggests constipation.  Normal terminal ileum.  Appendectomy. Normal small bowel without abdominal ascites.    No pelvic adenopathy.    Normal urinary bladder.  Hysterectomy. No adnexal mass.  No significant free fluid.  Bones/Musculoskeletal:  Moderate osteopenia.  Disc bulges at L3-L4 through L5-S1.  A moderate compression deformity involving the T12 vertebral body with approximately 8 millimeters of ventral canal encroachment.  IMPRESSION:  1. Probable constipation. 2.  No other explanation for pain. 3.  Chronic T12 compression deformity. 4.  Hepatomegaly and hepatic steatosis.   Original Report Authenticated By: Jeronimo Greaves, M.D.    Anti-infectives: Anti-infectives   None      Assessment/Plan: s/p Procedure(s) with comments: COLONOSCOPY (N/A) - Allergic to Phenergan. Last procedure performed with Versed, Demerol, and Zofran. Give Zofran 4 mg IV on call RLQ pain.  Currently, while patient does have pain, she does not demonstrate immediate signs for operative exploration.  I do feel as patient's symptoms have increased since admission and her  last CT evaluation, that we would get the most information from a repeat study at this time.  Pending findings and patient's course, I have discussed with her that she would not be a laparoscopic candidate should she require exploration, and would be a difficult open procedure due to extensive prior surgeries.    LOS: 7 days    Jordan Haney C 08/18/2012

## 2012-08-18 NOTE — Progress Notes (Signed)
[facility] PROGRESS NOTE  Jordan Haney JXB:[id number] DOB: 07/24/1955 DOA: 08/11/2012 PCP: Anson [place], DO  Assessment/Plan: 1. Recurrent abdominal pain with nausea and vomiting. Etiology is not entirely clear. It is felt that this may be related to intermittent partial small bowel obstruction. May also be related to adhesions. Patient underwent colonoscopy which was relatively unremarkable, although bowel prep was suboptimal. GI is following and recommends repeat surgical evaluation since her pain is persisting. Patient is requiring frequent doses of IV pain medicine. I have discussed the case with Dr. Leticia Penna who is familiar with the patient. He has ordered a repeat CT scan of the abdomen and pelvis for today. This has been read as probable constipation. She does have point tenderness in cough pulse has fullness in her abdominal exam. It is unclear if constipation with the sole cause of this. Would defer need for further diagnostic laparotomy to general surgery. 2. Elevated liver enzymes, improved 3. Deficiency anemia. Hemoglobin appears to be stable. She did receive a dose of IV iron during this admission. 4. History of Roux-en-Y procedure in 06/2011 5. Significant weight loss.  Code Status: Full code Family Communication: Discussed with patient Disposition Plan: Discharge home once improve   Consultants:  Gastroenterology, Dr. Jena Gauss  General surgery, Dr. Lovell Sheehan, Dr. Leticia Penna  Procedures: Colonoscopy 5/28:Inadequate preparation-particularly on the right side.  Normal rectum. Normal. Colonic mucosa. A coating of tenacious  viscous stool on the right side made exam more difficult. A small  or flat lesions may have been obscured by the poor prep. Certainly,  no gross carcinoma. I attempted to intubate the terminal ileum -  was unable to do so because of angulation.    Antibiotics:  None  HPI/Subjective: Complains of continued right lower coronary pain. This is  worse after eating or drinking. She is requiring frequent doses of IV pain medicine.  Objective: Filed Vitals:   08/17/12 1408 08/17/12 2117 08/18/12 0430 08/18/12 1300  BP: 128/82 128/70 134/84 115/76  Pulse: 71 80 68 74  Temp: 98.3 F (36.8 C) 98 F (36.7 C) 98 F (36.7 C) 97.9 F (36.6 C)  TempSrc: Oral Oral Oral Oral  Resp: 18 20 18 18   Height:      Weight:      SpO2: 99% 100% 99% 95%    Intake/Output Summary (Last 24 hours) at 08/18/12 1519 Last data filed at 08/18/12 0200  Gross per 24 hour  Intake 1627.5 ml  Output      0 ml  Net 1627.5 ml   Filed Weights   08/11/12 1607 08/16/12 1526  Weight: 44.8 kg (98 lb 12.3 oz) 44.453 kg (98 lb)    Exam:   General:  No acute distress  Cardiovascular: S1, S2, regular rate and rhythm  Respiratory: Clear to auscultation bilaterally  Abdomen: Soft, tender in the right peri-umbilical region, bowel sounds are present  Musculoskeletal: No pedal edema bilaterally  Data Reviewed: Basic Metabolic Panel:  Recent Labs Lab 08/12/12 0856 08/15/12 0526 08/16/12 0533 08/17/12 0540  NA 139 141 142 140  K 4.1 4.3 3.9 3.8  CL 106 105 104 104  CO2 25 31 31 30   GLUCOSE 145* 86 84 81  BUN <3* 6 4* 5*  CREATININE 0.46* 0.53 0.51 0.63  CALCIUM 9.1 9.3 9.2 8.8   Liver Function Tests:  Recent Labs Lab 08/12/12 0856 08/15/12 0526 08/16/12 0533  AST 33 21 23  ALT 27 16 15   ALKPHOS 142* 127* 127*  BILITOT 0.4 0.1* 0.1*  PROT 7.0 5.9* 5.9*  ALBUMIN 3.6 3.1* 3.1*   No results found for this basename: LIPASE, AMYLASE,  in the last 168 hours No results found for this basename: AMMONIA,  in the last 168 hours CBC:  Recent Labs Lab 08/12/12 0856 08/14/12 0619 08/16/12 0533 08/17/12 0540 08/18/12 0532  WBC 4.2 3.7* 4.0 3.6* 3.6*  HGB 9.3* 9.5* 8.3* 8.6* 8.6*  HCT 30.1* 32.6* 27.1* 28.5* 28.5*  MCV 72.5* 75.5* 75.5* 76.0* 76.2*  PLT 228 238 167 156 143*   Cardiac Enzymes: No results found for this basename:  CKTOTAL, CKMB, CKMBINDEX, TROPONINI,  in the last 168 hours BNP (last 3 results) No results found for this basename: PROBNP,  in the last 8760 hours CBG: No results found for this basename: GLUCAP,  in the last 168 hours  Recent Results (from the past 240 hour(s))  CLOSTRIDIUM DIFFICILE BY PCR     Status: None   Collection Time    08/11/12 10:09 PM      Result Value Range Status   C difficile by pcr NEGATIVE  NEGATIVE Final     Studies: Ct Abdomen Pelvis W Contrast  08/18/2012   *RADIOLOGY REPORT*  Clinical Data: Increasing right lower quadrant abdominal pain. Prior cholecystectomy.  CT ABDOMEN AND PELVIS WITH CONTRAST  Technique:  Multidetector CT imaging of the abdomen and pelvis was performed following the standard protocol during bolus administration of intravenous contrast.  Contrast: 50mL OMNIPAQUE IOHEXOL 300 MG/ML  SOLN, OMNIPAQUE IOHEXOL 300 MG/ML  SOLN  Comparison: Plain films of 08/13/2012.  CT of 08/12/2012.  Findings: Lung bases:  Minimal scarring at the right lung base. Mild cardiomegaly, without pericardial or pleural effusion.  Abdomen/pelvis:  Marked hepatic steatosis.  Mild hepatomegaly. Variant lateral segment left liver lobe extending in the left upper quadrant.  Normal spleen.  Surgical changes of gastrojejunostomy.  Pancreatic duct upper normal in size, without cause identified. Cholecystectomy without biliary ductal dilatation.  Normal adrenal glands and right kidney.  Upper pole left renal cyst.  No retroperitoneal or retrocrural adenopathy.  Colonic stool burden suggests constipation.  Normal terminal ileum.  Appendectomy. Normal small bowel without abdominal ascites.    No pelvic adenopathy.    Normal urinary bladder.  Hysterectomy. No adnexal mass.  No significant free fluid.  Bones/Musculoskeletal:  Moderate osteopenia.  Disc bulges at L3-L4 through L5-S1.  A moderate compression deformity involving the T12 vertebral body with approximately 8 millimeters of ventral  canal encroachment.  IMPRESSION:  1. Probable constipation. 2.  No other explanation for pain. 3.  Chronic T12 compression deformity. 4.  Hepatomegaly and hepatic steatosis.   Original Report Authenticated By: Jeronimo Greaves, M.D.    Scheduled Meds: . ALPRAZolam  0.5 mg Oral BID  . dicyclomine  20 mg Oral TID AC  . feeding supplement  1 Container Oral TID BM  . heparin subcutaneous  5,000 Units Subcutaneous Q8H  . lipase/protease/amylase  3 capsule Oral TID AC  . pantoprazole  40 mg Oral Q1200  . sodium chloride  1,000 mL Intravenous Once  . sodium chloride  3 mL Intravenous Q12H  . sucralfate  1 g Oral TID WC & HS   Continuous Infusions: . sodium chloride 1,000 mL (08/18/12 0441)    Active Problems:   Nausea and vomiting   Hypokalemia   Weight loss, abnormal   Epigastric pain    Time spent:    Kaniya Trueheart  [facility] Pager [phone]. If 7PM-7AM, please contact night-coverage at [email or web address], password  [email or web address] 08/18/2012, 3:19 PM  LOS: 7 days

## 2012-08-19 MED ORDER — POLYETHYLENE GLYCOL 3350 17 G PO PACK
17.0000 g | PACK | Freq: Every day | ORAL | Status: DC
  Administered 2012-08-19 – 2012-08-21 (×3): 17 g via ORAL
  Filled 2012-08-19 (×3): qty 1

## 2012-08-19 NOTE — Progress Notes (Signed)
Subjective; Patient states she feels miserable. She does not feel any better. She is requiring pain medication every 2 hours. She has no appetite. After initial reluctance she agreed to have soapsuds enemas since CT yesterday suggested constipation. Objective; BP 135/79  Pulse 63  Temp(Src) 98.1 F (36.7 C) (Oral)  Resp 20  Ht 5\' 4"  (1.626 m)  Wt 98 lb (44.453 kg)  BMI 16.81 kg/m2  SpO2 99% Abdominal exam reveals normal bowel sounds. Soft abdomen with mild to moderate tenderness in right lower quadrant. She has soft submucosal lesion in the right lower quadrant which is suspicious for lipoma it is about 3 x 4 cm in size. Assessment; Right lower quadrant abdominal pain with negative workup. She has small subcutaneous lesion in right lower quadrant suspicious for lipoma felt to be incidental. Patient has been evaluated by Dr. Leticia Penna. Patient may eventually need laparotomy as she is not a candidate for diagnostic laparoscopy. Iron deficiency anemia. Colonoscopy negative. Serology for celiac disease was also negative. She will need another dose of 2 of parenteral iron.

## 2012-08-19 NOTE — Progress Notes (Signed)
3 Days Post-Op  Subjective: Pain about the same. No noted bowel movement. No nausea. Tolerating diet.  Objective: Vital signs in last 24 hours: Temp:  [98 F (36.7 C)-98.1 F (36.7 C)] 98 F (36.7 C) (05/31 1401) Pulse Rate:  [63-73] 73 (05/31 1401) Resp:  [20] 20 (05/31 1401) BP: (105-135)/(63-79) 105/63 mmHg (05/31 1401) SpO2:  [99 %-100 %] 100 % (05/31 1401) Last BM Date: 08/16/12  Intake/Output from previous day: 05/30 0701 - 05/31 0700 In: 2118.8 [I.V.:2118.8] Out: -  Intake/Output this shift:    General appearance: alert and no distress GI: Soft, positive bowel sounds, moderate to severe right lower quadrant no tenderness. No peritoneal signs.  Lab Results:   Recent Labs  08/17/12 0540 08/18/12 0532  WBC 3.6* 3.6*  HGB 8.6* 8.6*  HCT 28.5* 28.5*  PLT 156 143*   BMET  Recent Labs  08/17/12 0540  NA 140  K 3.8  CL 104  CO2 30  GLUCOSE 81  BUN 5*  CREATININE 0.63  CALCIUM 8.8   PT/INR No results found for this basename: LABPROT, INR,  in the last 72 hours ABG No results found for this basename: PHART, PCO2, PO2, HCO3,  in the last 72 hours  Studies/Results: Ct Abdomen Pelvis W Contrast  08/18/2012   *RADIOLOGY REPORT*  Clinical Data: Increasing right lower quadrant abdominal pain. Prior cholecystectomy.  CT ABDOMEN AND PELVIS WITH CONTRAST  Technique:  Multidetector CT imaging of the abdomen and pelvis was performed following the standard protocol during bolus administration of intravenous contrast.  Contrast: 50mL OMNIPAQUE IOHEXOL 300 MG/ML  SOLN, OMNIPAQUE IOHEXOL 300 MG/ML  SOLN  Comparison: Plain films of 08/13/2012.  CT of 08/12/2012.  Findings: Lung bases:  Minimal scarring at the right lung base. Mild cardiomegaly, without pericardial or pleural effusion.  Abdomen/pelvis:  Marked hepatic steatosis.  Mild hepatomegaly. Variant lateral segment left liver lobe extending in the left upper quadrant.  Normal spleen.  Surgical changes of  gastrojejunostomy.  Pancreatic duct upper normal in size, without cause identified. Cholecystectomy without biliary ductal dilatation.  Normal adrenal glands and right kidney.  Upper pole left renal cyst.  No retroperitoneal or retrocrural adenopathy.  Colonic stool burden suggests constipation.  Normal terminal ileum.  Appendectomy. Normal small bowel without abdominal ascites.    No pelvic adenopathy.    Normal urinary bladder.  Hysterectomy. No adnexal mass.  No significant free fluid.  Bones/Musculoskeletal:  Moderate osteopenia.  Disc bulges at L3-L4 through L5-S1.  A moderate compression deformity involving the T12 vertebral body with approximately 8 millimeters of ventral canal encroachment.  IMPRESSION:  1. Probable constipation. 2.  No other explanation for pain. 3.  Chronic T12 compression deformity. 4.  Hepatomegaly and hepatic steatosis.   Original Report Authenticated By: Jeronimo Greaves, M.D.    Anti-infectives: Anti-infectives   None      Assessment/Plan: s/p Procedure(s) with comments: COLONOSCOPY (N/A) - Allergic to Phenergan. Last procedure performed with Versed, Demerol, and Zofran. Give Zofran 4 mg IV on call Right lower quadrant abdominal pain, constipation/obstipation. Continue washout from both above and below. Clinically there is no indication for surgical intervention at this time. He'll continue to follow her course but I do not feel that her pain is related to a surgical etiology. I have discussed with the patient and do feel this is likely related to the stool seen within the cecum on her CT evaluation. Will see if she has improvement with continued washout.  LOS: 8 days  Ulises Wolfinger C 08/19/2012

## 2012-08-19 NOTE — Progress Notes (Signed)
[facility] PROGRESS NOTE  Jordan Haney ZOX:[id number] DOB: [birth year] DOA: 08/11/2012 PCP: Anson [place], DO  Assessment/Plan: 1. Recurrent abdominal pain with nausea and vomiting. Etiology is not entirely clear. It is felt that this may be related to intermittent partial small bowel obstruction. May also be related to adhesions. Patient underwent colonoscopy which was relatively unremarkable, although bowel prep was suboptimal. GI is following and recommends repeat surgical evaluation since her pain is persisting. Dr. Leticia Penna is also following. Repeat CT scan of the abdomen and pelvis done yesterday indicate constipation. It is felt that her symptoms may be related to this. She has been started on MiraLAX as well as soapsuds enemas. I offered her a molasses enema, but she declined and said that she has not been able to tolerate them in the past. We'll continue with MiraLAX and soapsuds enema and see if patient has any improvement in her symptoms. She is still requiring frequent pain medicine. 2. Elevated liver enzymes, improved 3. Deficiency anemia. Hemoglobin appears to be stable. She did receive a dose of IV iron during this admission. 4. History of Roux-en-Y procedure in 06/2011 5. Significant weight loss.  Code Status: Full code Family Communication: Discussed with patient Disposition Plan: Discharge home once improve   Consultants:  Gastroenterology, Dr. Jena Gauss  General surgery, Dr. Lovell Sheehan, Dr. Leticia Penna  Procedures: Colonoscopy 5/28:Inadequate preparation-particularly on the right side.  Normal rectum. Normal. Colonic mucosa. A coating of tenacious  viscous stool on the right side made exam more difficult. A small  or flat lesions may have been obscured by the poor prep. Certainly,  no gross carcinoma. I attempted to intubate the terminal ileum -  was unable to do so because of angulation.    Antibiotics:  None  HPI/Subjective: Continued abdominal pain. She  reports pain is worse approximately 20-30 minutes after eating. She's not reported any significant bowel movement after receiving enemas. No nausea vomiting  Objective: Filed Vitals:   08/18/12 0430 08/18/12 1300 08/18/12 2100 08/19/12 1401  BP: 134/84 115/76 135/79 105/63  Pulse: 68 74 63 73  Temp: 98 F (36.7 C) 97.9 F (36.6 C) 98.1 F (36.7 C) 98 F (36.7 C)  TempSrc: Oral Oral Oral Oral  Resp: 18 18 20 20   Height:      Weight:      SpO2: 99% 95% 99% 100%    Intake/Output Summary (Last 24 hours) at 08/19/12 1654 Last data filed at 08/19/12 0615  Gross per 24 hour  Intake 2118.75 ml  Output      0 ml  Net 2118.75 ml   Filed Weights   08/11/12 1607 08/16/12 1526  Weight: 44.8 kg (98 lb 12.3 oz) 44.453 kg (98 lb)    Exam:   General:  No acute distress  Cardiovascular: S1, S2, regular rate and rhythm  Respiratory: Clear to auscultation bilaterally  Abdomen: Soft, tender in the right peri-umbilical region, bowel sounds are present  Musculoskeletal: No pedal edema bilaterally  Data Reviewed: Basic Metabolic Panel:  Recent Labs Lab 08/15/12 0526 08/16/12 0533 08/17/12 0540  NA 141 142 140  K 4.3 3.9 3.8  CL 105 104 104  CO2 31 31 30   GLUCOSE 86 84 81  BUN 6 4* 5*  CREATININE 0.53 0.51 0.63  CALCIUM 9.3 9.2 8.8   Liver Function Tests:  Recent Labs Lab 08/15/12 0526 08/16/12 0533  AST 21 23  ALT 16 15  ALKPHOS 127* 127*  BILITOT 0.1* 0.1*  PROT 5.9* 5.9*  ALBUMIN  3.1* 3.1*   No results found for this basename: LIPASE, AMYLASE,  in the last 168 hours No results found for this basename: AMMONIA,  in the last 168 hours CBC:  Recent Labs Lab 08/14/12 0619 08/16/12 0533 08/17/12 0540 08/18/12 0532  WBC 3.7* 4.0 3.6* 3.6*  HGB 9.5* 8.3* 8.6* 8.6*  HCT 32.6* 27.1* 28.5* 28.5*  MCV 75.5* 75.5* 76.0* 76.2*  PLT 238 167 156 143*   Cardiac Enzymes: No results found for this basename: CKTOTAL, CKMB, CKMBINDEX, TROPONINI,  in the last 168  hours BNP (last 3 results) No results found for this basename: PROBNP,  in the last 8760 hours CBG: No results found for this basename: GLUCAP,  in the last 168 hours  Recent Results (from the past 240 hour(s))  CLOSTRIDIUM DIFFICILE BY PCR     Status: None   Collection Time    08/11/12 10:09 PM      Result Value Range Status   C difficile by pcr NEGATIVE  NEGATIVE Final     Studies: Ct Abdomen Pelvis W Contrast  08/18/2012   *RADIOLOGY REPORT*  Clinical Data: Increasing right lower quadrant abdominal pain. Prior cholecystectomy.  CT ABDOMEN AND PELVIS WITH CONTRAST  Technique:  Multidetector CT imaging of the abdomen and pelvis was performed following the standard protocol during bolus administration of intravenous contrast.  Contrast: 50mL OMNIPAQUE IOHEXOL 300 MG/ML  SOLN, OMNIPAQUE IOHEXOL 300 MG/ML  SOLN  Comparison: Plain films of 08/13/2012.  CT of 08/12/2012.  Findings: Lung bases:  Minimal scarring at the right lung base. Mild cardiomegaly, without pericardial or pleural effusion.  Abdomen/pelvis:  Marked hepatic steatosis.  Mild hepatomegaly. Variant lateral segment left liver lobe extending in the left upper quadrant.  Normal spleen.  Surgical changes of gastrojejunostomy.  Pancreatic duct upper normal in size, without cause identified. Cholecystectomy without biliary ductal dilatation.  Normal adrenal glands and right kidney.  Upper pole left renal cyst.  No retroperitoneal or retrocrural adenopathy.  Colonic stool burden suggests constipation.  Normal terminal ileum.  Appendectomy. Normal small bowel without abdominal ascites.    No pelvic adenopathy.    Normal urinary bladder.  Hysterectomy. No adnexal mass.  No significant free fluid.  Bones/Musculoskeletal:  Moderate osteopenia.  Disc bulges at L3-L4 through L5-S1.  A moderate compression deformity involving the T12 vertebral body with approximately 8 millimeters of ventral canal encroachment.  IMPRESSION:  1. Probable  constipation. 2.  No other explanation for pain. 3.  Chronic T12 compression deformity. 4.  Hepatomegaly and hepatic steatosis.   Original Report Authenticated By: Jeronimo Greaves, M.D.    Scheduled Meds: . ALPRAZolam  0.5 mg Oral BID  . dicyclomine  20 mg Oral TID AC  . feeding supplement  1 Container Oral TID BM  . heparin subcutaneous  5,000 Units Subcutaneous Q8H  . lipase/protease/amylase  3 capsule Oral TID AC  . pantoprazole  40 mg Oral Q1200  . polyethylene glycol  17 g Oral Daily  . sodium chloride  1,000 mL Intravenous Once  . sodium chloride  3 mL Intravenous Q12H  . sucralfate  1 g Oral TID WC & HS   Continuous Infusions: . sodium chloride 75 mL/hr at 08/19/12 0981    Active Problems:   Nausea and vomiting   Hypokalemia   Weight loss, abnormal   Epigastric pain    Time spent:    Karsten Howry  [facility] Pager [phone]. If 7PM-7AM, please contact night-coverage at [email or web address], password [facility] 08/19/2012, 4:54 PM  LOS: 8 days

## 2012-08-20 DIAGNOSIS — R109 Unspecified abdominal pain: Secondary | ICD-10-CM

## 2012-08-20 DIAGNOSIS — R112 Nausea with vomiting, unspecified: Secondary | ICD-10-CM

## 2012-08-20 MED ORDER — OXYCODONE-ACETAMINOPHEN 5-325 MG PO TABS
2.0000 | ORAL_TABLET | Freq: Four times a day (QID) | ORAL | Status: DC
  Administered 2012-08-20 – 2012-08-21 (×3): 2 via ORAL
  Filled 2012-08-20 (×4): qty 2

## 2012-08-20 MED ORDER — MILK AND MOLASSES ENEMA
Freq: Once | RECTAL | Status: AC
  Administered 2012-08-20: 21:00:00 via RECTAL

## 2012-08-20 NOTE — Progress Notes (Signed)
4 Days Post-Op  Subjective: Pain persists. Still described as colicky in nature. No significant bowel function.  Objective: Vital signs in last 24 hours: Temp:  [97.7 F (36.5 C)-98.4 F (36.9 C)] 97.7 F (36.5 C) (06/01 0515) Pulse Rate:  [61-69] 61 (06/01 0515) Resp:  [20] 20 (06/01 0515) BP: (123-154)/(69-88) 141/87 mmHg (06/01 0518) SpO2:  [97 %-100 %] 100 % (06/01 0515) Weight:  [49.896 kg (110 lb)] 49.896 kg (110 lb) (06/01 0515) Last BM Date: 07/17/12  Intake/Output from previous day: 05/31 0701 - 06/01 0700 In: 1781.3 [I.V.:1781.3] Out: -  Intake/Output this shift:    General appearance: alert and Anxious, uncomfortable GI: Intermittent bowel sounds, soft, flat, moderate to severe right lower quadrant abdominal wall tenderness. No diffuse peritoneal signs. Pain is slightly distractible .  Lab Results:   Recent Labs  08/18/12 0532  WBC 3.6*  HGB 8.6*  HCT 28.5*  PLT 143*   BMET No results found for this basename: NA, K, CL, CO2, GLUCOSE, BUN, CREATININE, CALCIUM,  in the last 72 hours PT/INR No results found for this basename: LABPROT, INR,  in the last 72 hours ABG No results found for this basename: PHART, PCO2, PO2, HCO3,  in the last 72 hours  Studies/Results: No results found.  Anti-infectives: Anti-infectives   None      Assessment/Plan: s/p Procedure(s) with comments: COLONOSCOPY (N/A) - Allergic to Phenergan. Last procedure performed with Versed, Demerol, and Zofran. Give Zofran 4 mg IV on call Right lower quadrant abdominal pain. Chronic pain history, constipation obstipation. At this time I do not identify any acute surgical findings. Clinically she still has significant pain however I cannot discern a surgical etiology for her symptoms. I am still highly suspicious given the colicky nature and the physical and radiographic findings at this could be related to retained stool within the cecum. While she is on a regimen of paralyzed and has  undergone several enemas I do feel she may benefit from a milk and molasses enema. Short of this I did not have any significant contributions to her current course. I do feel she may benefit from referral to a pain clinic to assist with some of her symptomatology which obviously can be done as an outpatient.  LOS: 9 days    Titilayo Hagans C 08/20/2012

## 2012-08-20 NOTE — Progress Notes (Signed)
[facility] PROGRESS NOTE  Jordan Haney ZOX:[id number] DOB: 05-27-1955 DOA: 08/11/2012 PCP: Jordan Oregon, DO  Assessment/Plan: 1. Recurrent abdominal pain with nausea and vomiting. Etiology is not entirely clear. It is felt that this may be related to intermittent partial small bowel obstruction. May also be related to adhesions. Patient underwent colonoscopy which was relatively unremarkable, although bowel prep was suboptimal. GI is following and recommends repeat surgical evaluation since her pain is persisting. Dr. Leticia Haney is also following. Repeat CT scan of the abdomen and pelvis indicated constipation. It is felt that her symptoms may be related to this. She has been started on MiraLAX as well as soapsuds enemas. She initially declined milk of molasses enema, but now is agreeable. Case was discussed with Dr. Leticia Haney and it was not felt that she has any acute surgical indication. She's been started on oral pain medication in the hopes of weaning down IV pain medicine. Hopefully we'll be able to discharge her home soon.  2. Elevated liver enzymes, improved 3. Iron Deficiency anemia. Hemoglobin appears to be stable. She did receive a dose of IV iron during this admission. 4. History of Roux-en-Y procedure in 06/2011 5. Significant weight loss.  Code Status: Full code Family Communication: Discussed with patient Disposition Plan: Discharge home once improve   Consultants:  Gastroenterology, Dr. Jena Haney  General surgery, Dr. Lovell Haney, Dr. Leticia Haney  Procedures: Colonoscopy 5/28:Inadequate preparation-particularly on the right side.  Normal rectum. Normal. Colonic mucosa. A coating of tenacious  viscous stool on the right side made exam more difficult. A small  or flat lesions may have been obscured by the poor prep. Certainly,  no gross carcinoma. I attempted to intubate the terminal ileum -  was unable to do so because of  angulation.    Antibiotics:  None  HPI/Subjective: Patient says she is feeling a little stronger today. She was ambulating in the halls. She reports continued nausea. Abdominal pain is manageable with IV pain medicine. She's not had any significant bowel movements.  Objective: Filed Vitals:   08/19/12 2100 08/20/12 0515 08/20/12 0518 08/20/12 1457  BP: 123/69 154/88 141/87 137/68  Pulse: 69 61  72  Temp: 98.4 F (36.9 C) 97.7 F (36.5 C)  97.9 F (36.6 C)  TempSrc: Oral Oral  Oral  Resp: 20 20  20   Height:      Weight:  49.896 kg (110 lb)    SpO2: 97% 100%  100%    Intake/Output Summary (Last 24 hours) at 08/20/12 1947 Last data filed at 08/20/12 1700  Gross per 24 hour  Intake 3081.25 ml  Output      0 ml  Net 3081.25 ml   Filed Weights   08/11/12 1607 08/16/12 1526 08/20/12 0515  Weight: 44.8 kg (98 lb 12.3 oz) 44.453 kg (98 lb) 49.896 kg (110 lb)    Exam:   General:  No acute distress  Cardiovascular: S1, S2, regular rate and rhythm  Respiratory: Clear to auscultation bilaterally  Abdomen: Soft, tender in the right peri-umbilical region, bowel sounds are present  Musculoskeletal: No pedal edema bilaterally  Data Reviewed: Basic Metabolic Panel:  Recent Labs Lab 08/15/12 0526 08/16/12 0533 08/17/12 0540  NA 141 142 140  K 4.3 3.9 3.8  CL 105 104 104  CO2 31 31 30   GLUCOSE 86 84 81  BUN 6 4* 5*  CREATININE 0.53 0.51 0.63  CALCIUM 9.3 9.2 8.8   Liver Function Tests:  Recent Labs Lab 08/15/12 0526 08/16/12 0533  AST 21 23  ALT 16 15  ALKPHOS 127* 127*  BILITOT 0.1* 0.1*  PROT 5.9* 5.9*  ALBUMIN 3.1* 3.1*   No results found for this basename: LIPASE, AMYLASE,  in the last 168 hours No results found for this basename: AMMONIA,  in the last 168 hours CBC:  Recent Labs Lab 08/14/12 0619 08/16/12 0533 08/17/12 0540 08/18/12 0532  WBC 3.7* 4.0 3.6* 3.6*  HGB 9.5* 8.3* 8.6* 8.6*  HCT 32.6* 27.1* 28.5* 28.5*  MCV 75.5* 75.5* 76.0*  76.2*  PLT 238 167 156 143*   Cardiac Enzymes: No results found for this basename: CKTOTAL, CKMB, CKMBINDEX, TROPONINI,  in the last 168 hours BNP (last 3 results) No results found for this basename: PROBNP,  in the last 8760 hours CBG: No results found for this basename: GLUCAP,  in the last 168 hours  Recent Results (from the past 240 hour(s))  CLOSTRIDIUM DIFFICILE BY PCR     Status: None   Collection Time    08/11/12 10:09 PM      Result Value Range Status   C difficile by pcr NEGATIVE  NEGATIVE Final     Studies: No results found.  Scheduled Meds: . ALPRAZolam  0.5 mg Oral BID  . feeding supplement  1 Container Oral TID BM  . heparin subcutaneous  5,000 Units Subcutaneous Q8H  . lipase/protease/amylase  3 capsule Oral TID AC  . milk and molasses   Rectal Once  . oxyCODONE-acetaminophen  2 tablet Oral Q6H  . pantoprazole  40 mg Oral Q1200  . polyethylene glycol  17 g Oral Daily  . sodium chloride  1,000 mL Intravenous Once  . sodium chloride  3 mL Intravenous Q12H  . sucralfate  1 g Oral TID WC & HS   Continuous Infusions: . sodium chloride 75 mL/hr at 08/20/12 1158    Active Problems:   Nausea and vomiting   Hypokalemia   Weight loss, abnormal   Epigastric pain    Time spent:    Jordan Haney  [facility] Pager [phone]. If 7PM-7AM, please contact night-coverage at [email or web address], password [email or web address] 08/20/2012, 7:47 PM  LOS: 9 days

## 2012-08-20 NOTE — Progress Notes (Signed)
Subjective; Patient continues to complain of pain in right lower quadrant. She did not have any results with soapsuds enemas yesterday. She remains with poor appetite. She states pain is severe and she cannot think straight. Objective; BP 141/87  Pulse 61  Temp(Src) 97.7 F (36.5 C) (Oral)  Resp 20  Ht 5\' 4"  (1.626 m)  Wt 110 lb (49.896 kg)  BMI 18.87 kg/m2  SpO2 100% Examination reveals normal bowel sounds. Abdomen mild to moderate tenderness right lower quadrant in the region where she has small tenuous lesion possibly lipoma. Assessment; She remains with unrelenting constant abdominal pain up has been negative. She has been reevaluated by Dr. Leticia Penna who does not feel there is any indication for diagnostic laparotomy. He feels she needs to be referred to pain clinic. Iron deficiency anemia. Single infusion of ferahema over a week ago. Recommendations; Stop dicyclomine. CBC in a.m. Consider switching to oral analgesia.

## 2012-08-21 DIAGNOSIS — D509 Iron deficiency anemia, unspecified: Secondary | ICD-10-CM

## 2012-08-21 DIAGNOSIS — R109 Unspecified abdominal pain: Secondary | ICD-10-CM

## 2012-08-21 DIAGNOSIS — K5900 Constipation, unspecified: Secondary | ICD-10-CM

## 2012-08-21 LAB — BASIC METABOLIC PANEL
Chloride: 104 mEq/L (ref 96–112)
GFR calc Af Amer: >90 mL/min (ref >90)
GFR calc non Af Amer: >90 mL/min (ref >90)
Potassium: 3.6 mEq/L (ref 3.5–5.1)

## 2012-08-21 LAB — CBC
Platelets: 134 10*3/uL — ABNORMAL LOW (ref 150–400)
RDW: 18.8 % — ABNORMAL HIGH (ref 11.5–15.5)
WBC: 5.4 10*3/uL (ref 4.0–10.5)

## 2012-08-21 MED ORDER — OXYCODONE HCL ER 10 MG PO T12A: 10.0000 mg | EXTENDED_RELEASE_TABLET | Freq: Two times a day (BID) | ORAL | Status: DC

## 2012-08-21 MED ORDER — OXYCODONE HCL 5 MG PO TABS: 10.0000 mg | ORAL_TABLET | Freq: Four times a day (QID) | ORAL | Status: DC | PRN

## 2012-08-21 NOTE — Progress Notes (Signed)
Discharge instructions given on medications,and follow up visits,patient verbalized understanding. Prescriptions sent with patient.Vital signs stable. Accompanied by staff to an awaiting vehicle. 

## 2012-08-21 NOTE — Discharge Summary (Signed)
Physician Discharge Summary  Jordan Haney RUE:[id number] DOB: Jul 30, 1955 DOA: 08/11/2012  PCP: Anson Oregon, DO  Admit date: 08/11/2012 Discharge date: 08/21/2012  Time spent: Greater than 30 minutes  Recommendations for Outpatient Follow-up:  1. Followup primary care physician in the next couple weeks.   Discharge Diagnoses:  1. Right lower quadrant abdominal pain, unclear etiology. Colonoscopy unremarkable. CT scan of the abdomen unremarkable. 2. Weight loss of approximately 35 pounds since June of 2013. 3. Situational depression.   Discharge Condition: Stable.  Diet recommendation: Small frequent meals.  Filed Weights   08/11/12 1607 08/16/12 1526 08/20/12 0515  Weight: 44.8 kg (98 lb 12.3 oz) 44.453 kg (98 lb) 49.896 kg (110 lb)    History of present illness:  This 57 year old lady presents to the hospital with symptoms of abdominal pain, nausea and vomiting and diarrhea. Please see initial history as outlined below: HPI: Jordan Haney is a 57 y.o. female who presents once again with symptoms of abdominal pain, nausea vomiting and diarrhea. She has become dehydrated and hypokalemic again. She has had a complicated past medical history with a Roux-en-Y procedure by Dr. Leticia Penna previously. She has recently been seen by Dr. Rourke/Dr. fields who have been managing her symptoms as an outpatient. However, the last 3-4 days, her symptoms became much worse and she was unable to stay at home.  She continues to lose weight and is concerned about this. She does not have any rectal bleeding. There is no hematemesis.  Hospital Course:  Patient has had a prolonged course in the hospital. She continued to have abdominal pain, nausea and vomiting and required intravenous opioids and intravenous fluids. She had extensive workup including CT scan of the abdomen twice and colonoscopy. Colonoscopy was unremarkable. She was seen by Dr. Leticia Penna, surgery, who performed her original Roux-en-Y  procedure-he felt that surgical intervention was not indicated at this time. She was also seen by gastroenterology, Dr. Karilyn Cota and also Dr. Kendell Bane. Dr Karilyn Cota felt that she may have intermittent partial bowel obstruction accounting for her symptoms. He case more recently she has become more constipated and medical molasses enema did finally get results. She still continues to have right lower quadrant abdominal pain which is excruciating. Examination of this area shows a tender probable lipoma. This may actually be the cause of her pain. She is now stable for discharge and can be managed as an outpatient with long-acting opioids and breakthrough short-acting opioids. She wishes to seek a second opinion from a Careers adviser.  Procedures:  Colonoscopy on 08/16/2012 by Dr. Kendell Bane. The findings were unremarkable.   Consultations:  Gastroenterology, Dr. Karilyn Cota and Dr. Kendell Bane.  Surgery, Dr. Leticia Penna.  Discharge Exam: Filed Vitals:   08/20/12 0518 08/20/12 1457 08/20/12 2100 08/21/12 0500  BP: 141/87 137/68 159/97 122/85  Pulse:  72 62 67  Temp:  97.9 F (36.6 C) 98.1 F (36.7 C) 98.1 F (36.7 C)  TempSrc:  Oral Oral Oral  Resp:  20 17 16   Height:      Weight:      SpO2:  100% 99% 96%    General: She looks systemically well, anxious. Cardiovascular: Heart sounds are present and normal without murmurs or added sounds. Respiratory: Lung fields are clear. Abdomen is soft and superficially tender in the right lower quadrant, exactly where a mass is superficially, I suspect this is a lipoma.  Discharge Instructions  Discharge Orders   Future Orders Complete By Expires     Diet - low sodium heart  healthy  As directed     Increase activity slowly  As directed         Medication List    TAKE these medications       acetaminophen 325 MG tablet  Commonly known as:  TYLENOL  Take 2 tablets (650 mg total) by mouth every 6 (six) hours as needed.     ALPRAZolam 0.5 MG tablet  Commonly known as:   XANAX  Take 0.5 mg by mouth 2 (two) times daily.     lipase/protease/amylase 16109 UNITS Cpep  Commonly known as:  CREON-12/PANCREASE  Take 3 capsules by mouth 3 (three) times daily before meals.     ondansetron 4 MG tablet  Commonly known as:  ZOFRAN  Take 1 tablet (4 mg total) by mouth every 6 (six) hours as needed for nausea.     oxyCODONE 5 MG immediate release tablet  Commonly known as:  ROXICODONE  Take 2 tablets (10 mg total) by mouth every 6 (six) hours as needed for pain.     OxyCODONE 10 mg T12a  Commonly known as:  OXYCONTIN  Take 1 tablet (10 mg total) by mouth every 12 (twelve) hours.     sucralfate 1 GM/10ML suspension  Commonly known as:  CARAFATE  Take 10 mLs (1 g total) by mouth 4 (four) times daily -  with meals and at bedtime.     zolpidem 5 MG tablet  Commonly known as:  AMBIEN  Take 5 mg by mouth at bedtime as needed for sleep.       Allergies  Allergen Reactions  . Compazine Anaphylaxis  . Promethazine Hcl Anaphylaxis  . Vistaril (Hydroxyzine Hcl) Other (See Comments)    Reaction: legs shake  . Benadryl (Diphenhydramine Hcl) Other (See Comments)    Pt states, "it makes my legs shake really bad"   . Gabapentin Other (See Comments)    Reactions: legs shake  . Latex Swelling      The results of significant diagnostics from this hospitalization (including imaging, microbiology, ancillary and laboratory) are listed below for reference.    Significant Diagnostic Studies: Dg Abd 1 View  08/13/2012   *RADIOLOGY REPORT*  Clinical Data: Lower abdominal pain which has worsened today.  ABDOMEN - 1 VIEW  Comparison: CT abdomen and pelvis yesterday.  Upper GI series 07/18/2012.  Findings: Nonobstructive bowel gas pattern.  Oral contrast material from yesterday's CT is present throughout normal caliber colon.  No small bowel distention.  Surgical clips and surgical anastomotic suture material in the right upper quadrant.  Phleboliths in the pelvis.  IMPRESSION:  No acute abdominal abnormality.   Original Report Authenticated By: Hulan Saas, M.D.   Ct Abdomen Pelvis W Contrast  08/18/2012   *RADIOLOGY REPORT*  Clinical Data: Increasing right lower quadrant abdominal pain. Prior cholecystectomy.  CT ABDOMEN AND PELVIS WITH CONTRAST  Technique:  Multidetector CT imaging of the abdomen and pelvis was performed following the standard protocol during bolus administration of intravenous contrast.  Contrast: 50mL OMNIPAQUE IOHEXOL 300 MG/ML  SOLN, OMNIPAQUE IOHEXOL 300 MG/ML  SOLN  Comparison: Plain films of 08/13/2012.  CT of 08/12/2012.  Findings: Lung bases:  Minimal scarring at the right lung base. Mild cardiomegaly, without pericardial or pleural effusion.  Abdomen/pelvis:  Marked hepatic steatosis.  Mild hepatomegaly. Variant lateral segment left liver lobe extending in the left upper quadrant.  Normal spleen.  Surgical changes of gastrojejunostomy.  Pancreatic duct upper normal in size, without cause identified. Cholecystectomy without biliary ductal  dilatation.  Normal adrenal glands and right kidney.  Upper pole left renal cyst.  No retroperitoneal or retrocrural adenopathy.  Colonic stool burden suggests constipation.  Normal terminal ileum.  Appendectomy. Normal small bowel without abdominal ascites.    No pelvic adenopathy.    Normal urinary bladder.  Hysterectomy. No adnexal mass.  No significant free fluid.  Bones/Musculoskeletal:  Moderate osteopenia.  Disc bulges at L3-L4 through L5-S1.  A moderate compression deformity involving the T12 vertebral body with approximately 8 millimeters of ventral canal encroachment.  IMPRESSION:  1. Probable constipation. 2.  No other explanation for pain. 3.  Chronic T12 compression deformity. 4.  Hepatomegaly and hepatic steatosis.   Original Report Authenticated By: Jeronimo Greaves, M.D.   Ct Abdomen Pelvis W Contrast  08/12/2012   *RADIOLOGY REPORT*  Clinical Data: Right-sided abdominal pain, nausea, diarrhea.  Prior  partial gastrectomy, cholecystectomy, appendectomy.  Hysterectomy.  CT ABDOMEN AND PELVIS WITH CONTRAST  Technique:  Multidetector CT imaging of the abdomen and pelvis was performed following the standard protocol during bolus administration of intravenous contrast.  Contrast: 50mL OMNIPAQUE IOHEXOL 300 MG/ML  SOLN, OMNIPAQUE IOHEXOL 300 MG/ML  SOLN  Comparison: 06/28/2012  Findings: Diffuse hepatic hypodensity compatible with steatosis again noted.  No focal mass lesion.  Evidence of gastrojejunostomy without complicating feature. 1.3 cm left upper renal pole cortical cysts re-identified.  Right kidney, adrenal gland, spleen, and pancreas are unremarkable.  No free air or fluid.  No lymphadenopathy.  No bowel wall thickening or focal segmental dilatation.  Injection granulomas are noted within the subcutaneous fat of the buttocks. Gas within the anterior abdominal wall subcutaneous soft tissues likely indicate injection site.  No acute osseous abnormality. Uterus and ovaries and appendix are surgically absent.  T12 compression deformity is stable.  IMPRESSION: No CT evidence for acute intra-abdominal or pelvic pathology. Postsurgical change as above.  Hepatic steatosis.   Original Report Authenticated By: Christiana Pellant, M.D.    Microbiology: Recent Results (from the past 240 hour(s))  CLOSTRIDIUM DIFFICILE BY PCR     Status: None   Collection Time    08/11/12 10:09 PM      Result Value Range Status   C difficile by pcr NEGATIVE  NEGATIVE Final     Labs: Basic Metabolic Panel:  Recent Labs Lab 08/15/12 0526 08/16/12 0533 08/17/12 0540 08/21/12 0503  NA 141 142 140 141  K 4.3 3.9 3.8 3.6  CL 105 104 104 104  CO2 31 31 30 30   GLUCOSE 86 84 81 82  BUN 6 4* 5* 5*  CREATININE 0.53 0.51 0.63 0.53  CALCIUM 9.3 9.2 8.8 9.0   Liver Function Tests:  Recent Labs Lab 08/15/12 0526 08/16/12 0533  AST 21 23  ALT 16 15  ALKPHOS 127* 127*  BILITOT 0.1* 0.1*  PROT 5.9* 5.9*  ALBUMIN  3.1* 3.1*     CBC:  Recent Labs Lab 08/16/12 0533 08/17/12 0540 08/18/12 0532 08/21/12 0503  WBC 4.0 3.6* 3.6* 5.4  HGB 8.3* 8.6* 8.6* 9.1*  HCT 27.1* 28.5* 28.5* 29.7*  MCV 75.5* 76.0* 76.2* 78.0  PLT 167 156 143* 134*         Signed:  Liesa Tsan C  [facility] 08/21/2012, 10:56 AM

## 2012-08-21 NOTE — Progress Notes (Signed)
UR chart review completed.  

## 2012-08-21 NOTE — Progress Notes (Signed)
Subjective: 5 large stools overnight after molasses enema, followed by N/V. Persistent RLQ discomfort. Slight improvement with narcotics. States she is disgusted. She is aware we have evaluated her thoroughly. Wants to know the plan for further evaluation.   Objective: Vital signs in last 24 hours: Temp:  [97.9 F (36.6 C)-98.1 F (36.7 C)] 98.1 F (36.7 C) (06/02 0500) Pulse Rate:  [62-72] 67 (06/02 0500) Resp:  [16-20] 16 (06/02 0500) BP: (122-159)/(68-97) 122/85 mmHg (06/02 0500) SpO2:  [96 %-100 %] 96 % (06/02 0500) Last BM Date: 08/20/12 General:   Alert and oriented, pleasant Head:  Normocephalic and atraumatic. Eyes:  No icterus, sclera clear. Conjuctiva pink.  Mouth:  Without lesions, mucosa pink and moist.  Heart:  S1, S2 present, no murmurs noted.  Lungs: Clear to auscultation bilaterally, without wheezing, rales, or rhonchi.  Abdomen:  Bowel sounds present, soft, TTP RLQ, pinpoint tenderness at sight of questionable lipoma , non-distended.    Intake/Output from previous day: 06/01 0701 - 06/02 0700 In: 3100 [P.O.:1300; I.V.:1800] Out: -  Intake/Output this shift: Total I/O In: 120 [P.O.:120] Out: -   Lab Results:  Recent Labs  08/21/12 0503  WBC 5.4  HGB 9.1*  HCT 29.7*  PLT 134*   BMET  Recent Labs  08/21/12 0503  NA 141  K 3.6  CL 104  CO2 30  GLUCOSE 82  BUN 5*  CREATININE 0.53  CALCIUM 9.0     Studies/Results: No results found.  Assessment: 57 year old female with complicated medical history as previously outlined, now with persistent RLQ pain. Colonoscopy this admission normal but inadequate preparation. No intubation of terminal ileum due to angulation. Needs early interval f/u. Evaluated by surgery but not found to be a candidate for diagnostic laparoscopy. Recommended referral for pain management. Constipation may be playing part of a role here; she did have good results with milk and molasses enema. Would recommend trial of Linzess,  145 mcg daily. Will follow-up with patient on an outpatient basis in our office. Discussed possibility of referral to tertiary facility at her request. Will discuss this further with GI attending.    Plan: Linzess 145 mcg daily Outpatient follow-up in our office Consider tertiary referral  Nira Retort, ANP-BC [facility]    LOS: 10 days    08/21/2012, 11:31 AM

## 2012-08-22 ENCOUNTER — Telehealth: Payer: Self-pay | Admitting: Gastroenterology

## 2012-08-22 NOTE — Telephone Encounter (Signed)
Im waiting on a call back from [facility] in [place] to place referral

## 2012-08-22 NOTE — Telephone Encounter (Signed)
Patient had low fasting AM cortisol level.  She needs ASAP referral to endocrinologist for ?adrenal insufficiency (could explain some of weight loss but not pain).  Please arrange ASAP.

## 2012-08-23 ENCOUNTER — Other Ambulatory Visit: Payer: Self-pay | Admitting: Gastroenterology

## 2012-08-23 DIAGNOSIS — E2740 Unspecified adrenocortical insufficiency: Secondary | ICD-10-CM

## 2012-08-23 NOTE — Telephone Encounter (Signed)
Referral has been sent to [facility]

## 2012-08-30 ENCOUNTER — Telehealth: Payer: Self-pay | Admitting: Gastroenterology

## 2012-08-30 NOTE — Telephone Encounter (Signed)
Please schedule E30 f/u OV with [facility] only for f/u hospitalization, ?repeat EGD, ?chronic pancreatitis.

## 2012-08-31 NOTE — Telephone Encounter (Signed)
Pt is aware of OV on 7/23 at 3 with [facility] E30 and appt card was mailed

## 2012-09-21 ENCOUNTER — Ambulatory Visit (INDEPENDENT_AMBULATORY_CARE_PROVIDER_SITE_OTHER): Payer: Self-pay | Admitting: Surgery

## 2012-10-11 ENCOUNTER — Telehealth: Payer: Self-pay | Admitting: Gastroenterology

## 2012-10-11 ENCOUNTER — Ambulatory Visit: Admitting: Gastroenterology

## 2012-10-11 NOTE — Telephone Encounter (Signed)
Pt was a no show

## 2012-10-12 NOTE — Telephone Encounter (Signed)
REVIEWED.  

## 2012-10-20 ENCOUNTER — Inpatient Hospital Stay (HOSPITAL_COMMUNITY)
Admission: EM | Admit: 2012-10-20 | Discharge: 2012-10-24 | DRG: 389 | Disposition: A | Discharge status: Home / Self Care | Attending: General Surgery | Admitting: General Surgery

## 2012-10-20 ENCOUNTER — Encounter (HOSPITAL_COMMUNITY): Payer: Self-pay | Admitting: *Deleted

## 2012-10-20 DIAGNOSIS — Z8711 Personal history of peptic ulcer disease: Secondary | ICD-10-CM

## 2012-10-20 DIAGNOSIS — K861 Other chronic pancreatitis: Secondary | ICD-10-CM | POA: Diagnosis present

## 2012-10-20 DIAGNOSIS — M069 Rheumatoid arthritis, unspecified: Secondary | ICD-10-CM | POA: Diagnosis present

## 2012-10-20 DIAGNOSIS — K7689 Other specified diseases of liver: Secondary | ICD-10-CM | POA: Diagnosis present

## 2012-10-20 DIAGNOSIS — R339 Retention of urine, unspecified: Secondary | ICD-10-CM | POA: Diagnosis present

## 2012-10-20 DIAGNOSIS — I10 Essential (primary) hypertension: Secondary | ICD-10-CM | POA: Diagnosis present

## 2012-10-20 DIAGNOSIS — K2100 Gastro-esophageal reflux disease with esophagitis, without bleeding: Secondary | ICD-10-CM | POA: Diagnosis present

## 2012-10-20 DIAGNOSIS — I252 Old myocardial infarction: Secondary | ICD-10-CM

## 2012-10-20 DIAGNOSIS — Z79899 Other long term (current) drug therapy: Secondary | ICD-10-CM

## 2012-10-20 DIAGNOSIS — T4275XA Adverse effect of unspecified antiepileptic and sedative-hypnotic drugs, initial encounter: Secondary | ICD-10-CM | POA: Diagnosis present

## 2012-10-20 DIAGNOSIS — K560 Paralytic ileus: Principal | ICD-10-CM | POA: Diagnosis present

## 2012-10-20 HISTORY — DX: Anemia, unspecified: D64.9

## 2012-10-20 HISTORY — DX: Pneumonia, unspecified organism: J18.9

## 2012-10-20 MED ORDER — ENALAPRILAT 1.25 MG/ML IV SOLN
1.2500 mg | Freq: Four times a day (QID) | INTRAVENOUS | Status: DC
  Administered 2012-10-21 – 2012-10-24 (×14): 1.25 mg via INTRAVENOUS
  Filled 2012-10-20 (×14): qty 2

## 2012-10-20 MED ORDER — HYDROMORPHONE HCL PF 1 MG/ML IJ SOLN
1.0000 mg | INTRAMUSCULAR | Status: DC | PRN
  Administered 2012-10-21 – 2012-10-22 (×4): 1 mg via INTRAVENOUS
  Filled 2012-10-20 (×13): qty 1

## 2012-10-20 MED ORDER — HYDROMORPHONE HCL PF 1 MG/ML IJ SOLN
INTRAMUSCULAR | Status: AC
  Administered 2012-10-20: 1 mg via INTRAVENOUS
  Filled 2012-10-20: qty 1

## 2012-10-20 MED ORDER — ONDANSETRON HCL 4 MG/2ML IJ SOLN
4.0000 mg | Freq: Four times a day (QID) | INTRAMUSCULAR | Status: DC | PRN
  Administered 2012-10-21 – 2012-10-24 (×7): 4 mg via INTRAVENOUS
  Filled 2012-10-20 (×8): qty 2

## 2012-10-20 MED ORDER — ONDANSETRON HCL 4 MG/2ML IJ SOLN
INTRAMUSCULAR | Status: AC
  Administered 2012-10-20: 4 mg via INTRAVENOUS
  Filled 2012-10-20: qty 2

## 2012-10-20 NOTE — ED Provider Notes (Signed)
11:07 PM Pt transfer from [place] with [facility]. Accepted by Dr Lovell Sheehan, surgery. Seen on arrival to ED. No significant issues in transfer. Pt with continued nausea and pain but in NAD. NG in place to wall suction. Small amount of clear secretions in cannister. Admission orders in place.   Raeford Razor, MD 10/20/12 2312

## 2012-10-20 NOTE — ED Notes (Signed)
Abdominal pain that began this morning , pt was taken to [facility] and transported to here. Pt has had prior abdominal surgery 18 months ago for bowel resection, and 4 months ago for bleeding ulcers. Pt has a 14 french NG tube intact and patent attached to ILWS with clear secretions noted.  Pt reports pain and nausea has increased since transfer.  Dr Lovell Sheehan aware of pt's arrival to ED, Pt reports scheduled for small bowel obstruction.

## 2012-10-21 ENCOUNTER — Inpatient Hospital Stay (HOSPITAL_COMMUNITY)

## 2012-10-21 DIAGNOSIS — Z8711 Personal history of peptic ulcer disease: Secondary | ICD-10-CM

## 2012-10-21 DIAGNOSIS — R112 Nausea with vomiting, unspecified: Secondary | ICD-10-CM

## 2012-10-21 LAB — COMPREHENSIVE METABOLIC PANEL
ALT: 21 U/L (ref 0–35)
AST: 30 U/L (ref 0–37)
Albumin: 4.3 g/dL (ref 3.5–5.2)
Calcium: 10.4 mg/dL (ref 8.4–10.5)
Sodium: 142 mEq/L (ref 135–145)
Total Protein: 7.8 g/dL (ref 6.0–8.3)

## 2012-10-21 LAB — CBC
HCT: 40.9 % (ref 36.0–46.0)
MCH: 27 pg (ref 26.0–34.0)
MCV: 84.3 fL (ref 78.0–100.0)
Platelets: 262 10*3/uL (ref 150–400)
RBC: 4.85 MIL/uL (ref 3.87–5.11)

## 2012-10-21 MED ORDER — ENOXAPARIN SODIUM 40 MG/0.4ML ~~LOC~~ SOLN
40.0000 mg | SUBCUTANEOUS | Status: DC
  Filled 2012-10-21: qty 0.4

## 2012-10-21 MED ORDER — FENTANYL 50 MCG/HR TD PT72
50.0000 ug | MEDICATED_PATCH | TRANSDERMAL | Status: DC
  Administered 2012-10-21 – 2012-10-24 (×2): 50 ug via TRANSDERMAL
  Filled 2012-10-21 (×2): qty 1

## 2012-10-21 MED ORDER — POTASSIUM CHLORIDE IN NACL 20-0.9 MEQ/L-% IV SOLN
INTRAVENOUS | Status: DC
  Administered 2012-10-21 (×3): via INTRAVENOUS

## 2012-10-21 MED ORDER — SUCRALFATE 1 GM/10ML PO SUSP
1.0000 g | Freq: Three times a day (TID) | ORAL | Status: DC
  Administered 2012-10-21 – 2012-10-24 (×12): 1 g
  Filled 2012-10-21 (×12): qty 10

## 2012-10-21 MED ORDER — HYDROMORPHONE HCL PF 1 MG/ML IJ SOLN
1.0000 mg | INTRAMUSCULAR | Status: DC | PRN
  Administered 2012-10-21 – 2012-10-24 (×17): 1 mg via INTRAVENOUS
  Filled 2012-10-21 (×8): qty 1

## 2012-10-21 MED ORDER — PANTOPRAZOLE SODIUM 40 MG IV SOLR
40.0000 mg | Freq: Two times a day (BID) | INTRAVENOUS | Status: DC
  Administered 2012-10-21 – 2012-10-23 (×6): 40 mg via INTRAVENOUS
  Filled 2012-10-21 (×6): qty 40

## 2012-10-21 MED ORDER — LORAZEPAM 2 MG/ML IJ SOLN
0.5000 mg | INTRAMUSCULAR | Status: DC | PRN
  Administered 2012-10-21 – 2012-10-22 (×8): 0.5 mg via INTRAVENOUS
  Filled 2012-10-21 (×8): qty 1

## 2012-10-21 MED ORDER — ACETAMINOPHEN 325 MG PO TABS: 650.0000 mg | ORAL_TABLET | Freq: Four times a day (QID) | ORAL | Status: DC | PRN

## 2012-10-21 MED ORDER — CLONIDINE HCL 0.1 MG/24HR TD PTWK
0.1000 mg | MEDICATED_PATCH | Freq: Once | TRANSDERMAL | Status: DC
  Administered 2012-10-21: 0.1 mg via TRANSDERMAL
  Filled 2012-10-21: qty 1

## 2012-10-21 MED ORDER — ACETAMINOPHEN 650 MG RE SUPP: 650.0000 mg | Freq: Four times a day (QID) | RECTAL | Status: DC | PRN

## 2012-10-21 NOTE — Consult Note (Signed)
Referring Provider:  Dr. Lovell Sheehan Primary Care Physician:  Anson [place], DO Primary Gastroenterologist:  Dr.   Jaquita Rector for Consultation:  Abdominal pain? Bowel obstruction   HPI: 57 year old lady with a history of complicated peptic ulcer disease requiring laparotomy in the past presents with acute onset of abdominal pain along with nausea and vomiting since about noon yesterday. Initially, she went to [facility] emergency department. She was evaluated there. CT this was suggestive of a mild partial small bowel obstruction. She was referred down here because the surgical group that had done her surgery in the past was located here. Dr. Lovell Sheehan accepted her in transfer. She reports the acute onset of epigastric pain along with nausea and vomiting around noon yesterday. White blood cell count 10 hemoglobin 13. Amylase and lipase normal. LFTs okay except for a mildly elevated alkaline phosphatase of 167. NGT placed. Very little output. Chest film here demonstrated no obvious bowel obstruction. NG tube in the stomach.  She reports her weight has been stable at 106 pounds. She's been doing fairly well without much in way of abdominal pain over the past several weeks. Back in April of this year she underwent EGD which revealed a 1 cm anastomotic ulcer with active bleeding. This lesion required bleeding controlled therapy. History of iron deficiency anemia, she underwent a colonoscopy as well earlier this year which revealed a poor prep on the right side. She has had hospitalizations for recurrent abdominal pain, nausea and vomiting. She's had a small bowel follow-through, multiple CT scans which have failed reveal a cause of her abdominal pain or clearly document a mechanical small bowel obstruction.  She carries a diagnosis of chronic pancreatitis felt to be secondary to biliary etiology. We tried to get an EUS done last but patient did not follow through.  Previously, serum gastrin and H. pylori  serologies were within normal limits and negative, respectively. She had an MRCP done in [place] back in 2013 which demonstrated a normal-appearing pancreas..  Incidentally, she was noted to have a low fasting serum cortisol earlier this year for which an endocrinology referral was made.  Past Medical History  Diagnosis Date  . RA (rheumatoid arthritis)  2008  . MI (myocardial infarction) 2008    Not well documented, patient reports reassuring cardiac catheterization and stress testing in [place]  . Constipation 03/21/2011  . Hypokalemia 03/21/2011  . Fatty liver 03/21/2011  . Pancreatitis     states 3 years ago, very severe, ?biliary pancreatitis   . T12 compression fracture 2011  . Duodenal ulcer     remote per patient. +BC powders, patient report negative EGD six months ago  . Dilated pancreatic duct     ?chronic pancreatitis, EUS pending (06/02/11)  . Kidney stones   . Chronic abdominal pain     Past Surgical History  Procedure Laterality Date  . Cholecystectomy    . Knee surgery    . Appendectomy    . Complete hysterectomy    . Ventral wall hernia    . Esophagogastroduodenoscopy  08/2010    Dr. Samuella Cota, Versed. small hh, mild prepylori gastritis. path unavailable  . Esophagogastroduodenoscopy  04/2010    Dr. Samuella Cota, Versed. small hh, mild distal esophagitis, antral gastritis and duodenitis. Bx mild chronic gastritis and no H.pylori  . Esophagogastroduodenoscopy  08/2009    Dr. Allena Katz, Versed. Moderate gastritis, moderate duodenitis with nodularity in proximal duodenal bulb, bx chronic gastritis, no helicobacter, mild chronic duodenitis  . Esophagogastroduodenoscopy  02/2007    Dr.  Pandya, Versed. antral gastritis and duodenal ulcers. Bx mild chronic gastritis. No bx from duodenal ulcer available.  Gaspar Bidding dilation  04/06/2011    Procedure: SAVORY DILATION;  Surgeon: Arlyce Harman, MD;  Location: [facility];  Service: Endoscopy;  Laterality: N/A;  16 mm  .  Esophagogastroduodenoscopy  04/06/11    Dr. Darrick Penna: 1-2 cm hiatal hernia, mod gastritis, 29mmX3mm linear ulcer in duodenal bulb, stricture 1st part of duodenum, dilated to 12 mm  . Esophagogastroduodenoscopy  05/12/11    partial gastric oulet obstruction secondary to stricture between bulb/2nd portion of duodenum with marked friability and inflammation but no discrete ulcer, dilated stomach. s/p dilation but high risk for restenosis  . Esophagogastroduodenoscopy  06/22/2011    QMV:[id number] ring/Moderate gastritis/PERSISTENT Ulcer in the bulb/descending duodenum Stricture in the bulb/descending duodenum  . Gastrojejunostomy  06/28/2011    Procedure: GASTROJEJUNOSTOMY;  Surgeon: Fabio Bering, MD;  Location: [facility];  Service: General;  Laterality: N/A;  Roux-en-y, Choledocalduodenostomy  . Abdominal hysterectomy    . Kidney stone surgery    . Esophagogastroduodenoscopy Left 06/25/2012    RMR: Normal esophagus. Surgically altered anastomosis to  small intestine noted. Couple of  at the anastomosis.  Patent efferent limb Patient had an oozing 1 cm anastomotic ulcer  . Back surgery  2013  . Colonoscopy N/A 08/16/2012    Procedure: COLONOSCOPY;  Surgeon: Corbin Ade, MD;  Location: [facility];  Service: Endoscopy;  Laterality: N/A;  Allergic to Phenergan. Last procedure performed with Versed, Demerol, and Zofran. Give Zofran 4 mg IV on call    Prior to Admission medications   Medication Sig Start Date End Date Taking? Authorizing Provider  acetaminophen (TYLENOL) 325 MG tablet Take 650 mg by mouth every 6 (six) hours as needed for pain.   Yes Historical Provider, MD  ALPRAZolam Prudy Feeler) 0.5 MG tablet Take 0.5 mg by mouth 2 (two) times daily.   Yes Historical Provider, MD  lipase/protease/amylase (CREON-10/PANCREASE) 12000 UNITS CPEP Take 3 capsules by mouth 3 (three) times daily before meals. 06/29/12  Yes Nimish Normajean Glasgow, MD  oxyCODONE (ROXICODONE) 5 MG immediate release tablet Take 2 tablets (10 mg  total) by mouth every 6 (six) hours as needed for pain. 08/21/12  Yes Nimish Normajean Glasgow, MD  POTASSIUM PO Take 1 tablet by mouth daily.   Yes Historical Provider, MD  zolpidem (AMBIEN) 5 MG tablet Take 5 mg by mouth at bedtime as needed for sleep.   Yes Historical Provider, MD  ondansetron (ZOFRAN) 4 MG tablet Take 1 tablet (4 mg total) by mouth every 6 (six) hours as needed for nausea. 07/11/12   Christiane Ha, MD    Current Facility-Administered Medications  Medication Dose Route Frequency Provider Last Rate Last Dose  . 0.9 % NaCl with KCl 20 mEq/ L  infusion   Intravenous Continuous Dalia Heading, MD 100 mL/hr at 10/21/12 0131    . acetaminophen (TYLENOL) tablet 650 mg  650 mg Oral Q6H PRN Dalia Heading, MD       Or  . acetaminophen (TYLENOL) suppository 650 mg  650 mg Rectal Q6H PRN Dalia Heading, MD      . cloNIDine (CATAPRES - Dosed in mg/24 hr) patch 0.1 mg  0.1 mg Transdermal Once Dalia Heading, MD   0.1 mg at 10/21/12 0034  . enalaprilat (VASOTEC) injection 1.25 mg  1.25 mg Intravenous Q6H Dalia Heading, MD   1.25 mg at 10/21/12 1031  . enoxaparin (LOVENOX)  injection 40 mg  40 mg Subcutaneous Q24H Dalia Heading, MD      . fentaNYL (DURAGESIC - dosed mcg/hr) 50 mcg  50 mcg Transdermal Q72H Dalia Heading, MD   50 mcg at 10/21/12 1034  . HYDROmorphone (DILAUDID) injection 1 mg  1 mg Intravenous Q3H PRN Dalia Heading, MD   1 mg at 10/20/12 2310  . HYDROmorphone (DILAUDID) injection 1 mg  1 mg Intravenous Q2H PRN Dalia Heading, MD   1 mg at 10/21/12 0827  . LORazepam (ATIVAN) injection 0.5 mg  0.5 mg Intravenous Q4H PRN Dalia Heading, MD   0.5 mg at 10/21/12 1038  . ondansetron (ZOFRAN) injection 4 mg  4 mg Intravenous Q6H PRN Dalia Heading, MD   4 mg at 10/21/12 0555  . sucralfate (CARAFATE) 1 GM/10ML suspension 1 g  1 g Per Tube TID WC & HS Dalia Heading, MD   1 g at 10/21/12 1034    Allergies as of 10/20/2012 - Review Complete 10/20/2012  Allergen Reaction Noted  .  Compazine Anaphylaxis 03/20/2011  . Promethazine hcl Anaphylaxis 03/20/2011  . Vistaril (hydroxyzine hcl) Other (See Comments) 03/20/2011  . Benadryl (diphenhydramine hcl) Other (See Comments) 06/25/2012  . Gabapentin Other (See Comments) 07/21/2011  . Latex Swelling 06/28/2011    Family History  Problem Relation Age of Onset  . Colon cancer Neg Hx   . Liver cancer Neg Hx   . Breast cancer Neg Hx   . Inflammatory bowel disease Neg Hx   . Heart attack Mother     History   Social History  . Marital Status: Divorced    Spouse Name: N/A    Number of Children: 1  . Years of Education: N/A   Occupational History  . unemployed    Social History Main Topics  . Smoking status: Never Smoker   . Smokeless tobacco: Not on file  . Alcohol Use: No  . Drug Use: No  . Sexually Active: Yes    Birth Control/ Protection: Surgical   Other Topics Concern  . Not on file   Social History Narrative  . No narrative on file    Review of Systems: Gen: Denies any fever, chills, sweats,  CV: Denies chest pain, angina, palpitations, syncope, orthopnea, PND, peripheral edema, and claudication. Resp: Denies dyspnea at rest, dyspnea with exercise, cough, sputum, wheezing, coughing up blood, and pleurisy. GI: Denies vomiting blood, jaundice, and fecal incontinence.   Denies dysphagia or odynophagia. Derm: Denies rash, itching, dry skin, hives, moles, warts, or unhealing ulcers.  Psych: Denies depression, anxiety, memory loss, . Heme: Denies bruising, bleeding, and enlarged lymph nodes.   Physical Exam: Vital signs in last 24 hours: Temp:  [98.4 F (36.9 C)-99.3 F (37.4 C)] 98.4 F (36.9 C) (08/02 4098) Pulse Rate:  [71-90] 71 (08/02 1032) Resp:  [16-18] 16 (08/02 1032) BP: (157-188)/(99-116) 162/104 mmHg (08/02 1032) SpO2:  [92 %-96 %] 96 % (08/02 0633) Weight:  [106 lb (48.081 kg)-109 lb 9.6 oz (49.714 kg)] 109 lb 9.6 oz (49.714 kg) (08/02 0112) Last BM Date: 10/20/12 General:     Somewhat ill-appearing lady vomiting into the trash can when I came to see her. She has NG tube in place with minimal drainage.  Head:  Normocephalic and atraumatic. Eyes:  Sclera clear, no icterus.   Conjunctiva pink. Ears:  Normal auditory acuity. Nose:  No deformity, discharge,  or lesions. Mouth:  No deformity or lesions, dentition normal. Neck:  Supple;  no masses or thyromegaly. Lungs:  Clear throughout to auscultation.   No wheezes, crackles, or rhonchi. No acute distress. Heart:  Regular rate and rhythm; no murmurs, clicks, rubs,  or gallops. Abdomen:   flat. Bowel sounds are present. She has mild epigastric tenderness to palpation. No appreciable mass or organomegaly   Pulses:  Normal pulses noted. Extremities:  Without clubbing or edema. Neurologic:  Alert and  oriented x4;  grossly normal neurologically. Skin:  Intact without significant lesions or rashes. Cervical Nodes:  No significant cervical adenopathy. Psych:  Alert and cooperative. Normal mood and affect.  Lab Results:  Recent Labs  10/21/12 0554  WBC 10.0  HGB 13.1  HCT 40.9  PLT 262   BMET  Recent Labs  10/21/12 0554  NA 142  K 4.1  CL 100  CO2 34*  GLUCOSE 109*  BUN 5*  CREATININE 0.62  CALCIUM 10.4   LFT  Recent Labs  10/21/12 0554  PROT 7.8  ALBUMIN 4.3  AST 30  ALT 21  ALKPHOS 167*  BILITOT 0.4  Studies/Results: Dg Chest Port 1 View  10/21/2012   *RADIOLOGY REPORT*  Clinical Data: NG tube placement  PORTABLE CHEST - 1 VIEW  Comparison: Prior radiograph from 07/10/2012  Findings: There has been interval placement of an enteric tube with side holes seen beyond the GE junction.  The tip is not definitely visualized.  The cardiac and mediastinal silhouettes are unchanged.  The lungs remain clear with diffuse prominence of the interstitial markings and bronchitic changes.  Sequelae of prior vertebroplasty noted within the mid thoracic spine, unchanged.  Surgical clips overlie the right upper  quadrant.  IMPRESSION: Side hole of the enteric tube overlying the stomach well beyond the GE junction   Original Report Authenticated By: Rise Mu, M.D.      Impression:  57 year old lady with a long complicated history of peptic ulcer disease and chronic abdominal pain admitted to the hospital with recurrent abdominal pain nausea and vomiting. To date, she's never really displayed convincing evidence of a mechanical bowel obstruction on imaging studies. She has a history of anastomotic ulcer found in April of this year which was actively bleeding requiring endoscopic intervention. She adamantly denies any NSAID use currently.  Although I am less likely inclined to believe that she has ongoing peptic ulcer disease which would account for her recent acute symptoms, it would be appropriate to reassess at this time.  She likely has an element of chronic abdominal pain which is not curable.   Recommendations:    Add a PPI to her regimen. Protonix should be okay. I checked with the pharmacist  (I actually called Kathlene November in pharmacy on 2 different occasions about the Epic flag that came up regarding interaction between Protonix and promethazine).  He told me that in this situation the flag was erroneous and it would be absolutely fine to prescribe Protonix in this individual.  Plan for diagnostic EGD on April 4. I discussed this approach with the patient along with risks and benefits. She is agreeable. Depending on what is found,  perhaps a CTE or MRE  might be helpful in evaluating her small bowel further as discussed with Dr. Lovell Sheehan.  Ultimately, she may end up with a laparoscopy/laparotomy prior to a formal pain management referral.  Review outside CT images with our radiologist as feasible.  Further recommendations to follow. I'd like to thank Dr. Lovell Sheehan for allowing me to see this lady once again today.

## 2012-10-21 NOTE — Progress Notes (Signed)
Pt states she feels her bladder is full.  Assisted to [facility], unable to void on her own.  Bladder scan done, showed >959mL in bladder.  Notified Dr Lovell Sheehan, order to place foley.  Foley placed using sterile technique, pt tolerated well.  Clear yellow urine draining into urine bag at this time.

## 2012-10-21 NOTE — ED Notes (Signed)
C/o increased pain. NGT irrigated with 30cc ns. No resistance pushing in and returned easily. NGT patent

## 2012-10-21 NOTE — H&P (Signed)
Jordan Haney is an 57 y.o. female.   Chief Complaint: Abdominal pain HPI: Patient is a 57 year old white female who has had multiple missions in the past for abdominal pain. She does have a history of chronic pancreatitis as well as a marginal ulceration of a Roux-en-Y gastrojejunostomy. She went to the emergency room in [place] where a CT scan of the abdomen and pelvis showed possible high-grade bowel obstruction. I was consulted as surgery wanted her transferred to [facility]. Patient states this pain is a little sharper than usual. Her last bowel movement was yesterday. Nasogastric tube placed in [place] showed a few 100 cc of gastric drainage noted. No blood was present.  Past Medical History  Diagnosis Date  . RA (rheumatoid arthritis)  2008  . MI (myocardial infarction) 2008    Not well documented, patient reports reassuring cardiac catheterization and stress testing in [place]  . Constipation 03/21/2011  . Hypokalemia 03/21/2011  . Fatty liver 03/21/2011  . Pancreatitis     states 3 years ago, very severe, ?biliary pancreatitis   . T12 compression fracture 2011  . Duodenal ulcer     remote per patient. +BC powders, patient report negative EGD six months ago  . Dilated pancreatic duct     ?chronic pancreatitis, EUS pending (06/02/11)  . Kidney stones   . Chronic abdominal pain     Past Surgical History  Procedure Laterality Date  . Cholecystectomy    . Knee surgery    . Appendectomy    . Complete hysterectomy    . Ventral wall hernia    . Esophagogastroduodenoscopy  08/2010    Dr. Samuella Cota, Versed. small hh, mild prepylori gastritis. path unavailable  . Esophagogastroduodenoscopy  04/2010    Dr. Samuella Cota, Versed. small hh, mild distal esophagitis, antral gastritis and duodenitis. Bx mild chronic gastritis and no H.pylori  . Esophagogastroduodenoscopy  08/2009    Dr. Allena Katz, Versed. Moderate gastritis, moderate duodenitis with nodularity in proximal duodenal  bulb, bx chronic gastritis, no helicobacter, mild chronic duodenitis  . Esophagogastroduodenoscopy  02/2007    Dr. Samuella Cota, Versed. antral gastritis and duodenal ulcers. Bx mild chronic gastritis. No bx from duodenal ulcer available.  Gaspar Bidding dilation  04/06/2011    Procedure: SAVORY DILATION;  Surgeon: Arlyce Harman, MD;  Location: [facility];  Service: Endoscopy;  Laterality: N/A;  16 mm  . Esophagogastroduodenoscopy  04/06/11    Dr. Darrick Penna: 1-2 cm hiatal hernia, mod gastritis, 87mmX3mm linear ulcer in duodenal bulb, stricture 1st part of duodenum, dilated to 12 mm  . Esophagogastroduodenoscopy  05/12/11    partial gastric oulet obstruction secondary to stricture between bulb/2nd portion of duodenum with marked friability and inflammation but no discrete ulcer, dilated stomach. s/p dilation but high risk for restenosis  . Esophagogastroduodenoscopy  06/22/2011    MVH:[id number]'M ring/Moderate gastritis/PERSISTENT Ulcer in the bulb/descending duodenum Stricture in the bulb/descending duodenum  . Gastrojejunostomy  06/28/2011    Procedure: GASTROJEJUNOSTOMY;  Surgeon: Fabio Bering, MD;  Location: [facility];  Service: General;  Laterality: N/A;  Roux-en-y, Choledocalduodenostomy  . Abdominal hysterectomy    . Kidney stone surgery    . Esophagogastroduodenoscopy Left 06/25/2012    RMR: Normal esophagus. Surgically altered anastomosis to  small intestine noted. Couple of  at the anastomosis.  Patent efferent limb Patient had an oozing 1 cm anastomotic ulcer  . Back surgery  2013  . Colonoscopy N/A 08/16/2012    Procedure: COLONOSCOPY;  Surgeon: Corbin Ade, MD;  Location: [facility];  Service: Endoscopy;  Laterality: N/A;  Allergic to Phenergan. Last procedure performed with Versed, Demerol, and Zofran. Give Zofran 4 mg IV on call    Family History  Problem Relation Age of Onset  . Colon cancer Neg Hx   . Liver cancer Neg Hx   . Breast cancer Neg Hx   . Inflammatory bowel disease Neg Hx   . Heart  attack Mother    Social History:  reports that she has never smoked. She does not have any smokeless tobacco history on file. She reports that she does not drink alcohol or use illicit drugs.  Allergies:  Allergies  Allergen Reactions  . Compazine Anaphylaxis  . Promethazine Hcl Anaphylaxis  . Vistaril (Hydroxyzine Hcl) Other (See Comments)    Reaction: legs shake  . Benadryl (Diphenhydramine Hcl) Other (See Comments)    Pt states, "it makes my legs shake really bad"   . Gabapentin Other (See Comments)    Reactions: legs shake  . Latex Swelling    Medications Prior to Admission  Medication Sig Dispense Refill  . acetaminophen (TYLENOL) 325 MG tablet Take 2 tablets (650 mg total) by mouth every 6 (six) hours as needed.      . ALPRAZolam (XANAX) 0.5 MG tablet Take 0.5 mg by mouth 2 (two) times daily.      . lipase/protease/amylase (CREON-10/PANCREASE) 12000 UNITS CPEP Take 3 capsules by mouth 3 (three) times daily before meals.  270 capsule  0  . ondansetron (ZOFRAN) 4 MG tablet Take 1 tablet (4 mg total) by mouth every 6 (six) hours as needed for nausea.  20 tablet    . OxyCODONE (OXYCONTIN) 10 mg T12A Take 1 tablet (10 mg total) by mouth every 12 (twelve) hours.  60 tablet  0  . oxyCODONE (ROXICODONE) 5 MG immediate release tablet Take 2 tablets (10 mg total) by mouth every 6 (six) hours as needed for pain.  30 tablet  0  . sucralfate (CARAFATE) 1 GM/10ML suspension Take 10 mLs (1 g total) by mouth 4 (four) times daily -  with meals and at bedtime.  420 mL  0  . zolpidem (AMBIEN) 5 MG tablet Take 5 mg by mouth at bedtime as needed for sleep.        Results for orders placed during the hospital encounter of 10/20/12 (from the past 48 hour(s))  COMPREHENSIVE METABOLIC PANEL     Status: Abnormal   Collection Time    10/21/12  5:54 AM      Result Value Range   Sodium 142  135 - 145 mEq/L   Potassium 4.1  3.5 - 5.1 mEq/L   Chloride 100  96 - 112 mEq/L   CO2 34 (*) 19 - 32 mEq/L    Glucose, Bld 109 (*) 70 - 99 mg/dL   BUN 5 (*) 6 - 23 mg/dL   Creatinine, Ser 4.54  0.50 - 1.10 mg/dL   Calcium 09.8  8.4 - 11.9 mg/dL   Total Protein 7.8  6.0 - 8.3 g/dL   Albumin 4.3  3.5 - 5.2 g/dL   AST 30  0 - 37 U/L   ALT 21  0 - 35 U/L   Alkaline Phosphatase 167 (*) 39 - 117 U/L   Total Bilirubin 0.4  0.3 - 1.2 mg/dL   GFR calc non Af Amer >90  >90 mL/min   GFR calc Af Amer >90  >90 mL/min   Comment:  The eGFR has been calculated     using the CKD EPI equation.     This calculation has not been     validated in all clinical     situations.     eGFR's persistently     <90 mL/min signify     possible Chronic Kidney Disease.  MAGNESIUM     Status: None   Collection Time    10/21/12  5:54 AM      Result Value Range   Magnesium 2.2  1.5 - 2.5 mg/dL  PHOSPHORUS     Status: Abnormal   Collection Time    10/21/12  5:54 AM      Result Value Range   Phosphorus 5.0 (*) 2.3 - 4.6 mg/dL  CBC     Status: Abnormal   Collection Time    10/21/12  5:54 AM      Result Value Range   WBC 10.0  4.0 - 10.5 K/uL   RBC 4.85  3.87 - 5.11 MIL/uL   Hemoglobin 13.1  12.0 - 15.0 g/dL   HCT 16.1  09.6 - 04.5 %   MCV 84.3  78.0 - 100.0 fL   MCH 27.0  26.0 - 34.0 pg   MCHC 32.0  30.0 - 36.0 g/dL   RDW 40.9 (*) 81.1 - 91.4 %   Platelets 262  150 - 400 K/uL  AMYLASE     Status: None   Collection Time    10/21/12  5:54 AM      Result Value Range   Amylase 89  0 - 105 U/L  LIPASE, BLOOD     Status: None   Collection Time    10/21/12  5:54 AM      Result Value Range   Lipase 35  11 - 59 U/L   Dg Chest Port 1 View  10/21/2012   *RADIOLOGY REPORT*  Clinical Data: NG tube placement  PORTABLE CHEST - 1 VIEW  Comparison: Prior radiograph from 07/10/2012  Findings: There has been interval placement of an enteric tube with side holes seen beyond the GE junction.  The tip is not definitely visualized.  The cardiac and mediastinal silhouettes are unchanged.  The lungs remain clear with  diffuse prominence of the interstitial markings and bronchitic changes.  Sequelae of prior vertebroplasty noted within the mid thoracic spine, unchanged.  Surgical clips overlie the right upper quadrant.  IMPRESSION: Side hole of the enteric tube overlying the stomach well beyond the GE junction   Original Report Authenticated By: Rise Mu, M.D.    Review of Systems  Constitutional: Positive for weight loss and malaise/fatigue.  HENT: Negative.   Eyes: Negative.   Respiratory: Negative.   Cardiovascular: Negative.   Gastrointestinal: Positive for heartburn, nausea, vomiting and abdominal pain.  Genitourinary: Negative.   Musculoskeletal: Negative.   Skin: Negative.   All other systems reviewed and are negative.    Blood pressure 157/99, pulse 76, temperature 98.4 F (36.9 C), temperature source Oral, resp. rate 18, height 5\' 4"  (1.626 m), weight 49.714 kg (109 lb 9.6 oz), SpO2 96.00%. Physical Exam  Constitutional: She is oriented to person, place, and time. She appears well-developed.  HENT:  Head: Normocephalic and atraumatic.  Neck: Normal range of motion. Neck supple.  Cardiovascular: Normal rate, regular rhythm and normal heart sounds.   Respiratory: Effort normal and breath sounds normal.  GI: Soft. Bowel sounds are normal. She exhibits no distension. There is tenderness.  States she is tender to palpation in  the epigastric region. Surgical incisions healing well.  Neurological: She is alert and oriented to person, place, and time.  Skin: Skin is warm and dry.     Assessment/Plan Impression: Possible small bowel obstruction versus ileus. Patient's symptoms seem to be more consistent with her recurrent chronic pancreatitis or anastomotic marginal ulceration. She is a complex patient and his difficulty to discern what is going on. She has had minimal NG tube output since her admission, despite adequate positioning of her NG tube. We'll get GI input as they have seen  her in the past. There is no need for acute surgical intervention. She also has malignant hypertension. She has been started on enalapril and Catapres patch. Will do followup KUB in a.m. She did have an upper GI series earlier this year which revealed no significant slowing of her small bowel transit time.  Alee Katen A 10/21/2012, 8:41 AM

## 2012-10-21 NOTE — ED Notes (Signed)
Spoke with Dr Lovell Sheehan re: hypertension. Orders received.

## 2012-10-22 ENCOUNTER — Inpatient Hospital Stay (HOSPITAL_COMMUNITY)

## 2012-10-22 MED ORDER — KCL IN DEXTROSE-NACL 20-5-0.45 MEQ/L-%-% IV SOLN
INTRAVENOUS | Status: DC
  Administered 2012-10-22 – 2012-10-23 (×2): via INTRAVENOUS

## 2012-10-22 MED ORDER — SODIUM CHLORIDE 0.9 % IV SOLN
INTRAVENOUS | Status: DC
  Administered 2012-10-22: 22:00:00 via INTRAVENOUS

## 2012-10-22 MED ORDER — SODIUM CHLORIDE 0.9 % IV SOLN: INTRAVENOUS | Status: DC

## 2012-10-22 NOTE — Progress Notes (Signed)
Pt continues to refuse SCD's.  Discussed risk of DVT d/t limited movement.

## 2012-10-22 NOTE — Progress Notes (Signed)
Subjective: Patient still has NG tube. Complains of epigastric pain. No real improvement with alleviation of urinary retention yesterday.  KUB negative for obstructive pattern today. NGT in stomach  Objective: Vital signs in last 24 hours: Temp:  [98.2 F (36.8 C)-99 F (37.2 C)] 98.2 F (36.8 C) (08/03 0532) Pulse Rate:  [64-75] 72 (08/03 1058) Resp:  [16] 16 (08/03 0532) BP: (132-174)/(78-112) 132/78 mmHg (08/03 1058) SpO2:  [97 %-98 %] 97 % (08/03 0532) Last BM Date: 10/20/12 General:   Somewhat sedated lady resting comfortably with NG tube in place. Minimal NG drainage noted Abdomen:  Flat  and active bowel sounds. Soft with some epigastric tenderness to palpation. No appreciable mass or organomegaly without guarding, and without rebound.  No mass or organomegaly. Extremities:  Without clubbing or edema.    Recent Labs  10/21/12 0554  WBC 10.0  HGB 13.1  HCT 40.9  PLT 262   BMET  Recent Labs  10/21/12 0554  NA 142  K 4.1  CL 100  CO2 34*  GLUCOSE 109*  BUN 5*  CREATININE 0.62  CALCIUM 10.4   LFT  Recent Labs  10/21/12 0554  PROT 7.8  ALBUMIN 4.3  AST 30  ALT 21  ALKPHOS 167*  BILITOT 0.4  Studies/Results: Dg Abd 1 View  10/22/2012   *RADIOLOGY REPORT*  Clinical Data: Abdominal pain.  ABDOMEN - 1 VIEW  Comparison: 08/13/2012  Findings: NG tube is in the stomach.  Nonobstructive bowel gas pattern.  No supine evidence of free air.  Prior cholecystectomy.  IMPRESSION: No acute findings.  No evidence of obstruction.   Original Report Authenticated By: Charlett Nose, M.D.   Dg Chest Port 1 View  10/21/2012   *RADIOLOGY REPORT*  Clinical Data: NG tube placement  PORTABLE CHEST - 1 VIEW  Comparison: Prior radiograph from 07/10/2012  Findings: There has been interval placement of an enteric tube with side holes seen beyond the GE junction.  The tip is not definitely visualized.  The cardiac and mediastinal silhouettes are unchanged.  The lungs remain clear with  diffuse prominence of the interstitial markings and bronchitic changes.  Sequelae of prior vertebroplasty noted within the mid thoracic spine, unchanged.  Surgical clips overlie the right upper quadrant.  IMPRESSION: Side hole of the enteric tube overlying the stomach well beyond the GE junction   Original Report Authenticated By: Rise Mu, M.D.     Impression:  Persisting abdominal pain. History of peptic ulcer disease (complicated). I agree with Dr. Lovell Sheehan, no acute surgical process at this time. We need to determine the status of the previously noted gastric ulcer.  Recommendations:    EGD tomorrow.The risks, benefits, limitations, alternatives and imponderables have been reviewed with the patient. Potential for esophageal dilation, biopsy, etc. have also been reviewed.  Questions have been answered. All parties agreeable.  Lovenox will be discontinued in anticipation of tomorrow's procedure.

## 2012-10-22 NOTE — Progress Notes (Signed)
  Subjective: Had urinary retention yesterday. Almost 1 L of urine noted in bladder. Foley placed. States she still has epigastric pain. She is also very drowsy.  Objective: Vital signs in last 24 hours: Temp:  [98.2 F (36.8 C)-99 F (37.2 C)] 98.2 F (36.8 C) (08/03 0532) Pulse Rate:  [64-75] 74 (08/03 0532) Resp:  [16] 16 (08/03 0532) BP: (159-174)/(93-112) 161/95 mmHg (08/03 0532) SpO2:  [97 %-98 %] 97 % (08/03 0532) Last BM Date: 10/20/12  Intake/Output from previous day: 08/02 0701 - 08/03 0700 In: 2741.7 [I.V.:2741.7] Out: 1500 [Urine:1500] Intake/Output this shift:    General appearance: cooperative and no distress Resp: clear to auscultation bilaterally Cardio: regular rate and rhythm, S1, S2 normal, no murmur, click, rub or gallop GI: Soft. Flat. Minimal bowel sounds appreciated. States she is slightly tender in the epigastric region.  Lab Results:   Recent Labs  10/21/12 0554  WBC 10.0  HGB 13.1  HCT 40.9  PLT 262   BMET  Recent Labs  10/21/12 0554  NA 142  K 4.1  CL 100  CO2 34*  GLUCOSE 109*  BUN 5*  CREATININE 0.62  CALCIUM 10.4   PT/INR No results found for this basename: LABPROT, INR,  in the last 72 hours  Studies/Results: Dg Chest Port 1 View  10/21/2012   *RADIOLOGY REPORT*  Clinical Data: NG tube placement  PORTABLE CHEST - 1 VIEW  Comparison: Prior radiograph from 07/10/2012  Findings: There has been interval placement of an enteric tube with side holes seen beyond the GE junction.  The tip is not definitely visualized.  The cardiac and mediastinal silhouettes are unchanged.  The lungs remain clear with diffuse prominence of the interstitial markings and bronchitic changes.  Sequelae of prior vertebroplasty noted within the mid thoracic spine, unchanged.  Surgical clips overlie the right upper quadrant.  IMPRESSION: Side hole of the enteric tube overlying the stomach well beyond the GE junction   Original Report Authenticated By: Rise Mu, M.D.    Anti-infectives: Anti-infectives   None      Assessment/Plan: Impression: Small bowel obstruction versus ileus. She states she has not had a bowel movement since admission, though minimal bowel sounds appreciated and there has been minimal NG tube output. I suspect this is secondary to narcotic use. She does not have a surgical abdomen. Ileus seems more likely the diagnosis. There may be a component of adhesive disease, but is not critical at this point. I appreciate Dr. Luvenia Starch input. We'll leave Foley in for today due to her urinary retention. KUB is pending.  LOS: 2 days    Ramiel Forti A 10/22/2012

## 2012-10-23 ENCOUNTER — Encounter (HOSPITAL_COMMUNITY)
Admission: EM | Disposition: A | Discharge status: Home / Self Care | Payer: Self-pay | Source: Home / Self Care | Attending: General Surgery

## 2012-10-23 ENCOUNTER — Encounter (HOSPITAL_COMMUNITY): Payer: Self-pay | Admitting: *Deleted

## 2012-10-23 DIAGNOSIS — R112 Nausea with vomiting, unspecified: Secondary | ICD-10-CM

## 2012-10-23 DIAGNOSIS — K2100 Gastro-esophageal reflux disease with esophagitis, without bleeding: Secondary | ICD-10-CM

## 2012-10-23 DIAGNOSIS — Z8711 Personal history of peptic ulcer disease: Secondary | ICD-10-CM

## 2012-10-23 HISTORY — PX: ESOPHAGOGASTRODUODENOSCOPY: SHX5428

## 2012-10-23 SURGERY — EGD (ESOPHAGOGASTRODUODENOSCOPY)
Anesthesia: Moderate Sedation | Laterality: Left

## 2012-10-23 MED ORDER — BUTAMBEN-TETRACAINE-BENZOCAINE 2-2-14 % EX AERO
INHALATION_SPRAY | CUTANEOUS | Status: DC | PRN
  Administered 2012-10-23: 2 via TOPICAL

## 2012-10-23 MED ORDER — MEPERIDINE HCL 100 MG/ML IJ SOLN
INTRAMUSCULAR | Status: DC | PRN
  Administered 2012-10-23 (×2): 50 mg via INTRAVENOUS

## 2012-10-23 MED ORDER — SODIUM CHLORIDE 0.9 % IV SOLN
Freq: Once | INTRAVENOUS | Status: AC
  Administered 2012-10-23: 14:00:00 via INTRAVENOUS

## 2012-10-23 MED ORDER — MEPERIDINE HCL 100 MG/ML IJ SOLN
INTRAMUSCULAR | Status: AC
  Filled 2012-10-23: qty 2

## 2012-10-23 MED ORDER — MIDAZOLAM HCL 5 MG/5ML IJ SOLN
INTRAMUSCULAR | Status: AC
  Filled 2012-10-23: qty 5

## 2012-10-23 MED ORDER — MIDAZOLAM HCL 5 MG/5ML IJ SOLN
INTRAMUSCULAR | Status: AC
  Filled 2012-10-23: qty 10

## 2012-10-23 MED ORDER — ONDANSETRON HCL 4 MG/2ML IJ SOLN
INTRAMUSCULAR | Status: DC | PRN
  Administered 2012-10-23: 4 mg via INTRAVENOUS

## 2012-10-23 MED ORDER — MIDAZOLAM HCL 5 MG/5ML IJ SOLN
INTRAMUSCULAR | Status: DC | PRN
  Administered 2012-10-23 (×2): 2 mg via INTRAVENOUS
  Administered 2012-10-23: 1 mg via INTRAVENOUS

## 2012-10-23 MED ORDER — STERILE WATER FOR IRRIGATION IR SOLN
Status: DC | PRN
  Administered 2012-10-23: 15:00:00

## 2012-10-23 MED ORDER — ONDANSETRON HCL 4 MG/2ML IJ SOLN
INTRAMUSCULAR | Status: AC
  Filled 2012-10-23: qty 2

## 2012-10-23 MED ORDER — ENOXAPARIN SODIUM 40 MG/0.4ML ~~LOC~~ SOLN
40.0000 mg | SUBCUTANEOUS | Status: DC
  Administered 2012-10-23: 40 mg via SUBCUTANEOUS
  Filled 2012-10-23: qty 0.4

## 2012-10-23 NOTE — Progress Notes (Signed)
UR Chart Review Completed  

## 2012-10-23 NOTE — Progress Notes (Signed)
  Subjective: No new complaints.  Objective: Vital signs in last 24 hours: Temp:  [98.5 F (36.9 C)-98.8 F (37.1 C)] 98.6 F (37 C) (08/04 0616) Pulse Rate:  [50-73] 73 (08/04 0616) Resp:  [16-18] 16 (08/04 0616) BP: (125-153)/(78-82) 152/78 mmHg (08/04 0616) SpO2:  [95 %-100 %] 98 % (08/04 0616) Last BM Date: 10/20/12  Intake/Output from previous day: 08/03 0701 - 08/04 0700 In: 573.2 [I.V.:573.2] Out: 1150 [Urine:1150] Intake/Output this shift:    General appearance: fatigued and no distress Resp: clear to auscultation bilaterally Cardio: regular rate and rhythm, S1, S2 normal, no murmur, click, rub or gallop GI: Soft, flat. No specific tenderness noted. Minimal bowel sounds appreciated.  Lab Results:   Recent Labs  10/21/12 0554  WBC 10.0  HGB 13.1  HCT 40.9  PLT 262   BMET  Recent Labs  10/21/12 0554  NA 142  K 4.1  CL 100  CO2 34*  GLUCOSE 109*  BUN 5*  CREATININE 0.62  CALCIUM 10.4   PT/INR No results found for this basename: LABPROT, INR,  in the last 72 hours  Studies/Results: Dg Abd 1 View  10/22/2012   *RADIOLOGY REPORT*  Clinical Data: Abdominal pain.  ABDOMEN - 1 VIEW  Comparison: 08/13/2012  Findings: NG tube is in the stomach.  Nonobstructive bowel gas pattern.  No supine evidence of free air.  Prior cholecystectomy.  IMPRESSION: No acute findings.  No evidence of obstruction.   Original Report Authenticated By: Charlett Nose, M.D.    Anti-infectives: Anti-infectives   None      Assessment/Plan: s/p Procedure(s): ESOPHAGOGASTRODUODENOSCOPY (EGD) Impression: Ileus secondary to narcotic use. Doubt mechanical bowel obstruction. For EGD today. Gastric tube removed as it was almost out anyway. May be advanced on diet pending EGD results.  LOS: 3 days    Koleton Duchemin A 10/23/2012

## 2012-10-23 NOTE — Care Management Note (Signed)
    Page 1 of 1   10/24/2012     8:21:32 AM   CARE MANAGEMENT NOTE 10/24/2012  Patient:  Jordan Haney, Jordan Haney   Account Number:  [id number]  Date Initiated:  10/23/2012  Documentation initiated by:  Sharrie Rothman  Subjective/Objective Assessment:   Pt admitted from home with abd pain and SBO. Pt lives with her husband and will return home at discharge. Pt is independent with ADL's.     Action/Plan:   Will continue to monitor for [facility] needs.   Anticipated DC Date:  10/30/2012   Anticipated DC Plan:  HOME/SELF CARE      DC Planning Services  CM consult      Choice offered to / List presented to:             Status of service:  Completed, signed off Medicare Important Message given?   (If response is "NO", the following Medicare IM given date fields will be blank) Date Medicare IM given:   Date Additional Medicare IM given:    Discharge Disposition:  HOME/SELF CARE  Per UR Regulation:    If discussed at Long Length of Stay Meetings, dates discussed:    Comments:  10/24/12 0820 Arlyss Queen, RN BSN CM Pt discharged home today. No CM needs noted.  10/23/12 1100 Arlyss Queen, RN BSN CM

## 2012-10-23 NOTE — Op Note (Signed)
[facility] [address] [place], [address]   ENDOSCOPY PROCEDURE REPORT  PATIENT: Jordan, Haney  MR#: [id number] BIRTHDATE: 07-Dec-1955 , 57  yrs. old GENDER: Female ENDOSCOPIST: R.  Roetta Sessions, MD FACP FACG REFERRED BY:  Franky Macho, M.D. PROCEDURE DATE:  10/23/2012 PROCEDURE:     Diagnostic EGD  INDICATIONS:     history of peptic ulcer disease. Nausea and vomiting  INFORMED CONSENT:   The risks, benefits, limitations, alternatives and imponderables have been discussed.  The potential for biopsy, esophogeal dilation, etc. have also been reviewed.  Questions have been answered.  All parties agreeable.  Please see the history and physical in the medical record for more information.  MEDICATIONS:  Versed 5 mg IV and Demerol 100 mg IV in divided doses. Cetacaine spray. Zofran 4 mg  DESCRIPTION OF PROCEDURE:   The EG-[id number] ([id number])  endoscope was introduced through the mouth and advanced to the second portion of the duodenum without difficulty or limitations.  The mucosal surfaces were surveyed very carefully during advancement of the scope and upon withdrawal.  Retroflexion view of the proximal stomach and esophagogastric junction was performed.      FINDINGS: Couple of small distal esophageal erosions. Stomach empty. Surgically altered stomach with a single effernet limb found. Area of scar along gastric musosa at the anastomosis consistent with site of prior gastric ulcer.  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 Previously noted gastric ulcer completely healed.  RECOMMENDATIONS:  Advance to a clear liquid diet. Continue proton pump inhibitor therapy. Perhaps 2 more weeks of Carafate suspension.  Patient to continue to absolutely refrain from taking nonsteroidal agents in the  future.    _______________________________ R. Roetta Sessions, MD FACP [facility] eSigned:  R. Roetta Sessions, MD FACP [facility] 10/23/2012 3:51 PM     CC:

## 2012-10-24 MED ORDER — PANTOPRAZOLE SODIUM 40 MG PO TBEC: 40.0000 mg | DELAYED_RELEASE_TABLET | Freq: Every day | ORAL | Status: DC

## 2012-10-24 MED ORDER — SUCRALFATE 1 GM/10ML PO SUSP: 1.0000 g | Freq: Three times a day (TID) | ORAL | Status: DC

## 2012-10-24 MED ORDER — FENTANYL 50 MCG/HR TD PT72
1.0000 | MEDICATED_PATCH | TRANSDERMAL | Status: DC
Start: 2012-10-24 — End: 2015-04-04

## 2012-10-24 NOTE — Discharge Summary (Signed)
Physician Discharge Summary  Patient ID: Jordan Haney MRN: [id number] DOB/AGE: 1955-11-02 57 y.o.  Admit date: 10/20/2012 Discharge date: 10/24/2012  Admission Diagnoses: Small bowel obstruction, history of chronic pancreatitis, history of chronic abdominal pain, anastomotic ulcer  Discharge Diagnoses: Ileus secondary to narcotic use, chronic abdominal pain, healed anastomotic gastric ulcer Active Problems:   * No active hospital problems. *   Discharged Condition: good  Hospital Course: Patient is a 57 year old white female who has had multiple admissions in the past who was transferred from [facility] with a possible small bowel obstruction. A nasogastric tube was placed, which quickly resolved any abdominal distention. Repeat KUB the following day revealed resolution of the abdominal distention. She continued to have epigastric pain, which she has had multiple times in the past. She does have a history of chronic pancreatitis, though she had no elevation of her amylase or lipase. Dr. Jena Gauss of gastroenterology was consulted and the patient underwent a followup EGD on 10/23/2012 for history of a gastric ulcer at the anastomosis of a gastrojejunostomy. Healing of the ulcer was noted. The patient's diet was advanced without difficulty. She was switched to a fentanyl patch in hopes of decreasing her oxycodone use. She has had a bowel movement since her admission. She is being discharged home in good improving condition.  Consults: GI  Discharge Exam: Blood pressure 118/75, pulse 64, temperature 98.1 F (36.7 C), temperature source Oral, resp. rate 18, height 5\' 4"  (1.626 m), weight 48.081 kg (106 lb), SpO2 96.00%. General appearance: alert, cooperative and no distress Resp: clear to auscultation bilaterally Cardio: regular rate and rhythm, S1, S2 normal, no murmur, click, rub or gallop GI: soft, non-tender; bowel sounds normal; no masses,  no organomegaly  Disposition:  01-Home or Self Care     Medication List         acetaminophen 325 MG tablet  Commonly known as:  TYLENOL  Take 650 mg by mouth every 6 (six) hours as needed for pain.     ALPRAZolam 0.5 MG tablet  Commonly known as:  XANAX  Take 0.5 mg by mouth 2 (two) times daily.     fentaNYL 50 MCG/HR  Commonly known as:  DURAGESIC - dosed mcg/hr  Place 1 patch (50 mcg total) onto the skin every 3 (three) days.     lipase/protease/amylase 40981 UNITS Cpep capsule  Commonly known as:  CREON-12/PANCREASE  Take 3 capsules by mouth 3 (three) times daily before meals.     ondansetron 4 MG tablet  Commonly known as:  ZOFRAN  Take 1 tablet (4 mg total) by mouth every 6 (six) hours as needed for nausea.     oxyCODONE 5 MG immediate release tablet  Commonly known as:  ROXICODONE  Take 2 tablets (10 mg total) by mouth every 6 (six) hours as needed for pain.     POTASSIUM PO  Take 1 tablet by mouth daily.     sucralfate 1 GM/10ML suspension  Commonly known as:  CARAFATE  Take 10 mLs (1 g total) by mouth 4 (four) times daily -  with meals and at bedtime.     zolpidem 5 MG tablet  Commonly known as:  AMBIEN  Take 5 mg by mouth at bedtime as needed for sleep.           Follow-up Information   Follow up with Dalia Heading, MD. (As needed)    Contact information:   [address] [place] [address] [phone]  SignedFranky Macho A 10/24/2012, 8:00 AM

## 2012-10-24 NOTE — Progress Notes (Signed)
Patient received discharge instructions along with follow up appointments and prescriptions. Patient verbalized understanding of all instructions. Patient was escorted by staff to vehicle. Patient discharged to home in stable condition. 

## 2012-10-25 ENCOUNTER — Encounter (HOSPITAL_COMMUNITY): Payer: Self-pay | Admitting: Internal Medicine

## 2012-12-19 ENCOUNTER — Encounter (INDEPENDENT_AMBULATORY_CARE_PROVIDER_SITE_OTHER): Payer: Self-pay

## 2014-12-18 ENCOUNTER — Telehealth: Payer: Self-pay | Admitting: Gastroenterology

## 2014-12-18 NOTE — Telephone Encounter (Signed)
I called pt. She said [name] ( Rheumatologist in [employer]) wants to put pt on Methotrexate for RA. She said she has not had any problems with ulcers for a year or so. Please advise.

## 2014-12-18 NOTE — Telephone Encounter (Signed)
[phone]  PLEASE CALL PT, HER REUMATOLOGIST WILL NOT PUT HER BACK ON A CERTAIN MEDICATION UNTIL [facility] RELEASES HER TO BE ABLE TO TAKE IT.  SHE HAS NOT HAD ANY PROBLEMS WITH ULCERS

## 2014-12-20 NOTE — Telephone Encounter (Signed)
PLEASE CALL PT.[name] ( Rheumatologist in [place]). Her LAST EGD IN 2014 SHOWED NO ULCERS. SHE SHE SHOULD BE ABLE TO TAKE METHOTREXATE. THEN PLEASE CALL PT & LET HER KNOW SHE IS CLEAR O TAKE MTX.Jordan Haney

## 2014-12-20 NOTE — Telephone Encounter (Signed)
Pt is aware.  

## 2014-12-23 NOTE — Telephone Encounter (Signed)
REVIEWED.  

## 2014-12-23 NOTE — Telephone Encounter (Signed)
Pt called and said the Rheumatologist would not take her word about the Methotrexate. I told her I will fax the note to them. Fax # [phone].

## 2014-12-24 NOTE — Telephone Encounter (Signed)
Tried to fax the note twice and it did not go through. I called and informed pt. She will check it out and see if there is another fax number to use and let me know.

## 2014-12-25 NOTE — Telephone Encounter (Signed)
LATE ENTRY: pt called back yesterday and left fax number of [phone] and I faxed it.

## 2015-01-21 HISTORY — PX: TOTAL HIP ARTHROPLASTY: SHX124

## 2015-03-23 HISTORY — PX: KYPHOPLASTY: SHX5884

## 2015-04-04 ENCOUNTER — Encounter (HOSPITAL_COMMUNITY): Payer: Self-pay | Admitting: *Deleted

## 2015-04-04 ENCOUNTER — Emergency Department (HOSPITAL_COMMUNITY)

## 2015-04-04 ENCOUNTER — Emergency Department (HOSPITAL_COMMUNITY)
Admission: EM | Admit: 2015-04-04 | Discharge: 2015-04-04 | Disposition: A | Discharge status: Home / Self Care | Attending: Emergency Medicine | Admitting: Emergency Medicine

## 2015-04-04 DIAGNOSIS — Z862 Personal history of diseases of the blood and blood-forming organs and certain disorders involving the immune mechanism: Secondary | ICD-10-CM | POA: Insufficient documentation

## 2015-04-04 DIAGNOSIS — F419 Anxiety disorder, unspecified: Secondary | ICD-10-CM | POA: Diagnosis not present

## 2015-04-04 DIAGNOSIS — Z79899 Other long term (current) drug therapy: Secondary | ICD-10-CM | POA: Insufficient documentation

## 2015-04-04 DIAGNOSIS — E876 Hypokalemia: Secondary | ICD-10-CM | POA: Insufficient documentation

## 2015-04-04 DIAGNOSIS — Z9889 Other specified postprocedural states: Secondary | ICD-10-CM | POA: Insufficient documentation

## 2015-04-04 DIAGNOSIS — Z87442 Personal history of urinary calculi: Secondary | ICD-10-CM | POA: Diagnosis not present

## 2015-04-04 DIAGNOSIS — Z8719 Personal history of other diseases of the digestive system: Secondary | ICD-10-CM | POA: Insufficient documentation

## 2015-04-04 DIAGNOSIS — G8929 Other chronic pain: Secondary | ICD-10-CM | POA: Insufficient documentation

## 2015-04-04 DIAGNOSIS — Y9389 Activity, other specified: Secondary | ICD-10-CM | POA: Diagnosis not present

## 2015-04-04 DIAGNOSIS — Y998 Other external cause status: Secondary | ICD-10-CM | POA: Diagnosis not present

## 2015-04-04 DIAGNOSIS — W010XXA Fall on same level from slipping, tripping and stumbling without subsequent striking against object, initial encounter: Secondary | ICD-10-CM | POA: Insufficient documentation

## 2015-04-04 DIAGNOSIS — Z8701 Personal history of pneumonia (recurrent): Secondary | ICD-10-CM | POA: Insufficient documentation

## 2015-04-04 DIAGNOSIS — M069 Rheumatoid arthritis, unspecified: Secondary | ICD-10-CM | POA: Diagnosis not present

## 2015-04-04 DIAGNOSIS — Y9289 Other specified places as the place of occurrence of the external cause: Secondary | ICD-10-CM | POA: Diagnosis not present

## 2015-04-04 DIAGNOSIS — S3992XA Unspecified injury of lower back, initial encounter: Secondary | ICD-10-CM | POA: Diagnosis present

## 2015-04-04 DIAGNOSIS — M810 Age-related osteoporosis without current pathological fracture: Secondary | ICD-10-CM | POA: Insufficient documentation

## 2015-04-04 DIAGNOSIS — S32050A Wedge compression fracture of fifth lumbar vertebra, initial encounter for closed fracture: Secondary | ICD-10-CM | POA: Diagnosis not present

## 2015-04-04 DIAGNOSIS — S32000A Wedge compression fracture of unspecified lumbar vertebra, initial encounter for closed fracture: Secondary | ICD-10-CM

## 2015-04-04 MED ORDER — HYDROMORPHONE HCL 1 MG/ML IJ SOLN
1.0000 mg | Freq: Once | INTRAMUSCULAR | Status: AC
  Administered 2015-04-04: 1 mg via INTRAMUSCULAR
  Filled 2015-04-04: qty 1

## 2015-04-04 MED ORDER — OXYCODONE-ACETAMINOPHEN 5-325 MG PO TABS: 1.0000 | ORAL_TABLET | ORAL | Status: DC | PRN

## 2015-04-04 MED ORDER — HYDROMORPHONE HCL 1 MG/ML IJ SOLN
1.0000 mg | Freq: Once | INTRAMUSCULAR | Status: AC
  Administered 2015-04-04: 1 mg via INTRAMUSCULAR

## 2015-04-04 MED ORDER — HYDROMORPHONE HCL 1 MG/ML IJ SOLN
INTRAMUSCULAR | Status: AC
  Administered 2015-04-04: 1 mg via INTRAMUSCULAR
  Filled 2015-04-04: qty 1

## 2015-04-04 MED ORDER — LIDOCAINE 5 % EX PTCH: 1.0000 | MEDICATED_PATCH | CUTANEOUS | Status: DC

## 2015-04-04 MED ORDER — OXYCODONE-ACETAMINOPHEN 5-325 MG PO TABS
2.0000 | ORAL_TABLET | Freq: Once | ORAL | Status: AC
  Administered 2015-04-04: 2 via ORAL
  Filled 2015-04-04: qty 2

## 2015-04-04 MED ORDER — ONDANSETRON 8 MG PO TBDP
8.0000 mg | ORAL_TABLET | Freq: Once | ORAL | Status: AC
  Administered 2015-04-04: 8 mg via ORAL
  Filled 2015-04-04: qty 1

## 2015-04-04 MED ORDER — OXYCODONE-ACETAMINOPHEN 5-325 MG PO TABS: 2.0000 | ORAL_TABLET | ORAL | Status: DC | PRN

## 2015-04-04 NOTE — ED Notes (Signed)
Pt states she fell a week ago. Pain to lower back.

## 2015-04-04 NOTE — ED Notes (Signed)
EDPA in room speaking with pt.

## 2015-04-04 NOTE — Discharge Instructions (Signed)
Lumbar Fracture °A lumbar fracture is a break in one of the bones of the lower back. Lumbar fractures range in severity. Severe fractures can damage the spinal cord. °CAUSES °This condition may be caused by: °· A fall (common). °· A car accident (common). °· A gunshot wound. °· A hard, direct hit to the back. °· Osteoporosis. °SYMPTOMS °The main symptom of this condition is severe pain in the lower back. If a fracture is complex or severe, there may also be: °· A misshapen or swollen area on the lower back. °· A limited ability to move an area of the lower back. °· An inability to empty the bladder or bowel. °· A loss of strength or sensation in the legs, feet, and toes. °· Paralysis. °DIAGNOSIS °This condition is diagnosed based on: °· A physical exam. °· Symptoms and what happened just before they developed. °· The results of imaging tests, such as an X-ray, CT scan, or MRI. °If your nerves have been damaged, you may also have other tests to find out how much damage there is. °TREATMENT °Treatment for this condition depends on the specifics of the injury. Most fractures can be treated with: °· A back brace. °· Bed rest and activity restrictions. °· Pain medicine. °· Physical therapy. °Fractures that are complex, involve multiple bones, or make the spine unstable may require surgery to remove pressure from the nerves or spinal cord and to stabilize the broken pieces of bone. °During recovery, it is normal to have pain and stiffness in the back for weeks. °HOME CARE INSTRUCTIONS °Medicines °· Take medicines only as directed by your health care provider. °· Do not drive or operate heavy machinery while taking pain medicine. ° Activity °· Stay in bed for as long as directed by your health care provider. °· If you were shown how to do any exercises to improve motion and strength in your back, do them as directed by your health care provider. °· Return to your normal activities as directed by your health care provider.  Ask your health care provider what activities are safe for you. °General Instructions °· If you were given a neck brace or back brace, wear it as directed by your health care provider. °· Keep all follow-up visits as directed by your health care provider. This is important. Failure to follow-up as recommended could result in permanent injury, disability, and long-lasting (chronic) pain. °SEEK MEDICAL CARE IF: °· Your pain does not improve over time. °· You have a persistent cough. °· You cannot return to your normal activities as planned or expected. °SEEK IMMEDIATE MEDICAL CARE IF: °· You have severe pain or your pain suddenly gets worse. °· You are unable to move. °· You have numbness, tingling, weakness, or paralysis in any part of your body. °· You cannot control your bladder or bowel. °· You have difficulty breathing. °· You have a fever. °· You have pain in your chest or abdomen. °· You vomit. °  °This information is not intended to replace advice given to you by your health care provider. Make sure you discuss any questions you have with your health care provider. °  °Document Released: 06/23/2006 Document Revised: 07/23/2014 Document Reviewed: 03/04/2014 °Elsevier Interactive Patient Education ©2016 [employer] ° °

## 2015-04-06 ENCOUNTER — Emergency Department (HOSPITAL_COMMUNITY)
Admission: EM | Admit: 2015-04-06 | Discharge: 2015-04-07 | Disposition: A | Discharge status: Other Acute Inpatient Hospital | Attending: Emergency Medicine | Admitting: Emergency Medicine

## 2015-04-06 ENCOUNTER — Encounter (HOSPITAL_COMMUNITY): Payer: Self-pay | Admitting: Emergency Medicine

## 2015-04-06 ENCOUNTER — Emergency Department (HOSPITAL_COMMUNITY)

## 2015-04-06 DIAGNOSIS — M4846XA Fatigue fracture of vertebra, lumbar region, initial encounter for fracture: Secondary | ICD-10-CM | POA: Insufficient documentation

## 2015-04-06 DIAGNOSIS — S32001A Stable burst fracture of unspecified lumbar vertebra, initial encounter for closed fracture: Secondary | ICD-10-CM

## 2015-04-06 DIAGNOSIS — M549 Dorsalgia, unspecified: Secondary | ICD-10-CM | POA: Insufficient documentation

## 2015-04-06 DIAGNOSIS — R339 Retention of urine, unspecified: Secondary | ICD-10-CM

## 2015-04-06 DIAGNOSIS — M545 Low back pain, unspecified: Secondary | ICD-10-CM

## 2015-04-06 HISTORY — DX: Fatigue fracture of vertebra, lumbar region, initial encounter for fracture: M48.46XA

## 2015-04-06 HISTORY — DX: Anxiety disorder, unspecified: F41.9

## 2015-04-06 LAB — CBC
HCT: 38.6 % (ref 36.0–46.0)
Hemoglobin: 11.9 g/dL — ABNORMAL LOW (ref 12.0–15.0)
MCH: 25 pg — AB (ref 26.0–34.0)
MCHC: 30.8 g/dL (ref 30.0–36.0)
MCV: 81.1 fL (ref 78.0–100.0)
PLATELETS: 247 10*3/uL (ref 150–400)
RBC: 4.76 MIL/uL (ref 3.87–5.11)
RDW: 14.9 % (ref 11.5–15.5)
WBC: 6.5 10*3/uL (ref 4.0–10.5)

## 2015-04-06 LAB — URINALYSIS, ROUTINE W REFLEX MICROSCOPIC
BILIRUBIN URINE: NEGATIVE
Glucose, UA: NEGATIVE mg/dL
HGB URINE DIPSTICK: NEGATIVE
KETONES UR: NEGATIVE mg/dL
Leukocytes, UA: NEGATIVE
NITRITE: NEGATIVE
Protein, ur: NEGATIVE mg/dL
Specific Gravity, Urine: 1.009 (ref 1.005–1.030)
pH: 6.5 (ref 5.0–8.0)

## 2015-04-06 LAB — BASIC METABOLIC PANEL
ANION GAP: 10 (ref 5–15)
BUN: 8 mg/dL (ref 6–20)
CALCIUM: 9.4 mg/dL (ref 8.9–10.3)
CO2: 29 mmol/L (ref 22–32)
Chloride: 100 mmol/L — ABNORMAL LOW (ref 101–111)
Creatinine, Ser: 0.61 mg/dL (ref 0.44–1.00)
GFR calc Af Amer: >60 mL/min (ref >60)
GLUCOSE: 93 mg/dL (ref 65–99)
POTASSIUM: 3.4 mmol/L — AB (ref 3.5–5.1)
SODIUM: 139 mmol/L (ref 135–145)

## 2015-04-06 MED ORDER — HYDROMORPHONE HCL 1 MG/ML IJ SOLN
1.0000 mg | Freq: Once | INTRAMUSCULAR | Status: AC
  Administered 2015-04-06: 1 mg via INTRAVENOUS
  Filled 2015-04-06: qty 1

## 2015-04-06 MED ORDER — LORAZEPAM 2 MG/ML IJ SOLN
1.0000 mg | Freq: Once | INTRAMUSCULAR | Status: AC
  Administered 2015-04-06: 1 mg via INTRAVENOUS
  Filled 2015-04-06 (×2): qty 1

## 2015-04-06 MED ORDER — HALOPERIDOL LACTATE 5 MG/ML IJ SOLN
5.0000 mg | Freq: Once | INTRAMUSCULAR | Status: AC
  Administered 2015-04-06: 5 mg via INTRAVENOUS
  Filled 2015-04-06: qty 1

## 2015-04-06 MED ORDER — HYDROCODONE-ACETAMINOPHEN 5-325 MG PO TABS
2.0000 | ORAL_TABLET | Freq: Once | ORAL | Status: AC
  Administered 2015-04-06: 2 via ORAL
  Filled 2015-04-06: qty 2

## 2015-04-06 MED ORDER — LORAZEPAM 2 MG/ML IJ SOLN
1.0000 mg | Freq: Once | INTRAMUSCULAR | Status: AC
  Administered 2015-04-06: 1 mg via INTRAVENOUS
  Filled 2015-04-06: qty 1

## 2015-04-06 MED ORDER — CYCLOBENZAPRINE HCL 10 MG PO TABS
10.0000 mg | ORAL_TABLET | Freq: Once | ORAL | Status: AC
  Administered 2015-04-06: 10 mg via ORAL
  Filled 2015-04-06: qty 1

## 2015-04-06 MED ORDER — SODIUM CHLORIDE 0.9 % IV SOLN
INTRAVENOUS | Status: DC
  Administered 2015-04-06: 19:00:00 via INTRAVENOUS

## 2015-04-06 MED ORDER — LORAZEPAM 2 MG/ML IJ SOLN
2.0000 mg | Freq: Once | INTRAMUSCULAR | Status: AC
  Administered 2015-04-06: 2 mg via INTRAVENOUS
  Filled 2015-04-06: qty 1

## 2015-04-06 NOTE — ED Notes (Signed)
MRI called this RN because patient was refusing to get into the scanner, stating she "feels too awake." MD made aware and ordered Haldol. This RN went over to MRI to administer the medication.

## 2015-04-06 NOTE — ED Provider Notes (Signed)
CSN: [id number]     Arrival date & time 04/06/15  1220 History   First MD Initiated Contact with Patient 04/06/15 1305     Chief Complaint  Patient presents with  . Back Pain     (Consider location/radiation/quality/duration/timing/severity/associated sxs/prior Treatment) HPI Comments: The pt is a 60 y/o female She was seen 2 days ago after having back pain for last week - was dx with L5 compression fracture with some retropulsed fragments - no spinal stenosis at that time.  After dx, she was given the follow-up information for interventional radiology with the neurosurgery group to do a kyphoplasty however over the last 24 hours she reports acute worsening of her pain, inability to walk secondary to pain and this morning has had some inability to urinate. She is unable to tell me why she is unable to urinate but states she was able to start the not to finish. There is no burning, no fevers, no vomiting. Her pain has also become severe and unremitting despite taking the pain medications that she was prescribed.  The patient has known severe osteoporosis, there was no inciting injury that caused this acute fracture.  Patient is a 60 y.o. female presenting with back pain. The history is provided by the patient.  Back Pain   Past Medical History  Diagnosis Date  . RA (rheumatoid arthritis) ([place])  2008  . Constipation 03/21/2011  . Hypokalemia 03/21/2011  . Fatty liver 03/21/2011  . Pancreatitis     states 3 years ago, very severe, ?biliary pancreatitis   . T12 compression fracture ([place]) 2011  . Duodenal ulcer     remote per patient. +BC powders, patient report negative EGD six months ago  . Dilated pancreatic duct ([place])     ?chronic pancreatitis, EUS pending (06/02/11)  . Kidney stones   . Chronic abdominal pain   . Pneumonia     as child  . Anemia   . Lumbar stress fracture    Past Surgical History  Procedure Laterality Date  . Cholecystectomy    . Knee surgery    .  Appendectomy    . Complete hysterectomy    . Ventral wall hernia    . Esophagogastroduodenoscopy  08/2010    Dr. Earley Brooke, Versed. small hh, mild prepylori gastritis. path unavailable  . Esophagogastroduodenoscopy  04/2010    Dr. Earley Brooke, Versed. small hh, mild distal esophagitis, antral gastritis and duodenitis. Bx mild chronic gastritis and no H.pylori  . Esophagogastroduodenoscopy  08/2009    Dr. Posey Pronto, Versed. Moderate gastritis, moderate duodenitis with nodularity in proximal duodenal bulb, bx chronic gastritis, no helicobacter, mild chronic duodenitis  . Esophagogastroduodenoscopy  02/2007    Dr. Earley Brooke, Versed. antral gastritis and duodenal ulcers. Bx mild chronic gastritis. No bx from duodenal ulcer available.  [name] dilation  04/06/2011    Procedure: SAVORY DILATION;  Surgeon: Dorothyann Peng, MD;  Location: [facility];  Service: Endoscopy;  Laterality: N/A;  16 mm  . Esophagogastroduodenoscopy  04/06/11    Dr. Oneida Alar: 1-2 cm hiatal hernia, mod gastritis, 44mmX3mm linear ulcer in duodenal bulb, stricture 1st part of duodenum, dilated to 12 mm  . Esophagogastroduodenoscopy  05/12/11    partial gastric oulet obstruction secondary to stricture between bulb/2nd portion of duodenum with marked friability and inflammation but no discrete ulcer, dilated stomach. s/p dilation but high risk for restenosis  . Esophagogastroduodenoscopy  06/22/2011    [id number] ring/Moderate gastritis/PERSISTENT Ulcer in the bulb/descending duodenum Stricture in the bulb/descending duodenum  . Gastrojejunostomy  06/28/2011    Procedure: GASTROJEJUNOSTOMY;  Surgeon: Donato Heinz, MD;  Location: [facility];  Service: General;  Laterality: N/A;  Roux-en-y, Choledocalduodenostomy  . Abdominal hysterectomy    . Kidney stone surgery    . Esophagogastroduodenoscopy Left 06/25/2012    RMR: Normal esophagus. Surgically altered anastomosis to  small intestine noted. Couple of  at the anastomosis.  Patent efferent limb Patient had an  oozing 1 cm anastomotic ulcer  . Back surgery  2013  . Colonoscopy N/A 08/16/2012    Procedure: COLONOSCOPY;  Surgeon: Daneil Dolin, MD;  Location: [facility];  Service: Endoscopy;  Laterality: N/A;  Allergic to Phenergan. Last procedure performed with Versed, Demerol, and Zofran. Give Zofran 4 mg IV on call  . Cardiac catheterization      2008  . Esophagogastroduodenoscopy Left 10/23/2012    Procedure: ESOPHAGOGASTRODUODENOSCOPY (EGD);  Surgeon: Daneil Dolin, MD;  Location: [facility];  Service: Endoscopy;  Laterality: Left;   Family History  Problem Relation Age of Onset  . Colon cancer Neg Hx   . Liver cancer Neg Hx   . Breast cancer Neg Hx   . Inflammatory bowel disease Neg Hx   . Heart attack Mother    Social History  Substance Use Topics  . Smoking status: Never Smoker   . Smokeless tobacco: Never Used  . Alcohol Use: No   OB History    No data available     Review of Systems  Musculoskeletal: Positive for back pain.  All other systems reviewed and are negative.     Allergies  Compazine; Phenergan; Vistaril; Benadryl; Gabapentin; and Latex  Home Medications   Prior to Admission medications   Medication Sig Start Date End Date Taking? Authorizing Provider  ALPRAZolam Duanne Moron) 0.5 MG tablet Take 0.5 mg by mouth 2 (two) times daily.   Yes Historical Provider, MD  calcium gluconate 500 MG tablet Take 1 tablet by mouth daily.   Yes Historical Provider, MD  ketorolac (TORADOL) 10 MG tablet Take 10 mg by mouth every 6 (six) hours as needed for moderate pain or severe pain.   Yes Historical Provider, MD  lidocaine (LIDODERM) 5 % Place 1 patch onto the skin daily. Remove & Discard patch within 12 hours or as directed by MD 04/04/15  Yes Evalee Jefferson, PA-C  methotrexate (50 MG/ML) 1 g injection Inject into the vein once a week. Takes on Mondays.   Yes Historical Provider, MD  oxyCODONE-acetaminophen (PERCOCET/ROXICET) 5-325 MG tablet Take 2 tablets by mouth every 4 (four)  hours as needed for severe pain. 04/04/15  Yes Almyra Free Idol, PA-C  PARoxetine (PAXIL) 40 MG tablet Take 40 mg by mouth every morning.   Yes Historical Provider, MD  Vitamin D, Ergocalciferol, (DRISDOL) 50000 units CAPS capsule Take 50,000 Units by mouth 2 (two) times a week.   Yes Historical Provider, MD  zolpidem (AMBIEN) 5 MG tablet Take 5 mg by mouth at bedtime.    Yes Historical Provider, MD   BP 151/114 mmHg  Pulse 124  Temp(Src) 98.6 F (37 C) (Temporal)  Resp 18  Ht 5\' 3"  (1.6 m)  Wt 105 lb (47.628 kg)  BMI 18.60 kg/m2  SpO2 100% Physical Exam  Constitutional: She appears well-developed and well-nourished. No distress.  HENT:  Head: Normocephalic and atraumatic.  Mouth/Throat: Oropharynx is clear and moist. No oropharyngeal exudate.  Eyes: Conjunctivae and EOM are normal. Pupils are equal, round, and reactive to light. Right eye exhibits no discharge. Left eye  exhibits no discharge. No scleral icterus.  Neck: Normal range of motion. Neck supple. No JVD present. No thyromegaly present.  Cardiovascular: Regular rhythm, normal heart sounds and intact distal pulses.  Exam reveals no gallop and no friction rub.   No murmur heard. Uncomfortable appearing  Pulmonary/Chest: Effort normal and breath sounds normal. No respiratory distress. She has no wheezes. She has no rales.  Abdominal: Soft. Bowel sounds are normal. She exhibits no distension and no mass. There is no tenderness.  No abdominal mass or tenderness  Musculoskeletal: Normal range of motion. She exhibits tenderness ( Tenderness over the lower back or midline). She exhibits no edema.  Lymphadenopathy:    She has no cervical adenopathy.  Neurological: She is alert. Coordination normal.  In the supine position the patient is able to lift both legs off the bed with normal strength, she has normal sensation of bilateral lower extremities to light touch and pinprick.  normal reflexes at the patellar tendons bilaterally  Skin: Skin  is warm and dry. No rash noted. No erythema.  Psychiatric: She has a normal mood and affect. Her behavior is normal.  Nursing note and vitals reviewed.   ED Course  Procedures (including critical care time) Labs Review Labs Reviewed  CBC - Abnormal; Notable for the following:    Hemoglobin 11.9 (*)    MCH 25.0 (*)    All other components within normal limits  BASIC METABOLIC PANEL - Abnormal; Notable for the following:    Potassium 3.4 (*)    Chloride 100 (*)    All other components within normal limits  URINALYSIS, ROUTINE W REFLEX MICROSCOPIC (NOT AT [facility])    Imaging Review Dg Lumbar Spine Complete  04/04/2015  CLINICAL DATA:  Fall 1 week ago. Recent left hip fracture post surgical repair. Previous thoracic spine kyphoplasty. Low back pain into both hips. EXAM: LUMBAR SPINE - COMPLETE 4+ VIEW COMPARISON:  CT 08/18/2012 and KUB 10/22/2012 FINDINGS: Vertebral body alignment is within normal. There is mild spondylosis throughout the thoracolumbar spine to include facet arthropathy over the lower lumbar spine. Mild multilevel disc space narrowing throughout the lumbar spine. No change in a moderate T12 compression fracture. Mild compression deformity involving the superior endplate of L3 new since the previous exam. Mild compression fracture of L5 new since the previous exam. Subtle loss of height of L4 new since the prior exam. Multiple surgical sutures over the mid abdomen bilaterally. IMPRESSION: Superior endplate compression fracture L3 with mild L5 compression fracture and loss of height of L4 age indeterminate, but new since the previous exam from 08/18/2012. CT may be helpful for further evaluation. Mild spondylosis of the thoracolumbar spine. Stable moderate T12 compression fracture. Electronically Signed   By: Marin Olp M.D.   On: 04/04/2015 16:11   Ct Lumbar Spine Wo Contrast  04/04/2015  CLINICAL DATA:  Possible lumbar compression fractures after fall 1 week ago. EXAM: CT LUMBAR  SPINE WITHOUT CONTRAST TECHNIQUE: Multidetector CT imaging of the lumbar spine was performed without intravenous contrast administration. Multiplanar CT image reconstructions were also generated. COMPARISON:  CT scan of Aug 18, 2012. FINDINGS: Stable severe compression deformity of T12 vertebral body is noted consistent with old fracture. There is noted mild superior endplate depression of L3 and L4 vertebral bodies which appears to be chronic. Moderate to severe compression fracture is seen involving L5 vertebral body which appears to be acute. Mild posterior displacement of fracture fragment into spinal canal is noted, but does not result in significant  spinal canal stenosis. No significant soft tissue abnormality is noted within the spinal canal. IMPRESSION: Mild superior endplate depression of L3 and L4 vertebral bodies is noted which appears to represent chronic fractures. There appears to be acute moderate to severe compression fracture involving L5 vertebral body, with mild posterior displacement of fracture fragment into spinal canal that does not result in significant spinal canal stenosis. Electronically Signed   By: Marijo Conception, M.D.   On: 04/04/2015 19:34   Ct Pelvis Wo Contrast  04/04/2015  CLINICAL DATA:  Fall 1 week ago with compression fractures. Complains of left hip pain. EXAM: CT PELVIS WITHOUT CONTRAST TECHNIQUE: Multidetector CT imaging of the pelvis was performed following the standard protocol without intravenous contrast. COMPARISON:  CT 08/18/2012 as well as recent lumbar spine plain films 04/04/2015 FINDINGS: Examination demonstrates today moderate L5 compression fracture with distinct fracture lines and cortical step-off suggesting an acute injury. No fragments within the spinal canal. Minimal retropulsion of the inferior aspect of the posterior border of the L5 fracture without significant canal stenosis. Mild bilateral neural foramina narrowing at the L5-S1 level right greater  than left. Broad-based disc bulge at the L5-S1 level. Also broad-based disc bulge at the L4-5 level. No additional fractures visualized. Mild degenerate change over the sacroiliac joints and right hip. Left hip arthroplasty intact. There is a surgical suture line over a bowel loop in the left lower quadrant. Remainder the exam is unremarkable. IMPRESSION: Acute moderate L5 compression fracture with subtle retropulsion of the inferior aspect of the posterior border of the compression fracture. No free fragments within the canal. Left hip arthroplasty intact. Mild degenerate change of the right hip. Disc disease at the L4-5 and L5-S1 levels. Electronically Signed   By: Marin Olp M.D.   On: 04/04/2015 19:39   I have personally reviewed and evaluated these images and lab results as part of my medical decision-making.    MDM   Final diagnoses:  None    The patient appears very uncomfortable, she will need pain medication, post void residual, basic labs in case she needs to be admitted, would consider neuro imaging if she does have a post void residual concerning for spinal stenosis or cauda equina. Her neurologic exam otherwise does not suggest that. She has been able to ambulate as recently as last night when she went to the bathroom. This morning is when she had increased trouble which may be just be related to pain.  D/W Dr. Wilson Singer who accepts pt as ED to ED transfer to get MRI - pt has inabiliyt to urinate - 250CC of urine on bedside bladders scan.  Needs MRI.    Noemi Chapel, MD 04/06/15 [phone]

## 2015-04-06 NOTE — ED Notes (Signed)
Per [facility], administered Dilaudid 1mg  en route d/t pt c/o pain.

## 2015-04-06 NOTE — ED Notes (Addendum)
Patient moved to MRI table and still refusing to let the techs start the scan. MD to order more medication for patient.

## 2015-04-06 NOTE — ED Notes (Signed)
Dr Jacelyn Grip in w/pt. Aware pt attempting x 2 to void. States tried at [facility] x 15 min on bedside commode - unable to void. Residual was checked and was advised had 239ml.

## 2015-04-06 NOTE — ED Notes (Signed)
Pt expressed extreme concern about her MRI.  Shared info with her RN.  Pt also shared a copy of her latest Bone Density Scan.  This EMT made a copy and returned the original.

## 2015-04-06 NOTE — ED Provider Notes (Signed)
  Patient transferred from [facility]. Please see Dr. Sanjuan Dame note for prior history physical and care. Briefly this is a 60 year old female with a history of rheumatoid arthritis, chronic pancreatitis, Roux-en-Y marginal ulcer, chronic abdominal pain, osteoporosis with prior T12 compression fracture who presented on Friday to emergency department where she had x-ray and CT which showed an L5 burst fracture.  Patient presented to [facility] with concern of worsening back pain and inability to urinate. She was transferred to [facility], and for MRI.   On my evaluation, patient reports urinary retention, however denies any leg weakness or numbness and has normal strength and sensation on exam. States she has pain and this made it difficult to walk.  Patient had a bladder scan completed at [facility] which showed postvoid residual of 250 mL. Patient was unable to urinate while in the emergency department here and had bladder scan showing 610 mL and Foley catheter was placed.  MRI was completed which again showed the L5 burst fracture with 2 mm of retropulsion, acute L3 superior endplate fracture and old T12 fracture.  There is no canal stenosis, mild suspected L4-L5 and L5-S1 neural foraminal narrowing. Discussed with Dr. Arnoldo Morale of neurosurgery who evaluated the imaging and agree that minimal amount of retropulsion is not responsible for patient's urinary retention. Other etiologies of her urinary retention may include reaction or side effect to narcotic pain medications, psychogenic or other.  Given patient unable to urinate in the ED, will send her home with foley and instruct her to follow up with Urology for removal and voiding trial.  She was placed in a TLSO for comfort and support and given pain medications while in the ED.   Patient's pain is improved in the brace.  She reports percocet too strong and was given short duration of norco tablets until she is able to follow up with physician.   Patient  discharged to follow up with Urology for urinary retention with foley in place and Neurosurgery Dr. Arnoldo Morale.  (pt reports seeing Dr. Leafy Kindle prior who is currently on medical leave and requests NSU follow up here.)  Patient discharged in stable condition with understanding of reasons to return.        IMPRESSION: Motion degraded examination.  Acute moderate L5 burst fracture, and at least 2 mm retropulsed bony fragments, better characterized on prior CT. Acute mild to moderate L3 superior endplate burst fracture. Old T12 moderate to severe burst fracture. No malalignment.  Osteopenia.  No canal stenosis. Mild suspected L4-5 and L5-S1 neural foraminal narrowing.  Gareth Morgan, MD 04/07/15 [phone]

## 2015-04-06 NOTE — ED Notes (Addendum)
Pt noted w/682ml of urine per bladder scan. Insert f/c per Dr Billy Fischer.

## 2015-04-06 NOTE — ED Notes (Signed)
Pt states that her pain is no better.

## 2015-04-06 NOTE — ED Notes (Signed)
Administer Dilaudid and Ativan prior to MRI.

## 2015-04-06 NOTE — Progress Notes (Signed)
Orthopedic Tech Progress Note Patient Details:  Jordan Haney 1955/04/10 [id number] Contacted [employer] for TLSO. Patient ID: Jordan Haney, female   DOB: [birth year], 60 y.o.   MRN: [id number]   Darrol Poke 04/06/2015, 11:46 PM

## 2015-04-06 NOTE — ED Notes (Signed)
MRI Tech advised may be ready for pt at approx 1930-2000. Advised will call RN prior to when ready so meds can be given.

## 2015-04-06 NOTE — ED Notes (Addendum)
Patient c/o lower back pain in which she was seen here on Friday for and diagnosed with Lumbar fracture. Patient states she is supposed to call neurosurgeon tomorrow but pain has increased and she can no longer walk or have BM. CNS intact.

## 2015-04-06 NOTE — ED Notes (Signed)
Per Dr Billy Fischer - have pt to attempt to empty bladder, perform bladder scan for residual and place f/c if urine in bladder. Advised pt may need to possibly go home w/f/c.

## 2015-04-07 MED ORDER — HYDROCODONE-ACETAMINOPHEN 5-325 MG PO TABS: 1.0000 | ORAL_TABLET | Freq: Four times a day (QID) | ORAL | Status: DC | PRN

## 2015-04-07 NOTE — ED Notes (Signed)
Changed standard drainage bag to leg bag.

## 2015-04-07 NOTE — ED Notes (Signed)
[employer] placed patient in TLSO brace, patient tolerating well at this time.

## 2015-04-07 NOTE — ED Notes (Signed)
Demonstrated to patient's fiance how to strap on patient's back brace. Patient verbalized understanding of discharge instructions and denies any further needs or questions at this time. VS stable. Wheeled patient out to ED entrance and helped her into her family's truck.

## 2015-04-07 NOTE — ED Provider Notes (Signed)
CSN: [id number]     Arrival date & time 04/04/15  1404 History   First MD Initiated Contact with Patient 04/04/15 1606     Chief Complaint  Patient presents with  . Back Pain     (Consider location/radiation/quality/duration/timing/severity/associated sxs/prior Treatment) The history is provided by the patient.   ALLISEN HATLESTAD is a 60 y.o. female with a past medical history significant Rheumatoid arthritis, osteoporosis and prior vertebral compression fractures with h/o kyphoplasty at T12, presenting with acute on chronic low back and sacral pain since falling one week ago when she tripped, landing on her right side. Since the event she has had worsening low back and deep buttock pain despite resting and applying heat.  She is concerned also for possible injury to her hips as she is 2 months out from total Left hip replacement 11/16 (in [place]).  She has been able to ambulate although with severe, but non radiating pain and denies urinary or fecal retention or incontinence. She has not contacted her neurosurgeon in [place] as he is currently out on medical leave, nor has she spoke with her orthopedist. She takes methotrexate weekly and is not taking any pain medications currently.     Past Medical History  Diagnosis Date  . RA (rheumatoid arthritis) ([place])  2008  . Constipation 03/21/2011  . Hypokalemia 03/21/2011  . Fatty liver 03/21/2011  . Pancreatitis     states 3 years ago, very severe, ?biliary pancreatitis   . T12 compression fracture ([place]) 2011  . Duodenal ulcer     remote per patient. +BC powders, patient report negative EGD six months ago  . Dilated pancreatic duct ([place])     ?chronic pancreatitis, EUS pending (06/02/11)  . Kidney stones   . Chronic abdominal pain   . Pneumonia     as child  . Anemia   . Lumbar stress fracture   . Anxiety    Past Surgical History  Procedure Laterality Date  . Cholecystectomy    . Knee surgery    . Appendectomy    . Complete  hysterectomy    . Ventral wall hernia    . Esophagogastroduodenoscopy  08/2010    Dr. Earley Brooke, Versed. small hh, mild prepylori gastritis. path unavailable  . Esophagogastroduodenoscopy  04/2010    Dr. Earley Brooke, Versed. small hh, mild distal esophagitis, antral gastritis and duodenitis. Bx mild chronic gastritis and no H.pylori  . Esophagogastroduodenoscopy  08/2009    Dr. Posey Pronto, Versed. Moderate gastritis, moderate duodenitis with nodularity in proximal duodenal bulb, bx chronic gastritis, no helicobacter, mild chronic duodenitis  . Esophagogastroduodenoscopy  02/2007    Dr. Earley Brooke, Versed. antral gastritis and duodenal ulcers. Bx mild chronic gastritis. No bx from duodenal ulcer available.  [name] dilation  04/06/2011    Procedure: SAVORY DILATION;  Surgeon: Dorothyann Peng, MD;  Location: [facility];  Service: Endoscopy;  Laterality: N/A;  16 mm  . Esophagogastroduodenoscopy  04/06/11    Dr. Oneida Alar: 1-2 cm hiatal hernia, mod gastritis, 78mmX3mm linear ulcer in duodenal bulb, stricture 1st part of duodenum, dilated to 12 mm  . Esophagogastroduodenoscopy  05/12/11    partial gastric oulet obstruction secondary to stricture between bulb/2nd portion of duodenum with marked friability and inflammation but no discrete ulcer, dilated stomach. s/p dilation but high risk for restenosis  . Esophagogastroduodenoscopy  06/22/2011    [id number] ring/Moderate gastritis/PERSISTENT Ulcer in the bulb/descending duodenum Stricture in the bulb/descending duodenum  . Gastrojejunostomy  06/28/2011    Procedure: GASTROJEJUNOSTOMY;  Surgeon:  Donato Heinz, MD;  Location: [facility];  Service: General;  Laterality: N/A;  Roux-en-y, Choledocalduodenostomy  . Abdominal hysterectomy    . Kidney stone surgery    . Esophagogastroduodenoscopy Left 06/25/2012    RMR: Normal esophagus. Surgically altered anastomosis to  small intestine noted. Couple of  at the anastomosis.  Patent efferent limb Patient had an oozing 1 cm anastomotic ulcer   . Back surgery  2013  . Colonoscopy N/A 08/16/2012    Procedure: COLONOSCOPY;  Surgeon: Daneil Dolin, MD;  Location: [facility];  Service: Endoscopy;  Laterality: N/A;  Allergic to Phenergan. Last procedure performed with Versed, Demerol, and Zofran. Give Zofran 4 mg IV on call  . Cardiac catheterization      2008  . Esophagogastroduodenoscopy Left 10/23/2012    Procedure: ESOPHAGOGASTRODUODENOSCOPY (EGD);  Surgeon: Daneil Dolin, MD;  Location: [facility];  Service: Endoscopy;  Laterality: Left;  . Total hip arthroplasty Left 01/2015    in [place], [place]   Family History  Problem Relation Age of Onset  . Colon cancer Neg Hx   . Liver cancer Neg Hx   . Breast cancer Neg Hx   . Inflammatory bowel disease Neg Hx   . Heart attack Mother    Social History  Substance Use Topics  . Smoking status: Never Smoker   . Smokeless tobacco: Never Used  . Alcohol Use: No   OB History    No data available     Review of Systems  Constitutional: Negative for fever.  Respiratory: Negative for shortness of breath.   Cardiovascular: Negative for chest pain and leg swelling.  Gastrointestinal: Negative for abdominal pain, constipation and abdominal distention.  Genitourinary: Negative for dysuria, urgency, frequency, flank pain and difficulty urinating.  Musculoskeletal: Positive for back pain. Negative for joint swelling and gait problem.  Skin: Negative for rash.  Neurological: Negative for weakness and numbness.      Allergies  Compazine; Phenergan; Vistaril; Benadryl; Gabapentin; and Latex  Home Medications   Prior to Admission medications   Medication Sig Start Date End Date Taking? Authorizing Provider  ALPRAZolam Duanne Moron) 0.5 MG tablet Take 0.5 mg by mouth 2 (two) times daily.   Yes Historical Provider, MD  calcium gluconate 500 MG tablet Take 1 tablet by mouth daily.   Yes Historical Provider, MD  ketorolac (TORADOL) 10 MG tablet Take 10 mg by mouth every 6 (six) hours as  needed for moderate pain or severe pain.   Yes Historical Provider, MD  methotrexate (50 MG/ML) 1 g injection Inject into the vein once a week. Takes on Mondays.   Yes Historical Provider, MD  PARoxetine (PAXIL) 40 MG tablet Take 40 mg by mouth every morning.   Yes Historical Provider, MD  Vitamin D, Ergocalciferol, (DRISDOL) 50000 units CAPS capsule Take 50,000 Units by mouth 2 (two) times a week.   Yes Historical Provider, MD  zolpidem (AMBIEN) 5 MG tablet Take 5 mg by mouth at bedtime.    Yes Historical Provider, MD  HYDROcodone-acetaminophen (NORCO/VICODIN) 5-325 MG tablet Take 1-2 tablets by mouth every 6 (six) hours as needed for severe pain. 04/07/15   Gareth Morgan, MD  lidocaine (LIDODERM) 5 % Place 1 patch onto the skin daily. Remove & Discard patch within 12 hours or as directed by MD 04/04/15   Evalee Jefferson, PA-C   BP 162/102 mmHg  Pulse 73  Temp(Src) 98.2 F (36.8 C) (Oral)  Resp 16  Ht 5\' 3"  (1.6 m)  Wt  47.628 kg  BMI 18.60 kg/m2  SpO2 100% Physical Exam  Constitutional: She appears well-developed and well-nourished.  Appears uncomfortable.  Lying on her left side.  HENT:  Head: Normocephalic.  Eyes: Conjunctivae are normal.  Neck: Normal range of motion. Neck supple.  Cardiovascular: Normal rate and intact distal pulses.   Pedal pulses normal.  Pulmonary/Chest: Effort normal.  Abdominal: Soft. Bowel sounds are normal. She exhibits no distension and no mass. There is no tenderness.  Musculoskeletal: Normal range of motion. She exhibits no edema.       Lumbar back: She exhibits tenderness. She exhibits no swelling, no edema and no spasm.  ttp midline lower lumbar/sacral.  No palpable deformity or step offs, no contusion.   Neurological: She is alert. She has normal strength. She displays no atrophy and no tremor. No sensory deficit. Gait normal.  Reflex Scores:      Patellar reflexes are 2+ on the right side and 2+ on the left side. No strength deficit noted in hip and  knee flexor and extensor muscle groups.  Ankle flexion and extension intact.  Ambulatory, slow gait, walks with low back in forward flexion.  Skin: Skin is warm and dry.  Psychiatric: She has a normal mood and affect.  Nursing note and vitals reviewed.   ED Course  Procedures (including critical care time)  Imaging Review  Final result by Rad Results In Interface (04/04/15 19:34:09)   Narrative:   CLINICAL DATA: Possible lumbar compression fractures after fall 1 week ago.  EXAM: CT LUMBAR SPINE WITHOUT CONTRAST  TECHNIQUE: Multidetector CT imaging of the lumbar spine was performed without intravenous contrast administration. Multiplanar CT image reconstructions were also generated.  COMPARISON: CT scan of Aug 18, 2012.  FINDINGS: Stable severe compression deformity of T12 vertebral body is noted consistent with old fracture. There is noted mild superior endplate depression of L3 and L4 vertebral bodies which appears to be chronic. Moderate to severe compression fracture is seen involving L5 vertebral body which appears to be acute. Mild posterior displacement of fracture fragment into spinal canal is noted, but does not result in significant spinal canal stenosis. No significant soft tissue abnormality is noted within the spinal canal.  IMPRESSION: Mild superior endplate depression of L3 and L4 vertebral bodies is noted which appears to represent chronic fractures.  There appears to be acute moderate to severe compression fracture involving L5 vertebral body, with mild posterior displacement of fracture fragment into spinal canal that does not result in significant spinal canal stenosis.   Electronically Signed By: Marijo Conception, M.D. On: 04/04/2015 19:34          CT Pelvis Wo Contrast (Final result) Result time: 04/04/15 19:40:07   Final result by Rad Results In Interface (04/04/15 19:40:07)   Narrative:   CLINICAL DATA: Fall 1 week ago with  compression fractures. Complains of left hip pain.  EXAM: CT PELVIS WITHOUT CONTRAST  TECHNIQUE: Multidetector CT imaging of the pelvis was performed following the standard protocol without intravenous contrast.  COMPARISON: CT 08/18/2012 as well as recent lumbar spine plain films 04/04/2015  FINDINGS: Examination demonstrates today moderate L5 compression fracture with distinct fracture lines and cortical step-off suggesting an acute injury. No fragments within the spinal canal. Minimal retropulsion of the inferior aspect of the posterior border of the L5 fracture without significant canal stenosis. Mild bilateral neural foramina narrowing at the L5-S1 level right greater than left. Broad-based disc bulge at the L5-S1 level. Also broad-based disc bulge at the  L4-5 level. No additional fractures visualized. Mild degenerate change over the sacroiliac joints and right hip. Left hip arthroplasty intact.  There is a surgical suture line over a bowel loop in the left lower quadrant. Remainder the exam is unremarkable.  IMPRESSION: Acute moderate L5 compression fracture with subtle retropulsion of the inferior aspect of the posterior border of the compression fracture. No free fragments within the canal.  Left hip arthroplasty intact. Mild degenerate change of the right hip.  Disc disease at the L4-5 and L5-S1 levels.   Electronically Signed By: Marin Olp M.D. On: 04/04/2015 19:39          DG Lumbar Spine Complete (Final result) Result time: 04/04/15 16:11:11   Final result by Rad Results In Interface (04/04/15 16:11:11)   Narrative:   CLINICAL DATA: Fall 1 week ago. Recent left hip fracture post surgical repair. Previous thoracic spine kyphoplasty. Low back pain into both hips.  EXAM: LUMBAR SPINE - COMPLETE 4+ VIEW  COMPARISON: CT 08/18/2012 and KUB 10/22/2012  FINDINGS: Vertebral body alignment is within normal. There is mild  spondylosis throughout the thoracolumbar spine to include facet arthropathy over the lower lumbar spine. Mild multilevel disc space narrowing throughout the lumbar spine. No change in a moderate T12 compression fracture. Mild compression deformity involving the superior endplate of L3 new since the previous exam. Mild compression fracture of L5 new since the previous exam. Subtle loss of height of L4 new since the prior exam. Multiple surgical sutures over the mid abdomen bilaterally.  IMPRESSION: Superior endplate compression fracture L3 with mild L5 compression fracture and loss of height of L4 age indeterminate, but new since the previous exam from 08/18/2012. CT may be helpful for further evaluation.  Mild spondylosis of the thoracolumbar spine.  Stable moderate T12 compression fracture.   Electronically Signed By: Marin Olp M.D. On: 04/04/2015 16:11       I have personally reviewed and evaluated these images and lab results as part of my medical decision-making.    MDM   Final diagnoses:  Lumbar compression fracture, closed, initial encounter [facility])    Discussed findings with Dr. Jeneen Rinks who assisted with plan formulated.  Discussed with Dr. Joya Salm who reviewed imaging.  No indication for emergent action.  He does agree that she may be a candidate for further kyphoplasty, but procedure is not available emergently.  advised pt can call the office to arrange f/u or may consider having pt call Interventional Radiology directly (as he does not perform these procedures) for f/u care.  Suggested TLSO lumbar brace.  Attempted to arrange for brace, patient refused, states she has chronic pain of abdominal wall since prior surgeries and will not wear anything tight on her abdomen.   This was suggested for pain relief, not needed for immobilization purposes.  She was prescribed oxycodone, also given script for lidoderm patch which she has used in the past.    No neuro deficit  on exam or by history to suggest emergent or surgical presentation.  Discussed worsened sx that should prompt immediate re-evaluation including distal weakness, bowel/bladder retention/incontinence.          Evalee Jefferson, PA-C 04/07/15 [place], PA-C 04/07/15 [place], MD 04/15/15 [phone]

## 2015-04-08 MED FILL — Oxycodone w/ Acetaminophen Tab 5-325 MG: ORAL | Qty: 6 | Status: AC

## 2015-05-27 ENCOUNTER — Telehealth: Payer: Self-pay | Admitting: Gastroenterology

## 2015-05-27 ENCOUNTER — Inpatient Hospital Stay (HOSPITAL_COMMUNITY)
Admission: EM | Admit: 2015-05-27 | Discharge: 2015-06-02 | DRG: 392 | Disposition: A | Discharge status: Home / Self Care | Attending: Internal Medicine | Admitting: Internal Medicine

## 2015-05-27 ENCOUNTER — Encounter (HOSPITAL_COMMUNITY): Payer: Self-pay | Admitting: Emergency Medicine

## 2015-05-27 DIAGNOSIS — Z9049 Acquired absence of other specified parts of digestive tract: Secondary | ICD-10-CM

## 2015-05-27 DIAGNOSIS — K219 Gastro-esophageal reflux disease without esophagitis: Secondary | ICD-10-CM | POA: Diagnosis not present

## 2015-05-27 DIAGNOSIS — R109 Unspecified abdominal pain: Secondary | ICD-10-CM | POA: Diagnosis present

## 2015-05-27 DIAGNOSIS — Z903 Acquired absence of stomach [part of]: Secondary | ICD-10-CM | POA: Diagnosis not present

## 2015-05-27 DIAGNOSIS — R1013 Epigastric pain: Secondary | ICD-10-CM | POA: Diagnosis present

## 2015-05-27 DIAGNOSIS — Z87442 Personal history of urinary calculi: Secondary | ICD-10-CM | POA: Diagnosis not present

## 2015-05-27 DIAGNOSIS — Z8249 Family history of ischemic heart disease and other diseases of the circulatory system: Secondary | ICD-10-CM

## 2015-05-27 DIAGNOSIS — M069 Rheumatoid arthritis, unspecified: Secondary | ICD-10-CM | POA: Diagnosis not present

## 2015-05-27 DIAGNOSIS — Z8711 Personal history of peptic ulcer disease: Secondary | ICD-10-CM | POA: Diagnosis not present

## 2015-05-27 DIAGNOSIS — R339 Retention of urine, unspecified: Secondary | ICD-10-CM | POA: Diagnosis present

## 2015-05-27 DIAGNOSIS — Z96642 Presence of left artificial hip joint: Secondary | ICD-10-CM | POA: Diagnosis not present

## 2015-05-27 DIAGNOSIS — K5900 Constipation, unspecified: Secondary | ICD-10-CM | POA: Diagnosis not present

## 2015-05-27 DIAGNOSIS — F419 Anxiety disorder, unspecified: Secondary | ICD-10-CM | POA: Diagnosis present

## 2015-05-27 DIAGNOSIS — K760 Fatty (change of) liver, not elsewhere classified: Secondary | ICD-10-CM | POA: Diagnosis present

## 2015-05-27 DIAGNOSIS — Z90710 Acquired absence of both cervix and uterus: Secondary | ICD-10-CM

## 2015-05-27 DIAGNOSIS — R112 Nausea with vomiting, unspecified: Secondary | ICD-10-CM | POA: Diagnosis present

## 2015-05-27 DIAGNOSIS — R1110 Vomiting, unspecified: Secondary | ICD-10-CM | POA: Diagnosis not present

## 2015-05-27 DIAGNOSIS — K2970 Gastritis, unspecified, without bleeding: Secondary | ICD-10-CM | POA: Diagnosis present

## 2015-05-27 LAB — COMPREHENSIVE METABOLIC PANEL
ALBUMIN: 3.5 g/dL (ref 3.5–5.0)
ALT: 14 U/L (ref 14–54)
ANION GAP: 7 (ref 5–15)
AST: 23 U/L (ref 15–41)
Alkaline Phosphatase: 159 U/L — ABNORMAL HIGH (ref 38–126)
BILIRUBIN TOTAL: 0.2 mg/dL — AB (ref 0.3–1.2)
BUN: 8 mg/dL (ref 6–20)
CO2: 24 mmol/L (ref 22–32)
Calcium: 8.7 mg/dL — ABNORMAL LOW (ref 8.9–10.3)
Chloride: 110 mmol/L (ref 101–111)
Creatinine, Ser: 0.49 mg/dL (ref 0.44–1.00)
GFR calc non Af Amer: >60 mL/min (ref >60)
GLUCOSE: 89 mg/dL (ref 65–99)
POTASSIUM: 3.7 mmol/L (ref 3.5–5.1)
SODIUM: 141 mmol/L (ref 135–145)
TOTAL PROTEIN: 6.7 g/dL (ref 6.5–8.1)

## 2015-05-27 LAB — CBC
HEMATOCRIT: 34.5 % — AB (ref 36.0–46.0)
HEMOGLOBIN: 11.1 g/dL — AB (ref 12.0–15.0)
MCH: 26 pg (ref 26.0–34.0)
MCHC: 32.2 g/dL (ref 30.0–36.0)
MCV: 80.8 fL (ref 78.0–100.0)
Platelets: 266 10*3/uL (ref 150–400)
RBC: 4.27 MIL/uL (ref 3.87–5.11)
RDW: 17.3 % — ABNORMAL HIGH (ref 11.5–15.5)
WBC: 4.1 10*3/uL (ref 4.0–10.5)

## 2015-05-27 LAB — URINALYSIS, ROUTINE W REFLEX MICROSCOPIC
BILIRUBIN URINE: NEGATIVE
Glucose, UA: NEGATIVE mg/dL
Hgb urine dipstick: NEGATIVE
Ketones, ur: NEGATIVE mg/dL
LEUKOCYTES UA: NEGATIVE
NITRITE: NEGATIVE
PH: 5 (ref 5.0–8.0)
Protein, ur: NEGATIVE mg/dL
SPECIFIC GRAVITY, URINE: 1.01 (ref 1.005–1.030)

## 2015-05-27 LAB — LIPASE, BLOOD: Lipase: 24 U/L (ref 11–51)

## 2015-05-27 MED ORDER — SODIUM CHLORIDE 0.9 % IV BOLUS (SEPSIS)
1000.0000 mL | Freq: Once | INTRAVENOUS | Status: AC
Start: 2015-05-27 — End: 2015-05-27
  Administered 2015-05-27: 1000 mL via INTRAVENOUS

## 2015-05-27 MED ORDER — ONDANSETRON HCL 4 MG/2ML IJ SOLN
4.0000 mg | Freq: Once | INTRAMUSCULAR | Status: AC
  Administered 2015-05-27: 4 mg via INTRAVENOUS
  Filled 2015-05-27: qty 2

## 2015-05-27 MED ORDER — ZOLPIDEM TARTRATE 5 MG PO TABS
5.0000 mg | ORAL_TABLET | Freq: Every day | ORAL | Status: DC
  Administered 2015-05-28 – 2015-06-01 (×6): 5 mg via ORAL
  Filled 2015-05-27 (×6): qty 1

## 2015-05-27 MED ORDER — PAROXETINE HCL 20 MG PO TABS
40.0000 mg | ORAL_TABLET | Freq: Every morning | ORAL | Status: DC
  Administered 2015-05-28 – 2015-06-02 (×5): 40 mg via ORAL
  Filled 2015-05-27 (×6): qty 2

## 2015-05-27 MED ORDER — ONDANSETRON HCL 4 MG PO TABS
4.0000 mg | ORAL_TABLET | Freq: Four times a day (QID) | ORAL | Status: DC | PRN
  Administered 2015-05-30: 4 mg via ORAL
  Filled 2015-05-27: qty 1

## 2015-05-27 MED ORDER — FAMOTIDINE IN NACL 20-0.9 MG/50ML-% IV SOLN
20.0000 mg | Freq: Two times a day (BID) | INTRAVENOUS | Status: DC
  Administered 2015-05-28 – 2015-05-31 (×8): 20 mg via INTRAVENOUS
  Filled 2015-05-27 (×12): qty 50

## 2015-05-27 MED ORDER — HYDROMORPHONE HCL 1 MG/ML IJ SOLN
1.0000 mg | INTRAMUSCULAR | Status: DC | PRN
  Administered 2015-05-28 – 2015-06-02 (×34): 1 mg via INTRAVENOUS
  Filled 2015-05-27 (×36): qty 1

## 2015-05-27 MED ORDER — MORPHINE SULFATE (PF) 4 MG/ML IV SOLN
4.0000 mg | Freq: Once | INTRAVENOUS | Status: AC
  Administered 2015-05-27: 4 mg via INTRAVENOUS
  Filled 2015-05-27: qty 1

## 2015-05-27 MED ORDER — ENOXAPARIN SODIUM 40 MG/0.4ML ~~LOC~~ SOLN
40.0000 mg | SUBCUTANEOUS | Status: DC
  Administered 2015-05-28 – 2015-06-02 (×6): 40 mg via SUBCUTANEOUS
  Filled 2015-05-27 (×6): qty 0.4

## 2015-05-27 MED ORDER — METOCLOPRAMIDE HCL 5 MG/ML IJ SOLN
10.0000 mg | Freq: Once | INTRAMUSCULAR | Status: AC
  Administered 2015-05-27: 10 mg via INTRAVENOUS
  Filled 2015-05-27: qty 2

## 2015-05-27 MED ORDER — KCL IN DEXTROSE-NACL 20-5-0.9 MEQ/L-%-% IV SOLN
INTRAVENOUS | Status: DC
  Administered 2015-05-28: 01:00:00 via INTRAVENOUS

## 2015-05-27 MED ORDER — ONDANSETRON HCL 4 MG/2ML IJ SOLN
4.0000 mg | Freq: Four times a day (QID) | INTRAMUSCULAR | Status: DC | PRN
  Administered 2015-05-28 – 2015-05-30 (×7): 4 mg via INTRAVENOUS
  Filled 2015-05-27 (×11): qty 2

## 2015-05-27 NOTE — H&P (Signed)
[facility] History and Physical  Jordan Haney [id number] DOB: January 22, 1956    PCP:   No PCP Per Patient   Chief Complaint: epigastric abd pain  HPI: Jordan Haney is an 60 y.o. female  With a hx of PUD, pancreatitis, and cholecystectomy presents to the ED complaining of severe intermittent epigastric abd pain onset 2 days ago with associated N/V. She reports that that she cannot eat without vomiting shortly afterwards due to the pain. Pain subsides when she does not eat. She states that N/V is related to pain. Pt reports a transient fever that subsided last night. Denies black or bloody stool and had BM this morning. She has not taken any NSAIDS. While in the ED, workup revealed Lipase 24, AST 23 and ALT 14. Total Bili 0.2. WBC 4.1. VSS. Hospitalist was asked to for symptomatic control since pain persists despite pain medicaton as well as related nausea and vomiting.   Rewiew of Systems:  Constitutional: Negative for malaise, fever and chills. No significant weight loss or weight gain Eyes: Negative for eye pain, redness and discharge, diplopia, visual changes, or flashes of light. ENMT: Negative for ear pain, hoarseness, nasal congestion, sinus pressure and sore throat. No headaches; tinnitus, drooling, or problem swallowing. Cardiovascular: Negative for chest pain, palpitations, diaphoresis, dyspnea and peripheral edema. ; No orthopnea, PND Respiratory: Negative for cough, hemoptysis, wheezing and stridor. No pleuritic chestpain. Gastrointestinal: Negative for nausea, vomiting, diarrhea, constipation, abdominal pain, melena, blood in stool, hematemesis, jaundice and rectal bleeding.    Genitourinary: Negative for frequency, dysuria, incontinence,flank pain and hematuria; Musculoskeletal: Negative for back pain and neck pain. Negative for swelling and trauma.;  Skin: . Negative for pruritus, rash, abrasions, bruising and skin lesion.; ulcerations Neuro: Negative for headache,  lightheadedness and neck stiffness. Negative for weakness, altered level of consciousness , altered mental status, extremity weakness, burning feet, involuntary movement, seizure and syncope.  Psych: negative for anxiety, depression, insomnia, tearfulness, panic attacks, hallucinations, paranoia, suicidal or homicidal ideation    Past Medical History  Diagnosis Date  . RA (rheumatoid arthritis) ([place])  2008  . Constipation 03/21/2011  . Hypokalemia 03/21/2011  . Fatty liver 03/21/2011  . Pancreatitis     states 3 years ago, very severe, ?biliary pancreatitis   . T12 compression fracture ([place]) 2011  . Duodenal ulcer     remote per patient. +BC powders, patient report negative EGD six months ago  . Dilated pancreatic duct ([place])     ?chronic pancreatitis, EUS pending (06/02/11)  . Kidney stones   . Chronic abdominal pain   . Pneumonia     as child  . Anemia   . Lumbar stress fracture   . Anxiety     Past Surgical History  Procedure Laterality Date  . Cholecystectomy    . Knee surgery    . Appendectomy    . Complete hysterectomy    . Ventral wall hernia    . Esophagogastroduodenoscopy  08/2010    Dr. Earley Brooke, Versed. small hh, mild prepylori gastritis. path unavailable  . Esophagogastroduodenoscopy  04/2010    Dr. Earley Brooke, Versed. small hh, mild distal esophagitis, antral gastritis and duodenitis. Bx mild chronic gastritis and no H.pylori  . Esophagogastroduodenoscopy  08/2009    Dr. Posey Pronto, Versed. Moderate gastritis, moderate duodenitis with nodularity in proximal duodenal bulb, bx chronic gastritis, no helicobacter, mild chronic duodenitis  . Esophagogastroduodenoscopy  02/2007    Dr. Earley Brooke, Versed. antral gastritis and duodenal ulcers. Bx mild chronic gastritis. No bx from  duodenal ulcer available.  [name] dilation  04/06/2011    Procedure: SAVORY DILATION;  Surgeon: Dorothyann Peng, MD;  Location: [facility];  Service: Endoscopy;  Laterality: N/A;  16 mm  . Esophagogastroduodenoscopy   04/06/11    Dr. Oneida Alar: 1-2 cm hiatal hernia, mod gastritis, 54mmX3mm linear ulcer in duodenal bulb, stricture 1st part of duodenum, dilated to 12 mm  . Esophagogastroduodenoscopy  05/12/11    partial gastric oulet obstruction secondary to stricture between bulb/2nd portion of duodenum with marked friability and inflammation but no discrete ulcer, dilated stomach. s/p dilation but high risk for restenosis  . Esophagogastroduodenoscopy  06/22/2011    [id number] ring/Moderate gastritis/PERSISTENT Ulcer in the bulb/descending duodenum Stricture in the bulb/descending duodenum  . Gastrojejunostomy  06/28/2011    Procedure: GASTROJEJUNOSTOMY;  Surgeon: Donato Heinz, MD;  Location: [facility];  Service: General;  Laterality: N/A;  Roux-en-y, Choledocalduodenostomy  . Abdominal hysterectomy    . Kidney stone surgery    . Esophagogastroduodenoscopy Left 06/25/2012    RMR: Normal esophagus. Surgically altered anastomosis to  small intestine noted. Couple of  at the anastomosis.  Patent efferent limb Patient had an oozing 1 cm anastomotic ulcer  . Back surgery  2013  . Colonoscopy N/A 08/16/2012    Procedure: COLONOSCOPY;  Surgeon: Daneil Dolin, MD;  Location: [facility];  Service: Endoscopy;  Laterality: N/A;  Allergic to Phenergan. Last procedure performed with Versed, Demerol, and Zofran. Give Zofran 4 mg IV on call  . Cardiac catheterization      2008  . Esophagogastroduodenoscopy Left 10/23/2012    Procedure: ESOPHAGOGASTRODUODENOSCOPY (EGD);  Surgeon: Daneil Dolin, MD;  Location: [facility];  Service: Endoscopy;  Laterality: Left;  . Total hip arthroplasty Left 01/2015    in [place], [place]  . Total hip arthroplasty      Medications:  HOME MEDS: Prior to Admission medications   Medication Sig Start Date End Date Taking? Authorizing Provider  ALPRAZolam Duanne Moron) 0.5 MG tablet Take 0.5 mg by mouth 2 (two) times Haney.   Yes Historical Provider, MD  aspirin EC 325 MG tablet Take 650 mg by mouth  once as needed for mild pain or moderate pain.   Yes Historical Provider, MD  calcium gluconate 500 MG tablet Take 1 tablet by mouth Haney.   Yes Historical Provider, MD  PARoxetine (PAXIL) 40 MG tablet Take 40 mg by mouth every morning.   Yes Historical Provider, MD  Vitamin D, Ergocalciferol, (DRISDOL) 50000 units CAPS capsule Take 50,000 Units by mouth 2 (two) times a week.   Yes Historical Provider, MD  zolpidem (AMBIEN) 5 MG tablet Take 5 mg by mouth at bedtime.    Yes Historical Provider, MD  HYDROcodone-acetaminophen (NORCO/VICODIN) 5-325 MG tablet Take 1-2 tablets by mouth every 6 (six) hours as needed for severe pain. Patient not taking: Reported on 05/27/2015 04/07/15   Gareth Morgan, MD  lidocaine (LIDODERM) 5 % Place 1 patch onto the skin Haney. Remove & Discard patch within 12 hours or as directed by MD Patient not taking: Reported on 05/27/2015 04/04/15   Evalee Jefferson, PA-C     Allergies:  Allergies  Allergen Reactions  . Compazine Anaphylaxis  . Phenergan [Promethazine] Anaphylaxis  . Vistaril [Hydroxyzine Hcl] Other (See Comments)    Reaction: legs shake  . Benadryl [Diphenhydramine Hcl] Other (See Comments)    Pt states, "it makes my legs shake really bad"   . Gabapentin Other (See Comments)    Reactions: legs shake  .  Latex Swelling    Social History:   reports that she has never smoked. She has never used smokeless tobacco. She reports that she does not drink alcohol or use illicit drugs.  Family History: Family History  Problem Relation Age of Onset  . Colon cancer Neg Hx   . Liver cancer Neg Hx   . Breast cancer Neg Hx   . Inflammatory bowel disease Neg Hx   . Heart attack Mother      Physical Exam: Filed Vitals:   05/27/15 2000 05/27/15 2030 05/27/15 2100 05/27/15 2130  BP: 127/89 116/79 127/85 130/93  Pulse: 72 73 73 68  Temp:      TempSrc:      Resp: 16 13 18 14   Height:      Weight:      SpO2: 97% 97% 100% 100%   Blood pressure 130/93, pulse 68,  temperature 98.8 F (37.1 C), temperature source Oral, resp. rate 14, height 5\' 3"  (1.6 m), weight 46.72 kg (103 lb), SpO2 100 %.  GEN:  Pleasant patient lying in the stretcher in no acute distress; cooperative with exam. PSYCH:  alert and oriented x4; does not appear anxious or depressed; affect is appropriate. HEENT: Mucous membranes pink and anicteric; PERRLA; EOM intact; no cervical lymphadenopathy nor thyromegaly or carotid bruit; no JVD; There were no stridor. Neck is very supple. Breasts:: Not examined CHEST WALL: No tenderness CHEST: Normal respiration, clear to auscultation bilaterally.  HEART: Regular rate and rhythm.  There are no murmur, rub, or gallops.   BACK: No kyphosis or scoliosis; no CVA tenderness ABDOMEN: soft and non-tender; no masses, no organomegaly, normal abdominal bowel sounds; no pannus; no intertriginous candida. There is no rebound and no distention.  Rectal Exam: Not done EXTREMITIES: No bone or joint deformity; age-appropriate arthropathy of the hands and knees; no edema; no ulcerations.  There is no calf tenderness. Genitalia: not examined PULSES: 2+ and symmetric SKIN: Normal hydration no rash or ulceration CNS: Cranial nerves 2-12 grossly intact no focal lateralizing neurologic deficit.  Speech is fluent; uvula elevated with phonation, facial symmetry and tongue midline. DTR are normal bilaterally, cerebella exam is intact, barbinski is negative and strengths are equaled bilaterally.  No sensory loss.   Labs on Admission:  Basic Metabolic Panel:  Recent Labs Lab 05/27/15 1543  NA 141  K 3.7  CL 110  CO2 24  GLUCOSE 89  BUN 8  CREATININE 0.49  CALCIUM 8.7*   Liver Function Tests:  Recent Labs Lab 05/27/15 1543  AST 23  ALT 14  ALKPHOS 159*  BILITOT 0.2*  PROT 6.7  ALBUMIN 3.5    Recent Labs Lab 05/27/15 1543  LIPASE 24   No results for input(s): AMMONIA in the last 168 hours. CBC:  Recent Labs Lab 05/27/15 1543  WBC 4.1  HGB  11.1*  HCT 34.5*  MCV 80.8  PLT 266   Assessment/Plan 1. Abdominal pain. Unclear exact etiology, but her abdominal exam is benign, with non critical labs.  Will give clear liquid and Tx symptoms. Dilaudid PRN. Will order RUQ [place].  She doesn't require an Abd/Pelvic CT unless her symptoms persists or worsen.  Will start PPI IV in case she has exacerbation of her prior PUD.  Will check B12 level as she had partial gastrectomy due to bleeding ulcer about 3 years ago.  She is stable, full code, and will be admitted to [facility] service.  Thank you and Good Day.    Other plans as  per orders. Code Status: Full   Orvan Falconer, MD. [facility] Pager [phone] 7pm to 7am.  05/27/2015, 10:07 PM   By signing my name below, I, Delene Ruffini, attest that this documentation has been prepared under the direction and in the presence of Orvan Falconer, MD. Electronically Signed: Delene Ruffini, Scribe

## 2015-05-27 NOTE — Telephone Encounter (Signed)
REVIEWED-NO ADDITIONAL RECOMMENDATIONS. 

## 2015-05-27 NOTE — ED Notes (Signed)
Patient requested this nurse to call fiance and advised him that she was being admitted to the hospital. Gave this nurse his name and number, Thayer Jew [phone]. Called and advised patient's fiance of patient being admitted to hospital. Fiance verbalized understanding.

## 2015-05-27 NOTE — ED Notes (Signed)
Patient ambulated to restroom with one assist. Patient gait steady during ambulation.

## 2015-05-27 NOTE — Telephone Encounter (Signed)
I called pt and she said she has been having intermittent abdominal pain around her umbilical for 2-3 days. It is lasting longer now and she said the pain is severe. I told her she should go to the ED and she said she does not like the ED in [place]. I told her that she could go to [facility] if she felt like she could get there OK. She said she will go to [facility] ED.

## 2015-05-27 NOTE — ED Notes (Signed)
Pt c/o upper mid abdominal pain onset 2 days ago. Reports N/V

## 2015-05-27 NOTE — Telephone Encounter (Signed)
PLEASE CALL PATIENT [phone]  SHE CALLED STATING THAT SHE IS HAVING AWFUL STOMACH PAIN AND HAS NOT BEEN HERE IN YEARS, I TOLD HER I WOULD MAKE HER AN APPOINTMENT AND SHE SAID SHE COULD NOT WAIT FOR AN APPOINTMENT AND WOULD JUST GO TO THE ER

## 2015-05-27 NOTE — ED Notes (Signed)
Patient vomiting at this time. Dr Christy Gentles aware.

## 2015-05-27 NOTE — ED Notes (Signed)
Pt resting with eyes closed, appears to be in no distress. Respirations are even and unlabored.  

## 2015-05-27 NOTE — ED Provider Notes (Signed)
CSN: [id number]     Arrival date & time 05/27/15  1459 History   First MD Initiated Contact with Patient 05/27/15 1616     Chief Complaint  Patient presents with  . Abdominal Pain    Patient is a 60 y.o. female presenting with abdominal pain. The history is provided by the patient and a significant other.  Abdominal Pain Pain location:  Epigastric Pain quality: aching   Pain radiates to:  Does not radiate Pain severity:  Moderate Onset quality:  Gradual Duration:  2 days Timing:  Intermittent Progression:  Worsening Chronicity:  Recurrent Relieved by:  Nothing Worsened by:  Movement and palpation Associated symptoms: diarrhea, fever and vomiting   Associated symptoms: no chest pain, no dysuria, no hematemesis and no hematochezia     Past Medical History  Diagnosis Date  . RA (rheumatoid arthritis) ([place])  2008  . Constipation 03/21/2011  . Hypokalemia 03/21/2011  . Fatty liver 03/21/2011  . Pancreatitis     states 3 years ago, very severe, ?biliary pancreatitis   . T12 compression fracture ([place]) 2011  . Duodenal ulcer     remote per patient. +BC powders, patient report negative EGD six months ago  . Dilated pancreatic duct ([place])     ?chronic pancreatitis, EUS pending (06/02/11)  . Kidney stones   . Chronic abdominal pain   . Pneumonia     as child  . Anemia   . Lumbar stress fracture   . Anxiety    Past Surgical History  Procedure Laterality Date  . Cholecystectomy    . Knee surgery    . Appendectomy    . Complete hysterectomy    . Ventral wall hernia    . Esophagogastroduodenoscopy  08/2010    Dr. Earley Brooke, Versed. small hh, mild prepylori gastritis. path unavailable  . Esophagogastroduodenoscopy  04/2010    Dr. Earley Brooke, Versed. small hh, mild distal esophagitis, antral gastritis and duodenitis. Bx mild chronic gastritis and no H.pylori  . Esophagogastroduodenoscopy  08/2009    Dr. Posey Pronto, Versed. Moderate gastritis, moderate duodenitis with nodularity in proximal  duodenal bulb, bx chronic gastritis, no helicobacter, mild chronic duodenitis  . Esophagogastroduodenoscopy  02/2007    Dr. Earley Brooke, Versed. antral gastritis and duodenal ulcers. Bx mild chronic gastritis. No bx from duodenal ulcer available.  [name] dilation  04/06/2011    Procedure: SAVORY DILATION;  Surgeon: Dorothyann Peng, MD;  Location: [facility];  Service: Endoscopy;  Laterality: N/A;  16 mm  . Esophagogastroduodenoscopy  04/06/11    Dr. Oneida Alar: 1-2 cm hiatal hernia, mod gastritis, 51mmX3mm linear ulcer in duodenal bulb, stricture 1st part of duodenum, dilated to 12 mm  . Esophagogastroduodenoscopy  05/12/11    partial gastric oulet obstruction secondary to stricture between bulb/2nd portion of duodenum with marked friability and inflammation but no discrete ulcer, dilated stomach. s/p dilation but high risk for restenosis  . Esophagogastroduodenoscopy  06/22/2011    [id number] ring/Moderate gastritis/PERSISTENT Ulcer in the bulb/descending duodenum Stricture in the bulb/descending duodenum  . Gastrojejunostomy  06/28/2011    Procedure: GASTROJEJUNOSTOMY;  Surgeon: Donato Heinz, MD;  Location: [facility];  Service: General;  Laterality: N/A;  Roux-en-y, Choledocalduodenostomy  . Abdominal hysterectomy    . Kidney stone surgery    . Esophagogastroduodenoscopy Left 06/25/2012    RMR: Normal esophagus. Surgically altered anastomosis to  small intestine noted. Couple of  at the anastomosis.  Patent efferent limb Patient had an oozing 1 cm anastomotic ulcer  . Back surgery  2013  .  Colonoscopy N/A 08/16/2012    Procedure: COLONOSCOPY;  Surgeon: Daneil Dolin, MD;  Location: [facility];  Service: Endoscopy;  Laterality: N/A;  Allergic to Phenergan. Last procedure performed with Versed, Demerol, and Zofran. Give Zofran 4 mg IV on call  . Cardiac catheterization      2008  . Esophagogastroduodenoscopy Left 10/23/2012    Procedure: ESOPHAGOGASTRODUODENOSCOPY (EGD);  Surgeon: Daneil Dolin, MD;   Location: [facility];  Service: Endoscopy;  Laterality: Left;  . Total hip arthroplasty Left 01/2015    in [place], [place]  . Total hip arthroplasty     Family History  Problem Relation Age of Onset  . Colon cancer Neg Hx   . Liver cancer Neg Hx   . Breast cancer Neg Hx   . Inflammatory bowel disease Neg Hx   . Heart attack Mother    Social History  Substance Use Topics  . Smoking status: Never Smoker   . Smokeless tobacco: Never Used  . Alcohol Use: No   OB History    No data available     Review of Systems  Constitutional: Positive for fever.  Cardiovascular: Negative for chest pain.  Gastrointestinal: Positive for vomiting, abdominal pain and diarrhea. Negative for hematochezia and hematemesis.  Genitourinary: Negative for dysuria.  All other systems reviewed and are negative.     Allergies  Compazine; Phenergan; Vistaril; Benadryl; Gabapentin; and Latex  Home Medications   Prior to Admission medications   Medication Sig Start Date End Date Taking? Authorizing Provider  ALPRAZolam Duanne Moron) 0.5 MG tablet Take 0.5 mg by mouth 2 (two) times daily.    Historical Provider, MD  calcium gluconate 500 MG tablet Take 1 tablet by mouth daily.    Historical Provider, MD  HYDROcodone-acetaminophen (NORCO/VICODIN) 5-325 MG tablet Take 1-2 tablets by mouth every 6 (six) hours as needed for severe pain. 04/07/15   Gareth Morgan, MD  ketorolac (TORADOL) 10 MG tablet Take 10 mg by mouth every 6 (six) hours as needed for moderate pain or severe pain.    Historical Provider, MD  lidocaine (LIDODERM) 5 % Place 1 patch onto the skin daily. Remove & Discard patch within 12 hours or as directed by MD 04/04/15   Evalee Jefferson, PA-C  methotrexate (50 MG/ML) 1 g injection Inject into the vein once a week. Takes on Mondays.    Historical Provider, MD  PARoxetine (PAXIL) 40 MG tablet Take 40 mg by mouth every morning.    Historical Provider, MD  Vitamin D, Ergocalciferol, (DRISDOL) 50000 units CAPS  capsule Take 50,000 Units by mouth 2 (two) times a week.    Historical Provider, MD  zolpidem (AMBIEN) 5 MG tablet Take 5 mg by mouth at bedtime.     Historical Provider, MD   BP 126/97 mmHg  Pulse 74  Temp(Src) 98.7 F (37.1 C) (Oral)  Resp 21  Ht 5\' 3"  (1.6 m)  Wt 46.72 kg  BMI 18.25 kg/m2  SpO2 97% Physical Exam CONSTITUTIONAL: thin appearing HEAD: Normocephalic/atraumatic EYES: EOMI/PERRL, no icterus ENMT: Mucous membranes dry NECK: supple no meningeal signs SPINE/BACK:entire spine nontender CV: S1/S2 noted, no murmurs/rubs/gallops noted LUNGS: Lungs are clear to auscultation bilaterally, no apparent distress ABDOMEN: soft, moderate epigastric tenderness, no rebound or guarding, bowel sounds noted throughout abdomen GU:no cva tenderness NEURO: Pt is awake/alert/appropriate, moves all extremitiesx4.  No facial droop.   EXTREMITIES: pulses normal/equal, full ROM SKIN: warm, color normal PSYCH: anxious  ED Course  Procedures  Medications  morphine 4 MG/ML  injection 4 mg (4 mg Intravenous Given 05/27/15 1716)  ondansetron (ZOFRAN) injection 4 mg (4 mg Intravenous Given 05/27/15 1716)  sodium chloride 0.9 % bolus 1,000 mL (0 mLs Intravenous Stopped 05/27/15 1859)  ondansetron (ZOFRAN) injection 4 mg (4 mg Intravenous Given 05/27/15 1758)  morphine 4 MG/ML injection 4 mg (4 mg Intravenous Given 05/27/15 1758)  ondansetron (ZOFRAN) injection 4 mg (4 mg Intravenous Given 05/27/15 1915)  morphine 4 MG/ML injection 4 mg (4 mg Intravenous Given 05/27/15 1922)  metoCLOPramide (REGLAN) injection 10 mg (10 mg Intravenous Given 05/27/15 2032)  morphine 4 MG/ML injection 4 mg (4 mg Intravenous Given 05/27/15 2146)    Labs Review Labs Reviewed  COMPREHENSIVE METABOLIC PANEL - Abnormal; Notable for the following:    Calcium 8.7 (*)    Alkaline Phosphatase 159 (*)    Total Bilirubin 0.2 (*)    All other components within normal limits  CBC - Abnormal; Notable for the following:    Hemoglobin 11.1  (*)    HCT 34.5 (*)    RDW 17.3 (*)    All other components within normal limits  LIPASE, BLOOD  URINALYSIS, ROUTINE W REFLEX MICROSCOPIC (NOT AT [facility])    I have personally reviewed and evaluated these lab results as part of my medical decision-making.   EKG Interpretation   Date/Time:  Tuesday May 27 2015 17:05:34 EST Ventricular Rate:  79 PR Interval:  156 QRS Duration: 90 QT Interval:  376 QTC Calculation: 431 R Axis:   14 Text Interpretation:  Sinus rhythm Anterior infarct, old No significant  change since last tracing Confirmed by Christy Gentles  MD, Sharone Almond ([id number]) on  05/27/2015 5:20:26 PM     10:45 PM Pt monitored for several hours At times her pain would improve, then return It was always focused in epigastric region She would then have episodes of vomiting She admits to having this previously, usually due to chronic pancreatitis At this point, defer CT imaging as pain never radiated, and abdomen remained soft throughout ED stay Clinically she did not have signs of bowel obstruction, and already s/p cholecystectomy Will admit for pain and nausea control D/w dr Marin Comment for admission  MDM   Final diagnoses:  Intractable abdominal pain  Intractable vomiting with nausea, vomiting of unspecified type    Nursing notes including past medical history and social history reviewed and considered in documentation Labs/vital reviewed myself and considered during evaluation Previous records reviewed and considered     Ripley Fraise, MD 05/27/15 2249

## 2015-05-28 ENCOUNTER — Inpatient Hospital Stay (HOSPITAL_COMMUNITY)

## 2015-05-28 ENCOUNTER — Observation Stay (HOSPITAL_COMMUNITY)

## 2015-05-28 DIAGNOSIS — Z8711 Personal history of peptic ulcer disease: Secondary | ICD-10-CM | POA: Diagnosis not present

## 2015-05-28 DIAGNOSIS — Z8249 Family history of ischemic heart disease and other diseases of the circulatory system: Secondary | ICD-10-CM | POA: Diagnosis not present

## 2015-05-28 DIAGNOSIS — Z87442 Personal history of urinary calculi: Secondary | ICD-10-CM | POA: Diagnosis not present

## 2015-05-28 DIAGNOSIS — F419 Anxiety disorder, unspecified: Secondary | ICD-10-CM | POA: Diagnosis present

## 2015-05-28 DIAGNOSIS — Z9049 Acquired absence of other specified parts of digestive tract: Secondary | ICD-10-CM | POA: Diagnosis not present

## 2015-05-28 DIAGNOSIS — K219 Gastro-esophageal reflux disease without esophagitis: Secondary | ICD-10-CM | POA: Diagnosis present

## 2015-05-28 DIAGNOSIS — F411 Generalized anxiety disorder: Secondary | ICD-10-CM | POA: Diagnosis not present

## 2015-05-28 DIAGNOSIS — R1013 Epigastric pain: Secondary | ICD-10-CM

## 2015-05-28 DIAGNOSIS — Z96642 Presence of left artificial hip joint: Secondary | ICD-10-CM | POA: Diagnosis present

## 2015-05-28 DIAGNOSIS — Z90710 Acquired absence of both cervix and uterus: Secondary | ICD-10-CM | POA: Diagnosis not present

## 2015-05-28 DIAGNOSIS — K760 Fatty (change of) liver, not elsewhere classified: Secondary | ICD-10-CM | POA: Diagnosis present

## 2015-05-28 DIAGNOSIS — R1110 Vomiting, unspecified: Secondary | ICD-10-CM | POA: Diagnosis not present

## 2015-05-28 DIAGNOSIS — K5909 Other constipation: Secondary | ICD-10-CM | POA: Diagnosis not present

## 2015-05-28 DIAGNOSIS — K5900 Constipation, unspecified: Secondary | ICD-10-CM | POA: Diagnosis present

## 2015-05-28 DIAGNOSIS — Z903 Acquired absence of stomach [part of]: Secondary | ICD-10-CM | POA: Diagnosis not present

## 2015-05-28 DIAGNOSIS — R339 Retention of urine, unspecified: Secondary | ICD-10-CM | POA: Diagnosis present

## 2015-05-28 DIAGNOSIS — K2970 Gastritis, unspecified, without bleeding: Secondary | ICD-10-CM | POA: Diagnosis present

## 2015-05-28 DIAGNOSIS — M069 Rheumatoid arthritis, unspecified: Secondary | ICD-10-CM | POA: Diagnosis present

## 2015-05-28 DIAGNOSIS — R109 Unspecified abdominal pain: Secondary | ICD-10-CM | POA: Diagnosis present

## 2015-05-28 LAB — COMPREHENSIVE METABOLIC PANEL
ALK PHOS: 141 U/L — AB (ref 38–126)
ALT: 13 U/L — ABNORMAL LOW (ref 14–54)
ANION GAP: 8 (ref 5–15)
AST: 24 U/L (ref 15–41)
Albumin: 3.1 g/dL — ABNORMAL LOW (ref 3.5–5.0)
BILIRUBIN TOTAL: 0.5 mg/dL (ref 0.3–1.2)
BUN: 5 mg/dL — ABNORMAL LOW (ref 6–20)
CALCIUM: 8.5 mg/dL — AB (ref 8.9–10.3)
CO2: 24 mmol/L (ref 22–32)
Chloride: 113 mmol/L — ABNORMAL HIGH (ref 101–111)
Creatinine, Ser: 0.53 mg/dL (ref 0.44–1.00)
Glucose, Bld: 89 mg/dL (ref 65–99)
Potassium: 3.9 mmol/L (ref 3.5–5.1)
Sodium: 145 mmol/L (ref 135–145)
TOTAL PROTEIN: 6 g/dL — AB (ref 6.5–8.1)

## 2015-05-28 LAB — CBC
HCT: 34.1 % — ABNORMAL LOW (ref 36.0–46.0)
HEMOGLOBIN: 10.6 g/dL — AB (ref 12.0–15.0)
MCH: 25.5 pg — ABNORMAL LOW (ref 26.0–34.0)
MCHC: 31.1 g/dL (ref 30.0–36.0)
MCV: 82.2 fL (ref 78.0–100.0)
Platelets: 227 10*3/uL (ref 150–400)
RBC: 4.15 MIL/uL (ref 3.87–5.11)
RDW: 17.4 % — ABNORMAL HIGH (ref 11.5–15.5)
WBC: 5.4 10*3/uL (ref 4.0–10.5)

## 2015-05-28 MED ORDER — ALPRAZOLAM 0.5 MG PO TABS
0.5000 mg | ORAL_TABLET | Freq: Two times a day (BID) | ORAL | Status: DC
  Administered 2015-05-28 – 2015-06-02 (×8): 0.5 mg via ORAL
  Filled 2015-05-28 (×10): qty 1

## 2015-05-28 MED ORDER — POTASSIUM CHLORIDE 2 MEQ/ML IV SOLN: INTRAVENOUS | Status: DC

## 2015-05-28 MED ORDER — KCL IN DEXTROSE-NACL 20-5-0.45 MEQ/L-%-% IV SOLN
INTRAVENOUS | Status: DC
  Administered 2015-05-28 – 2015-06-02 (×7): via INTRAVENOUS

## 2015-05-28 MED ORDER — IOHEXOL 300 MG/ML  SOLN
100.0000 mL | Freq: Once | INTRAMUSCULAR | Status: AC | PRN
  Administered 2015-05-28: 100 mL via INTRAVENOUS

## 2015-05-28 MED ORDER — FAMOTIDINE IN NACL 20-0.9 MG/50ML-% IV SOLN
INTRAVENOUS | Status: AC
  Filled 2015-05-28: qty 50

## 2015-05-28 MED ORDER — PANTOPRAZOLE SODIUM 40 MG IV SOLR
40.0000 mg | Freq: Two times a day (BID) | INTRAVENOUS | Status: DC
  Administered 2015-05-28 – 2015-06-01 (×9): 40 mg via INTRAVENOUS
  Filled 2015-05-28 (×9): qty 40

## 2015-05-28 NOTE — Care Management Note (Signed)
Case Management Note  Patient Details  Name: Jordan Haney MRN: [id number] Date of Birth: 1955-09-29  Subjective/Objective:                  Pt admitted with abd pain. Pt is from home, lives alone but has been staying with her fiance since being sick. Pt is ind with ADL's. Pt has PCP at the "residency center" in [place]. Pt has insurance, and no problems getting medications. Pt drives herself to appointments. Pt plans to return home with self care.   Action/Plan: No CM needs anticipated.   Expected Discharge Date:     05/28/2015             Expected Discharge Plan:  Home/Self Care  In-House Referral:  NA  Discharge planning Services  CM Consult  Post Acute Care Choice:  NA Choice offered to:  NA  DME Arranged:    DME Agency:     HH Arranged:    HH Agency:     Status of Service:  Completed, signed off  Medicare Important Message Given:    Date Medicare IM Given:    Medicare IM give by:    Date Additional Medicare IM Given:    Additional Medicare Important Message give by:     If discussed at [place] of Stay Meetings, dates discussed:    Additional Comments:  Sherald Barge, RN 05/28/2015, 11:17 AM

## 2015-05-28 NOTE — Progress Notes (Signed)
[facility] PROGRESS NOTE  Jordan Haney [id number] DOB: 06-06-1955 DOA: 05/27/2015 PCP: No PCP Per Patient  Assessment/Plan: 1. Abdominal pain with associated nausea and vomiting. Etiology unclear. On admission abd exam was found to be benign and labs unremarkable. Abdominal ultrasound was unrevealing. Since she is having continued discomfort and is unable to tolerate anything by mouth, will check CT abdomen/pelvis. Start the patient on BID PPI for any element of gastritis. She is s/p cholecystectomy, has unremarkable LFTs and lipase. 2. Anxiety. Continue home dose of xanax   Code Status: Full DVT prophylaxis:Lovenox  Family Communication: discussed with patient  Disposition Plan: discharge home once improved   Consultants:    Procedures:    Antibiotics:    HPI/Subjective: Keeps her eyes closed. Complains of epigastric pain. Has vomiting. Unable to tolerate anything by mouth. Also reports loose stools  Objective: Filed Vitals:   05/27/15 2330 02-Jun-2015 0003  BP: 122/84 129/86  Pulse:  70  Temp:  97.5 F (36.4 C)  Resp: 10 12    Intake/Output Summary (Last 24 hours) at June 02, 2015 0909 Last data filed at 2015-06-02 0600  Gross per 24 hour  Intake    590 ml  Output      0 ml  Net    590 ml   Filed Weights   05/27/15 1509 06-02-2015 0003  Weight: 46.72 kg (103 lb) 48.626 kg (107 lb 3.2 oz)    Exam:   General:  NAd  Cardiovascular: s1, s2 rrr  Respiratory: cta b  Abdomen: soft, tender in epigastrium, bd+  Musculoskeletal:  No edema b/l   Data Reviewed: Basic Metabolic Panel:  Recent Labs Lab 05/27/15 1543 06/02/2015 0541  NA 141 145  K 3.7 3.9  CL 110 113*  CO2 24 24  GLUCOSE 89 89  BUN 8 5*  CREATININE 0.49 0.53  CALCIUM 8.7* 8.5*   Liver Function Tests:  Recent Labs Lab 05/27/15 1543 06/02/15 0541  AST 23 24  ALT 14 13*  ALKPHOS 159* 141*  BILITOT 0.2* 0.5  PROT 6.7 6.0*  ALBUMIN 3.5 3.1*    Recent Labs Lab  05/27/15 1543  LIPASE 24   No results for input(s): AMMONIA in the last 168 hours. CBC:  Recent Labs Lab 05/27/15 1543 06/02/2015 0541  WBC 4.1 5.4  HGB 11.1* 10.6*  HCT 34.5* 34.1*  MCV 80.8 82.2  PLT 266 227   Cardiac Enzymes: No results for input(s): CKTOTAL, CKMB, CKMBINDEX, TROPONINI in the last 168 hours. BNP (last 3 results) No results for input(s): BNP in the last 8760 hours.  ProBNP (last 3 results) No results for input(s): PROBNP in the last 8760 hours.  CBG: No results for input(s): GLUCAP in the last 168 hours.  No results found for this or any previous visit (from the past 240 hour(s)).   Studies: US Abdomen Limited Ruq  Jun 02, 2015  CLINICAL DATA:  Nausea and vomiting with abdominal pain for 3 days. History of prior cholecystectomy. EXAM: US ABDOMEN LIMITED - RIGHT UPPER QUADRANT COMPARISON:  08/18/2012 abdominal CT FINDINGS: Gallbladder: Cholecystectomy. Common bile duct: Diameter: Mildly dilated after cholecystectomy. Example 13 mm in the porta hepatis, tapering to 11 mm in the pancreatic head. New since 08/18/2012. Liver: Moderate hepatic steatosis.  No intrahepatic duct dilatation. IMPRESSION: 1. Cholecystectomy with mild common duct dilatation, maximally 13 mm. Consider correlation with bilirubin levels. If these are elevated, consider MRCP. 2. Moderate hepatic steatosis. Electronically Signed   By: Abigail Miyamoto M.D.   On: 02-Jun-2015 08:48  Scheduled Meds: . enoxaparin (LOVENOX) injection  40 mg Subcutaneous Q24H  . famotidine (PEPCID) IV  20 mg Intravenous Q12H  . PARoxetine  40 mg Oral q morning - 10a  . zolpidem  5 mg Oral QHS   Continuous Infusions: . dextrose 5 % and 0.9 % NaCl with KCl 20 mEq/L 100 mL/hr at 05/28/15 0036    Principal Problem:   Epigastric pain Active Problems:   Nausea and vomiting   Rheumatoid arthritis (HCC)   Thoracic compression fracture ([place])    Time spent: 70mins   Kathie Dike, MD   [facility] Pager  [phone]. If 7PM-7AM, please contact night-coverage at [email or web address], password [facility] 05/28/2015, 9:09 AM

## 2015-05-29 DIAGNOSIS — K5900 Constipation, unspecified: Principal | ICD-10-CM

## 2015-05-29 MED ORDER — MILK AND MOLASSES ENEMA
1.0000 | Freq: Once | RECTAL | Status: AC
  Administered 2015-05-29: 250 mL via RECTAL

## 2015-05-29 MED ORDER — BISACODYL 10 MG RE SUPP
10.0000 mg | Freq: Once | RECTAL | Status: AC
  Administered 2015-05-29: 10 mg via RECTAL
  Filled 2015-05-29: qty 1

## 2015-05-29 NOTE — Progress Notes (Signed)
[facility] PROGRESS NOTE  Jordan Haney [id number] DOB: January 09, 1956 DOA: 05/27/2015 PCP: No PCP Per Patient  Assessment/Plan: 1. Abdominal pain with associated nausea and vomiting. Etiology unclear. On admission abd exam was found to be benign and labs unremarkable. Abdominal ultrasound was unrevealing. There were no other acute findings. Continue on BID PPI for any element of gastritis. She is s/p cholecystectomy, has unremarkable LFTs and lipase. Order bladder scan; may have catheter inserted depending on results. Will give enemas to aid with constipation. Advance diet as tolerated. 2. Urinary retention, possibly related to constipation. CT abd/pelvis revealed she has a distended urinary bladder and recommends clinical correlation for urinary retention. Will order bladder scan. If pt has fair amount of residual urine, will have to place foley catheter. 3. Anxiety. Continue home dose of xanax   Code Status: Full DVT prophylaxis:Lovenox  Family Communication: no family at bedside Disposition Plan: discharge home once improved   Consultants:  none  Procedures:  none  Antibiotics:  none  HPI/Subjective: Has been doing really poorly. Vomiting all night. If she even sips something, she will vomit. Has been able to urinate. Pain persists.   Objective: Filed Vitals:   05/28/15 2300 05/29/15 0626  BP: 130/83 140/96  Pulse: 72 80  Temp: 97.6 F (36.4 C) 98.7 F (37.1 C)  Resp: 17 16   No intake or output data in the 24 hours ending 05/29/15 0720 Filed Weights   05/27/15 1509 05/28/15 0003  Weight: 46.72 kg (103 lb) 48.626 kg (107 lb 3.2 oz)    Exam:  General:still experiencing pain Cardiovascular: RRR, S1, S2  Respiratory: clear bilaterally, No wheezing, rales or rhonchi Abdomen: sluggish bowel sounds, diffuse abd tenderness, soft Musculoskeletal: No edema b/l   Data Reviewed: Basic Metabolic Panel:  Recent Labs Lab 05/27/15 1543 05/28/15 0541  NA  141 145  K 3.7 3.9  CL 110 113*  CO2 24 24  GLUCOSE 89 89  BUN 8 5*  CREATININE 0.49 0.53  CALCIUM 8.7* 8.5*   Liver Function Tests:  Recent Labs Lab 05/27/15 1543 05/28/15 0541  AST 23 24  ALT 14 13*  ALKPHOS 159* 141*  BILITOT 0.2* 0.5  PROT 6.7 6.0*  ALBUMIN 3.5 3.1*    Recent Labs Lab 05/27/15 1543  LIPASE 24   No results for input(s): AMMONIA in the last 168 hours. CBC:  Recent Labs Lab 05/27/15 1543 05/28/15 0541  WBC 4.1 5.4  HGB 11.1* 10.6*  HCT 34.5* 34.1*  MCV 80.8 82.2  PLT 266 227   Cardiac Enzymes: No results for input(s): CKTOTAL, CKMB, CKMBINDEX, TROPONINI in the last 168 hours. BNP (last 3 results) No results for input(s): BNP in the last 8760 hours.  ProBNP (last 3 results) No results for input(s): PROBNP in the last 8760 hours.  CBG: No results for input(s): GLUCAP in the last 168 hours.  No results found for this or any previous visit (from the past 240 hour(s)).   Studies: Ct Abdomen Pelvis W Contrast  05/28/2015  CLINICAL DATA:  Abdominal pain. Nausea with vomiting. Prior cholecystectomy. EXAM: CT ABDOMEN AND PELVIS WITH CONTRAST TECHNIQUE: Multidetector CT imaging of the abdomen and pelvis was performed using the standard protocol following bolus administration of intravenous contrast. CONTRAST:  134mL OMNIPAQUE IOHEXOL 300 MG/ML  SOLN COMPARISON:  08/18/2012 FINDINGS: Lower chest:  No acute findings. Hepatobiliary: Severe hepatic steatosis again demonstrated. No liver masses identified. Prior cholecystectomy noted. No evidence of biliary dilatation. Pancreas: No mass, inflammatory changes, or other  significant abnormality. Spleen: Within normal limits in size and appearance. Adrenals/Urinary Tract: No masses identified. No evidence of hydronephrosis. Small left renal cyst again noted. Distended urinary bladder. Stomach/Bowel: No evidence of obstruction, inflammatory process, or abnormal fluid collections. Large colonic stool burden again  noted. Vascular/Lymphatic: No pathologically enlarged lymph nodes. No evidence of abdominal aortic aneurysm. Reproductive: Prior hysterectomy noted. Adnexal regions are unremarkable in appearance. Other: Left hip prosthesis results in beam hardening artifact through the inferior aspect of the pelvis. Musculoskeletal: No suspicious bone lesions identified. 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 IMPRESSION: Distended urinary bladder ; recommend clinical correlation for urinary retention. No evidence hydronephrosis or other acute findings. Stable severe hepatic steatosis. Large stool burden noted; suggest clinical correlation for possible constipation. Electronically Signed   By: Earle Gell M.D.   On: 05/28/2015 21:39   US Abdomen Limited Ruq  05/28/2015  CLINICAL DATA:  Nausea and vomiting with abdominal pain for 3 days. History of prior cholecystectomy. EXAM: US ABDOMEN LIMITED - RIGHT UPPER QUADRANT COMPARISON:  08/18/2012 abdominal CT FINDINGS: Gallbladder: Cholecystectomy. Common bile duct: Diameter: Mildly dilated after cholecystectomy. Example 13 mm in the porta hepatis, tapering to 11 mm in the pancreatic head. New since 08/18/2012. Liver: Moderate hepatic steatosis.  No intrahepatic duct dilatation. IMPRESSION: 1. Cholecystectomy with mild common duct dilatation, maximally 13 mm. Consider correlation with bilirubin levels. If these are elevated, consider MRCP. 2. Moderate hepatic steatosis. Electronically Signed   By: Abigail Miyamoto M.D.   On: 05/28/2015 08:48    Scheduled Meds: . ALPRAZolam  0.5 mg Oral BID  . enoxaparin (LOVENOX) injection  40 mg Subcutaneous Q24H  . famotidine (PEPCID) IV  20 mg Intravenous Q12H  . pantoprazole (PROTONIX) IV  40 mg Intravenous Q12H  . PARoxetine  40 mg Oral q morning - 10a  . zolpidem  5 mg Oral QHS   Continuous Infusions: . dextrose 5 % and 0.45 % NaCl with KCl 20 mEq/L 75  mL/hr at 05/28/15 1911    Principal Problem:   Epigastric pain Active Problems:   Nausea and vomiting   Rheumatoid arthritis (HCC)   Thoracic compression fracture ([place])    Time spent: 74mins   Kathie Dike, MD   [facility] Pager [phone]. If 7PM-7AM, please contact night-coverage at [email or web address], password [facility] 05/29/2015, 7:20 AM  LOS: 1 day     By signing my name below, I, Delene Ruffini, attest that this documentation has been prepared under the direction and in the presence of Kathie Dike, MD. Electronically Signed: Delene Ruffini  05/29/2015 2:07pm  I, Dr. Kathie Dike, personally performed the services described in this documentaiton. All medical record entries made by the scribe were at my direction and in my presence. I have reviewed the chart and agree that the record reflects my personal performance and is accurate and complete  Kathie Dike, MD, 05/29/2015 2:19 PM

## 2015-05-30 DIAGNOSIS — R339 Retention of urine, unspecified: Secondary | ICD-10-CM

## 2015-05-30 LAB — CBC
HCT: 33.3 % — ABNORMAL LOW (ref 36.0–46.0)
Hemoglobin: 10.8 g/dL — ABNORMAL LOW (ref 12.0–15.0)
MCH: 25.8 pg — AB (ref 26.0–34.0)
MCHC: 32.4 g/dL (ref 30.0–36.0)
MCV: 79.7 fL (ref 78.0–100.0)
PLATELETS: 277 10*3/uL (ref 150–400)
RBC: 4.18 MIL/uL (ref 3.87–5.11)
RDW: 16.6 % — ABNORMAL HIGH (ref 11.5–15.5)
WBC: 5.4 10*3/uL (ref 4.0–10.5)

## 2015-05-30 LAB — BASIC METABOLIC PANEL
Anion gap: 10 (ref 5–15)
CALCIUM: 8.9 mg/dL (ref 8.9–10.3)
CO2: 27 mmol/L (ref 22–32)
CREATININE: 0.59 mg/dL (ref 0.44–1.00)
Chloride: 99 mmol/L — ABNORMAL LOW (ref 101–111)
GFR calc Af Amer: >60 mL/min (ref >60)
GFR calc non Af Amer: >60 mL/min (ref >60)
GLUCOSE: 112 mg/dL — AB (ref 65–99)
Potassium: 3.7 mmol/L (ref 3.5–5.1)
Sodium: 136 mmol/L (ref 135–145)

## 2015-05-30 MED ORDER — METOCLOPRAMIDE HCL 5 MG/ML IJ SOLN
5.0000 mg | Freq: Four times a day (QID) | INTRAMUSCULAR | Status: DC
Start: 2015-05-30 — End: 2015-05-31
  Administered 2015-05-31 (×3): 5 mg via INTRAVENOUS
  Filled 2015-05-30 (×3): qty 2

## 2015-05-30 MED ORDER — ONDANSETRON HCL 4 MG/2ML IJ SOLN
4.0000 mg | Freq: Four times a day (QID) | INTRAMUSCULAR | Status: DC
  Administered 2015-05-30 – 2015-06-02 (×12): 4 mg via INTRAVENOUS
  Filled 2015-05-30 (×11): qty 2

## 2015-05-30 MED ORDER — MILK AND MOLASSES ENEMA: 1.0000 | Freq: Once | RECTAL | Status: DC

## 2015-05-30 NOTE — Progress Notes (Signed)
[facility] PROGRESS NOTE  Jordan Haney [id number] DOB: [birth year] DOA: 05/27/2015 PCP: No PCP Per Patient  Assessment/Plan: 1. Abdominal pain with associated nausea and vomiting. Etiology unclear, though possibly related to large stool burden. On admission abd exam was found to be benign and labs unremarkable. Abdominal ultrasound was unrevealing. CT abdomen showed a large stool burden with urinary retention. There were no other acute findings. Continue on BID PPI for any element of gastritis. She is s/p cholecystectomy, has unremarkable LFTs and lipase. Will give enemas to aid with constipation. Advance diet as tolerated. Start scheduled zofran and reglan. 2. Urinary retention, possibly related to constipation. CT abd/pelvis revealed she has a distended urinary bladder and recommends clinical correlation for urinary retention. Bladder scan revealed pt had fair amount of residual urine, foley catheter inserted. 3. Anxiety. Continue home dose of xanax   Code Status: Full DVT prophylaxis:Lovenox  Family Communication: no family at bedside Disposition Plan: discharge home once improved   Consultants:  none  Procedures:  none  Antibiotics:  none  HPI/Subjective: Continues to have vomiting and epigastric pain. Unable to keep down liquids. Had bowel movement after enema last night.  Objective: Filed Vitals:   05/29/15 2225 05/30/15 0513  BP: 138/71 144/74  Pulse: 72 80  Temp: 98.9 F (37.2 C) 98.5 F (36.9 C)  Resp: 16 16    Intake/Output Summary (Last 24 hours) at 05/30/15 0726 Last data filed at 05/30/15 0600  Gross per 24 hour  Intake 3171.25 ml  Output   3000 ml  Net 171.25 ml   Filed Weights   05/27/15 1509 05/28/15 0003  Weight: 46.72 kg (103 lb) 48.626 kg (107 lb 3.2 oz)    Exam:  General: NAD, looks comfortable Cardiovascular: RRR, S1, S2  Respiratory: clear bilaterally, No wheezing, rales or rhonchi Abdomen: soft, diffusely tender, bowel  sounds normal Musculoskeletal: No edema b/l   Data Reviewed: Basic Metabolic Panel:  Recent Labs Lab 05/27/15 1543 05/28/15 0541  NA 141 145  K 3.7 3.9  CL 110 113*  CO2 24 24  GLUCOSE 89 89  BUN 8 5*  CREATININE 0.49 0.53  CALCIUM 8.7* 8.5*   Liver Function Tests:  Recent Labs Lab 05/27/15 1543 05/28/15 0541  AST 23 24  ALT 14 13*  ALKPHOS 159* 141*  BILITOT 0.2* 0.5  PROT 6.7 6.0*  ALBUMIN 3.5 3.1*    Recent Labs Lab 05/27/15 1543  LIPASE 24   No results for input(s): AMMONIA in the last 168 hours. CBC:  Recent Labs Lab 05/27/15 1543 05/28/15 0541  WBC 4.1 5.4  HGB 11.1* 10.6*  HCT 34.5* 34.1*  MCV 80.8 82.2  PLT 266 227   Cardiac Enzymes: No results for input(s): CKTOTAL, CKMB, CKMBINDEX, TROPONINI in the last 168 hours. BNP (last 3 results) No results for input(s): BNP in the last 8760 hours.  ProBNP (last 3 results) No results for input(s): PROBNP in the last 8760 hours.  CBG: No results for input(s): GLUCAP in the last 168 hours.  No results found for this or any previous visit (from the past 240 hour(s)).   Studies: Ct Abdomen Pelvis W Contrast  05/28/2015  CLINICAL DATA:  Abdominal pain. Nausea with vomiting. Prior cholecystectomy. EXAM: CT ABDOMEN AND PELVIS WITH CONTRAST TECHNIQUE: Multidetector CT imaging of the abdomen and pelvis was performed using the standard protocol following bolus administration of intravenous contrast. CONTRAST:  1103mL OMNIPAQUE IOHEXOL 300 MG/ML  SOLN COMPARISON:  08/18/2012 FINDINGS: Lower chest:  No acute  findings. Hepatobiliary: Severe hepatic steatosis again demonstrated. No liver masses identified. Prior cholecystectomy noted. No evidence of biliary dilatation. Pancreas: No mass, inflammatory changes, or other significant abnormality. Spleen: Within normal limits in size and appearance. Adrenals/Urinary Tract: No masses identified. No evidence of hydronephrosis. Small left renal cyst again noted. Distended  urinary bladder. Stomach/Bowel: No evidence of obstruction, inflammatory process, or abnormal fluid collections. Large colonic stool burden again noted. Vascular/Lymphatic: No pathologically enlarged lymph nodes. No evidence of abdominal aortic aneurysm. Reproductive: Prior hysterectomy noted. Adnexal regions are unremarkable in appearance. Other: Left hip prosthesis results in beam hardening artifact through the inferior aspect of the pelvis. Musculoskeletal: No suspicious bone lesions identified. 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 IMPRESSION: Distended urinary bladder ; recommend clinical correlation for urinary retention. No evidence hydronephrosis or other acute findings. Stable severe hepatic steatosis. Large stool burden noted; suggest clinical correlation for possible constipation. Electronically Signed   By: Earle Gell M.D.   On: 05/28/2015 21:39   US Abdomen Limited Ruq  05/28/2015  CLINICAL DATA:  Nausea and vomiting with abdominal pain for 3 days. History of prior cholecystectomy. EXAM: US ABDOMEN LIMITED - RIGHT UPPER QUADRANT COMPARISON:  08/18/2012 abdominal CT FINDINGS: Gallbladder: Cholecystectomy. Common bile duct: Diameter: Mildly dilated after cholecystectomy. Example 13 mm in the porta hepatis, tapering to 11 mm in the pancreatic head. New since 08/18/2012. Liver: Moderate hepatic steatosis.  No intrahepatic duct dilatation. IMPRESSION: 1. Cholecystectomy with mild common duct dilatation, maximally 13 mm. Consider correlation with bilirubin levels. If these are elevated, consider MRCP. 2. Moderate hepatic steatosis. Electronically Signed   By: Abigail Miyamoto M.D.   On: 05/28/2015 08:48    Scheduled Meds: . ALPRAZolam  0.5 mg Oral BID  . enoxaparin (LOVENOX) injection  40 mg Subcutaneous Q24H  . famotidine (PEPCID) IV  20 mg Intravenous Q12H  . pantoprazole (PROTONIX) IV  40 mg Intravenous Q12H  .  PARoxetine  40 mg Oral q morning - 10a  . zolpidem  5 mg Oral QHS   Continuous Infusions: . dextrose 5 % and 0.45 % NaCl with KCl 20 mEq/L 75 mL/hr at 05/29/15 2110    Principal Problem:   Epigastric pain Active Problems:   Nausea and vomiting   Rheumatoid arthritis ([place])   Thoracic compression fracture ([place])    Time spent: 42mins   Kathie Dike, MD   [facility] Pager [phone]. If 7PM-7AM, please contact night-coverage at [email or web address], password [facility] 05/30/2015, 7:26 AM  LOS: 2 days

## 2015-05-30 NOTE — Care Management Note (Signed)
Case Management Note  Patient Details  Name: NYKIRA FLANERY MRN: [id number] Date of Birth: October 11, 1955  Expected Discharge Date:     06/01/2015             Expected Discharge Plan:  Home/Self Care  In-House Referral:  NA  Discharge planning Services  CM Consult  Post Acute Care Choice:  NA Choice offered to:  NA  DME Arranged:    DME Agency:     HH Arranged:    [place] Agency:     Status of Service:  Completed, signed off  Medicare Important Message Given:    Date Medicare IM Given:    Medicare IM give by:    Date Additional Medicare IM Given:    Additional Medicare Important Message give by:     If discussed at [place] of Stay Meetings, dates discussed:    Additional Comments: [place] home over weekend. No CM needs anticipated.  Sherald Barge, RN 05/30/2015, 2:41 PM

## 2015-05-31 DIAGNOSIS — K5909 Other constipation: Secondary | ICD-10-CM

## 2015-05-31 MED ORDER — METOCLOPRAMIDE HCL 5 MG/ML IJ SOLN
10.0000 mg | Freq: Four times a day (QID) | INTRAMUSCULAR | Status: DC
  Administered 2015-05-31 – 2015-06-02 (×8): 10 mg via INTRAVENOUS
  Filled 2015-05-31 (×8): qty 2

## 2015-05-31 MED ORDER — POLYETHYLENE GLYCOL 3350 17 G PO PACK
17.0000 g | PACK | Freq: Two times a day (BID) | ORAL | Status: DC
  Administered 2015-05-31 – 2015-06-02 (×4): 17 g via ORAL
  Filled 2015-05-31 (×4): qty 1

## 2015-05-31 MED ORDER — SENNA 8.6 MG PO TABS
2.0000 | ORAL_TABLET | Freq: Every day | ORAL | Status: DC
  Administered 2015-06-01 – 2015-06-02 (×2): 17.2 mg via ORAL
  Filled 2015-05-31 (×2): qty 2

## 2015-05-31 NOTE — Progress Notes (Signed)
[facility] PROGRESS NOTE  Jordan Haney [id number] DOB: Oct 24, 1955 DOA: 05/27/2015 PCP: No PCP Per Patient  Assessment/Plan: 1. Abdominal pain with associated nausea and vomiting. Etiology unclear, though possibly related to large stool burden. On admission abd exam was found to be benign and labs unremarkable. Abdominal ultrasound was unrevealing. CT abdomen showed a large stool burden with urinary retention. There were no other acute findings. She has been receiving daily enemas with good stool output. She feels that her symptoms may be improving including her nausea and abd pain. Will start her on a PO bowel regimen. Continue on BID PPI for any element of gastritis. She is s/p cholecystectomy, has unremarkable LFTs and lipase. Advance diet to full liquids. Continue scheduled zofran and reglan. 2. Urinary retention, possibly related to constipation. CT abd/pelvis revealed she has a distended urinary bladder and recommended clinical correlation for urinary retention. Bladder scan revealed pt had fair amount of residual urine, foley catheter inserted, consider voiding trial tomorrow if pt continues to improve. 3. Anxiety. Continue home dose of xanax   Code Status: Full DVT prophylaxis:Lovenox  Family Communication: no family at bedside Disposition Plan: discharge home once improved   Consultants:  none  Procedures:  none  Antibiotics:  none  HPI/Subjective: Feels better. Enema helped produce BM. Only threw up once today, is still feeling nauseous and experiencing pain. Abd no longer feels distended.  Objective: Filed Vitals:   05/30/15 2123 05/31/15 0526  BP: 147/92 130/88  Pulse: 66 71  Temp: 99 F (37.2 C) 98.9 F (37.2 C)  Resp: 18 20    Intake/Output Summary (Last 24 hours) at 05/31/15 0806 Last data filed at 05/31/15 0526  Gross per 24 hour  Intake    480 ml  Output   2650 ml  Net  -2170 ml   Filed Weights   05/27/15 1509 05/28/15 0003  Weight:  46.72 kg (103 lb) 48.626 kg (107 lb 3.2 oz)    Exam:  General: NAD, looks comfortable Cardiovascular: RRR, S1, S2  Respiratory: clear bilaterally, No wheezing, rales or rhonchi Abdomen: soft, non tender, no distention , bowel sounds normal Musculoskeletal: No edema b/l   Data Reviewed: Basic Metabolic Panel:  Recent Labs Lab 05/27/15 1543 05/28/15 0541 05/30/15 0611  NA 141 145 136  K 3.7 3.9 3.7  CL 110 113* 99*  CO2 24 24 27   GLUCOSE 89 89 112*  BUN 8 5* <5*  CREATININE 0.49 0.53 0.59  CALCIUM 8.7* 8.5* 8.9   Liver Function Tests:  Recent Labs Lab 05/27/15 1543 05/28/15 0541  AST 23 24  ALT 14 13*  ALKPHOS 159* 141*  BILITOT 0.2* 0.5  PROT 6.7 6.0*  ALBUMIN 3.5 3.1*    Recent Labs Lab 05/27/15 1543  LIPASE 24   No results for input(s): AMMONIA in the last 168 hours. CBC:  Recent Labs Lab 05/27/15 1543 05/28/15 0541 05/30/15 0611  WBC 4.1 5.4 5.4  HGB 11.1* 10.6* 10.8*  HCT 34.5* 34.1* 33.3*  MCV 80.8 82.2 79.7  PLT 266 227 277   Cardiac Enzymes: No results for input(s): CKTOTAL, CKMB, CKMBINDEX, TROPONINI in the last 168 hours. BNP (last 3 results) No results for input(s): BNP in the last 8760 hours.  ProBNP (last 3 results) No results for input(s): PROBNP in the last 8760 hours.  CBG: No results for input(s): GLUCAP in the last 168 hours.  No results found for this or any previous visit (from the past 240 hour(s)).   Studies: No  results found.  Scheduled Meds: . ALPRAZolam  0.5 mg Oral BID  . enoxaparin (LOVENOX) injection  40 mg Subcutaneous Q24H  . famotidine (PEPCID) IV  20 mg Intravenous Q12H  . metoCLOPramide (REGLAN) injection  5 mg Intravenous 4 times per day  . milk and molasses  1 enema Rectal Once  . ondansetron (ZOFRAN) IV  4 mg Intravenous 4 times per day  . pantoprazole (PROTONIX) IV  40 mg Intravenous Q12H  . PARoxetine  40 mg Oral q morning - 10a  . zolpidem  5 mg Oral QHS   Continuous Infusions: . dextrose 5  % and 0.45 % NaCl with KCl 20 mEq/L 75 mL/hr at 05/30/15 1811    Principal Problem:   Epigastric pain Active Problems:   Nausea and vomiting   Rheumatoid arthritis (HCC)   Thoracic compression fracture ([place])    Time spent: 79mins   Kathie Dike, MD   [facility] Pager [phone]. If 7PM-7AM, please contact night-coverage at [email or web address], password [facility] 05/31/2015, 8:06 AM  LOS: 3 days     By signing my name below, I, Delene Ruffini, attest that this documentation has been prepared under the direction and in the presence of Kathie Dike, MD. Electronically Signed: Delene Ruffini  05/31/2015 2:35pm  I, Dr. Kathie Dike, personally performed the services described in this documentaiton. All medical record entries made by the scribe were at my direction and in my presence. I have reviewed the chart and agree that the record reflects my personal performance and is accurate and complete  Kathie Dike, MD, 05/31/2015 2:43 PM

## 2015-06-01 NOTE — Progress Notes (Signed)
[facility] PROGRESS NOTE  Jordan Haney [id number] DOB: 1956-03-18 DOA: 05/27/2015 PCP: No PCP Per Patient  Assessment/Plan: 1. Abdominal pain with associated nausea and vomiting. Etiology unclear, though possibly related to large stool burden. On admission abd exam was found to be benign and labs unremarkable. Abdominal ultrasound was unrevealing. CT abdomen showed a large stool burden with urinary retention. There were no other acute findings. She has been receiving daily enemas with good stool output. She feels that her symptoms may be improving. Will continue PO bowel regimen and BID PPI for any element of gastritis. She is s/p cholecystectomy, has unremarkable LFTs and lipase. Advance diet to soft foods. Continue scheduled zofran and reglan. 2. Urinary retention, possibly related to constipation. CT abd/pelvis revealed she has a distended urinary bladder and recommended clinical correlation for urinary retention. Bladder scan revealed pt had fair amount of residual urine so foley catheter was inserted with improvement. Will attempt voiding trial today. 3. Anxiety. Continue home dose of xanax   Code Status: Full DVT prophylaxis:Lovenox  Family Communication: no family at bedside Disposition Plan: discharge home once improved   Consultants:  none  Procedures:  none  Antibiotics:  none  HPI/Subjective: Still nauseous but has not thrown up. Abdominal pain is unchanged and she does not have much of an appetite. Has been having BMs following her enema.   Objective: Filed Vitals:   05/31/15 2053 06/01/15 0543  BP: 132/83 126/92  Pulse: 72 60  Temp: 99.2 F (37.3 C) 98.7 F (37.1 C)  Resp: 18 18    Intake/Output Summary (Last 24 hours) at 06/01/15 0805 Last data filed at 06/01/15 0600  Gross per 24 hour  Intake    480 ml  Output   2800 ml  Net  -2320 ml   Filed Weights   05/27/15 1509 05/28/15 0003  Weight: 46.72 kg (103 lb) 48.626 kg (107 lb 3.2 oz)     Exam:   General: NAD, looks comfortable  Cardiovascular: RRR, S1, S2   Respiratory: clear bilaterally, No wheezing, rales or rhonchi  Abdomen: soft, non tender, no distention , bowel sounds normal  Musculoskeletal: No edema b/l  Data Reviewed: Basic Metabolic Panel:  Recent Labs Lab 05/27/15 1543 05/28/15 0541 05/30/15 0611  NA 141 145 136  K 3.7 3.9 3.7  CL 110 113* 99*  CO2 24 24 27   GLUCOSE 89 89 112*  BUN 8 5* <5*  CREATININE 0.49 0.53 0.59  CALCIUM 8.7* 8.5* 8.9   Liver Function Tests:  Recent Labs Lab 05/27/15 1543 05/28/15 0541  AST 23 24  ALT 14 13*  ALKPHOS 159* 141*  BILITOT 0.2* 0.5  PROT 6.7 6.0*  ALBUMIN 3.5 3.1*    Recent Labs Lab 05/27/15 1543  LIPASE 24    CBC:  Recent Labs Lab 05/27/15 1543 05/28/15 0541 05/30/15 0611  WBC 4.1 5.4 5.4  HGB 11.1* 10.6* 10.8*  HCT 34.5* 34.1* 33.3*  MCV 80.8 82.2 79.7  PLT 266 227 277     Scheduled Meds: . ALPRAZolam  0.5 mg Oral BID  . enoxaparin (LOVENOX) injection  40 mg Subcutaneous Q24H  . metoCLOPramide (REGLAN) injection  10 mg Intravenous 4 times per day  . milk and molasses  1 enema Rectal Once  . ondansetron (ZOFRAN) IV  4 mg Intravenous 4 times per day  . pantoprazole (PROTONIX) IV  40 mg Intravenous Q12H  . PARoxetine  40 mg Oral q morning - 10a  . polyethylene glycol  17 g  Oral BID  . senna  2 tablet Oral Daily  . zolpidem  5 mg Oral QHS   Continuous Infusions: . dextrose 5 % and 0.45 % NaCl with KCl 20 mEq/L 75 mL/hr at 05/31/15 1012    Principal Problem:   Epigastric pain Active Problems:   Nausea and vomiting   Rheumatoid arthritis (HCC)   Thoracic compression fracture ([place])    Time spent: 25 mins   Kathie Dike, MD   [facility] Pager [phone]. If 7PM-7AM, please contact night-coverage at [email or web address], password [facility] 06/01/2015, 8:05 AM  LOS: 4 days     By signing my name below, I, Rosalie Doctor, attest that this documentation has been  prepared under the direction and in the presence of [facility]. MD Electronically Signed: Rosalie Doctor, Scribe. 06/01/2015  I, Dr. Kathie Dike, personally performed the services described in this documentaiton. All medical record entries made by the scribe were at my direction and in my presence. I have reviewed the chart and agree that the record reflects my personal performance and is accurate and complete  Kathie Dike, MD, 06/01/2015 3:25 PM

## 2015-06-01 NOTE — Plan of Care (Signed)
Problem: Bowel/Gastric: Goal: Will not experience complications related to bowel motility Outcome: Progressing Foley Discontinue. Will monitor output of patient and bladder scan if necessary

## 2015-06-02 DIAGNOSIS — K5900 Constipation, unspecified: Secondary | ICD-10-CM | POA: Diagnosis present

## 2015-06-02 DIAGNOSIS — R339 Retention of urine, unspecified: Secondary | ICD-10-CM | POA: Diagnosis present

## 2015-06-02 MED ORDER — HYDROCODONE-ACETAMINOPHEN 5-325 MG PO TABS: 1.0000 | ORAL_TABLET | Freq: Four times a day (QID) | ORAL | Status: DC | PRN

## 2015-06-02 MED ORDER — SENNA 8.6 MG PO TABS: 2.0000 | ORAL_TABLET | Freq: Two times a day (BID) | ORAL | Status: DC

## 2015-06-02 MED ORDER — POLYETHYLENE GLYCOL 3350 17 G PO PACK: 17.0000 g | PACK | Freq: Two times a day (BID) | ORAL | Status: DC

## 2015-06-02 MED ORDER — ONDANSETRON 4 MG PO TBDP: 4.0000 mg | ORAL_TABLET | Freq: Four times a day (QID) | ORAL | Status: DC | PRN

## 2015-06-02 MED ORDER — METOCLOPRAMIDE HCL 10 MG PO TABS: 10.0000 mg | ORAL_TABLET | Freq: Three times a day (TID) | ORAL | Status: DC

## 2015-06-02 MED ORDER — PANTOPRAZOLE SODIUM 40 MG PO TBEC
40.0000 mg | DELAYED_RELEASE_TABLET | Freq: Two times a day (BID) | ORAL | Status: DC
  Administered 2015-06-02: 40 mg via ORAL
  Filled 2015-06-02: qty 1

## 2015-06-02 MED ORDER — PANTOPRAZOLE SODIUM 40 MG PO TBEC: 40.0000 mg | DELAYED_RELEASE_TABLET | Freq: Two times a day (BID) | ORAL | Status: AC

## 2015-06-02 NOTE — Discharge Summary (Signed)
Physician Discharge Summary  Jordan Haney [id number] DOB: 1955-08-07 DOA: 05/27/2015  PCP: [name]  Admit date: 05/27/2015 Discharge date: 06/02/2015  Time spent: 35 minutes  Recommendations for Outpatient Follow-up:  1. Follow up with PCP in 1-2 weeks.   Discharge Diagnoses:  Principal Problem:   Epigastric pain Active Problems:   Nausea and vomiting   Constipation   Urinary retention   Discharge Condition: Improved   Diet recommendation: Heart healthy   Filed Weights   05/27/15 1509 05/28/15 0003  Weight: 46.72 kg (103 lb) 48.626 kg (107 lb 3.2 oz)    History of present illness:  74 yof with hx of PUD, pancreatitis and cholecystectomy presented with complaints of severe intermittent epigastric abd pain onset 3/5 with associated N/V. Pt reports a transient fever that subsided 3/6. Denies black or bloody stool and had BM this morning. She has not taken any NSAIDS. While in the ED, workup revealed Lipase 24, AST 23 and ALT 14. Total Bili 0.2. WBC 4.1. VSS. Hospitalist was asked to for symptomatic control since pain persists despite pain medicaton as well as related nausea and vomiting.   Hospital Course:  Patient was admitted for abdominal pain with associated nausea and vomiting. Etiology possibly related to constipation vs. GERD. On admission abd exam was found to be benign and labs unremarkable. Abdominal ultrasound was unrevealing. CT abdomen showed a large stool burden in the colon with urinary retention. There were no other acute findings. She received daily enemas with good stool output.  Continued on PO bowel regimen and BID PPI for any element of gastritis. She is s/p cholecystectomy, has unremarkable LFTs and lipase. On discharge she reported mild abdominal pain; but nausea and vomiting resolved, she was able to eat without difficulty, and she was having bowel movements.  1. Urinary retention, possibly related to constipation. CT abd/pelvis revealed she  has a distended urinary bladder and recommended clinical correlation for urinary retention. Bladder scan revealed pt had fair amount of residual urine so foley catheter was inserted with improvement. 3/12 catheter was removed and she was able to void without issues. Repeat bladder scan on discharge did not show significant post void residual 2. Anxiety. Continue home dose of xanax  Procedures:  None  Consultations:  None  Discharge Exam: Filed Vitals:   06/02/15 0530 06/02/15 1534  BP: 152/84 132/76  Pulse: 69 69  Temp: 98.6 F (37 C) 98 F (36.7 C)  Resp: 18 16    General: NAD, looks comfortable Cardiovascular: RRR, S1, S2  Respiratory: clear bilaterally, No wheezing, rales or rhonchi Abdomen: soft, non tender, no distention , bowel sounds normal Musculoskeletal: No edema b/l  Discharge Instructions   Discharge Instructions    Diet - low sodium heart healthy    Complete by:  As directed      Increase activity slowly    Complete by:  As directed           Current Discharge Medication List    START taking these medications   Details  ondansetron (ZOFRAN ODT) 4 MG disintegrating tablet Take 1 tablet (4 mg total) by mouth every 6 (six) hours as needed for nausea or vomiting. Qty: 30 tablet, Refills: 1    pantoprazole (PROTONIX) 40 MG tablet Take 1 tablet (40 mg total) by mouth 2 (two) times daily. Qty: 60 tablet, Refills: 1    polyethylene glycol (MIRALAX / GLYCOLAX) packet Take 17 g by mouth 2 (two) times daily. Qty: 30 each, Refills: 0  senna (SENOKOT) 8.6 MG TABS tablet Take 2 tablets (17.2 mg total) by mouth 2 (two) times daily. Qty: 120 each, Refills: 0      CONTINUE these medications which have CHANGED   Details  HYDROcodone-acetaminophen (NORCO/VICODIN) 5-325 MG tablet Take 1-2 tablets by mouth every 6 (six) hours as needed for severe pain. Qty: 30 tablet, Refills: 0    metoCLOPramide (REGLAN) 10 MG tablet Take 1 tablet (10 mg total) by mouth 3  (three) times daily before meals. Qty: 90 tablet, Refills: 1      CONTINUE these medications which have NOT CHANGED   Details  ALPRAZolam (XANAX) 0.5 MG tablet Take 0.5 mg by mouth 2 (two) times daily.    aspirin EC 325 MG tablet Take 650 mg by mouth once as needed for mild pain or moderate pain.    calcium gluconate 500 MG tablet Take 1 tablet by mouth daily.    PARoxetine (PAXIL) 40 MG tablet Take 40 mg by mouth every morning.    Vitamin D, Ergocalciferol, (DRISDOL) 50000 units CAPS capsule Take 50,000 Units by mouth 2 (two) times a week.    zolpidem (AMBIEN) 5 MG tablet Take 5 mg by mouth at bedtime.       STOP taking these medications     lidocaine (LIDODERM) 5 %        Allergies  Allergen Reactions  . Compazine Anaphylaxis  . Phenergan [Promethazine] Anaphylaxis  . Vistaril [Hydroxyzine Hcl] Other (See Comments)    Reaction: legs shake  . Benadryl [Diphenhydramine Hcl] Other (See Comments)    Pt states, "it makes my legs shake really bad"   . Gabapentin Other (See Comments)    Reactions: legs shake  . Latex Swelling      The results of significant diagnostics from this hospitalization (including imaging, microbiology, ancillary and laboratory) are listed below for reference.    Significant Diagnostic Studies: Ct Abdomen Pelvis W Contrast  05/28/2015  CLINICAL DATA:  Abdominal pain. Nausea with vomiting. Prior cholecystectomy. EXAM: CT ABDOMEN AND PELVIS WITH CONTRAST TECHNIQUE: Multidetector CT imaging of the abdomen and pelvis was performed using the standard protocol following bolus administration of intravenous contrast. CONTRAST:  154mL OMNIPAQUE IOHEXOL 300 MG/ML  SOLN COMPARISON:  08/18/2012 FINDINGS: Lower chest:  No acute findings. Hepatobiliary: Severe hepatic steatosis again demonstrated. No liver masses identified. Prior cholecystectomy noted. No evidence of biliary dilatation. Pancreas: No mass, inflammatory changes, or other significant abnormality.  Spleen: Within normal limits in size and appearance. Adrenals/Urinary Tract: No masses identified. No evidence of hydronephrosis. Small left renal cyst again noted. Distended urinary bladder. Stomach/Bowel: No evidence of obstruction, inflammatory process, or abnormal fluid collections. Large colonic stool burden again noted. Vascular/Lymphatic: No pathologically enlarged lymph nodes. No evidence of abdominal aortic aneurysm. Reproductive: Prior hysterectomy noted. Adnexal regions are unremarkable in appearance. Other: Left hip prosthesis results in beam hardening artifact through the inferior aspect of the pelvis. Musculoskeletal: No suspicious bone lesions identified. 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 IMPRESSION: Distended urinary bladder ; recommend clinical correlation for urinary retention. No evidence hydronephrosis or other acute findings. Stable severe hepatic steatosis. Large stool burden noted; suggest clinical correlation for possible constipation. Electronically Signed   By: Earle Gell M.D.   On: 05/28/2015 21:39   US Abdomen Limited Ruq  05/28/2015  CLINICAL DATA:  Nausea and vomiting with abdominal pain for 3 days. History of prior cholecystectomy. EXAM: US ABDOMEN LIMITED -  RIGHT UPPER QUADRANT COMPARISON:  08/18/2012 abdominal CT FINDINGS: Gallbladder: Cholecystectomy. Common bile duct: Diameter: Mildly dilated after cholecystectomy. Example 13 mm in the porta hepatis, tapering to 11 mm in the pancreatic head. New since 08/18/2012. Liver: Moderate hepatic steatosis.  No intrahepatic duct dilatation. IMPRESSION: 1. Cholecystectomy with mild common duct dilatation, maximally 13 mm. Consider correlation with bilirubin levels. If these are elevated, consider MRCP. 2. Moderate hepatic steatosis. Electronically Signed   By: Abigail Miyamoto M.D.   On: 05/28/2015 08:48    Microbiology: No results found for this or  any previous visit (from the past 240 hour(s)).   Labs: Basic Metabolic Panel:  Recent Labs Lab 05/27/15 1543 05/28/15 0541 05/30/15 0611  NA 141 145 136  K 3.7 3.9 3.7  CL 110 113* 99*  CO2 24 24 27   GLUCOSE 89 89 112*  BUN 8 5* <5*  CREATININE 0.49 0.53 0.59  CALCIUM 8.7* 8.5* 8.9   Liver Function Tests:  Recent Labs Lab 05/27/15 1543 05/28/15 0541  AST 23 24  ALT 14 13*  ALKPHOS 159* 141*  BILITOT 0.2* 0.5  PROT 6.7 6.0*  ALBUMIN 3.5 3.1*    Recent Labs Lab 05/27/15 1543  LIPASE 24   CBC:  Recent Labs Lab 05/27/15 1543 05/28/15 0541 05/30/15 0611  WBC 4.1 5.4 5.4  HGB 11.1* 10.6* 10.8*  HCT 34.5* 34.1* 33.3*  MCV 80.8 82.2 79.7  PLT 266 227 277    Signed:  Kathie Dike, MD   [facility] 06/02/2015, 4:11 PM

## 2015-06-08 ENCOUNTER — Encounter (HOSPITAL_COMMUNITY): Payer: Self-pay | Admitting: *Deleted

## 2015-06-08 ENCOUNTER — Inpatient Hospital Stay (HOSPITAL_COMMUNITY)
Admission: EM | Admit: 2015-06-08 | Discharge: 2015-06-17 | DRG: 392 | Disposition: A | Discharge status: Home / Self Care | Attending: Internal Medicine | Admitting: Internal Medicine

## 2015-06-08 DIAGNOSIS — K760 Fatty (change of) liver, not elsewhere classified: Secondary | ICD-10-CM | POA: Diagnosis present

## 2015-06-08 DIAGNOSIS — R112 Nausea with vomiting, unspecified: Secondary | ICD-10-CM | POA: Diagnosis present

## 2015-06-08 DIAGNOSIS — R1110 Vomiting, unspecified: Secondary | ICD-10-CM

## 2015-06-08 DIAGNOSIS — Z9884 Bariatric surgery status: Secondary | ICD-10-CM

## 2015-06-08 DIAGNOSIS — K838 Other specified diseases of biliary tract: Secondary | ICD-10-CM | POA: Diagnosis not present

## 2015-06-08 DIAGNOSIS — M069 Rheumatoid arthritis, unspecified: Secondary | ICD-10-CM | POA: Diagnosis not present

## 2015-06-08 DIAGNOSIS — I10 Essential (primary) hypertension: Secondary | ICD-10-CM | POA: Diagnosis not present

## 2015-06-08 DIAGNOSIS — Z79899 Other long term (current) drug therapy: Secondary | ICD-10-CM

## 2015-06-08 DIAGNOSIS — D649 Anemia, unspecified: Secondary | ICD-10-CM | POA: Diagnosis present

## 2015-06-08 DIAGNOSIS — Z903 Acquired absence of stomach [part of]: Secondary | ICD-10-CM | POA: Diagnosis not present

## 2015-06-08 DIAGNOSIS — N390 Urinary tract infection, site not specified: Secondary | ICD-10-CM | POA: Diagnosis present

## 2015-06-08 DIAGNOSIS — Z96642 Presence of left artificial hip joint: Secondary | ICD-10-CM | POA: Diagnosis not present

## 2015-06-08 DIAGNOSIS — Z8249 Family history of ischemic heart disease and other diseases of the circulatory system: Secondary | ICD-10-CM

## 2015-06-08 DIAGNOSIS — F40240 Claustrophobia: Secondary | ICD-10-CM | POA: Diagnosis not present

## 2015-06-08 DIAGNOSIS — R1013 Epigastric pain: Secondary | ICD-10-CM

## 2015-06-08 DIAGNOSIS — R109 Unspecified abdominal pain: Secondary | ICD-10-CM

## 2015-06-08 DIAGNOSIS — Z8711 Personal history of peptic ulcer disease: Secondary | ICD-10-CM | POA: Diagnosis not present

## 2015-06-08 DIAGNOSIS — Z9049 Acquired absence of other specified parts of digestive tract: Secondary | ICD-10-CM | POA: Diagnosis not present

## 2015-06-08 DIAGNOSIS — R1012 Left upper quadrant pain: Secondary | ICD-10-CM

## 2015-06-08 DIAGNOSIS — Z90710 Acquired absence of both cervix and uterus: Secondary | ICD-10-CM

## 2015-06-08 DIAGNOSIS — B952 Enterococcus as the cause of diseases classified elsewhere: Secondary | ICD-10-CM | POA: Diagnosis not present

## 2015-06-08 DIAGNOSIS — R1011 Right upper quadrant pain: Secondary | ICD-10-CM | POA: Diagnosis present

## 2015-06-08 DIAGNOSIS — Z87442 Personal history of urinary calculi: Secondary | ICD-10-CM

## 2015-06-08 HISTORY — DX: Essential (primary) hypertension: I10

## 2015-06-08 LAB — URINALYSIS, ROUTINE W REFLEX MICROSCOPIC
Bilirubin Urine: NEGATIVE
Glucose, UA: NEGATIVE mg/dL
KETONES UR: NEGATIVE mg/dL
NITRITE: NEGATIVE
PROTEIN: NEGATIVE mg/dL
Specific Gravity, Urine: <1.005 — ABNORMAL LOW (ref 1.005–1.030)
pH: 5.5 (ref 5.0–8.0)

## 2015-06-08 LAB — COMPREHENSIVE METABOLIC PANEL
ALT: 13 U/L — ABNORMAL LOW (ref 14–54)
AST: 24 U/L (ref 15–41)
Albumin: 3.7 g/dL (ref 3.5–5.0)
Alkaline Phosphatase: 155 U/L — ABNORMAL HIGH (ref 38–126)
Anion gap: 8 (ref 5–15)
BUN: 12 mg/dL (ref 6–20)
CO2: 27 mmol/L (ref 22–32)
Calcium: 9.1 mg/dL (ref 8.9–10.3)
Chloride: 104 mmol/L (ref 101–111)
Creatinine, Ser: 0.57 mg/dL (ref 0.44–1.00)
GFR calc Af Amer: >60 mL/min (ref >60)
GFR calc non Af Amer: >60 mL/min (ref >60)
Glucose, Bld: 90 mg/dL (ref 65–99)
Potassium: 3.5 mmol/L (ref 3.5–5.1)
Sodium: 139 mmol/L (ref 135–145)
Total Bilirubin: 0.5 mg/dL (ref 0.3–1.2)
Total Protein: 6.7 g/dL (ref 6.5–8.1)

## 2015-06-08 LAB — CBC WITH DIFFERENTIAL/PLATELET
Basophils Absolute: 0.1 10*3/uL (ref 0.0–0.1)
Basophils Relative: 1 %
Eosinophils Absolute: 0.1 10*3/uL (ref 0.0–0.7)
Eosinophils Relative: 1 %
HCT: 38.8 % (ref 36.0–46.0)
Hemoglobin: 12.3 g/dL (ref 12.0–15.0)
Lymphocytes Relative: 30 %
Lymphs Abs: 1.8 10*3/uL (ref 0.7–4.0)
MCH: 25.4 pg — ABNORMAL LOW (ref 26.0–34.0)
MCHC: 31.7 g/dL (ref 30.0–36.0)
MCV: 80.2 fL (ref 78.0–100.0)
Monocytes Absolute: 0.5 10*3/uL (ref 0.1–1.0)
Monocytes Relative: 8 %
Neutro Abs: 3.5 10*3/uL (ref 1.7–7.7)
Neutrophils Relative %: 60 %
Platelets: 416 10*3/uL — ABNORMAL HIGH (ref 150–400)
RBC: 4.84 MIL/uL (ref 3.87–5.11)
RDW: 16.5 % — ABNORMAL HIGH (ref 11.5–15.5)
WBC: 5.9 10*3/uL (ref 4.0–10.5)

## 2015-06-08 LAB — URINE MICROSCOPIC-ADD ON

## 2015-06-08 LAB — LIPASE, BLOOD: Lipase: 33 U/L (ref 11–51)

## 2015-06-08 MED ORDER — DEXTROSE 5 % IV SOLN
1.0000 g | Freq: Once | INTRAVENOUS | Status: AC
  Administered 2015-06-08: 1 g via INTRAVENOUS
  Filled 2015-06-08: qty 10

## 2015-06-08 MED ORDER — KETOROLAC TROMETHAMINE 30 MG/ML IJ SOLN: 30.0000 mg | Freq: Once | INTRAMUSCULAR | Status: DC

## 2015-06-08 MED ORDER — KETOROLAC TROMETHAMINE 30 MG/ML IJ SOLN
30.0000 mg | Freq: Once | INTRAMUSCULAR | Status: AC
  Administered 2015-06-08: 30 mg via INTRAVENOUS
  Filled 2015-06-08: qty 1

## 2015-06-08 MED ORDER — SODIUM CHLORIDE 0.9 % IV BOLUS (SEPSIS)
1000.0000 mL | Freq: Once | INTRAVENOUS | Status: AC
  Administered 2015-06-08: 1000 mL via INTRAVENOUS

## 2015-06-08 MED ORDER — FAMOTIDINE IN NACL 20-0.9 MG/50ML-% IV SOLN
20.0000 mg | Freq: Once | INTRAVENOUS | Status: AC
  Administered 2015-06-09: 20 mg via INTRAVENOUS
  Filled 2015-06-08: qty 50

## 2015-06-08 MED ORDER — PANTOPRAZOLE SODIUM 40 MG IV SOLR
40.0000 mg | Freq: Once | INTRAVENOUS | Status: AC
  Administered 2015-06-09: 40 mg via INTRAVENOUS
  Filled 2015-06-08: qty 40

## 2015-06-08 MED ORDER — ONDANSETRON HCL 4 MG/2ML IJ SOLN
4.0000 mg | Freq: Once | INTRAMUSCULAR | Status: AC
  Administered 2015-06-09: 4 mg via INTRAVENOUS
  Filled 2015-06-08: qty 2

## 2015-06-08 MED ORDER — ONDANSETRON 4 MG PO TBDP: 4.0000 mg | ORAL_TABLET | Freq: Once | ORAL | Status: DC

## 2015-06-08 MED ORDER — ONDANSETRON HCL 4 MG/2ML IJ SOLN
4.0000 mg | Freq: Once | INTRAMUSCULAR | Status: AC
  Administered 2015-06-08: 4 mg via INTRAVENOUS
  Filled 2015-06-08: qty 2

## 2015-06-08 MED ORDER — HYDROMORPHONE HCL 1 MG/ML IJ SOLN
1.0000 mg | Freq: Once | INTRAMUSCULAR | Status: AC
  Administered 2015-06-08: 1 mg via INTRAVENOUS
  Filled 2015-06-08: qty 1

## 2015-06-08 NOTE — ED Notes (Signed)
Called vascular wellness for PICC insertion with Dr Thurnell Garbe permission.

## 2015-06-08 NOTE — ED Provider Notes (Signed)
CSN: [id number]     Arrival date & time 06/08/15  1501 History   First MD Initiated Contact with Patient 06/08/15 1711     Chief Complaint  Patient presents with  . Abdominal Pain     (Consider location/radiation/quality/duration/timing/severity/associated sxs/prior Treatment) HPI   Jordan Haney is a 60 y.o. female with a history of Rheumatoid Arthritis, Chronic ABD Pain,PUD, and HTN who presented to the ED with complaints of severe 8-9/10 sharp Epigastric ABD pain with Nausea and Vomiting x 2 days. She denies any fevers or chills or diarrhea and also denies having any hematemesis, hematochezia or melena. She reports that she has not been able to hold down foods or liquids during this time. She was hospitalized from 03/7- 03/13 for similar symptoms and had an US of the ABD, and A CTscan of the ABD without acute findings.  Past Medical History  Diagnosis Date  . RA (rheumatoid arthritis) ([place])  2008  . Constipation 03/21/2011  . Hypokalemia 03/21/2011  . Fatty liver 03/21/2011  . Pancreatitis     states 3 years ago, very severe, ?biliary pancreatitis   . T12 compression fracture ([place]) 2011  . Duodenal ulcer     remote per patient. +BC powders, patient report negative EGD six months ago  . Dilated pancreatic duct ([place])     ?chronic pancreatitis, EUS pending (06/02/11)  . Kidney stones   . Chronic abdominal pain   . Pneumonia     as child  . Anemia   . Lumbar stress fracture   . Anxiety    Past Surgical History  Procedure Laterality Date  . Cholecystectomy    . Knee surgery    . Appendectomy    . Complete hysterectomy    . Ventral wall hernia    . Esophagogastroduodenoscopy  08/2010    Dr. Earley Brooke, Versed. small hh, mild prepylori gastritis. path unavailable  . Esophagogastroduodenoscopy  04/2010    Dr. Earley Brooke, Versed. small hh, mild distal esophagitis, antral gastritis and duodenitis. Bx mild chronic gastritis and no H.pylori  . Esophagogastroduodenoscopy  08/2009     Dr. Posey Pronto, Versed. Moderate gastritis, moderate duodenitis with nodularity in proximal duodenal bulb, bx chronic gastritis, no helicobacter, mild chronic duodenitis  . Esophagogastroduodenoscopy  02/2007    Dr. Earley Brooke, Versed. antral gastritis and duodenal ulcers. Bx mild chronic gastritis. No bx from duodenal ulcer available.  [name] dilation  04/06/2011    Procedure: SAVORY DILATION;  Surgeon: Dorothyann Peng, MD;  Location: [facility];  Service: Endoscopy;  Laterality: N/A;  16 mm  . Esophagogastroduodenoscopy  04/06/11    Dr. Oneida Alar: 1-2 cm hiatal hernia, mod gastritis, 44mmX3mm linear ulcer in duodenal bulb, stricture 1st part of duodenum, dilated to 12 mm  . Esophagogastroduodenoscopy  05/12/11    partial gastric oulet obstruction secondary to stricture between bulb/2nd portion of duodenum with marked friability and inflammation but no discrete ulcer, dilated stomach. s/p dilation but high risk for restenosis  . Esophagogastroduodenoscopy  06/22/2011    [id number] ring/Moderate gastritis/PERSISTENT Ulcer in the bulb/descending duodenum Stricture in the bulb/descending duodenum  . Gastrojejunostomy  06/28/2011    Procedure: GASTROJEJUNOSTOMY;  Surgeon: Donato Heinz, MD;  Location: [facility];  Service: General;  Laterality: N/A;  Roux-en-y, Choledocalduodenostomy  . Abdominal hysterectomy    . Kidney stone surgery    . Esophagogastroduodenoscopy Left 06/25/2012    RMR: Normal esophagus. Surgically altered anastomosis to  small intestine noted. Couple of  at the anastomosis.  Patent efferent limb Patient  had an oozing 1 cm anastomotic ulcer  . Back surgery  2013  . Colonoscopy N/A 08/16/2012    Procedure: COLONOSCOPY;  Surgeon: Daneil Dolin, MD;  Location: [facility];  Service: Endoscopy;  Laterality: N/A;  Allergic to Phenergan. Last procedure performed with Versed, Demerol, and Zofran. Give Zofran 4 mg IV on call  . Cardiac catheterization      2008  . Esophagogastroduodenoscopy Left  10/23/2012    Procedure: ESOPHAGOGASTRODUODENOSCOPY (EGD);  Surgeon: Daneil Dolin, MD;  Location: [facility];  Service: Endoscopy;  Laterality: Left;  . Total hip arthroplasty Left 01/2015    in [place], [place]  . Total hip arthroplasty     Family History  Problem Relation Age of Onset  . Colon cancer Neg Hx   . Liver cancer Neg Hx   . Breast cancer Neg Hx   . Inflammatory bowel disease Neg Hx   . Heart attack Mother    Social History  Substance Use Topics  . Smoking status: Never Smoker   . Smokeless tobacco: Never Used  . Alcohol Use: No   OB History    No data available     Review of Systems  All systems reviewed and negative, other than as noted in HPI.   Allergies  Compazine; Phenergan; Vistaril; Benadryl; Gabapentin; and Latex  Home Medications   Prior to Admission medications   Medication Sig Start Date End Date Taking? Authorizing Provider  ALPRAZolam Duanne Moron) 0.5 MG tablet Take 0.5 mg by mouth 2 (two) times daily.   Yes Historical Provider, MD  calcium gluconate 500 MG tablet Take 1 tablet by mouth daily.   Yes Historical Provider, MD  lisinopril (PRINIVIL,ZESTRIL) 20 MG tablet Take 20 mg by mouth daily.   Yes Historical Provider, MD  ondansetron (ZOFRAN ODT) 4 MG disintegrating tablet Take 1 tablet (4 mg total) by mouth every 6 (six) hours as needed for nausea or vomiting. 06/02/15  Yes Kathie Dike, MD  pantoprazole (PROTONIX) 40 MG tablet Take 1 tablet (40 mg total) by mouth 2 (two) times daily. 06/02/15  Yes Kathie Dike, MD  PARoxetine (PAXIL) 40 MG tablet Take 40 mg by mouth every morning.   Yes Historical Provider, MD  polyethylene glycol (MIRALAX / GLYCOLAX) packet Take 17 g by mouth 2 (two) times daily. 06/02/15  Yes Kathie Dike, MD  senna (SENOKOT) 8.6 MG TABS tablet Take 2 tablets (17.2 mg total) by mouth 2 (two) times daily. 06/02/15  Yes Kathie Dike, MD  zolpidem (AMBIEN) 5 MG tablet Take 5 mg by mouth at bedtime.    Yes Historical Provider, MD   aspirin EC 325 MG tablet Take 650 mg by mouth once as needed for mild pain or moderate pain.    Historical Provider, MD  HYDROcodone-acetaminophen (NORCO/VICODIN) 5-325 MG tablet Take 1-2 tablets by mouth every 6 (six) hours as needed for severe pain. 06/02/15   Kathie Dike, MD  metoCLOPramide (REGLAN) 10 MG tablet Take 1 tablet (10 mg total) by mouth 3 (three) times daily before meals. 06/02/15   Kathie Dike, MD  Vitamin D, Ergocalciferol, (DRISDOL) 50000 units CAPS capsule Take 50,000 Units by mouth 2 (two) times a week.    Historical Provider, MD   BP 133/93 mmHg  Pulse 74  Temp(Src) 98.4 F (36.9 C) (Oral)  Resp 20  Ht 5\' 3"  (1.6 m)  Wt 103 lb (46.72 kg)  BMI 18.25 kg/m2  SpO2 98% Physical Exam  Constitutional: She appears well-developed and well-nourished. No distress.  Sitting up in bed. Appears uncomfortable. Dry heaving.  HENT:  Head: Normocephalic and atraumatic.  Eyes: Conjunctivae are normal. Right eye exhibits no discharge. Left eye exhibits no discharge.  Neck: Neck supple.  Cardiovascular: Normal rate, regular rhythm and normal heart sounds.  Exam reveals no gallop and no friction rub.   No murmur heard. Pulmonary/Chest: Effort normal and breath sounds normal. No respiratory distress.  Abdominal: Soft. She exhibits no distension. There is tenderness.  Tenderness in the epigastrium without rebound or guarding. Does not seem distended.  Musculoskeletal: She exhibits no edema or tenderness.  Neurological: She is alert.  Skin: Skin is warm and dry.  Psychiatric: She has a normal mood and affect. Her behavior is normal. Thought content normal.  Nursing note and vitals reviewed.   ED Course  Procedures (including critical care time) Labs Review Labs Reviewed  URINALYSIS, ROUTINE W REFLEX MICROSCOPIC (NOT AT [facility]) - Abnormal; Notable for the following:    APPearance HAZY (*)    Specific Gravity, Urine <1.005 (*)    Hgb urine dipstick TRACE (*)    Leukocytes, UA  LARGE (*)    All other components within normal limits  URINE MICROSCOPIC-ADD ON - Abnormal; Notable for the following:    Squamous Epithelial / LPF 0-5 (*)    Bacteria, UA FEW (*)    All other components within normal limits  COMPREHENSIVE METABOLIC PANEL - Abnormal; Notable for the following:    ALT 13 (*)    Alkaline Phosphatase 155 (*)    All other components within normal limits  CBC WITH DIFFERENTIAL/PLATELET - Abnormal; Notable for the following:    MCH 25.4 (*)    RDW 16.5 (*)    Platelets 416 (*)    All other components within normal limits  URINE CULTURE  LIPASE, BLOOD    Imaging Review No results found. I have personally reviewed and evaluated these images and lab results as part of my medical decision-making.   EKG Interpretation None      MDM   Final diagnoses:  Epigastric pain  Nausea and vomiting, vomiting of unspecified type    60 year old female with abdominal pain.  Recent admission for similar symptoms. Workup today fairly unremarkable. UA is suggestive a urinary tract infection. Few bacteria with only 0-5 squamous cells, too numerous to count WBC, large leuks and trace blood. Abdominal pain is more upper abdomen/epigastric though. Doesn't likely explain all symptoms, but could be contributing. Urine culture. Rocephin.   Ulcer?  She reports similar type symptoms with PUD as well as with pancreatitis. Followed by Dr Oneida Alar, GI. Reports last endoscopy about three years ago. She is status post cholecystectomy. Lipase is normal. Had both RUQ [place] and CT A/P during last hospitalization. CT then significant for large stool burden and urinary retention. Consistent BM since being on bowel regimen. No specific urinary complaints. I doubt there is much utility in repeating imaging again today without significant change in symptoms.   She has been in the ED for an extended period of time now. Reports symptoms are not much better. Will discuss with medicine for possible  admission for further symptom management.   Virgel Manifold, MD 06/12/15 [phone]

## 2015-06-08 NOTE — ED Notes (Signed)
Pt comes in with upper abdominal pain accompanied by vomiting starting 2 days ago. Pt denies any diarrhea. Pt states she was in the hospital last week for the same type of symptoms.

## 2015-06-09 ENCOUNTER — Encounter (HOSPITAL_COMMUNITY): Payer: Self-pay | Admitting: Internal Medicine

## 2015-06-09 DIAGNOSIS — R1012 Left upper quadrant pain: Secondary | ICD-10-CM

## 2015-06-09 DIAGNOSIS — Z9884 Bariatric surgery status: Secondary | ICD-10-CM | POA: Diagnosis not present

## 2015-06-09 DIAGNOSIS — M0579 Rheumatoid arthritis with rheumatoid factor of multiple sites without organ or systems involvement: Secondary | ICD-10-CM

## 2015-06-09 DIAGNOSIS — N390 Urinary tract infection, site not specified: Secondary | ICD-10-CM | POA: Diagnosis present

## 2015-06-09 DIAGNOSIS — G43A0 Cyclical vomiting, not intractable: Secondary | ICD-10-CM

## 2015-06-09 DIAGNOSIS — Z8711 Personal history of peptic ulcer disease: Secondary | ICD-10-CM | POA: Diagnosis not present

## 2015-06-09 DIAGNOSIS — R1110 Vomiting, unspecified: Secondary | ICD-10-CM | POA: Insufficient documentation

## 2015-06-09 DIAGNOSIS — Z90710 Acquired absence of both cervix and uterus: Secondary | ICD-10-CM | POA: Diagnosis not present

## 2015-06-09 DIAGNOSIS — D649 Anemia, unspecified: Secondary | ICD-10-CM | POA: Diagnosis present

## 2015-06-09 DIAGNOSIS — F40240 Claustrophobia: Secondary | ICD-10-CM | POA: Diagnosis present

## 2015-06-09 DIAGNOSIS — Z8249 Family history of ischemic heart disease and other diseases of the circulatory system: Secondary | ICD-10-CM | POA: Diagnosis not present

## 2015-06-09 DIAGNOSIS — I10 Essential (primary) hypertension: Secondary | ICD-10-CM

## 2015-06-09 DIAGNOSIS — Z87442 Personal history of urinary calculi: Secondary | ICD-10-CM | POA: Diagnosis not present

## 2015-06-09 DIAGNOSIS — Z96642 Presence of left artificial hip joint: Secondary | ICD-10-CM | POA: Diagnosis present

## 2015-06-09 DIAGNOSIS — M069 Rheumatoid arthritis, unspecified: Secondary | ICD-10-CM | POA: Diagnosis present

## 2015-06-09 DIAGNOSIS — Z79899 Other long term (current) drug therapy: Secondary | ICD-10-CM | POA: Diagnosis not present

## 2015-06-09 DIAGNOSIS — R1013 Epigastric pain: Secondary | ICD-10-CM | POA: Diagnosis not present

## 2015-06-09 DIAGNOSIS — R112 Nausea with vomiting, unspecified: Secondary | ICD-10-CM | POA: Diagnosis present

## 2015-06-09 DIAGNOSIS — Z903 Acquired absence of stomach [part of]: Secondary | ICD-10-CM | POA: Diagnosis not present

## 2015-06-09 DIAGNOSIS — Z9049 Acquired absence of other specified parts of digestive tract: Secondary | ICD-10-CM | POA: Diagnosis not present

## 2015-06-09 DIAGNOSIS — K760 Fatty (change of) liver, not elsewhere classified: Secondary | ICD-10-CM | POA: Diagnosis present

## 2015-06-09 DIAGNOSIS — K838 Other specified diseases of biliary tract: Secondary | ICD-10-CM | POA: Diagnosis present

## 2015-06-09 DIAGNOSIS — B952 Enterococcus as the cause of diseases classified elsewhere: Secondary | ICD-10-CM | POA: Diagnosis present

## 2015-06-09 DIAGNOSIS — R1011 Right upper quadrant pain: Secondary | ICD-10-CM | POA: Diagnosis present

## 2015-06-09 HISTORY — DX: Essential (primary) hypertension: I10

## 2015-06-09 LAB — BASIC METABOLIC PANEL
Anion gap: 9 (ref 5–15)
BUN: 11 mg/dL (ref 6–20)
CALCIUM: 8.5 mg/dL — AB (ref 8.9–10.3)
CHLORIDE: 108 mmol/L (ref 101–111)
CO2: 25 mmol/L (ref 22–32)
CREATININE: 0.48 mg/dL (ref 0.44–1.00)
GFR calc Af Amer: >60 mL/min (ref >60)
GFR calc non Af Amer: >60 mL/min (ref >60)
GLUCOSE: 84 mg/dL (ref 65–99)
Potassium: 3.2 mmol/L — ABNORMAL LOW (ref 3.5–5.1)
Sodium: 142 mmol/L (ref 135–145)

## 2015-06-09 LAB — CBC
HCT: 35.2 % — ABNORMAL LOW (ref 36.0–46.0)
Hemoglobin: 10.9 g/dL — ABNORMAL LOW (ref 12.0–15.0)
MCH: 25.3 pg — AB (ref 26.0–34.0)
MCHC: 31 g/dL (ref 30.0–36.0)
MCV: 81.9 fL (ref 78.0–100.0)
PLATELETS: 346 10*3/uL (ref 150–400)
RBC: 4.3 MIL/uL (ref 3.87–5.11)
RDW: 16.2 % — ABNORMAL HIGH (ref 11.5–15.5)
WBC: 5.9 10*3/uL (ref 4.0–10.5)

## 2015-06-09 LAB — GAMMA GT: GGT: 38 U/L (ref 7–50)

## 2015-06-09 MED ORDER — ALPRAZOLAM 0.5 MG PO TABS
0.5000 mg | ORAL_TABLET | Freq: Two times a day (BID) | ORAL | Status: DC
  Administered 2015-06-09 – 2015-06-17 (×16): 0.5 mg via ORAL
  Filled 2015-06-09 (×16): qty 1

## 2015-06-09 MED ORDER — SODIUM CHLORIDE 0.9 % IV SOLN: INTRAVENOUS | Status: DC

## 2015-06-09 MED ORDER — ONDANSETRON HCL 4 MG PO TABS
4.0000 mg | ORAL_TABLET | Freq: Four times a day (QID) | ORAL | Status: DC | PRN
  Administered 2015-06-09 – 2015-06-17 (×2): 4 mg via ORAL
  Filled 2015-06-09 (×2): qty 1

## 2015-06-09 MED ORDER — SODIUM CHLORIDE 0.9 % IV SOLN
INTRAVENOUS | Status: DC
  Administered 2015-06-09: 03:00:00 via INTRAVENOUS

## 2015-06-09 MED ORDER — PANTOPRAZOLE SODIUM 40 MG IV SOLR
40.0000 mg | Freq: Two times a day (BID) | INTRAVENOUS | Status: DC
  Administered 2015-06-09 – 2015-06-17 (×17): 40 mg via INTRAVENOUS
  Filled 2015-06-09 (×17): qty 40

## 2015-06-09 MED ORDER — PAROXETINE HCL 20 MG PO TABS
40.0000 mg | ORAL_TABLET | Freq: Every morning | ORAL | Status: DC
Start: 2015-06-09 — End: 2015-06-17
  Administered 2015-06-09 – 2015-06-17 (×8): 40 mg via ORAL
  Filled 2015-06-09 (×9): qty 2

## 2015-06-09 MED ORDER — ACETAMINOPHEN 325 MG PO TABS: 650.0000 mg | ORAL_TABLET | Freq: Four times a day (QID) | ORAL | Status: DC | PRN

## 2015-06-09 MED ORDER — HYDROMORPHONE HCL 1 MG/ML IJ SOLN
0.5000 mg | INTRAMUSCULAR | Status: DC | PRN
  Administered 2015-06-09 – 2015-06-17 (×65): 1 mg via INTRAVENOUS
  Filled 2015-06-09 (×65): qty 1

## 2015-06-09 MED ORDER — ACETAMINOPHEN 650 MG RE SUPP: 650.0000 mg | Freq: Four times a day (QID) | RECTAL | Status: DC | PRN

## 2015-06-09 MED ORDER — OXYCODONE HCL 5 MG PO TABS
5.0000 mg | ORAL_TABLET | ORAL | Status: DC | PRN
  Administered 2015-06-09 – 2015-06-16 (×6): 5 mg via ORAL
  Filled 2015-06-09 (×9): qty 1

## 2015-06-09 MED ORDER — POTASSIUM CHLORIDE IN NACL 20-0.9 MEQ/L-% IV SOLN
INTRAVENOUS | Status: DC
  Administered 2015-06-09: 05:00:00 via INTRAVENOUS
  Administered 2015-06-09: 75 mL via INTRAVENOUS
  Administered 2015-06-10 – 2015-06-13 (×5): via INTRAVENOUS
  Administered 2015-06-13: 1 mL via INTRAVENOUS
  Administered 2015-06-14 – 2015-06-17 (×6): via INTRAVENOUS

## 2015-06-09 MED ORDER — ONDANSETRON HCL 4 MG/2ML IJ SOLN
4.0000 mg | Freq: Four times a day (QID) | INTRAMUSCULAR | Status: DC | PRN
  Administered 2015-06-12 – 2015-06-16 (×4): 4 mg via INTRAVENOUS
  Filled 2015-06-09 (×2): qty 2

## 2015-06-09 MED ORDER — ONDANSETRON HCL 4 MG/2ML IJ SOLN
4.0000 mg | Freq: Three times a day (TID) | INTRAMUSCULAR | Status: DC
  Administered 2015-06-09 – 2015-06-17 (×31): 4 mg via INTRAVENOUS
  Filled 2015-06-09 (×32): qty 2

## 2015-06-09 MED ORDER — ENOXAPARIN SODIUM 40 MG/0.4ML ~~LOC~~ SOLN
40.0000 mg | SUBCUTANEOUS | Status: DC
  Administered 2015-06-09 – 2015-06-17 (×9): 40 mg via SUBCUTANEOUS
  Filled 2015-06-09 (×9): qty 0.4

## 2015-06-09 MED ORDER — ONDANSETRON HCL 4 MG/2ML IJ SOLN: 4.0000 mg | Freq: Three times a day (TID) | INTRAMUSCULAR | Status: AC | PRN

## 2015-06-09 MED ORDER — CEFTRIAXONE SODIUM 1 G IJ SOLR
1.0000 g | INTRAMUSCULAR | Status: DC
  Administered 2015-06-09 – 2015-06-10 (×2): 1 g via INTRAVENOUS
  Filled 2015-06-09 (×4): qty 10

## 2015-06-09 MED ORDER — ZOLPIDEM TARTRATE 5 MG PO TABS
5.0000 mg | ORAL_TABLET | Freq: Every day | ORAL | Status: DC
  Administered 2015-06-09 – 2015-06-16 (×8): 5 mg via ORAL
  Filled 2015-06-09 (×8): qty 1

## 2015-06-09 MED ORDER — HYDROMORPHONE HCL 1 MG/ML IJ SOLN
0.5000 mg | INTRAMUSCULAR | Status: DC | PRN
  Administered 2015-06-09 (×2): 1 mg via INTRAVENOUS
  Filled 2015-06-09 (×2): qty 1

## 2015-06-09 NOTE — Progress Notes (Signed)
[facility] PROGRESS NOTE  Jordan Haney [id number] DOB: 06/10/1955 DOA: 06/08/2015 PCP: [name]  Assessment/Plan: 60 y/o female with PMH of HTN, RA on Methotrexate, Chronic pain, h/o  Partial Gastrectomy, PUD who was recently admitted for abdominal pains (3/7-3/13) presented with non resolving abdominal pains, associated with nausea, vomiting, and UTI  1. Nausea/vomiting, abdominal pains. R/o PUD. Pt has h/o PUDs. Recent abd CT: no acute findings. Exam mild epigastric tender. No s/s of acute bleeding  -cont PPI, IVF, pain control, consult GI eval for EGD  2. UTI. Patient is afebrile, cont IV atx. F/u cultures  3. HTN. Stable. Resume lisinopril when able to take PO. Prn hydralazine   Code Status: full Family Communication:  D/w patient (indicate person spoken with, relationship, and if by phone, the number) Disposition Plan: home 2-3 days    Consultants:  GI  Procedures:  None   Antibiotics:  Ceftriaxone 3/20<<< (indicate start date, and stop date if known)  HPI/Subjective: Alert,. No distress   Objective: Filed Vitals:   06/09/15 0215 06/09/15 0649  BP:  151/87  Pulse: 60 62  Temp:  97.8 F (36.6 C)  Resp:  18   No intake or output data in the 24 hours ending 06/09/15 0851 Filed Weights   06/08/15 1510  Weight: 46.72 kg (103 lb)    Exam:   General:  No distress   Cardiovascular: s1,s2 rrr  Respiratory: CTA BL   Abdomen: soft, mild tender epigastric, no rebound   Musculoskeletal: no leg edema    Data Reviewed: Basic Metabolic Panel:  Recent Labs Lab 06/08/15 1830 06/09/15 0508  NA 139 142  K 3.5 3.2*  CL 104 108  CO2 27 25  GLUCOSE 90 84  BUN 12 11  CREATININE 0.57 0.48  CALCIUM 9.1 8.5*   Liver Function Tests:  Recent Labs Lab 06/08/15 1830  AST 24  ALT 13*  ALKPHOS 155*  BILITOT 0.5  PROT 6.7  ALBUMIN 3.7    Recent Labs Lab 06/08/15 1830  LIPASE 33   No results for input(s): AMMONIA in the  last 168 hours. CBC:  Recent Labs Lab 06/08/15 1830 06/09/15 0508  WBC 5.9 5.9  NEUTROABS 3.5  --   HGB 12.3 10.9*  HCT 38.8 35.2*  MCV 80.2 81.9  PLT 416* 346   Cardiac Enzymes: No results for input(s): CKTOTAL, CKMB, CKMBINDEX, TROPONINI in the last 168 hours. BNP (last 3 results) No results for input(s): BNP in the last 8760 hours.  ProBNP (last 3 results) No results for input(s): PROBNP in the last 8760 hours.  CBG: No results for input(s): GLUCAP in the last 168 hours.  No results found for this or any previous visit (from the past 240 hour(s)).   Studies: No results found.  Scheduled Meds: . ALPRAZolam  0.5 mg Oral BID  . cefTRIAXone (ROCEPHIN)  IV  1 g Intravenous Q24H  . enoxaparin (LOVENOX) injection  40 mg Subcutaneous Q24H  . pantoprazole (PROTONIX) IV  40 mg Intravenous Q12H  . PARoxetine  40 mg Oral q morning - 10a  . zolpidem  5 mg Oral QHS   Continuous Infusions: . sodium chloride 100 mL/hr at 06/09/15 0316  . sodium chloride      Principal Problem:   Nausea and vomiting Active Problems:   Rheumatoid arthritis (HCC)   UTI (lower urinary tract infection)   Epigastric abdominal pain   Essential hypertension    Time spent: >35 minutes     Amritpal Shropshire  N  [facility] Pager [phone]. If 7PM-7AM, please contact night-coverage at [email or web address], password [facility] 06/09/2015, 8:51 AM  LOS: 0 days

## 2015-06-09 NOTE — Care Management Note (Signed)
Case Management Note  Patient Details  Name: Jordan Haney MRN: [id number] Date of Birth: [birth year]  Subjective/Objective:                  Pt admitted for n/v/abd pain. Pt is from home, lives with boyfriend and is ind with ADL's. Pt has PCP, transportation and no difficulty obtaining medications. Pt plans to return home with self care at DC. Pt states she had a f/u appointment with her PCP today from her previous hospitalization.   Action/Plan: No CM needs anticipated.   Expected Discharge Date:     06/14/2015             Expected Discharge Plan:  Home/Self Care  In-House Referral:  NA  Discharge planning Services  CM Consult  Post Acute Care Choice:  NA Choice offered to:  NA  DME Arranged:    DME Agency:     HH Arranged:    HH Agency:     Status of Service:  Completed, signed off  Medicare Important Message Given:    Date Medicare IM Given:    Medicare IM give by:    Date Additional Medicare IM Given:    Additional Medicare Important Message give by:     If discussed at [place] of Stay Meetings, dates discussed:    Additional Comments:  Sherald Barge, RN 06/09/2015, 4:21 PM

## 2015-06-09 NOTE — H&P (Signed)
[facility] Admission History and Physical       Jordan Haney [id number] DOB: 12/29/1955 DOA: 06/08/2015  Referring physician: EDP PCP: [name]  Specialists:   Chief Complaint: ABD Pain, Nausea and Vomiting  HPI: Jordan Haney is a 60 y.o. female with a history of Rheumatoid Arthritis, Chronic ABD Pain,PUD, and HTN who presented to the ED with complaints of severe 8-9/10 sharp Epigastric ABD pain with Nausea and Vomiting x 2 days.   She denies any fevers or chills or diarrhea and also denies having any hematemesis, hematochezia or melena.    She reports that she has not been able to hold down foods or liquids during this time.   She was hospitalized from 03/7- 03/13 for similar symptoms and had an  US of the ABD, and A CTscan of the ABD without acute findings.            Review of Systems:  Constitutional: No Weight Loss, No Weight Gain, Night Sweats, Fevers, Chills, Dizziness, Light Headedness, Fatigue, or Generalized Weakness HEENT: No Headaches, Difficulty Swallowing,Tooth/Dental Problems,Sore Throat,  No Sneezing, Rhinitis, Ear Ache, Nasal Congestion, or Post Nasal Drip,  Cardio-vascular:  No Chest pain, Orthopnea, PND, Edema in Lower Extremities, Anasarca, Dizziness, Palpitations  Resp: No Dyspnea, No DOE, No Productive Cough, No Non-Productive Cough, No Hemoptysis, No Wheezing.    GI: No Heartburn, Indigestion, +Abdominal Pain, +Nausea, +Vomiting, Diarrhea, Constipation, Hematemesis, Hematochezia, Melena, Change in Bowel Habits,  +Loss of Appetite  GU: No Dysuria, No Change in Color of Urine, No Urgency or Urinary Frequency, No Flank pain.  Musculoskeletal: No Joint Pain or Swelling, No Decreased Range of Motion, No Back Pain.  Neurologic: No Syncope, No Seizures, Muscle Weakness, Paresthesia, Vision Disturbance or Loss, No Diplopia, No Vertigo, No Difficulty Walking,  Skin: No Rash or Lesions. Psych: No Change in Mood or Affect, No Depression or  Anxiety, No Memory loss, No Confusion, or Hallucinations   Past Medical History  Diagnosis Date  . RA (rheumatoid arthritis) ([place])  2008  . Constipation 03/21/2011  . Hypokalemia 03/21/2011  . Fatty liver 03/21/2011  . Pancreatitis     states 3 years ago, very severe, ?biliary pancreatitis   . T12 compression fracture ([place]) 2011  . Duodenal ulcer     remote per patient. +BC powders, patient report negative EGD six months ago  . Dilated pancreatic duct ([place])     ?chronic pancreatitis, EUS pending (06/02/11)  . Kidney stones   . Chronic abdominal pain   . Pneumonia     as child  . Anemia   . Lumbar stress fracture   . Anxiety      Past Surgical History  Procedure Laterality Date  . Cholecystectomy    . Knee surgery    . Appendectomy    . Complete hysterectomy    . Ventral wall hernia    . Esophagogastroduodenoscopy  08/2010    Dr. Earley Brooke, Versed. small hh, mild prepylori gastritis. path unavailable  . Esophagogastroduodenoscopy  04/2010    Dr. Earley Brooke, Versed. small hh, mild distal esophagitis, antral gastritis and duodenitis. Bx mild chronic gastritis and no H.pylori  . Esophagogastroduodenoscopy  08/2009    Dr. Posey Pronto, Versed. Moderate gastritis, moderate duodenitis with nodularity in proximal duodenal bulb, bx chronic gastritis, no helicobacter, mild chronic duodenitis  . Esophagogastroduodenoscopy  02/2007    Dr. Earley Brooke, Versed. antral gastritis and duodenal ulcers. Bx mild chronic gastritis. No bx from duodenal ulcer available.  [name] dilation  04/06/2011  Procedure: SAVORY DILATION;  Surgeon: Dorothyann Peng, MD;  Location: [facility];  Service: Endoscopy;  Laterality: N/A;  16 mm  . Esophagogastroduodenoscopy  04/06/11    Dr. Oneida Alar: 1-2 cm hiatal hernia, mod gastritis, 59mmX3mm linear ulcer in duodenal bulb, stricture 1st part of duodenum, dilated to 12 mm  . Esophagogastroduodenoscopy  05/12/11    partial gastric oulet obstruction secondary to stricture between bulb/2nd  portion of duodenum with marked friability and inflammation but no discrete ulcer, dilated stomach. s/p dilation but high risk for restenosis  . Esophagogastroduodenoscopy  06/22/2011    [id number] ring/Moderate gastritis/PERSISTENT Ulcer in the bulb/descending duodenum Stricture in the bulb/descending duodenum  . Gastrojejunostomy  06/28/2011    Procedure: GASTROJEJUNOSTOMY;  Surgeon: Donato Heinz, MD;  Location: [facility];  Service: General;  Laterality: N/A;  Roux-en-y, Choledocalduodenostomy  . Abdominal hysterectomy    . Kidney stone surgery    . Esophagogastroduodenoscopy Left 06/25/2012    RMR: Normal esophagus. Surgically altered anastomosis to  small intestine noted. Couple of  at the anastomosis.  Patent efferent limb Patient had an oozing 1 cm anastomotic ulcer  . Back surgery  2013  . Colonoscopy N/A 08/16/2012    Procedure: COLONOSCOPY;  Surgeon: Daneil Dolin, MD;  Location: [facility];  Service: Endoscopy;  Laterality: N/A;  Allergic to Phenergan. Last procedure performed with Versed, Demerol, and Zofran. Give Zofran 4 mg IV on call  . Cardiac catheterization      2008  . Esophagogastroduodenoscopy Left 10/23/2012    Procedure: ESOPHAGOGASTRODUODENOSCOPY (EGD);  Surgeon: Daneil Dolin, MD;  Location: [facility];  Service: Endoscopy;  Laterality: Left;  . Total hip arthroplasty Left 01/2015    in [place], [place]  . Total hip arthroplasty        Prior to Admission medications   Medication Sig Start Date End Date Taking? Authorizing Provider  ALPRAZolam Duanne Moron) 0.5 MG tablet Take 0.5 mg by mouth 2 (two) times daily.   Yes Historical Provider, MD  calcium gluconate 500 MG tablet Take 1 tablet by mouth daily.   Yes Historical Provider, MD  lisinopril (PRINIVIL,ZESTRIL) 20 MG tablet Take 20 mg by mouth daily.   Yes Historical Provider, MD  ondansetron (ZOFRAN ODT) 4 MG disintegrating tablet Take 1 tablet (4 mg total) by mouth every 6 (six) hours as needed for nausea or vomiting.  06/02/15  Yes Kathie Dike, MD  pantoprazole (PROTONIX) 40 MG tablet Take 1 tablet (40 mg total) by mouth 2 (two) times daily. 06/02/15  Yes Kathie Dike, MD  PARoxetine (PAXIL) 40 MG tablet Take 40 mg by mouth every morning.   Yes Historical Provider, MD  polyethylene glycol (MIRALAX / GLYCOLAX) packet Take 17 g by mouth 2 (two) times daily. 06/02/15  Yes Kathie Dike, MD  senna (SENOKOT) 8.6 MG TABS tablet Take 2 tablets (17.2 mg total) by mouth 2 (two) times daily. 06/02/15  Yes Kathie Dike, MD  zolpidem (AMBIEN) 5 MG tablet Take 5 mg by mouth at bedtime.    Yes Historical Provider, MD  aspirin EC 325 MG tablet Take 650 mg by mouth once as needed for mild pain or moderate pain.    Historical Provider, MD  HYDROcodone-acetaminophen (NORCO/VICODIN) 5-325 MG tablet Take 1-2 tablets by mouth every 6 (six) hours as needed for severe pain. 06/02/15   Kathie Dike, MD  metoCLOPramide (REGLAN) 10 MG tablet Take 1 tablet (10 mg total) by mouth 3 (three) times daily before meals. 06/02/15   Kathie Dike, MD  Vitamin D, Ergocalciferol, (DRISDOL) 50000 units CAPS capsule Take 50,000 Units by mouth 2 (two) times a week.    Historical Provider, MD     Allergies  Allergen Reactions  . Compazine Anaphylaxis  . Phenergan [Promethazine] Anaphylaxis  . Vistaril [Hydroxyzine Hcl] Other (See Comments)    Reaction: legs shake  . Benadryl [Diphenhydramine Hcl] Other (See Comments)    Pt states, "it makes my legs shake really bad"   . Gabapentin Other (See Comments)    Reactions: legs shake  . Latex Swelling    Social History:  reports that she has never smoked. She has never used smokeless tobacco. She reports that she does not drink alcohol or use illicit drugs.    Family History  Problem Relation Age of Onset  . Colon cancer Neg Hx   . Liver cancer Neg Hx   . Breast cancer Neg Hx   . Inflammatory bowel disease Neg Hx   . Heart attack Mother        Physical Exam:  GEN:  Pleasant Well  Nourished and Well Developed  60 y.o. Caucasian female examined and in no acute distress; cooperative with exam Filed Vitals:   06/08/15 2315 06/08/15 2330 06/08/15 2345 06/09/15 0000  BP: 145/94 174/106 153/90 141/95  Pulse: 72 68 62 66  Temp:      TempSrc:      Resp:      Height:      Weight:      SpO2: 95% 99% 95% 97%   Blood pressure 141/95, pulse 66, temperature 98.4 F (36.9 C), temperature source Oral, resp. rate 20, height 5\' 3"  (1.6 m), weight 46.72 kg (103 lb), SpO2 97 %. PSYCH: She is alert and oriented x4; does not appear anxious does not appear depressed; affect is normal HEENT: Normocephalic and Atraumatic, Mucous membranes pink; PERRLA; EOM intact; Fundi:  Benign;  No scleral icterus, Nares: Patent, Oropharynx: Clear, Fair Dentition,    Neck:  FROM, No Cervical Lymphadenopathy nor Thyromegaly or Carotid Bruit; No JVD; Breasts:: Not examined CHEST WALL: No tenderness CHEST: Normal respiration, clear to auscultation bilaterally HEART: Regular rate and rhythm; no murmurs rubs or gallops BACK: No kyphosis or scoliosis; No CVA tenderness ABDOMEN: Positive Bowel Sounds, Soft Non-Tender, No Rebound or Guarding; No Masses, No Organomegaly Rectal Exam: Not done EXTREMITIES: No Cyanosis, Clubbing, or Edema; No Ulcerations. Genitalia: not examined PULSES: 2+ and symmetric SKIN: Normal hydration no rash or ulceration CNS:  Alert and Oriented x 4, No Focal Deficits Vascular: pulses palpable throughout    Labs on Admission:  Basic Metabolic Panel:  Recent Labs Lab 06/08/15 1830  NA 139  K 3.5  CL 104  CO2 27  GLUCOSE 90  BUN 12  CREATININE 0.57  CALCIUM 9.1   Liver Function Tests:  Recent Labs Lab 06/08/15 1830  AST 24  ALT 13*  ALKPHOS 155*  BILITOT 0.5  PROT 6.7  ALBUMIN 3.7    Recent Labs Lab 06/08/15 1830  LIPASE 33   No results for input(s): AMMONIA in the last 168 hours. CBC:  Recent Labs Lab 06/08/15 1830  WBC 5.9  NEUTROABS 3.5  HGB  12.3  HCT 38.8  MCV 80.2  PLT 416*   Cardiac Enzymes: No results for input(s): CKTOTAL, CKMB, CKMBINDEX, TROPONINI in the last 168 hours.  BNP (last 3 results) No results for input(s): BNP in the last 8760 hours.  ProBNP (last 3 results) No results for input(s): PROBNP in the last 8760 hours.  CBG: No results for input(s): GLUCAP in the last 168 hours.  Radiological Exams on Admission: No results found.    Assessment/Plan:     61 y.o. female with  Active Problems:    Nausea and vomiting    PRN IV Zofran    PRN IV Reglan    IVFs    Clear Liquid Diet    IV Protonix          Epigastric abdominal pain    IV Protonix    Pain Control wit PRN IV Dilaudid      UTI (lower urinary tract infection)    Urine C+S Sent    IV Rocephin    Essential HTN    On Lisinopril RX    Monitor BPs      Rheumatoid arthritis (HCC)    Reports being on Methotrexate Rx      DVT Prophylaxis       Lovenox     Code Status:     FULL CODE      Family Communication:  No Family Present    Disposition Plan:    Inpatient  Status with Expected LOS 2-3 days       Time spent:70 Minutes      Theressa Millard [facility] Pager [phone]   If [place] Please Contact the Day Rounding Team MD for [facility]  If 7PM-7AM, Please Contact Night-Floor Coverage  [email or web address] Password [email or web address] 06/09/2015, 1:02 AM     ADDENDUM:   Patient was seen and examined on 06/09/2015

## 2015-06-09 NOTE — Consult Note (Signed)
Referring Provider: Dr. Daleen Bo  Primary Care Physician:  [name] Primary Gastroenterologist:  Dr. Oneida Alar   Date of Admission: 06/08/15 Date of Consultation: 06/09/15  Reason for Consultation:  Abdominal pain, nausea, vomiting.   HPI:  Jordan Haney is a 60 y.o. year old female with complicated past medical history to include PUD with multiple EGDs historically, presenting with UGI bleed in Feb 2013 and found to have large duodenal ulcer with stricture. It was felt she had NSAID-induced PUD. Follow-up EGD in April 2013 with non-healing ulcer; therefore, she underwent Roux-en-Y gastrojejunostomy by Dr. Geroge Baseman and had to have a choledochoduodnestomy as well due to bile duct injury. She had surveillance EGD in Aug 2014, which showed mild erosive reflux esophagitis and healed area of prior gastric ulcer. She has a history of pancreatitis in remote past, felt to be biliary in origin at that time; gallbladder absent now. It was felt she has chronic pancreatitis and EUS had been mentioned but I do not believe this was done; patient denies any prior EUS. She also suffered a bowel obstruction sometime in the summer of 2014 and underwent exploratory laparotomy and tells me she had to have a small bowel resection.   She presented yesterday with severe abdominal pain, N/V. She was hospitalized previously from 3/7-3/13 with similar symptoms. CT 3/8 with distended urinary bladder, stable severe fatty liver, large stool burden noted. US abdomen with mild CBD dilatation at 13 mm s/p cholecystectomy. Hgb fluctuating between 10-12 range. Lipase normal. Her weight is stable from 3 years ago, in the low 100 range. 2 days ago acute onset of epigastric pain, N/V. Has been taking pancreatic enzyme therapy at home per her report, but this is not listed in [facility]. No fever or chills. On methotrexate, started about 3 months ago. No other NSAIDs, aspirin powders. [place] and broke her hip 3 months ago. 4 weeks ago  broke 2 vertebrae in her back. Has had a "time with my bones". States she has a BM every day, feels like it is productive. On abx for UTI now. Still with abdominal pain. Feels like food gets "stuck" in upper abdomen. Only able to eat small amounts, early satiety since surgery. When she does eat, feels like it takes forever to empty. Symptoms worsened recently. No melena or hematochezia.   Past Medical History  Diagnosis Date  . RA (rheumatoid arthritis) ([place])  2008  . Constipation 03/21/2011  . Hypokalemia 03/21/2011  . Fatty liver 03/21/2011  . Pancreatitis     states in remote past, very severe, ?biliary pancreatitis   . T12 compression fracture ([place]) 2011  . Duodenal ulcer     remote per patient. +BC powders, patient report negative EGD six months ago  . Dilated pancreatic duct ([place])     ?chronic pancreatitis   . Kidney stones   . Chronic abdominal pain   . Pneumonia     as child  . Anemia   . Lumbar stress fracture   . Anxiety   . Essential hypertension 06/09/2015    Past Surgical History  Procedure Laterality Date  . Cholecystectomy    . Knee surgery    . Appendectomy    . Complete hysterectomy    . Ventral wall hernia    . Esophagogastroduodenoscopy  08/2010    Dr. Earley Brooke, [place]. small hh, mild prepylori gastritis. path unavailable  . Esophagogastroduodenoscopy  04/2010    Dr. Earley Brooke, Vers[place]. small hh, mild distal esophagitis, antral gastritis and duodenitis. Bx mild  chronic gastritis and no H.pylori  . Esophagogastroduodenoscopy  08/2009    Dr. Posey Pronto, Versed. Moderate gastritis, moderate duodenitis with nodularity in proximal duodenal bulb, bx chronic gastritis, no helicobacter, mild chronic duodenitis  . Esophagogastroduodenoscopy  02/2007    Dr. Earley Brooke, Versed. antral gastritis and duodenal ulcers. Bx mild chronic gastritis. No bx from duodenal ulcer available.  [name] dilation  04/06/2011    Procedure: SAVORY DILATION;  Surgeon: Dorothyann Peng, MD;  Location: [facility];   Service: Endoscopy;  Laterality: N/A;  16 mm  . Esophagogastroduodenoscopy  04/06/11    Dr. Oneida Alar: 1-2 cm hiatal hernia, mod gastritis, 66mX3mm linear ulcer in duodenal bulb, stricture 1st part of duodenum, dilated to 12 mm  . Esophagogastroduodenoscopy  05/12/11    partial gastric oulet obstruction secondary to stricture between bulb/2nd portion of duodenum with marked friability and inflammation but no discrete ulcer, dilated stomach. s/p dilation but high risk for restenosis  . Esophagogastroduodenoscopy  06/22/2011    SIOX:BDZHGDJM'Ering/Moderate gastritis/PERSISTENT Ulcer in the bulb/descending duodenum Stricture in the bulb/descending duodenum  . Gastrojejunostomy  06/28/2011    Roux-en-Y with choledochoduodenostomy   . Abdominal hysterectomy    . Kidney stone surgery    . Esophagogastroduodenoscopy Left 06/25/2012    RMR: Normal esophagus. Surgically altered anastomosis to  small intestine noted. Couple of  at the anastomosis.  Patent efferent limb Patient had an oozing 1 cm anastomotic ulcer  . Back surgery  2013  . Colonoscopy N/A 08/16/2012    Dr. RGala Romney normal but inadequate prep. Consider early interval colonoscopy.   . Cardiac catheterization      2008  . Esophagogastroduodenoscopy Left 10/23/2012    Dr. RGala Romney mild erosive reflux esophagitis, s/p hemigastrectomy, prior site of gastric ulcer healed   . [place]    . [place]  . Exploratory laparotomy w/ bowel resection      2014, sometime in the summer     Prior to Admission medications   Medication Sig Start Date End Date Taking? Authorizing Provider  ALPRAZolam (Duanne Moron 0.5 MG tablet Take 0.5 mg by mouth 2 (two) times daily.   Yes Historical Provider, MD  calcium gluconate 500 MG tablet Take 1 tablet by mouth daily.   Yes Historical Provider, MD  lisinopril (PRINIVIL,ZESTRIL) 20 MG tablet Take 20 mg by mouth daily.   Yes  Historical Provider, MD  ondansetron (ZOFRAN ODT) 4 MG disintegrating tablet Take 1 tablet (4 mg total) by mouth every 6 (six) hours as needed for nausea or vomiting. 06/02/15  Yes JKathie Dike MD  pantoprazole (PROTONIX) 40 MG tablet Take 1 tablet (40 mg total) by mouth 2 (two) times daily. 06/02/15  Yes JKathie Dike MD  PARoxetine (PAXIL) 40 MG tablet Take 40 mg by mouth every morning.   Yes Historical Provider, MD  polyethylene glycol (MIRALAX / GLYCOLAX) packet Take 17 g by mouth 2 (two) times daily. 06/02/15  Yes JKathie Dike MD  senna (SENOKOT) 8.6 MG TABS tablet Take 2 tablets (17.2 mg total) by mouth 2 (two) times daily. 06/02/15  Yes JKathie Dike MD  zolpidem (AMBIEN) 5 MG tablet Take 5 mg by mouth at bedtime.    Yes Historical Provider, MD  aspirin EC 325 MG tablet Take 650 mg by mouth once as needed for mild pain or moderate pain.    Historical Provider, MD  HYDROcodone-acetaminophen (NORCO/VICODIN) 5-325  MG tablet Take 1-2 tablets by mouth every 6 (six) hours as needed for severe pain. 06/02/15   Kathie Dike, MD  metoCLOPramide (REGLAN) 10 MG tablet Take 1 tablet (10 mg total) by mouth 3 (three) times daily before meals. 06/02/15   Kathie Dike, MD  Vitamin D, Ergocalciferol, (DRISDOL) 50000 units CAPS capsule Take 50,000 Units by mouth 2 (two) times a week.    Historical Provider, MD    Current Facility-Administered Medications  Medication Dose Route Frequency Provider Last Rate Last Dose  . 0.9 % NaCl with KCl 20 mEq/ L  infusion   Intravenous Continuous Kinnie Feil, MD      . acetaminophen (TYLENOL) tablet 650 mg  650 mg Oral Q6H PRN Theressa Millard, MD       Or  . acetaminophen (TYLENOL) suppository 650 mg  650 mg Rectal Q6H PRN Theressa Millard, MD      . ALPRAZolam Duanne Moron) tablet 0.5 mg  0.5 mg Oral BID Theressa Millard, MD   0.5 mg at 06/09/15 6226  . cefTRIAXone (ROCEPHIN) 1 g in dextrose 5 % 50 mL IVPB  1 g Intravenous Q24H Theressa Millard, MD   1 g  at 06/09/15 0144  . enoxaparin (LOVENOX) injection 40 mg  40 mg Subcutaneous Q24H Theressa Millard, MD   40 mg at 06/09/15 0316  . HYDROmorphone (DILAUDID) injection 0.5-1 mg  0.5-1 mg Intravenous Q3H PRN Theressa Millard, MD   1 mg at 06/09/15 3335  . ondansetron (ZOFRAN) injection 4 mg  4 mg Intravenous Q8H PRN Virgel Manifold, MD      . ondansetron [facility]) tablet 4 mg  4 mg Oral Q6H PRN Theressa Millard, MD   4 mg at 06/09/15 4562   Or  . ondansetron (ZOFRAN) injection 4 mg  4 mg Intravenous Q6H PRN Theressa Millard, MD      . oxyCODONE (Oxy IR/ROXICODONE) immediate release tablet 5 mg  5 mg Oral Q4H PRN Theressa Millard, MD   5 mg at 06/09/15 0527  . pantoprazole (PROTONIX) injection 40 mg  40 mg Intravenous Q12H Theressa Millard, MD   40 mg at 06/09/15 5638  . PARoxetine (PAXIL) tablet 40 mg  40 mg Oral q morning - 10a Theressa Millard, MD   40 mg at 06/09/15 9373  . zolpidem (AMBIEN) tablet 5 mg  5 mg Oral QHS Theressa Millard, MD        Allergies as of 06/08/2015 - Review Complete 06/08/2015  Allergen Reaction Noted  . Compazine Anaphylaxis 03/20/2011  . Phenergan [promethazine] Anaphylaxis 10/21/2012  . Vistaril [hydroxyzine hcl] Other (See Comments) 03/20/2011  . Benadryl [diphenhydramine hcl] Other (See Comments) 06/25/2012  . Gabapentin Other (See Comments) 07/21/2011  . Latex Swelling 06/28/2011    Family History  Problem Relation Age of Onset  . Colon cancer Neg Hx   . Liver cancer Neg Hx   . Breast cancer Neg Hx   . Inflammatory bowel disease Neg Hx   . Heart attack Mother     Social History   Social History  . Marital Status: Divorced    Spouse Name: N/A  . Number of Children: 1  . Years of Education: N/A   Occupational History  . unemployed    Social History Main Topics  . Smoking status: Never Smoker   . Smokeless tobacco: Never Used  . Alcohol Use: No  . Drug Use: No  . Sexual Activity: Yes  Birth Control/ Protection: Surgical    Other Topics Concern  . Not on file   Social History Narrative    Review of Systems: Gen: see HPI  CV: Denies chest pain, heart palpitations, syncope, edema  Resp: Denies shortness of breath with rest, cough, wheezing GI: see HPI  GU : +UTI  MS: +joint pain  Derm: Denies rash, itching, dry skin Psych: +depression/anxiety  Heme: Denies bruising, bleeding, and enlarged lymph nodes.  Physical Exam: Vital signs in last 24 hours: Temp:  [97.8 F (36.6 C)-98.5 F (36.9 C)] 98.1 F (36.7 C) (03/20 1048) Pulse Rate:  [59-74] 71 (03/20 1048) Resp:  [15-20] 16 (03/20 1048) BP: (116-174)/(79-106) 116/79 mmHg (03/20 1048) SpO2:  [95 %-100 %] 95 % (03/20 1048) Weight:  [103 lb (46.72 kg)] 103 lb (46.72 kg) (03/19 1510) Last BM Date: 06/08/15 General:   Alert,  Well-developed, well-nourished, pleasant and cooperative in NAD. Appears younger than stated age.  Head:  Normocephalic and atraumatic. Eyes:  Sclera clear, no icterus.   Conjunctiva pink. Ears:  Normal auditory acuity. Nose:  No deformity, discharge,  or lesions. Mouth:  No deformity or lesions, dentition normal. Lungs:  Clear throughout to auscultation.    Heart:  Regular rate and rhythm, possible soft systolic murmur  Abdomen:  Soft, significantly TTP epigastric region but nondistended. No masses, hepatosplenomegaly or hernias noted. No rebound or guarding Rectal:  Deferred  Msk:  Symmetrical without gross deformities. Normal posture. Extremities:  Without edema. Neurologic:  Alert and  oriented x4;  grossly normal neurologically. Psych:  Alert and cooperative. Normal mood and affect.  Intake/Output from previous day:   Intake/Output this shift: Total I/O In: 240 [P.O.:240] Out: -   Lab Results:  Recent Labs  06/08/15 1830 06/09/15 0508  WBC 5.9 5.9  HGB 12.3 10.9*  HCT 38.8 35.2*  PLT 416* 346   BMET  Recent Labs  06/08/15 1830 06/09/15 0508  NA 139 142  K 3.5 3.2*  CL 104 108  CO2 27 25   GLUCOSE 90 84  BUN 12 11  CREATININE 0.57 0.48  CALCIUM 9.1 8.5*   LFT  Recent Labs  06/08/15 1830  PROT 6.7  ALBUMIN 3.7  AST 24  ALT 13*  ALKPHOS 155*  BILITOT 0.5    Studies/Results: CT 05/28/15 reviewed with radiologist, Dr. Zigmund Daniel.   Impression: 60 year old female with complicated past medical history to include PUD resulting in duodenal stricture and need for surgical intervention as noted above. She has had multiple abdominal procedures secondary to complications. Presenting today with recurrent epigastric pain, sensation of fullness, early satiety, nausea, and vomiting. No overt GI bleeding. On methotrexate (which is not on her outpatient medication list). Last EGD in 2014 without evidence of ulcers. CT from 05/28/15 reviewed with Dr. Zigmund Daniel, radiologist. No evidence of mesenteric swirling to indicate internal hernia, no abnormalities with anastomotic sites. 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 Discussed with Dr. Oneida Alar. Will provide around the clock anti-emetic therapy today and proceed with UGI with small bowel follow through tomorrow, March 21st.   Fatty liver: further evaluation as outpatient. Chronically elevated alk phos level. Check GGT, nucleotidase, AMA.   Plan: Clear liquids Zofran scheduled around the clock PPI BID Upper GI with small bowel follow through on 3/21 Recommend outpatient EUS once current illness addressed due to concern for possible chronic pancreatitis Dilaudid increased to every 2 hours instead of every 3 hours.  Check GGT, nucleotidase. AMA now (chronically elevated alk phos).  Orvil Feil, ANP-BC [facility]      LOS: 0 days    06/09/2015, 10:54 AM

## 2015-06-10 ENCOUNTER — Inpatient Hospital Stay (HOSPITAL_COMMUNITY)

## 2015-06-10 LAB — CBC
HEMATOCRIT: 32.5 % — AB (ref 36.0–46.0)
HEMOGLOBIN: 10 g/dL — AB (ref 12.0–15.0)
MCH: 25.3 pg — AB (ref 26.0–34.0)
MCHC: 30.8 g/dL (ref 30.0–36.0)
MCV: 82.3 fL (ref 78.0–100.0)
Platelets: 332 10*3/uL (ref 150–400)
RBC: 3.95 MIL/uL (ref 3.87–5.11)
RDW: 16.4 % — ABNORMAL HIGH (ref 11.5–15.5)
WBC: 4.5 10*3/uL (ref 4.0–10.5)

## 2015-06-10 LAB — BASIC METABOLIC PANEL
ANION GAP: 6 (ref 5–15)
BUN: <5 mg/dL — ABNORMAL LOW (ref 6–20)
CALCIUM: 8.7 mg/dL — AB (ref 8.9–10.3)
CHLORIDE: 108 mmol/L (ref 101–111)
CO2: 29 mmol/L (ref 22–32)
Creatinine, Ser: 0.53 mg/dL (ref 0.44–1.00)
GFR calc non Af Amer: >60 mL/min (ref >60)
GLUCOSE: 82 mg/dL (ref 65–99)
POTASSIUM: 3.9 mmol/L (ref 3.5–5.1)
Sodium: 143 mmol/L (ref 135–145)

## 2015-06-10 LAB — MITOCHONDRIAL ANTIBODIES: Mitochondrial M2 Ab, IgG: 3.2 Units (ref 0.0–20.0)

## 2015-06-10 MED ORDER — ONDANSETRON HCL 4 MG/2ML IJ SOLN
4.0000 mg | Freq: Once | INTRAMUSCULAR | Status: AC
Start: 2015-06-10 — End: 2015-06-10
  Administered 2015-06-10: 4 mg via INTRAVENOUS
  Filled 2015-06-10: qty 2

## 2015-06-10 NOTE — Progress Notes (Signed)
[facility] PROGRESS NOTE  Jordan Haney [id number] DOB: May 30, 1955 DOA: 06/08/2015 PCP: [name]  Assessment/Plan: 60 y/o female with PMH of HTN, RA on Methotrexate, Chronic pain, h/o  Partial Gastrectomy, PUD who was recently admitted for abdominal pains (3/7-3/13) presented with non resolving abdominal pains, associated with nausea, vomiting, and UTI. Awaiting SBFT test today   Nausea/vomiting, abdominal pains. R/o PUD vs stricture. Recent abd CT: no acute findings. Exam mild epigastric tender. No s/s of acute bleeding  -awaiting SBFT today per GI. appreciate the input. cont PPI, IVF, pain control  UTI. Patient is afebrile, cont IV atx. pend cultures   HTN. Stable. Resume lisinopril when able to take PO. Prn hydralazine   Code Status: full Family Communication:  D/w patient (indicate person spoken with, relationship, and if by phone, the number) Disposition Plan: home 2-3 days    Consultants:  GI  Procedures:  None   Antibiotics:  Ceftriaxone 3/20<<< (indicate start date, and stop date if known)  HPI/Subjective: Alert,. No distress   Objective: Filed Vitals:   06/10/15 0629 06/10/15 0800  BP: 142/92 133/90  Pulse: 71 70  Temp: 97.8 F (36.6 C) 98.6 F (37 C)  Resp: 20 16    Intake/Output Summary (Last 24 hours) at 06/10/15 1017 Last data filed at 06/10/15 0400  Gross per 24 hour  Intake   1460 ml  Output      0 ml  Net   1460 ml   Filed Weights   06/08/15 1510  Weight: 46.72 kg (103 lb)    Exam:   General:  No distress   Cardiovascular: s1,s2 rrr  Respiratory: CTA BL   Abdomen: soft, mild tender epigastric, no rebound   Musculoskeletal: no leg edema    Data Reviewed: Basic Metabolic Panel:  Recent Labs Lab 06/08/15 1830 06/09/15 0508 06/10/15 0510  NA 139 142 143  K 3.5 3.2* 3.9  CL 104 108 108  CO2 27 25 29   GLUCOSE 90 84 82  BUN 12 11 <5*  CREATININE 0.57 0.48 0.53  CALCIUM 9.1 8.5* 8.7*   Liver  Function Tests:  Recent Labs Lab 06/08/15 1830  AST 24  ALT 13*  ALKPHOS 155*  BILITOT 0.5  PROT 6.7  ALBUMIN 3.7    Recent Labs Lab 06/08/15 1830  LIPASE 33   No results for input(s): AMMONIA in the last 168 hours. CBC:  Recent Labs Lab 06/08/15 1830 06/09/15 0508 06/10/15 0510  WBC 5.9 5.9 4.5  NEUTROABS 3.5  --   --   HGB 12.3 10.9* 10.0*  HCT 38.8 35.2* 32.5*  MCV 80.2 81.9 82.3  PLT 416* 346 332   Cardiac Enzymes: No results for input(s): CKTOTAL, CKMB, CKMBINDEX, TROPONINI in the last 168 hours. BNP (last 3 results) No results for input(s): BNP in the last 8760 hours.  ProBNP (last 3 results) No results for input(s): PROBNP in the last 8760 hours.  CBG: No results for input(s): GLUCAP in the last 168 hours.  No results found for this or any previous visit (from the past 240 hour(s)).   Studies: No results found.  Scheduled Meds: . ALPRAZolam  0.5 mg Oral BID  . cefTRIAXone (ROCEPHIN)  IV  1 g Intravenous Q24H  . enoxaparin (LOVENOX) injection  40 mg Subcutaneous Q24H  . ondansetron (ZOFRAN) IV  4 mg Intravenous TID WC & HS  . pantoprazole (PROTONIX) IV  40 mg Intravenous Q12H  . PARoxetine  40 mg Oral q morning - 10a  .  zolpidem  5 mg Oral QHS   Continuous Infusions: . 0.9 % NaCl with KCl 20 mEq / L 75 mL/hr at 06/10/15 [id number]    Principal Problem:   Nausea and vomiting Active Problems:   Rheumatoid arthritis ([place])   UTI (lower urinary tract infection)   Epigastric abdominal pain   Essential hypertension   Emesis    Time spent: >35 minutes     Kinnie Feil  [facility] Pager [phone]. If 7PM-7AM, please contact night-coverage at [email or web address], password [facility] 06/10/2015, 10:17 AM  LOS: 1 day

## 2015-06-10 NOTE — Progress Notes (Signed)
    Subjective: Vomiting all morning. Tolerating just a small amount of liquid yesterday. Pain is same.   Objective: Vital signs in last 24 hours: Temp:  [97.8 F (36.6 C)-98.4 F (36.9 C)] 97.8 F (36.6 C) (03/21 0629) Pulse Rate:  [61-71] 71 (03/21 0629) Resp:  [16-20] 20 (03/21 0629) BP: (116-147)/(79-94) 142/92 mmHg (03/21 0629) SpO2:  [95 %-100 %] 98 % (03/21 0629) Last BM Date: 06/08/15 General:   Alert and oriented, appears uncomfortable Head:  Normocephalic and atraumatic. Abdomen:  Bowel sounds present, soft, TTP upper abdomen Msk:  Symmetrical without gross deformities. Normal posture. Extremities:  Without edema. Neurologic:  Alert and  oriented x4;  grossly normal neurologically. Psych:  Alert and cooperative.   Intake/Output from previous day: 03/20 0701 - 03/21 0700 In: 1700 [P.O.:480; I.V.:1220] Out: -  Intake/Output this shift:    Lab Results:  Recent Labs  06/08/15 1830 06/09/15 0508 06/10/15 0510  WBC 5.9 5.9 4.5  HGB 12.3 10.9* 10.0*  HCT 38.8 35.2* 32.5*  PLT 416* 346 332   BMET  Recent Labs  06/08/15 1830 06/09/15 0508 06/10/15 0510  NA 139 142 143  K 3.5 3.2* 3.9  CL 104 108 108  CO2 27 25 29  GLUCOSE 90 84 82  BUN 12 11 <5*  CREATININE 0.57 0.48 0.53  CALCIUM 9.1 8.5* 8.7*   LFT  Recent Labs  06/08/15 1830  PROT 6.7  ALBUMIN 3.7  AST 24  ALT 13*  ALKPHOS 155*  BILITOT 0.5    Assessment: 60-year-old female with complicated past medical history to include PUD resulting in duodenal stricture and need for surgical intervention. She has had multiple abdominal procedures secondary to complications. Presenting this admission with recurrent epigastric pain, sensation of fullness, early satiety, nausea, and vomiting. No overt GI bleeding. On methotrexate (which is not on her outpatient medication list). Last EGD in 2014 without evidence of ulcers. CT from 05/28/15 reviewed with Dr. Maxwell, radiologist. No evidence of mesenteric  swirling to indicate internal hernia, no abnormalities with anastomotic sites. 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 UGI with small bowel follow through scheduled today. Severe nausea this morning.    Fatty liver: further evaluation as outpatient. Chronically elevated alk phos level. Thus far, GGT is normal, nucleotidase and AMA pending.      Plan: UGI with SBFT today Zofran 4 mg IV X 1 now (patient already received Zofran an hour before visit) Continue scheduled Zofran Hold on Reglan until after SBFT (patient has taken this as an outpatient. Has multiple allergies to anti-emetics) May need EGD if SBFT unremarkable   W. Sams, ANP-BC [facility]      LOS: 1 day    06/10/2015, 8:18 AM     

## 2015-06-11 ENCOUNTER — Inpatient Hospital Stay (HOSPITAL_COMMUNITY)

## 2015-06-11 ENCOUNTER — Encounter (HOSPITAL_COMMUNITY)
Admission: EM | Disposition: A | Discharge status: Home / Self Care | Payer: Self-pay | Source: Home / Self Care | Attending: Internal Medicine

## 2015-06-11 ENCOUNTER — Encounter (HOSPITAL_COMMUNITY): Payer: Self-pay

## 2015-06-11 DIAGNOSIS — Z9884 Bariatric surgery status: Secondary | ICD-10-CM

## 2015-06-11 DIAGNOSIS — R1110 Vomiting, unspecified: Secondary | ICD-10-CM

## 2015-06-11 HISTORY — PX: ESOPHAGOGASTRODUODENOSCOPY: SHX5428

## 2015-06-11 LAB — URINE CULTURE: Culture: >=100000

## 2015-06-11 LAB — NUCLEOTIDASE, 5', BLOOD: 5-Nucleotidase: 4 IU/L (ref 0–10)

## 2015-06-11 SURGERY — EGD (ESOPHAGOGASTRODUODENOSCOPY)
Anesthesia: Moderate Sedation

## 2015-06-11 MED ORDER — MEPERIDINE HCL 100 MG/ML IJ SOLN
INTRAMUSCULAR | Status: DC | PRN
  Administered 2015-06-11: 50 mg
  Administered 2015-06-11: 25 mg

## 2015-06-11 MED ORDER — STERILE WATER FOR IRRIGATION IR SOLN
Status: DC | PRN
  Administered 2015-06-11: 13:00:00

## 2015-06-11 MED ORDER — LIDOCAINE VISCOUS 2 % MT SOLN
OROMUCOSAL | Status: DC | PRN
  Administered 2015-06-11: 1 via OROMUCOSAL

## 2015-06-11 MED ORDER — LIDOCAINE VISCOUS 2 % MT SOLN
OROMUCOSAL | Status: AC
  Filled 2015-06-11: qty 15

## 2015-06-11 MED ORDER — MIDAZOLAM HCL 5 MG/5ML IJ SOLN
INTRAMUSCULAR | Status: DC | PRN
  Administered 2015-06-11: 1 mg via INTRAVENOUS
  Administered 2015-06-11: 2 mg via INTRAVENOUS

## 2015-06-11 MED ORDER — SODIUM CHLORIDE 0.9 % IV SOLN
INTRAVENOUS | Status: DC
  Administered 2015-06-11: 13:00:00 via INTRAVENOUS

## 2015-06-11 MED ORDER — LEVOFLOXACIN IN D5W 500 MG/100ML IV SOLN
500.0000 mg | INTRAVENOUS | Status: DC
  Administered 2015-06-11 – 2015-06-13 (×3): 500 mg via INTRAVENOUS
  Filled 2015-06-11 (×3): qty 100

## 2015-06-11 MED ORDER — ONDANSETRON HCL 4 MG/2ML IJ SOLN
INTRAMUSCULAR | Status: AC
  Filled 2015-06-11: qty 2

## 2015-06-11 MED ORDER — MIDAZOLAM HCL 5 MG/5ML IJ SOLN
INTRAMUSCULAR | Status: AC
Start: 2015-06-11 — End: 2015-06-12
  Filled 2015-06-11: qty 10

## 2015-06-11 MED ORDER — MEPERIDINE HCL 100 MG/ML IJ SOLN
INTRAMUSCULAR | Status: AC
  Filled 2015-06-11: qty 2

## 2015-06-11 MED ORDER — ONDANSETRON HCL 4 MG/2ML IJ SOLN
INTRAMUSCULAR | Status: DC | PRN
  Administered 2015-06-11: 4 mg via INTRAVENOUS

## 2015-06-11 NOTE — Progress Notes (Signed)
Subjective:  Ongoing epigastric pain. Vomiting this morning before and after her jello. Denies NSAIDs/ASA.  Objective: Vital signs in last 24 hours: Temp:  [97.6 F (36.4 C)-98.9 F (37.2 C)] 97.9 F (36.6 C) (03/22 0636) Pulse Rate:  [66-78] 78 (03/22 0636) Resp:  [15-20] 16 (03/22 0636) BP: (132-174)/(90-101) 132/92 mmHg (03/22 0636) SpO2:  [97 %-99 %] 97 % (03/22 0636) Last BM Date: 06/10/15 General:   Alert,  Well-developed, well-nourished, pleasant and cooperative in NAD Head:  Normocephalic and atraumatic. Eyes:  Sclera clear, no icterus.  Abdomen:  Soft, moderate epigastric tenderness. Normal bowel sounds, without guarding, and without rebound.   Extremities:  Without clubbing, deformity or edema. Neurologic:  Alert and  oriented x4;  grossly normal neurologically. Skin:  Intact without significant lesions or rashes. Psych:  Alert and cooperative. Normal mood and affect.  Intake/Output from previous day: 03/21 0701 - 03/22 0700 In: 240 [P.O.:240] Out: 1600 [Urine:1600] Intake/Output this shift:    Lab Results: CBC  Recent Labs  06/08/15 1830 06/09/15 0508 06/10/15 0510  WBC 5.9 5.9 4.5  HGB 12.3 10.9* 10.0*  HCT 38.8 35.2* 32.5*  MCV 80.2 81.9 82.3  PLT 416* 346 332   BMET  Recent Labs  06/08/15 1830 06/09/15 0508 06/10/15 0510  NA 139 142 143  K 3.5 3.2* 3.9  CL 104 108 108  CO2 _0 GLUCOSE 90 84 82  BUN 12 11 <5*  CREATININE 0.57 0.48 0.53  CALCIUM 9.1 8.5* 8.7*   LFTs  Recent Labs  06/08/15 1830  BILITOT 0.5  ALKPHOS 155*  AST 24  ALT 13*  PROT 6.7  ALBUMIN 3.7    Recent Labs  06/08/15 1830  LIPASE 33   PT/INR No results for input(s): LABPROT, INR in the last 72 hours.    Imaging Studies: Ct Abdomen Pelvis W Contrast  05/28/2015  CLINICAL DATA:  Abdominal pain. Nausea with vomiting. Prior cholecystectomy. EXAM: CT ABDOMEN AND PELVIS WITH CONTRAST TECHNIQUE: Multidetector CT imaging of the abdomen and pelvis was  performed using the standard protocol following bolus administration of intravenous contrast. CONTRAST:  169m OMNIPAQUE IOHEXOL 300 MG/ML  SOLN COMPARISON:  08/18/2012 FINDINGS: Lower chest:  No acute findings. Hepatobiliary: Severe hepatic steatosis again demonstrated. No liver masses identified. Prior cholecystectomy noted. No evidence of biliary dilatation. Pancreas: No mass, inflammatory changes, or other significant abnormality. Spleen: Within normal limits in size and appearance. Adrenals/Urinary Tract: No masses identified. No evidence of hydronephrosis. Small left renal cyst again noted. Distended urinary bladder. Stomach/Bowel: No evidence of obstruction, inflammatory process, or abnormal fluid collections. Large colonic stool burden again noted. Vascular/Lymphatic: No pathologically enlarged lymph nodes. No evidence of abdominal aortic aneurysm. Reproductive: Prior hysterectomy noted. Adnexal regions are unremarkable in appearance. Other: Left hip prosthesis results in beam hardening artifact through the inferior aspect of the pelvis. Musculoskeletal: No suspicious bone lesions identified. 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 IMPRESSION: Distended urinary bladder ; recommend clinical correlation for urinary retention. No evidence hydronephrosis or other acute findings. Stable severe hepatic steatosis. Large stool burden noted; suggest clinical correlation for possible constipation. Electronically Signed   By: JEarle GellM.D.   On: 05/28/2015 21:39   Dg Ugi W/small Bowel High Density  06/10/2015  CLINICAL DATA:  60year old female with epigastric pain common nausea and vomiting for the past 3 days. 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 Additional history  of small-bowel obstruction status post partial small bowel resection 2 years ago. EXAM: UPPER GI SERIES WITH SMALL BOWEL  FOLLOW-THROUGH FLUOROSCOPY TIME:  If the device does not provide the exposure index: Fluoroscopy Time (in minutes and seconds):  2 minutes and 54 seconds Number of Acquired Images:  28 TECHNIQUE: Combined double contrast and single contrast upper GI series using effervescent crystals, thick barium, and thin barium. Subsequently, serial images of the small bowel were obtained including spot views of the terminal ileum. COMPARISON:  No priors. FINDINGS: Preprocedure KUB demonstrated gas and stool throughout the colon extending to the distal rectum. No pathologic dilatation of small bowel. Suture line in the left side of the abdomen. Numerous surgical clips in the right upper quadrant from prior cholecystectomy. Surgical clip projecting over the right side of the pelvis. Postprocedural changes of vertebroplasty at L3 and L5. Double contrast images of the esophagus demonstrated a normal appearance of the esophageal mucosa. No esophageal mass, stricture or esophageal ring. No hiatal hernia. Esophageal motility was normal. Double contrast images of the stomach are limited secondary to lack of patient mobility, and inability to adequately coat the entire stomach. Postoperative changes of prior Roux-en-Y gastrectomy are noted. Throughout the remaining portions of the stomach there were several areas of subtle nodularity, that could represent small gastric polyps (although some of these findings may be attributable to small gas bubbles in poorly mixed and poorly distributed barium). No definite gastric mass or ulcer was identified. Remaining portions of small bowel were normal in appearance, without focal mucosal abnormality, definite stricture or definite mass. No pathologic dilatation of small bowel. Terminal ileum was normal in appearance. IMPRESSION: 1. No evidence of recurrent bowel obstruction. 2. No acute findings to account for the patient's symptoms. 3. Multiple minor mucosal abnormalities in the stomach that may  suggest the presence of multiple small hyperplastic polyps. 4. Normal appearance of the esophagus. Electronically Signed   By: Vinnie Langton M.D.   On: 06/10/2015 14:16   US Abdomen Limited Ruq  05/28/2015  CLINICAL DATA:  Nausea and vomiting with abdominal pain for 3 days. History of prior cholecystectomy. EXAM: US ABDOMEN LIMITED - RIGHT UPPER QUADRANT COMPARISON:  08/18/2012 abdominal CT FINDINGS: Gallbladder: Cholecystectomy. Common bile duct: Diameter: Mildly dilated after cholecystectomy. Example 13 mm in the porta hepatis, tapering to 11 mm in the pancreatic head. New since 08/18/2012. Liver: Moderate hepatic steatosis.  No intrahepatic duct dilatation. IMPRESSION: 1. Cholecystectomy with mild common duct dilatation, maximally 13 mm. Consider correlation with bilirubin levels. If these are elevated, consider MRCP. 2. Moderate hepatic steatosis. Electronically Signed   By: Abigail Miyamoto M.D.   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 Bowel obstruction requiring exploratory laparotomy and small bowel resection in summer 2014. She has a history of pancreatitis in remote past, felt to be biliary in origin at that time; gallbladder absent now. It was felt she has chronic pancreatitis and EUS had been mentioned but I do not believe this was done; patient denies any prior EUS. Presenting this admission with recurrent epigastric pain, sensation of fullness, early satiety, nausea, and vomiting. No overt GI bleeding. On methotrexate (which is not on her outpatient medication list). Last EGD in 2014 without evidence of ulcers. CT from 05/28/15 reviewed with Dr. Zigmund Daniel, radiologist. No  evidence of mesenteric swirling to indicate internal hernia, no abnormalities with  anastomotic sites. 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 UGI with small bowel follow through yesterday without recurrent bowel obstruction but with multiple minor mucosal abnormalities in the stomach.   Patient has persistent epigastric pain and n/v.  Fatty liver: further evaluation as outpatient. Chronically elevated alk phos level. Thus far, GGT is normal, nucleotidase and AMA pending.   Plan: 1. To discuss with Dr. Gala Romney, may require EGD for persistent n/v.   Laureen Ochs. Bernarda Caffey [facility] [phone] 3/22/201710:07 AM     LOS: 2 days    Addendum: plan for EGD today. Discussed with patient.  I have discussed the risks, alternatives, benefits with regards to but not limited to the risk of reaction to medication, bleeding, infection, perforation and the patient is agreeable to proceed. Written consent to be obtained.

## 2015-06-11 NOTE — Progress Notes (Signed)
[facility] PROGRESS NOTE  KIZ SANTAANA [id number] DOB: 06/30/1955 DOA: 06/08/2015 PCP: [name]  Assessment/Plan: 1. Nausea, vomiting and abdominal pain. Recent CT abdomen did not show any acute findings. EGD done today was unremarkable and did not show any signs of peptic ulcer disease. GI is following. Plans are for MRCP to be done tomorrow to evaluate for any biliary pathology. She'll need to be medicated with Ativan prior to MRI. Continue PPI 2. Enterococcal UTI. Change antibiotics to levofloxacin. 3. Hypertension. Stable. Continue when necessary hydralazine. 4. Anxiety. Continue Xanax  Code Status: Full code Family Communication: Discussed with patient Disposition Plan: Discharge home once improved   Consultants:  Gastroenterology  Procedures: EGD 3/22: - Normal esophagus.  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  Antibiotics:  Levofloxacin 3/22>>  HPI/Subjective: Continues to have abdominal pain, nausea  Objective: Filed Vitals:   06/11/15 1451 06/11/15 1553  BP: 134/89 128/82  Pulse: 64 70  Temp: 98.2 F (36.8 C) 97.5 F (36.4 C)  Resp: 16 16    Intake/Output Summary (Last 24 hours) at 06/11/15 1907 Last data filed at 06/11/15 1700  Gross per 24 hour  Intake    870 ml  Output   1600 ml  Net   -730 ml   Filed Weights   06/08/15 1510  Weight: 46.72 kg (103 lb)    Exam:   General:  NAD  Cardiovascular: S1, S2 RRR  Respiratory: CTA B  Abdomen: soft, tender in epigastric/periumbilical region, bs+  Musculoskeletal: no edema b/l   Data Reviewed: Basic Metabolic Panel:  Recent Labs Lab 06/08/15 1830 06/09/15 0508 06/10/15 0510  NA 139 142 143  K 3.5 3.2* 3.9  CL 104 108 108  CO2 27 25 29   GLUCOSE 90 84 82  BUN 12 11 <5*  CREATININE 0.57 0.48 0.53  CALCIUM 9.1 8.5* 8.7*   Liver Function Tests:  Recent Labs Lab 06/08/15 1830  AST 24  ALT 13*  ALKPHOS 155*   BILITOT 0.5  PROT 6.7  ALBUMIN 3.7    Recent Labs Lab 06/08/15 1830  LIPASE 33   No results for input(s): AMMONIA in the last 168 hours. CBC:  Recent Labs Lab 06/08/15 1830 06/09/15 0508 06/10/15 0510  WBC 5.9 5.9 4.5  NEUTROABS 3.5  --   --   HGB 12.3 10.9* 10.0*  HCT 38.8 35.2* 32.5*  MCV 80.2 81.9 82.3  PLT 416* 346 332   Cardiac Enzymes: No results for input(s): CKTOTAL, CKMB, CKMBINDEX, TROPONINI in the last 168 hours. BNP (last 3 results) No results for input(s): BNP in the last 8760 hours.  ProBNP (last 3 results) No results for input(s): PROBNP in the last 8760 hours.  CBG: No results for input(s): GLUCAP in the last 168 hours.  Recent Results (from the past 240 hour(s))  Urine culture     Status: None   Collection Time: 06/08/15  4:54 PM  Result Value Ref Range Status   Specimen Description URINE, CLEAN CATCH  Final   Special Requests NONE  Final   Culture   Final    >=100,000 COLONIES/mL ENTEROCOCCUS SPECIES Performed at [facility]    Report Status 06/11/2015 FINAL  Final   Organism ID, Bacteria ENTEROCOCCUS SPECIES  Final      Susceptibility   Enterococcus species - MIC*    AMPICILLIN <=2 SENSITIVE Sensitive     LEVOFLOXACIN 1 SENSITIVE Sensitive     NITROFURANTOIN <=16 SENSITIVE Sensitive  VANCOMYCIN 1 SENSITIVE Sensitive     * >=100,000 COLONIES/mL ENTEROCOCCUS SPECIES     Studies: Dg Ugi W/small Bowel High Density  06/10/2015  CLINICAL DATA:  60 year old female with epigastric pain common nausea and vomiting for the past 3 days. 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 Additional history of small-bowel obstruction status post partial small bowel resection 2 years ago. EXAM: UPPER GI SERIES WITH SMALL BOWEL FOLLOW-THROUGH FLUOROSCOPY TIME:  If the device does not provide the exposure index: Fluoroscopy Time (in minutes and seconds):  2 minutes and 54 seconds Number of Acquired Images:  28 TECHNIQUE:  Combined double contrast and single contrast upper GI series using effervescent crystals, thick barium, and thin barium. Subsequently, serial images of the small bowel were obtained including spot views of the terminal ileum. COMPARISON:  No priors. FINDINGS: Preprocedure KUB demonstrated gas and stool throughout the colon extending to the distal rectum. No pathologic dilatation of small bowel. Suture line in the left side of the abdomen. Numerous surgical clips in the right upper quadrant from prior cholecystectomy. Surgical clip projecting over the right side of the pelvis. Postprocedural changes of vertebroplasty at L3 and L5. Double contrast images of the esophagus demonstrated a normal appearance of the esophageal mucosa. No esophageal mass, stricture or esophageal ring. No hiatal hernia. Esophageal motility was normal. Double contrast images of the stomach are limited secondary to lack of patient mobility, and inability to adequately coat the entire stomach. Postoperative changes of prior Roux-en-Y gastrectomy are noted. Throughout the remaining portions of the stomach there were several areas of subtle nodularity, that could represent small gastric polyps (although some of these findings may be attributable to small gas bubbles in poorly mixed and poorly distributed barium). No definite gastric mass or ulcer was identified. Remaining portions of small bowel were normal in appearance, without focal mucosal abnormality, definite stricture or definite mass. No pathologic dilatation of small bowel. Terminal ileum was normal in appearance. IMPRESSION: 1. No evidence of recurrent bowel obstruction. 2. No acute findings to account for the patient's symptoms. 3. Multiple minor mucosal abnormalities in the stomach that may suggest the presence of multiple small hyperplastic polyps. 4. Normal appearance of the esophagus. Electronically Signed   By: Vinnie Langton M.D.   On: 06/10/2015 14:16    Scheduled Meds: .  ALPRAZolam  0.5 mg Oral BID  . enoxaparin (LOVENOX) injection  40 mg Subcutaneous Q24H  . levofloxacin (LEVAQUIN) IV  500 mg Intravenous Q24H  . lidocaine      . meperidine      . midazolam      . ondansetron      . ondansetron (ZOFRAN) IV  4 mg Intravenous TID WC & HS  . pantoprazole (PROTONIX) IV  40 mg Intravenous Q12H  . PARoxetine  40 mg Oral q morning - 10a  . zolpidem  5 mg Oral QHS   Continuous Infusions: . 0.9 % NaCl with KCl 20 mEq / L 75 mL/hr at 06/11/15 0206    Principal Problem:   Nausea and vomiting Active Problems:   Rheumatoid arthritis (HCC)   UTI (lower urinary tract infection)   Epigastric abdominal pain   Essential hypertension   Emesis    Time spent: 20mins    Ancil Dewan  [facility] Pager [phone]. If 7PM-7AM, please contact night-coverage at [email or web address], password [facility] 06/11/2015, 7:07 PM  LOS: 2 days

## 2015-06-12 DIAGNOSIS — K838 Other specified diseases of biliary tract: Secondary | ICD-10-CM | POA: Insufficient documentation

## 2015-06-12 LAB — BASIC METABOLIC PANEL
ANION GAP: 6 (ref 5–15)
CO2: 32 mmol/L (ref 22–32)
Calcium: 8.9 mg/dL (ref 8.9–10.3)
Chloride: 104 mmol/L (ref 101–111)
Creatinine, Ser: 0.6 mg/dL (ref 0.44–1.00)
Glucose, Bld: 90 mg/dL (ref 65–99)
POTASSIUM: 4.1 mmol/L (ref 3.5–5.1)
SODIUM: 142 mmol/L (ref 135–145)

## 2015-06-12 NOTE — Progress Notes (Signed)
Subjective:  Patient doesn't think she can do any kind of closed MRI, even wide-bore, unless she is fully sedated. She continues to vomit after all liquids. Continues to have epigastric pain with po intake. Denies any improvement.   Objective: Vital signs in last 24 hours: Temp:  [97.5 F (36.4 C)-99 F (37.2 C)] 99 F (37.2 C) (03/23 0400) Pulse Rate:  [64-86] 75 (03/23 0400) Resp:  [11-21] 20 (03/23 0400) BP: (128-162)/(81-115) 134/81 mmHg (03/23 0400) SpO2:  [93 %-100 %] 96 % (03/23 0400) Last BM Date: 06/11/15 General:   Alert,  Well-developed, well-nourished, pleasant and cooperative in NAD Head:  Normocephalic and atraumatic. Eyes:  Sclera clear, no icterus.  Abdomen:  Soft, moderate epigastric tenderness and nondistended. Normal bowel sounds, without guarding, and without rebound.   Extremities:  Without clubbing, deformity or edema. Neurologic:  Alert and  oriented x4;  grossly normal neurologically. Skin:  Intact without significant lesions or rashes. Psych:  Alert and cooperative. Normal mood and affect.  Intake/Output from previous day: 03/22 0701 - 03/23 0700 In: 870 [P.O.:720; I.V.:150] Out: 800 [Urine:800] Intake/Output this shift:    Lab Results: CBC  Recent Labs  06/10/15 0510  WBC 4.5  HGB 10.0*  HCT 32.5*  MCV 82.3  PLT 332   BMET  Recent Labs  06/10/15 0510 06/12/15 0524  NA 143 142  K 3.9 4.1  CL 108 104  CO2 29 32  GLUCOSE 82 90  BUN <5* <5*  CREATININE 0.53 0.60  CALCIUM 8.7* 8.9   LFTs No results for input(s): BILITOT, BILIDIR, IBILI, ALKPHOS, AST, ALT, PROT, ALBUMIN in the last 72 hours. No results for input(s): LIPASE in the last 72 hours. PT/INR No results for input(s): LABPROT, INR in the last 72 hours.    Imaging Studies: Ct Abdomen Pelvis W Contrast  05/28/2015  CLINICAL DATA:  Abdominal pain. Nausea with vomiting. Prior cholecystectomy. EXAM: CT ABDOMEN AND PELVIS WITH CONTRAST TECHNIQUE: Multidetector CT imaging of the  abdomen and pelvis was performed using the standard protocol following bolus administration of intravenous contrast. CONTRAST:  166m OMNIPAQUE IOHEXOL 300 MG/ML  SOLN COMPARISON:  08/18/2012 FINDINGS: Lower chest:  No acute findings. Hepatobiliary: Severe hepatic steatosis again demonstrated. No liver masses identified. Prior cholecystectomy noted. No evidence of biliary dilatation. Pancreas: No mass, inflammatory changes, or other significant abnormality. Spleen: Within normal limits in size and appearance. Adrenals/Urinary Tract: No masses identified. No evidence of hydronephrosis. Small left renal cyst again noted. Distended urinary bladder. Stomach/Bowel: No evidence of obstruction, inflammatory process, or abnormal fluid collections. Large colonic stool burden again noted. Vascular/Lymphatic: No pathologically enlarged lymph nodes. No evidence of abdominal aortic aneurysm. Reproductive: Prior hysterectomy noted. Adnexal regions are unremarkable in appearance. Other: Left hip prosthesis results in beam hardening artifact through the inferior aspect of the pelvis. Musculoskeletal: No suspicious bone lesions identified. 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 IMPRESSION: Distended urinary bladder ; recommend clinical correlation for urinary retention. No evidence hydronephrosis or other acute findings. Stable severe hepatic steatosis. Large stool burden noted; suggest clinical correlation for possible constipation. Electronically Signed   By: JEarle GellM.D.   On: 05/28/2015 21:39   Dg Ugi W/small Bowel High Density  06/10/2015  CLINICAL DATA:  60year old female with epigastric pain common nausea and vomiting for the past 3 days. 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 Additional history of small-bowel obstruction status post partial small  bowel resection 2 years ago. EXAM: UPPER GI SERIES WITH  SMALL BOWEL FOLLOW-THROUGH FLUOROSCOPY TIME:  If the device does not provide the exposure index: Fluoroscopy Time (in minutes and seconds):  2 minutes and 54 seconds Number of Acquired Images:  28 TECHNIQUE: Combined double contrast and single contrast upper GI series using effervescent crystals, thick barium, and thin barium. Subsequently, serial images of the small bowel were obtained including spot views of the terminal ileum. COMPARISON:  No priors. FINDINGS: Preprocedure KUB demonstrated gas and stool throughout the colon extending to the distal rectum. No pathologic dilatation of small bowel. Suture line in the left side of the abdomen. Numerous surgical clips in the right upper quadrant from prior cholecystectomy. Surgical clip projecting over the right side of the pelvis. Postprocedural changes of vertebroplasty at L3 and L5. Double contrast images of the esophagus demonstrated a normal appearance of the esophageal mucosa. No esophageal mass, stricture or esophageal ring. No hiatal hernia. Esophageal motility was normal. Double contrast images of the stomach are limited secondary to lack of patient mobility, and inability to adequately coat the entire stomach. Postoperative changes of prior Roux-en-Y gastrectomy are noted. Throughout the remaining portions of the stomach there were several areas of subtle nodularity, that could represent small gastric polyps (although some of these findings may be attributable to small gas bubbles in poorly mixed and poorly distributed barium). No definite gastric mass or ulcer was identified. Remaining portions of small bowel were normal in appearance, without focal mucosal abnormality, definite stricture or definite mass. No pathologic dilatation of small bowel. Terminal ileum was normal in appearance. IMPRESSION: 1. No evidence of recurrent bowel obstruction. 2. No acute findings to account for the patient's symptoms. 3. Multiple minor mucosal abnormalities in the stomach  that may suggest the presence of multiple small hyperplastic polyps. 4. Normal appearance of the esophagus. Electronically Signed   By: Vinnie Langton M.D.   On: 06/10/2015 14:16   US Abdomen Limited Ruq  05/28/2015  CLINICAL DATA:  Nausea and vomiting with abdominal pain for 3 days. History of prior cholecystectomy. EXAM: US ABDOMEN LIMITED - RIGHT UPPER QUADRANT COMPARISON:  08/18/2012 abdominal CT FINDINGS: Gallbladder: Cholecystectomy. Common bile duct: Diameter: Mildly dilated after cholecystectomy. Example 13 mm in the porta hepatis, tapering to 11 mm in the pancreatic head. New since 08/18/2012. Liver: Moderate hepatic steatosis.  No intrahepatic duct dilatation. IMPRESSION: 1. Cholecystectomy with mild common duct dilatation, maximally 13 mm. Consider correlation with bilirubin levels. If these are elevated, consider MRCP. 2. Moderate hepatic steatosis. Electronically Signed   By: Abigail Miyamoto M.D.   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 Bowel obstruction requiring exploratory laparotomy and small bowel resection in summer 2014. She has a history of pancreatitis in remote past, felt to be biliary in origin at that time; gallbladder absent now. It was felt she has chronic pancreatitis and EUS had been mentioned but I do not believe this was done; patient denies any prior EUS. Presenting this admission with recurrent epigastric pain, sensation of fullness, early satiety, nausea, and vomiting. No overt GI bleeding. On methotrexate (which is not on her outpatient medication list). Last EGD in 2014 without evidence of ulcers. CT from 05/28/15 reviewed with Dr. Zigmund Daniel, radiologist. No evidence of mesenteric swirling to indicate internal  hernia, no  abnormalities with anastomotic sites. 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 UGI with small bowel follow through yesterday without recurrent bowel obstruction but with multiple minor mucosal abnormalities in the stomach.   Patient has persistent epigastric pain and n/v. EGD yesterday unrevealing. Plans for MRCP to evaluate dilated bile duct but patient with severe claustrophobia. Spoke with MRI tech, Ronalee Belts and patient reported required full sedation with previous MRI which is not appropriate with MRCP as patient will require breath holding to obtain adequate imaging. Open MRI not recommended for MRCP due to quality of images. He recommends either inpatient or outpatient MRCP at [facility] with wide bore MRI with oral ativan/valium. Discussed with Dr. Gala Romney yesterday, goal was try to do this as outpatient but patient continues to be unable to keep liquids down.  I spoke to Ronalee Belts again today, patient would gain additional 2-3 inches of space above her chest with the wide-bore MRI but patient states she would not tolerate that based her her prior experience unless she is asleep (which is not an option for this study).   Fatty liver: further evaluation as outpatient. Chronically elevated alk phos level. Serologies unremarkable.    Plan: 1. Patient unable to tolerate MRI as outlined in depth above. Will try to arrange EUS.  2. Bariatric diet as tolerated.   Laureen Ochs. Bernarda Caffey [facility] [phone] 3/23/20171:36 PM     LOS: 3 days    Addendum: discussed possible EUS with Dr. Oretha Caprice, may not be able to see bile duct given her altered anatomy but would be worth a try. First available EUS is in about two weeks.   Laureen Ochs. Bernarda Caffey [facility] [phone] 3/23/20172:43 PM

## 2015-06-12 NOTE — Op Note (Signed)
[facility] Patient Name: Jordan Haney Procedure Date: 06/11/2015 12:55 PM MRN: [id number] Date of Birth: [birth year] Attending MD: Norvel Richards , MD CSN: [id number] Age: 60 Admit Type: Inpatient Procedure:                Upper GI endoscopy Indications:              Persistent vomiting of unknown cause, Epigastric                            abdominal pain Providers:                Norvel Richards, MD, Janeece Riggers, RN, Isabella Stalling, Technician Referring MD:             Roderic Palau Medicines:                Midazolam 3 mg IV, Meperidine 75 mg IV, Ondansetron                            4 mg IV Complications:            No immediate complications. Estimated Blood Loss:     Estimated blood loss: none. Procedure:                Pre-Anesthesia Assessment:                           - Prior to the procedure, a History and Physical                            was performed, and patient medications and                            allergies were reviewed. The patient's tolerance of                            previous anesthesia was also reviewed. The risks                            and benefits of the procedure and the sedation                            options and risks were discussed with the patient.                            All questions were answered, and informed consent                            was obtained. Prior Anticoagulants: The patient has                            taken no previous anticoagulant or antiplatelet  agents. ASA Grade Assessment: III - A patient with                            severe systemic disease. After reviewing the risks                            and benefits, the patient was deemed in                            satisfactory condition to undergo the procedure.                           After obtaining informed consent, the endoscope was                            passed under direct vision.  Throughout the                            procedure, the patient's blood pressure, pulse, and                            oxygen saturations were monitored continuously. The                            [id number] scope was introduced through the                            mouth, and advanced to the efferent jejunal loop.                            The upper GI endoscopy was accomplished without                            difficulty. The patient tolerated the procedure                            well. Scope In: 1:38:20 PM Scope Out: 1:44:05 PM Total Procedure Duration: 0 hours 5 minutes 45 seconds  Findings:      The examined esophagus was normal.      Evidence of a Roux-en-Y anastomosis was found in the stomach. This was       characterized by healthy appearing mucosa. Stomach empty. Patent       efferent limb Impression:               - Normal esophagus.                           - A Roux-en-Y anastomosis was found, characterized                            by healthy appearing mucosa.                           - No specimens collected.  Patient has a mildly elevated alkaline phosphatase.                            History of biliary pancreatitis. Common bile duct                            dilated on ultrasound but appears less so on CT. I                            reviewed imaging studies with Dr. Production manager. Given                            her ongoing symptoms, we should rule out an occult                            common duct stone. Will proceed with an MRCP to                            evaluate patient further for an occult common duct                            stone. at patient's request, I discussed my                            findings and recommendations with the patient's                            fianc Donnajean Lopes at [phone] Moderate Sedation:      Moderate (conscious) sedation was administered by the endoscopy nurse       and  supervised by the endoscopist. The following parameters were       monitored: oxygen saturation, heart rate, blood pressure, respiratory       rate, EKG, adequacy of pulmonary ventilation, and response to care.       Total physician intraservice time was 16 minutes. Recommendation:           - Patient has a contact number available for                            emergencies. The signs and symptoms of potential                            delayed complications were discussed with the                            patient. Return to normal activities tomorrow.                            Written discharge instructions were provided to the                            patient.  We'll keep her on a clear liquid diet. Proceed with                            an MRCP to further evaluate her bile duct. Further                            recommendations to follow.                           - Continue present medications. Procedure Code(s):        --- Professional ---                           [phone], Esophagogastroduodenoscopy, flexible,                            transoral; diagnostic, including collection of                            specimen(s) by brushing or washing, when performed                            (separate procedure)                           99152, Moderate sedation services provided by the                            same physician or other qualified health care                            professional performing the diagnostic or                            therapeutic service that the sedation supports,                            requiring the presence of an independent trained                            observer to assist in the monitoring of the                            patient's level of consciousness and physiological                            status; initial 15 minutes of intraservice time,                            patient age 50 years or older Diagnosis  Code(s):        --- Professional ---                           [phone], Bariatric surgery status  R11.10, Vomiting, unspecified                           R10.13, Epigastric pain CPT copyright 2016 [employer]. All rights reserved. The codes documented in this report are preliminary and upon coder review may  be revised to meet current compliance requirements. Cristopher Estimable. Zacaria Pousson, MD Norvel Richards, MD 06/11/2015 2:24:39 PM This report has been signed electronically. Number of Addenda: 0

## 2015-06-12 NOTE — Progress Notes (Signed)
[facility] PROGRESS NOTE  Jordan Haney [id number] DOB: 06/19/1955 DOA: 06/08/2015 PCP: [name]  Assessment/Plan: 1. Nausea, vomiting and abdominal pain. Recent CT abdomen did not show any acute findings. EGD done 3/22 was unremarkable and did not show any signs of peptic ulcer disease. GI is following. MRCP may not be a viable option since patient reports she needs to be completely sedated to undergo MRI and would not be able to hold her breath on command. Other options include EUS. Appreciate GI assistance. Continue scheduled zofran q6h. Continue PPI 2. Enterococcal UTI. Urine Cx showed enterococcus species. Continue levofloxacin.  3. Hypertension. Stable. Continue when necessary hydralazine. 4. Anxiety. Continue Xanax  Code Status: Full code DVT prophylaxis: Lovenox Family Communication: Discussed with patient Disposition Plan: Discharge home once improved   Consultants:  Gastroenterology  Procedures: EGD 3/22: - Normal esophagus.  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  Antibiotics:  Levofloxacin 3/22>>  HPI/Subjective: Patient reports intractable vomiting with any po intake  Objective: Filed Vitals:   06/12/15 0000 06/12/15 0400  BP: 139/95 134/81  Pulse: 71 75  Temp: 98.4 F (36.9 C) 99 F (37.2 C)  Resp: 20 20    Intake/Output Summary (Last 24 hours) at 06/12/15 0634 Last data filed at 06/12/15 0443  Gross per 24 hour  Intake    870 ml  Output    800 ml  Net     70 ml   Filed Weights   06/08/15 1510  Weight: 46.72 kg (103 lb)    Exam:  General: NAD, looks comfortable Cardiovascular: RRR, S1, S2  Respiratory: CTA B Abdomen: soft, diffusely tender, no distention , bowel sounds normal Musculoskeletal: No edema b/l   Data Reviewed: Basic Metabolic Panel:  Recent Labs Lab 06/08/15 1830 06/09/15 0508 06/10/15 0510 06/12/15 0524  NA 139 142 143 142  K 3.5 3.2* 3.9 4.1  CL  104 108 108 104  CO2 27 25 29  32  GLUCOSE 90 84 82 90  BUN 12 11 <5* <5*  CREATININE 0.57 0.48 0.53 0.60  CALCIUM 9.1 8.5* 8.7* 8.9   Liver Function Tests:  Recent Labs Lab 06/08/15 1830  AST 24  ALT 13*  ALKPHOS 155*  BILITOT 0.5  PROT 6.7  ALBUMIN 3.7    Recent Labs Lab 06/08/15 1830  LIPASE 33   No results for input(s): AMMONIA in the last 168 hours. CBC:  Recent Labs Lab 06/08/15 1830 06/09/15 0508 06/10/15 0510  WBC 5.9 5.9 4.5  NEUTROABS 3.5  --   --   HGB 12.3 10.9* 10.0*  HCT 38.8 35.2* 32.5*  MCV 80.2 81.9 82.3  PLT 416* 346 332   Cardiac Enzymes: No results for input(s): CKTOTAL, CKMB, CKMBINDEX, TROPONINI in the last 168 hours. BNP (last 3 results) No results for input(s): BNP in the last 8760 hours.  ProBNP (last 3 results) No results for input(s): PROBNP in the last 8760 hours.  CBG: No results for input(s): GLUCAP in the last 168 hours.  Recent Results (from the past 240 hour(s))  Urine culture     Status: None   Collection Time: 06/08/15  4:54 PM  Result Value Ref Range Status   Specimen Description URINE, CLEAN CATCH  Final   Special Requests NONE  Final   Culture   Final    >=100,000 COLONIES/mL ENTEROCOCCUS SPECIES Performed at [facility]    Report Status 06/11/2015 FINAL  Final   Organism ID, Bacteria ENTEROCOCCUS SPECIES  Final      Susceptibility   Enterococcus species - MIC*    AMPICILLIN <=2 SENSITIVE Sensitive     LEVOFLOXACIN 1 SENSITIVE Sensitive     NITROFURANTOIN <=16 SENSITIVE Sensitive     VANCOMYCIN 1 SENSITIVE Sensitive     * >=100,000 COLONIES/mL ENTEROCOCCUS SPECIES     Studies: Dg Ugi W/small Bowel High Density  06/10/2015  CLINICAL DATA:  60 year old female with epigastric pain common nausea and vomiting for the past 3 days. 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 Additional history of small-bowel obstruction status post partial small bowel resection 2 years ago.  EXAM: UPPER GI SERIES WITH SMALL BOWEL FOLLOW-THROUGH FLUOROSCOPY TIME:  If the device does not provide the exposure index: Fluoroscopy Time (in minutes and seconds):  2 minutes and 54 seconds Number of Acquired Images:  28 TECHNIQUE: Combined double contrast and single contrast upper GI series using effervescent crystals, thick barium, and thin barium. Subsequently, serial images of the small bowel were obtained including spot views of the terminal ileum. COMPARISON:  No priors. FINDINGS: Preprocedure KUB demonstrated gas and stool throughout the colon extending to the distal rectum. No pathologic dilatation of small bowel. Suture line in the left side of the abdomen. Numerous surgical clips in the right upper quadrant from prior cholecystectomy. Surgical clip projecting over the right side of the pelvis. Postprocedural changes of vertebroplasty at L3 and L5. Double contrast images of the esophagus demonstrated a normal appearance of the esophageal mucosa. No esophageal mass, stricture or esophageal ring. No hiatal hernia. Esophageal motility was normal. Double contrast images of the stomach are limited secondary to lack of patient mobility, and inability to adequately coat the entire stomach. Postoperative changes of prior Roux-en-Y gastrectomy are noted. Throughout the remaining portions of the stomach there were several areas of subtle nodularity, that could represent small gastric polyps (although some of these findings may be attributable to small gas bubbles in poorly mixed and poorly distributed barium). No definite gastric mass or ulcer was identified. Remaining portions of small bowel were normal in appearance, without focal mucosal abnormality, definite stricture or definite mass. No pathologic dilatation of small bowel. Terminal ileum was normal in appearance. IMPRESSION: 1. No evidence of recurrent bowel obstruction. 2. No acute findings to account for the patient's symptoms. 3. Multiple minor mucosal  abnormalities in the stomach that may suggest the presence of multiple small hyperplastic polyps. 4. Normal appearance of the esophagus. Electronically Signed   By: Vinnie Langton M.D.   On: 06/10/2015 14:16    Scheduled Meds: . ALPRAZolam  0.5 mg Oral BID  . enoxaparin (LOVENOX) injection  40 mg Subcutaneous Q24H  . levofloxacin (LEVAQUIN) IV  500 mg Intravenous Q24H  . ondansetron (ZOFRAN) IV  4 mg Intravenous TID WC & HS  . pantoprazole (PROTONIX) IV  40 mg Intravenous Q12H  . PARoxetine  40 mg Oral q morning - 10a  . zolpidem  5 mg Oral QHS   Continuous Infusions: . 0.9 % NaCl with KCl 20 mEq / L 75 mL/hr at 06/12/15 0414    Principal Problem:   Nausea and vomiting Active Problems:   Rheumatoid arthritis (HCC)   UTI (lower urinary tract infection)   Epigastric abdominal pain   Essential hypertension   Emesis    Time spent: 25 mins    Kathie Dike, MD.  [facility] Pager [phone]. If 7PM-7AM, please contact night-coverage at [email or web address], password [facility] 06/12/2015, 6:34 AM  LOS: 3 days

## 2015-06-13 MED ORDER — PHENAZOPYRIDINE HCL 100 MG PO TABS
100.0000 mg | ORAL_TABLET | Freq: Three times a day (TID) | ORAL | Status: AC
  Administered 2015-06-14 – 2015-06-16 (×8): 100 mg via ORAL
  Filled 2015-06-13 (×9): qty 1

## 2015-06-13 MED ORDER — METOCLOPRAMIDE HCL 10 MG PO TABS
5.0000 mg | ORAL_TABLET | Freq: Three times a day (TID) | ORAL | Status: DC
  Administered 2015-06-13 – 2015-06-15 (×7): 5 mg via ORAL
  Filled 2015-06-13 (×7): qty 1

## 2015-06-13 NOTE — Progress Notes (Signed)
Subjective: Continues with significant abdominal pain, N/V. Last episode of emesis just before vitis, before that was last night. Continues to resist MRI. Seems pitiful laying in the bed, appears in pain. Confirms she has had Reglan before which helped her nausea/vomiting at that time, unsure of exactly when. Denies any adverse drug effects with Reglan including tremors.  Objective: Vital signs in last 24 hours: Temp:  [98.7 F (37.1 C)-99.1 F (37.3 C)] 98.7 F (37.1 C) (03/24 0500) Pulse Rate:  [67-71] 71 (03/24 0500) Resp:  [16-20] 16 (03/24 0500) BP: (139-156)/(74-99) 141/87 mmHg (03/24 0500) SpO2:  [94 %-97 %] 95 % (03/24 0500) Last BM Date: 06/09/15 General:   Alert and oriented. Eyes:  No icterus, sclera clear. Conjuctiva pink.  Heart:  S1, S2 present, no murmurs noted.  Lungs: Clear to auscultation bilaterally, without wheezing, rales, or rhonchi.  Abdomen:  Bowel sounds present, soft, non-distended. Worsening mid-abdominal pain with light palpation. No HSM or hernias noted. Msk:  Symmetrical without gross deformities. Extremities:  Without clubbing or edema. Neurologic:  Alert and  oriented x4;  grossly normal neurologically. Psych:  Alert and cooperative. Normal mood and affect.  Intake/Output from previous day: 03/23 0701 - 03/24 0700 In: 1825 [I.V.:1725; IV Piggyback:100] Out: 1800 [Urine:1800] Intake/Output this shift:    Lab Results: No results for input(s): WBC, HGB, HCT, PLT in the last 72 hours. BMET  Recent Labs  06/12/15 0524  NA 142  K 4.1  CL 104  CO2 32  GLUCOSE 90  BUN <5*  CREATININE 0.60  CALCIUM 8.9   LFT No results for input(s): PROT, ALBUMIN, AST, ALT, ALKPHOS, BILITOT, BILIDIR, IBILI in the last 72 hours. PT/INR No results for input(s): LABPROT, INR in the last 72 hours. Hepatitis Panel No results for input(s): HEPBSAG, HCVAB, HEPAIGM, HEPBIGM in the last 72 hours.   Studies/Results: No results  found.  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 Bowel obstruction requiring exploratory laparotomy and small bowel resection in summer 2014. She has a history of pancreatitis in remote past, felt to be biliary in origin at that time; gallbladder absent now. It was felt she has chronic pancreatitis and EUS had been mentioned but I do not believe this was done; patient denies any prior EUS. Presenting this admission with recurrent epigastric pain, sensation of fullness, early satiety, nausea, and vomiting. No overt GI bleeding. On methotrexate (which is not on her outpatient medication list). Last EGD in 2014 without evidence of ulcers. CT from 05/28/15 reviewed with Dr. Zigmund Daniel, radiologist. No evidence of mesenteric swirling to indicate internal hernia, no abnormalities with anastomotic sites. 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 UGI with small bowel follow through yesterday without recurrent bowel obstruction but with multiple minor mucosal abnormalities in the stomach.   Patient has persistent epigastric pain and n/v. EGD 3/22 unrevealing. Plans for MRCP to evaluate dilated bile duct but patient with severe claustrophobia. Spoke with MRI tech, Ronalee Belts and patient reported required full sedation with previous MRI which is not appropriate with MRCP as patient will require breath holding to obtain adequate imaging. Open MRI not available in [place] anymore due to poor quality of images. He recommends either inpatient or outpatient MRCP at [facility] with wide bore MRI with oral ativan/valium. Patient declined this due to claustrophobia unless "asleep" which is not possible  for this study due to needed breath holds.  Continued abdominal pain, N/V today despite scheduled  Zofran. Has taken Reglan before without ADEs. BMP normal today, CBC with continued mild anemia possible from hydration effect (1725 mL IVF yesterday). GGT, mitochondrial ab, Neucleotidase 5' all normal on 3/20.   Plan: 1. Continue scheduled Zofran 2. Add Reglan 5 mg ac and hs in po form 3. Continue supportive measures 4. Per Dr. Gala Romney note, transfer to tertiary care referral center an option if continued no improvement and unable to keep food/liquids down. 5. Eventual outpatient evaluation for fatty liver.   Walden Field, AGNP-C Adult & Gerontological Nurse Practitioner [facility]    LOS: 4 days    06/13/2015, 9:00 AM

## 2015-06-13 NOTE — Progress Notes (Addendum)
[facility] PROGRESS NOTE  Jordan Haney [id number] DOB: 1955/09/09 DOA: 06/08/2015 PCP: [name]  Assessment/Plan: 1. Nausea, vomiting and abdominal pain. Recent CT abdomen did not show any acute findings. EGD done 3/22 was unremarkable and did not show any signs of peptic ulcer disease. GI is following. MRCP may not be a viable option since patient reports she needs to be completely sedated to undergo MRI and would not be able to hold her breath on command. Other options include EUS. Appreciate GI assistance. Continue scheduled zofran q6h. Continue PPI. Patient started on reglan today. Continue to monitor for improvement 2. Enterococcal UTI. Urine Cx showed enterococcus species. Treated for 3 days with levofloxacin.  3. Hypertension. Stable. Continue when necessary hydralazine. 4. Anxiety. Continue Xanax  Code Status: Full code DVT prophylaxis: Lovenox Family Communication: Discussed with patient Disposition Plan: Discharge home once improved   Consultants:  Gastroenterology  Procedures: EGD 3/22: - Normal esophagus.  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  Antibiotics:  Levofloxacin 3/22>>3/24  HPI/Subjective: Continues to have vomiting and abdominal pain. Says she is having bowel movements  Objective: Filed Vitals:   06/12/15 2000 06/13/15 0500  BP: 139/99 141/87  Pulse: 71 71  Temp: 99.1 F (37.3 C) 98.7 F (37.1 C)  Resp: 16 16    Intake/Output Summary (Last 24 hours) at 06/13/15 0645 Last data filed at 06/13/15 0500  Gross per 24 hour  Intake 913.75 ml  Output   1800 ml  Net -886.25 ml   Filed Weights   06/08/15 1510  Weight: 46.72 kg (103 lb)    Exam:  General: NAD, looks comfortable Cardiovascular: RRR, S1, S2  Respiratory: clear bilaterally, No wheezing, rales or rhonchi Abdomen: soft, epigastric tenderness, no distention , bowel sounds normal Musculoskeletal: No edema  b/l   Data Reviewed: Basic Metabolic Panel:  Recent Labs Lab 06/08/15 1830 06/09/15 0508 06/10/15 0510 06/12/15 0524  NA 139 142 143 142  K 3.5 3.2* 3.9 4.1  CL 104 108 108 104  CO2 27 25 29  32  GLUCOSE 90 84 82 90  BUN 12 11 <5* <5*  CREATININE 0.57 0.48 0.53 0.60  CALCIUM 9.1 8.5* 8.7* 8.9   Liver Function Tests:  Recent Labs Lab 06/08/15 1830  AST 24  ALT 13*  ALKPHOS 155*  BILITOT 0.5  PROT 6.7  ALBUMIN 3.7    Recent Labs Lab 06/08/15 1830  LIPASE 33   No results for input(s): AMMONIA in the last 168 hours. CBC:  Recent Labs Lab 06/08/15 1830 06/09/15 0508 06/10/15 0510  WBC 5.9 5.9 4.5  NEUTROABS 3.5  --   --   HGB 12.3 10.9* 10.0*  HCT 38.8 35.2* 32.5*  MCV 80.2 81.9 82.3  PLT 416* 346 332   Cardiac Enzymes: No results for input(s): CKTOTAL, CKMB, CKMBINDEX, TROPONINI in the last 168 hours. BNP (last 3 results) No results for input(s): BNP in the last 8760 hours.  ProBNP (last 3 results) No results for input(s): PROBNP in the last 8760 hours.  CBG: No results for input(s): GLUCAP in the last 168 hours.  Recent Results (from the past 240 hour(s))  Urine culture     Status: None   Collection Time: 06/08/15  4:54 PM  Result Value Ref Range Status   Specimen Description URINE, CLEAN CATCH  Final   Special Requests NONE  Final   Culture   Final    >=100,000 COLONIES/mL ENTEROCOCCUS SPECIES Performed at [facility]  Report Status 06/11/2015 FINAL  Final   Organism ID, Bacteria ENTEROCOCCUS SPECIES  Final      Susceptibility   Enterococcus species - MIC*    AMPICILLIN <=2 SENSITIVE Sensitive     LEVOFLOXACIN 1 SENSITIVE Sensitive     NITROFURANTOIN <=16 SENSITIVE Sensitive     VANCOMYCIN 1 SENSITIVE Sensitive     * >=100,000 COLONIES/mL ENTEROCOCCUS SPECIES     Studies: No results found.  Scheduled Meds: . ALPRAZolam  0.5 mg Oral BID  . enoxaparin (LOVENOX) injection  40 mg Subcutaneous Q24H  . levofloxacin  (LEVAQUIN) IV  500 mg Intravenous Q24H  . ondansetron (ZOFRAN) IV  4 mg Intravenous TID WC & HS  . pantoprazole (PROTONIX) IV  40 mg Intravenous Q12H  . PARoxetine  40 mg Oral q morning - 10a  . zolpidem  5 mg Oral QHS   Continuous Infusions: . 0.9 % NaCl with KCl 20 mEq / L 75 mL/hr at 06/13/15 [id number]    Principal Problem:   Nausea and vomiting Active Problems:   Rheumatoid arthritis (HCC)   UTI (lower urinary tract infection)   Epigastric abdominal pain   Essential hypertension   Emesis   Dilated bile duct    Time spent: 25 mins    Kathie Dike, MD.  [facility] Pager [phone]. If 7PM-7AM, please contact night-coverage at [email or web address], password [facility] 06/13/2015, 6:45 AM  LOS: 4 days

## 2015-06-14 DIAGNOSIS — K838 Other specified diseases of biliary tract: Secondary | ICD-10-CM

## 2015-06-14 LAB — HEPATIC FUNCTION PANEL
ALT: 11 U/L — ABNORMAL LOW (ref 14–54)
AST: 19 U/L (ref 15–41)
Albumin: 3.1 g/dL — ABNORMAL LOW (ref 3.5–5.0)
Alkaline Phosphatase: 118 U/L (ref 38–126)
BILIRUBIN DIRECT: 0.1 mg/dL (ref 0.1–0.5)
BILIRUBIN INDIRECT: 0.4 mg/dL (ref 0.3–0.9)
BILIRUBIN TOTAL: 0.5 mg/dL (ref 0.3–1.2)
Total Protein: 5.9 g/dL — ABNORMAL LOW (ref 6.5–8.1)

## 2015-06-14 NOTE — Progress Notes (Signed)
[facility] PROGRESS NOTE  Jordan Haney [id number] DOB: 03-23-1955 DOA: 06/08/2015 PCP: [name]  Assessment/Plan: 1. Nausea, vomiting and abdominal pain. Recent CT abdomen did not show any acute findings. EGD done 3/22 was unremarkable and did not show any signs of peptic ulcer disease. GI is following. MRCP may not be a viable option since patient reports she needs to be completely sedated to undergo MRI and would not be able to hold her breath on command. Other options include EUS. Appreciate GI assistance. Continue scheduled zofran q6h. Continue PPI. Continue reglan. Patient reports some improvement in her symptoms after starting reglan. Will continue with current treatments and monitor. 2. Enterococcal UTI. Urine Cx showed enterococcus species. Treated for 3 days with levofloxacin.  3. Hypertension. Stable. Continue when necessary hydralazine. 4. Anxiety. Continue Xanax  Code Status: Full code DVT prophylaxis: Lovenox Family Communication: Discussed with patient  Disposition Plan: Discharge home once improved   Consultants:  Gastroenterology  Procedures: EGD 3/22: - Normal esophagus.  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  Antibiotics:  Levofloxacin 3/22>>3/24  HPI/Subjective: Patient had one episode of vomiting earlier today, feels that nausea is improving. Continues to have abdominal pain.  Objective: Filed Vitals:   06/13/15 0500 06/13/15 2126  BP: 141/87 155/98  Pulse: 71 63  Temp: 98.7 F (37.1 C) 97.7 F (36.5 C)  Resp: 16 20    Intake/Output Summary (Last 24 hours) at 06/14/15 0717 Last data filed at 06/13/15 2128  Gross per 24 hour  Intake    480 ml  Output   1100 ml  Net   -620 ml   Filed Weights   06/08/15 1510  Weight: 46.72 kg (103 lb)    Exam:  General: NAD, looks comfortable Cardiovascular: RRR, S1, S2  Respiratory: clear bilaterally, No wheezing, rales or  rhonchi Abdomen: soft, non tender, no distention , bowel sounds normal Musculoskeletal: No edema b/l   Data Reviewed: Basic Metabolic Panel:  Recent Labs Lab 06/08/15 1830 06/09/15 0508 06/10/15 0510 06/12/15 0524  NA 139 142 143 142  K 3.5 3.2* 3.9 4.1  CL 104 108 108 104  CO2 27 25 29  32  GLUCOSE 90 84 82 90  BUN 12 11 <5* <5*  CREATININE 0.57 0.48 0.53 0.60  CALCIUM 9.1 8.5* 8.7* 8.9   Liver Function Tests:  Recent Labs Lab 06/08/15 1830  AST 24  ALT 13*  ALKPHOS 155*  BILITOT 0.5  PROT 6.7  ALBUMIN 3.7    Recent Labs Lab 06/08/15 1830  LIPASE 33   No results for input(s): AMMONIA in the last 168 hours. CBC:  Recent Labs Lab 06/08/15 1830 06/09/15 0508 06/10/15 0510  WBC 5.9 5.9 4.5  NEUTROABS 3.5  --   --   HGB 12.3 10.9* 10.0*  HCT 38.8 35.2* 32.5*  MCV 80.2 81.9 82.3  PLT 416* 346 332   Cardiac Enzymes: No results for input(s): CKTOTAL, CKMB, CKMBINDEX, TROPONINI in the last 168 hours. BNP (last 3 results) No results for input(s): BNP in the last 8760 hours.  ProBNP (last 3 results) No results for input(s): PROBNP in the last 8760 hours.  CBG: No results for input(s): GLUCAP in the last 168 hours.  Recent Results (from the past 240 hour(s))  Urine culture     Status: None   Collection Time: 06/08/15  4:54 PM  Result Value Ref Range Status   Specimen Description URINE, CLEAN CATCH  Final   Special Requests NONE  Final   Culture   Final    >=100,000 COLONIES/mL ENTEROCOCCUS SPECIES Performed at [facility]    Report Status 06/11/2015 FINAL  Final   Organism ID, Bacteria ENTEROCOCCUS SPECIES  Final      Susceptibility   Enterococcus species - MIC*    AMPICILLIN <=2 SENSITIVE Sensitive     LEVOFLOXACIN 1 SENSITIVE Sensitive     NITROFURANTOIN <=16 SENSITIVE Sensitive     VANCOMYCIN 1 SENSITIVE Sensitive     * >=100,000 COLONIES/mL ENTEROCOCCUS SPECIES     Studies: No results found.  Scheduled Meds: . ALPRAZolam  0.5  mg Oral BID  . enoxaparin (LOVENOX) injection  40 mg Subcutaneous Q24H  . metoCLOPramide  5 mg Oral TID AC & HS  . ondansetron (ZOFRAN) IV  4 mg Intravenous TID WC & HS  . pantoprazole (PROTONIX) IV  40 mg Intravenous Q12H  . PARoxetine  40 mg Oral q morning - 10a  . phenazopyridine  100 mg Oral TID WC  . zolpidem  5 mg Oral QHS   Continuous Infusions: . 0.9 % NaCl with KCl 20 mEq / L 75 mL/hr at 06/14/15 [id number]    Principal Problem:   Nausea and vomiting Active Problems:   Rheumatoid arthritis (HCC)   UTI (lower urinary tract infection)   Epigastric abdominal pain   Essential hypertension   Emesis   Dilated bile duct    Time spent: 25 mins    Kathie Dike, MD.  [facility] Pager [phone]. If 7PM-7AM, please contact night-coverage at [email or web address], password [facility] 06/14/2015, 7:17 AM  LOS: 5 days

## 2015-06-14 NOTE — Progress Notes (Addendum)
Patient states she still having nausea and vomiting. Some abdominal pain associated with heaving. Denies any side effects with Reglan which was started yesterday.  Vital signs in last 24 hours: Temp:  [97.7 F (36.5 C)] 97.7 F (36.5 C) (03/24 2126) Pulse Rate:  [63] 63 (03/24 2126) Resp:  [20] 20 (03/24 2126) BP: (155)/(98) 155/98 mmHg (03/24 2126) SpO2:  [99 %] 99 % (03/24 2126) Last BM Date: 06/13/15 General:   Tearful, anxious appearing. Awake, alert and cooperative. Otherwise, appears in no acute distress. Abdomen:  Nondistended. Positive bowel sounds. Patient with only mild diffuse tenderness to palpation. No succussion splash. Extremities:  Without clubbing or edema.    Intake/Output from previous day: 03/24 0701 - 03/25 0700 In: 480 [P.O.:480] Out: 1100 [Urine:1100] Intake/Output this shift:    Lab Results: No results for input(s): WBC, HGB, HCT, PLT in the last 72 hours. BMET  Recent Labs  06/12/15 0524  NA 142  K 4.1  CL 104  CO2 32  GLUCOSE 90  BUN <5*  CREATININE 0.60  CALCIUM 8.9   LFT  Recent Labs  06/14/15 0643  PROT 5.9*  ALBUMIN 3.1*  AST 19  ALT 11*  ALKPHOS 118  BILITOT 0.5  BILIDIR 0.1  IBILI 0.4  Impression:   Pleasant 60 year old lady with nausea vomiting, vague abdominal pain (which is likely more related to repeated heaving than anything else at this time). Findings of EGD and CT reviewed. She just started Reglan. Tolerating it well without apparent side effects. Appears quite anxious and tearful today. Given normal alkaline phosphatase, bilirubin and transaminases I doubt significant biliary process. At this point, I would not pursue biliary evaluation further. The possibility of a functional component needs to be In mind.  Recommendations:   Continue supportive measures as outlined scheduled Zofran. Reglan empirically. Patient to be reassessed.

## 2015-06-15 MED ORDER — METOCLOPRAMIDE HCL 5 MG/ML IJ SOLN
5.0000 mg | Freq: Four times a day (QID) | INTRAMUSCULAR | Status: DC
  Administered 2015-06-15 – 2015-06-17 (×9): 5 mg via INTRAVENOUS
  Filled 2015-06-15 (×9): qty 2

## 2015-06-15 NOTE — Progress Notes (Signed)
[facility] PROGRESS NOTE  Jordan Haney [id number] DOB: 1955-04-16 DOA: 06/08/2015 PCP: [name]  Assessment/Plan: 1. Nausea, vomiting and abdominal pain. Recent CT abdomen did not show any acute findings. EGD done 3/22 was unremarkable and did not show any signs of peptic ulcer disease. GI is following. MRCP may not be a viable option since patient reports she needs to be completely sedated to undergo MRI and would not be able to hold her breath on command. Other options include EUS. Appreciate GI assistance. Continue scheduled zofran q6h. Continue PPI. Patient reports some improvement in her symptoms after starting reglan. She continues to throw up and may not be absorbing Reglan effectively. Will change Reglan to IV. Appreciate GI assistance. Will continue with other treatments and monitor. 2. Enterococcal UTI. Urine Cx showed enterococcus species. Treated for 3 days with levofloxacin.  3. Hypertension. Stable. Continue when necessary hydralazine. 4. Anxiety. Continue Xanax  Code Status: Full code DVT prophylaxis: Lovenox Family Communication: Discussed with patient  Disposition Plan: Discharge home once improved   Consultants:  Gastroenterology  Procedures: EGD 3/22: - Normal esophagus.  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  Antibiotics:  Levofloxacin 3/22>>3/24  HPI/Subjective: Feels okay today. Pt is having BM. Reports vomiting approx 15 minutes after trying to eat.  Objective: Filed Vitals:   06/14/15 2111 06/15/15 0431  BP: 155/91 147/88  Pulse: 73 67  Temp: 97.4 F (36.3 C) 98.4 F (36.9 C)  Resp: 18 17    Intake/Output Summary (Last 24 hours) at 06/15/15 0719 Last data filed at 06/14/15 2113  Gross per 24 hour  Intake 2941.25 ml  Output   1300 ml  Net 1641.25 ml   Filed Weights   06/08/15 1510  Weight: 46.72 kg (103 lb)    Exam:  General: NAD, looks comfortable Cardiovascular:  RRR, S1, S2  Respiratory: clear bilaterally, No wheezing, rales or rhonchi Abdomen: soft, non tender, no distention , bowel sounds normal Musculoskeletal: No edema b/l   Data Reviewed: Basic Metabolic Panel:  Recent Labs Lab 06/08/15 1830 06/09/15 0508 06/10/15 0510 06/12/15 0524  NA 139 142 143 142  K 3.5 3.2* 3.9 4.1  CL 104 108 108 104  CO2 27 25 29  32  GLUCOSE 90 84 82 90  BUN 12 11 <5* <5*  CREATININE 0.57 0.48 0.53 0.60  CALCIUM 9.1 8.5* 8.7* 8.9   Liver Function Tests:  Recent Labs Lab 06/08/15 1830 06/14/15 0643  AST 24 19  ALT 13* 11*  ALKPHOS 155* 118  BILITOT 0.5 0.5  PROT 6.7 5.9*  ALBUMIN 3.7 3.1*    Recent Labs Lab 06/08/15 1830  LIPASE 33   No results for input(s): AMMONIA in the last 168 hours. CBC:  Recent Labs Lab 06/08/15 1830 06/09/15 0508 06/10/15 0510  WBC 5.9 5.9 4.5  NEUTROABS 3.5  --   --   HGB 12.3 10.9* 10.0*  HCT 38.8 35.2* 32.5*  MCV 80.2 81.9 82.3  PLT 416* 346 332   Cardiac Enzymes: No results for input(s): CKTOTAL, CKMB, CKMBINDEX, TROPONINI in the last 168 hours. BNP (last 3 results) No results for input(s): BNP in the last 8760 hours.  ProBNP (last 3 results) No results for input(s): PROBNP in the last 8760 hours.  CBG: No results for input(s): GLUCAP in the last 168 hours.  Recent Results (from the past 240 hour(s))  Urine culture     Status: None   Collection Time: 06/08/15  4:54 PM  Result  Value Ref Range Status   Specimen Description URINE, CLEAN CATCH  Final   Special Requests NONE  Final   Culture   Final    >=100,000 COLONIES/mL ENTEROCOCCUS SPECIES Performed at [facility]    Report Status 06/11/2015 FINAL  Final   Organism ID, Bacteria ENTEROCOCCUS SPECIES  Final      Susceptibility   Enterococcus species - MIC*    AMPICILLIN <=2 SENSITIVE Sensitive     LEVOFLOXACIN 1 SENSITIVE Sensitive     NITROFURANTOIN <=16 SENSITIVE Sensitive     VANCOMYCIN 1 SENSITIVE Sensitive     *  >=100,000 COLONIES/mL ENTEROCOCCUS SPECIES     Studies: No results found.  Scheduled Meds: . ALPRAZolam  0.5 mg Oral BID  . enoxaparin (LOVENOX) injection  40 mg Subcutaneous Q24H  . metoCLOPramide  5 mg Oral TID AC & HS  . ondansetron (ZOFRAN) IV  4 mg Intravenous TID WC & HS  . pantoprazole (PROTONIX) IV  40 mg Intravenous Q12H  . PARoxetine  40 mg Oral q morning - 10a  . phenazopyridine  100 mg Oral TID WC  . zolpidem  5 mg Oral QHS   Continuous Infusions: . 0.9 % NaCl with KCl 20 mEq / L 75 mL/hr at 06/14/15 1903    Principal Problem:   Nausea and vomiting Active Problems:   Rheumatoid arthritis (HCC)   UTI (lower urinary tract infection)   Epigastric abdominal pain   Essential hypertension   Emesis   Dilated bile duct    Time spent: 25 mins    Kathie Dike, MD.  [facility] Pager [phone]. If 7PM-7AM, please contact night-coverage at [email or web address], password [facility] 06/15/2015, 7:19 AM  LOS: 6 days     By signing my name below, I, Delene Ruffini, attest that this documentation has been prepared under the direction and in the presence of Kathie Dike, MD. Electronically Signed: Delene Ruffini 06/15/2015 1:02pm  I, Dr. Kathie Dike, personally performed the services described in this documentaiton. All medical record entries made by the scribe were at my direction and in my presence. I have reviewed the chart and agree that the record reflects my personal performance and is accurate and complete  Kathie Dike, MD, 06/15/2015 1:09 PM

## 2015-06-15 NOTE — Progress Notes (Addendum)
Anxious and tearful this morning. States pain is persisting. States she is able to eat but throws up some time afterwards. Patient thinks nausea has improved somewhat, however. Unsure whether or not she is keeping the [place]. However, no apparent side effects.   Vital signs in last 24 hours: Temp:  [97.4 F (36.3 C)-98.4 F (36.9 C)] 98.4 F (36.9 C) (03/26 0431) Pulse Rate:  [67-73] 67 (03/26 0431) Resp:  [17-20] 17 (03/26 0431) BP: (118-155)/(78-91) 147/88 mmHg (03/26 0431) SpO2:  [96 %-97 %] 96 % (03/26 0431) Last BM Date: 06/13/15 General:   Anxious appearing Abdomen:  Nondistended. Positive bowel sounds. Soft and essentially non-tender with distraction. No mass. Extremities:  Without clubbing or edema.    Intake/Output from previous day: 03/25 0701 - 03/26 0700 In: 3301.3 [P.O.:840; I.V.:2461.3] Out: 1600 [Urine:1600] Intake/Output this shift:   Impression:  Intermittent nausea and vomiting. EGD, CT scan essentially negative.  LFTs normal.  Abdominal exam benign this morning. I do suspect a functional component at this time. She may or may not be absorbing Reglan adequately.  Recommendations:  In lieu of increasing oral Reglan to 10 mg, with go ahead and change her over to IV Reglan temporarily as suggested by Dr. Roderic Palau earlier today.  Continue scheduled Zofran and acid suppression therapy.  She will be reassessed in the morning.  I have discussed with Dr. Roderic Palau earlier.

## 2015-06-16 ENCOUNTER — Encounter (HOSPITAL_COMMUNITY): Payer: Self-pay | Admitting: Internal Medicine

## 2015-06-16 MED ORDER — BOOST / RESOURCE BREEZE PO LIQD
1.0000 | Freq: Two times a day (BID) | ORAL | Status: DC
  Administered 2015-06-16: 1 via ORAL

## 2015-06-16 NOTE — Progress Notes (Signed)
    Subjective: Pain worse this morning, located supraumbilically. Associated nausea and vomiting. Poor intake. States she is unable to tolerate solid diet and feels like she would do better with soft foods such as oatmeal and mashed potatoes. Agreeable to trying boost/ensure.   Objective: Vital signs in last 24 hours: Temp:  [97.8 F (36.6 C)-98.5 F (36.9 C)] 98.4 F (36.9 C) (03/27 0555) Pulse Rate:  [72-74] 73 (03/27 0555) Resp:  [16-20] 16 (03/27 0555) BP: (135-166)/(88-106) 139/88 mmHg (03/27 0555) SpO2:  [96 %-98 %] 98 % (03/27 0555) Last BM Date: 06/13/15 General:   Alert and oriented, tearful  Head:  Normocephalic and atraumatic. Abdomen:  Bowel sounds present, soft, TTP supraumbilically Msk:  Symmetrical without gross deformities. Normal posture. Pulses:  Normal pulses noted. Extremities:  Without edema. Neurologic:  Alert and  oriented x4 Psych:  Alert and cooperative.   Intake/Output from previous day: 03/26 0701 - 03/27 0700 In: 3347.5 [P.O.:840; I.V.:2507.5] Out: 2600 [Urine:2600] Intake/Output this shift:   LFT  Recent Labs  06/14/15 0643  PROT 5.9*  ALBUMIN 3.1*  AST 19  ALT 11*  ALKPHOS 118  BILITOT 0.5  BILIDIR 0.1  IBILI 0.4   Assessment: 60 year old female with complicated past medical history, with abdominal pain, nausea, and vomiting. EGD unrevealing. CT scan last on file from earlier this month without concerning signs. LFTs normal. She does report worsening abdominal pain and would like to decrease diet to soft foods. She is agreeable to adding boost/ensure for supplemental nutrition. Strongly suspect a functional component; however, she does have an altered anatomy due to prior surgery. Last CT showed no evidence of internal hernia. If pain worsens or persists, could consider repeat CT as she has not had one this admission.    Plan: Soft foods Continue supportive measures Consider repeat CT  Orvil Feil, ANP-BC [facility]  Gastroenterology    LOS: 7 days    06/16/2015, 7:52 AM

## 2015-06-16 NOTE — Progress Notes (Signed)
[facility] PROGRESS NOTE  Jordan Haney [id number] DOB: January 24, 1956 DOA: 06/08/2015 PCP: [name]  Assessment/Plan: 1. Nausea, vomiting and abdominal pain. Patient reports some improvement in her symptoms after starting reglan. Recent CT abdomen did not show any acute findings. EGD done 3/22 was unremarkable and did not show any signs of peptic ulcer disease. GI is following. MRCP may not be a viable option since patient reports she needs to be completely sedated to undergo MRI and would not be able to hold her breath on command. Other options include EUS. Continue scheduled zofran q6h. Continue PPI. Will continue with other treatments and monitor. Attempt to advance to soft diet. Consider repeat CT scan since pain is worse today. Will defer to GI.  2. Enterococcal UTI. Urine Cx showed enterococcus species. Treated for 3 days with levofloxacin.  3. Hypertension. Remains stable. Continue hydralazine PRN. 4. Anxiety. Continue Xanax  Code Status: Full code DVT prophylaxis: Lovenox Family Communication: Discussed with patient  Disposition Plan: Discharge home once improved   Consultants:  Gastroenterology  Procedures:  EGD 3/22: - Normal esophagus. 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  Antibiotics:  Levofloxacin 3/22>>3/24  HPI/Subjective: Pain worsening this morning. Reports vomiting x2 yesterday and x2 this morning.   Objective: Filed Vitals:   06/15/15 2134 06/16/15 0555  BP: 166/106 139/88  Pulse: 72 73  Temp: 98.5 F (36.9 C) 98.4 F (36.9 C)  Resp: 16 16    Intake/Output Summary (Last 24 hours) at 06/16/15 0812 Last data filed at 06/16/15 0555  Gross per 24 hour  Intake 3347.5 ml  Output   2600 ml  Net  747.5 ml   Filed Weights   06/08/15 1510  Weight: 46.72 kg (103 lb)    Exam: General: Appears mildly uncomfortable.                                              Cardiovascular: RRR, S1, S2   Respiratory: clear bilaterally, No wheezing, rales or rhonchi Abdomen: soft, non tender, no distention , bowel sounds normal Musculoskeletal: No edema b/l   Data Reviewed: Basic Metabolic Panel:  Recent Labs Lab 06/10/15 0510 06/12/15 0524  NA 143 142  K 3.9 4.1  CL 108 104  CO2 29 32  GLUCOSE 82 90  BUN <5* <5*  CREATININE 0.53 0.60  CALCIUM 8.7* 8.9   Liver Function Tests:  Recent Labs Lab 06/14/15 0643  AST 19  ALT 11*  ALKPHOS 118  BILITOT 0.5  PROT 5.9*  ALBUMIN 3.1*    Recent Labs Lab 06/10/15 0510  WBC 4.5  HGB 10.0*  HCT 32.5*  MCV 82.3  PLT 332    Recent Results (from the past 240 hour(s))  Urine culture     Status: None   Collection Time: 06/08/15  4:54 PM  Result Value Ref Range Status   Specimen Description URINE, CLEAN CATCH  Final   Special Requests NONE  Final   Culture   Final    >=100,000 COLONIES/mL ENTEROCOCCUS SPECIES Performed at [facility]    Report Status 06/11/2015 FINAL  Final   Organism ID, Bacteria ENTEROCOCCUS SPECIES  Final      Susceptibility   Enterococcus species - MIC*    AMPICILLIN <=2 SENSITIVE Sensitive     LEVOFLOXACIN 1 SENSITIVE Sensitive     NITROFURANTOIN <=16 SENSITIVE  Sensitive     VANCOMYCIN 1 SENSITIVE Sensitive     * >=100,000 COLONIES/mL ENTEROCOCCUS SPECIES     Studies: No results found.  Scheduled Meds: . ALPRAZolam  0.5 mg Oral BID  . enoxaparin (LOVENOX) injection  40 mg Subcutaneous Q24H  . metoCLOPramide (REGLAN) injection  5 mg Intravenous 4 times per day  . ondansetron (ZOFRAN) IV  4 mg Intravenous TID WC & HS  . pantoprazole (PROTONIX) IV  40 mg Intravenous Q12H  . PARoxetine  40 mg Oral q morning - 10a  . phenazopyridine  100 mg Oral TID WC  . zolpidem  5 mg Oral QHS   Continuous Infusions: . 0.9 % NaCl with KCl 20 mEq / L 75 mL/hr at 06/16/15 0015    Principal Problem:   Nausea and vomiting Active Problems:   Rheumatoid arthritis (HCC)   UTI (lower urinary tract  infection)   Epigastric abdominal pain   Essential hypertension   Emesis   Dilated bile duct    Time spent: 25 mins    Kathie Dike, MD.  [facility] Pager [phone]. If 7PM-7AM, please contact night-coverage at [email or web address], password [facility] 06/16/2015, 8:12 AM  LOS: 7 days    By signing my name below, I, Rennis Harding, attest that this documentation has been prepared under the direction and in the presence of Kathie Dike, MD. Electronically signed: Rennis Harding, Scribe. 06/16/2015 1:05pm   I, Dr. Kathie Dike, personally performed the services described in this documentaiton. All medical record entries made by the scribe were at my direction and in my presence. I have reviewed the chart and agree that the record reflects my personal performance and is accurate and complete  Kathie Dike, MD, 06/16/2015 1:23 PM

## 2015-06-17 MED ORDER — METOCLOPRAMIDE HCL 10 MG PO TABS: 10.0000 mg | ORAL_TABLET | Freq: Three times a day (TID) | ORAL | Status: DC

## 2015-06-17 MED ORDER — BOOST / RESOURCE BREEZE PO LIQD: 1.0000 | Freq: Two times a day (BID) | ORAL | Status: DC

## 2015-06-17 MED ORDER — PANTOPRAZOLE SODIUM 40 MG PO TBEC: 40.0000 mg | DELAYED_RELEASE_TABLET | Freq: Two times a day (BID) | ORAL | Status: DC

## 2015-06-17 MED ORDER — ONDANSETRON 4 MG PO TBDP: 4.0000 mg | ORAL_TABLET | Freq: Four times a day (QID) | ORAL | Status: DC | PRN

## 2015-06-17 MED ORDER — OXYCODONE HCL 5 MG PO TABS: 5.0000 mg | ORAL_TABLET | ORAL | Status: DC | PRN

## 2015-06-17 NOTE — Discharge Summary (Signed)
Physician Discharge Summary  Jordan Haney [id number] DOB: Jun 27, 1955 DOA: 06/08/2015  PCP: [name]  Admit date: 06/08/2015 Discharge date: 06/17/2015  Time spent: 35 minutes  Recommendations for Outpatient Follow-up:  1. Follow up with PCP in 1-2 weeks  2. Follow up with outpatient GI for repeat CT only if experience recurrence of acute on chronic abdominal pain or recurrent refractory N/V.   Discharge Diagnoses:  Principal Problem:   Nausea and vomiting Active Problems:   Rheumatoid arthritis (HCC)   UTI (lower urinary tract infection)   Epigastric abdominal pain   Essential hypertension   Emesis   Dilated bile duct   Discharge Condition: Improved   Diet recommendation: Heart healthy   Filed Weights   06/08/15 1510  Weight: 46.72 kg (103 lb)    History of present illness:  60 yof with PMH of Rheumatoid Arthritis, Chronic ABD Pain,PUD, and HTN presented with complaints of severe abdominal pain with nausea and vomiting. She was hospitalized from 03/7- 03/13 for similar symptoms and had an US of the ABD, and A CTscan of the ABD without acute findings.Admitted for GI consultation and further treatment.   Hospital Course:  Patient was admitted for recurrence of nausea, vomiting and abdominal pain. Patient reported some improvement in her symptoms after starting reglan, zofran and PPI. Recent CT abdomen did not show any acute findings. EGD done 3/22 was unremarkable and did not show any signs of peptic ulcer disease. MRCP was not considered a viable option since patient reported she needs to be completely sedated to undergo MRI and would not be able to hold her breath on command. Other options included EUS. Pt was continued on scheduled Zofran and Reglan. Diet was also changed to soft foods. She appeared to tolerate this well and has not had further vomiting. Plan will be to discharge to with outatient  GI follow up. Consider outpatient CT scan if pain recurs.  She may also benefit from tertiary care evaluation.  1. Enterococcal UTI. Urine Cx showed enterococcus species. Treated for 3 days with levofloxacin.  2. Hypertension. Remained stable. Restart Lisinopril on discharge.  3. Anxiety. Continue Xanax  Procedures:  EGD 3/22: - Normal esophagus. 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  Consultations:  Gastroenterology  Discharge Exam: Filed Vitals:   06/16/15 2100 06/17/15 0500  BP: 135/84 142/83  Pulse: 67 68  Temp: 97.6 F (36.4 C) 97.4 F (36.3 C)  Resp: 18 16   General: NAD, looks comfortable Cardiovascular: RRR, S1, S2  Respiratory: clear bilaterally, No wheezing, rales or rhonchi Abdomen: soft, non tender, no distention , bowel sounds normal Musculoskeletal: No edema b/l  Discharge Instructions   Discharge Instructions    Diet - low sodium heart healthy    Complete by:  As directed      Increase activity slowly    Complete by:  As directed           Current Discharge Medication List    START taking these medications   Details  feeding supplement (BOOST / RESOURCE BREEZE) LIQD Take 1 Container by mouth 2 (two) times daily between meals. Qty: 60 Container, Refills: 0    oxyCODONE (OXY IR/ROXICODONE) 5 MG immediate release tablet Take 1-2 tablets (5-10 mg total) by mouth every 4 (four) hours as needed for moderate pain. Qty: 30 tablet, Refills: 0      CONTINUE these medications which have CHANGED   Details  metoCLOPramide (REGLAN) 10 MG tablet Take 1 tablet (  10 mg total) by mouth 3 (three) times daily before meals. Qty: 90 tablet, Refills: 1    ondansetron (ZOFRAN ODT) 4 MG disintegrating tablet Take 1 tablet (4 mg total) by mouth every 6 (six) hours as needed for nausea or vomiting. Qty: 30 tablet, Refills: 1      CONTINUE these medications which have NOT CHANGED   Details  ALPRAZolam (XANAX) 0.5 MG tablet Take 0.5 mg by mouth 2 (two) times daily.    calcium  gluconate 500 MG tablet Take 1 tablet by mouth daily.    lisinopril (PRINIVIL,ZESTRIL) 20 MG tablet Take 20 mg by mouth daily.    pantoprazole (PROTONIX) 40 MG tablet Take 1 tablet (40 mg total) by mouth 2 (two) times daily. Qty: 60 tablet, Refills: 1    PARoxetine (PAXIL) 40 MG tablet Take 40 mg by mouth every morning.    polyethylene glycol (MIRALAX / GLYCOLAX) packet Take 17 g by mouth 2 (two) times daily. Qty: 30 each, Refills: 0    senna (SENOKOT) 8.6 MG TABS tablet Take 2 tablets (17.2 mg total) by mouth 2 (two) times daily. Qty: 120 each, Refills: 0    zolpidem (AMBIEN) 5 MG tablet Take 5 mg by mouth at bedtime.     Vitamin D, Ergocalciferol, (DRISDOL) 50000 units CAPS capsule Take 50,000 Units by mouth 2 (two) times a week.      STOP taking these medications     aspirin EC 325 MG tablet      HYDROcodone-acetaminophen (NORCO/VICODIN) 5-325 MG tablet        Allergies  Allergen Reactions  . Compazine Anaphylaxis  . Phenergan [Promethazine] Anaphylaxis  . Vistaril [Hydroxyzine Hcl] Other (See Comments)    Reaction: legs shake  . Benadryl [Diphenhydramine Hcl] Other (See Comments)    Pt states, "it makes my legs shake really bad"   . Gabapentin Other (See Comments)    Reactions: legs shake  . Latex Swelling      The results of significant diagnostics from this hospitalization (including imaging, microbiology, ancillary and laboratory) are listed below for reference.    Significant Diagnostic Studies: Ct Abdomen Pelvis W Contrast  05/28/2015  CLINICAL DATA:  Abdominal pain. Nausea with vomiting. Prior cholecystectomy. EXAM: CT ABDOMEN AND PELVIS WITH CONTRAST TECHNIQUE: Multidetector CT imaging of the abdomen and pelvis was performed using the standard protocol following bolus administration of intravenous contrast. CONTRAST:  148mL OMNIPAQUE IOHEXOL 300 MG/ML  SOLN COMPARISON:  08/18/2012 FINDINGS: Lower chest:  No acute findings. Hepatobiliary: Severe hepatic  steatosis again demonstrated. No liver masses identified. Prior cholecystectomy noted. No evidence of biliary dilatation. Pancreas: No mass, inflammatory changes, or other significant abnormality. Spleen: Within normal limits in size and appearance. Adrenals/Urinary Tract: No masses identified. No evidence of hydronephrosis. Small left renal cyst again noted. Distended urinary bladder. Stomach/Bowel: No evidence of obstruction, inflammatory process, or abnormal fluid collections. Large colonic stool burden again noted. Vascular/Lymphatic: No pathologically enlarged lymph nodes. No evidence of abdominal aortic aneurysm. Reproductive: Prior hysterectomy noted. Adnexal regions are unremarkable in appearance. Other: Left hip prosthesis results in beam hardening artifact through the inferior aspect of the pelvis. Musculoskeletal: No suspicious bone lesions identified. 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 IMPRESSION: Distended urinary bladder ; recommend clinical correlation for urinary retention. No evidence hydronephrosis or other acute findings. Stable severe hepatic steatosis. Large stool burden noted; suggest clinical correlation for possible constipation. Electronically Signed   By: Jenny Reichmann  Kris Hartmann M.D.   On: 05/28/2015 21:39   Dg Ugi W/small Bowel High Density  06/10/2015  CLINICAL DATA:  60 year old female with epigastric pain common nausea and vomiting for the past 3 days. 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 Additional history of small-bowel obstruction status post partial small bowel resection 2 years ago. EXAM: UPPER GI SERIES WITH SMALL BOWEL FOLLOW-THROUGH FLUOROSCOPY TIME:  If the device does not provide the exposure index: Fluoroscopy Time (in minutes and seconds):  2 minutes and 54 seconds Number of Acquired Images:  28 TECHNIQUE: Combined double contrast and single contrast upper GI  series using effervescent crystals, thick barium, and thin barium. Subsequently, serial images of the small bowel were obtained including spot views of the terminal ileum. COMPARISON:  No priors. FINDINGS: Preprocedure KUB demonstrated gas and stool throughout the colon extending to the distal rectum. No pathologic dilatation of small bowel. Suture line in the left side of the abdomen. Numerous surgical clips in the right upper quadrant from prior cholecystectomy. Surgical clip projecting over the right side of the pelvis. Postprocedural changes of vertebroplasty at L3 and L5. Double contrast images of the esophagus demonstrated a normal appearance of the esophageal mucosa. No esophageal mass, stricture or esophageal ring. No hiatal hernia. Esophageal motility was normal. Double contrast images of the stomach are limited secondary to lack of patient mobility, and inability to adequately coat the entire stomach. Postoperative changes of prior Roux-en-Y gastrectomy are noted. Throughout the remaining portions of the stomach there were several areas of subtle nodularity, that could represent small gastric polyps (although some of these findings may be attributable to small gas bubbles in poorly mixed and poorly distributed barium). No definite gastric mass or ulcer was identified. Remaining portions of small bowel were normal in appearance, without focal mucosal abnormality, definite stricture or definite mass. No pathologic dilatation of small bowel. Terminal ileum was normal in appearance. IMPRESSION: 1. No evidence of recurrent bowel obstruction. 2. No acute findings to account for the patient's symptoms. 3. Multiple minor mucosal abnormalities in the stomach that may suggest the presence of multiple small hyperplastic polyps. 4. Normal appearance of the esophagus. Electronically Signed   By: Vinnie Langton M.D.   On: 06/10/2015 14:16   US Abdomen Limited Ruq  05/28/2015  CLINICAL DATA:  Nausea and vomiting with  abdominal pain for 3 days. History of prior cholecystectomy. EXAM: US ABDOMEN LIMITED - RIGHT UPPER QUADRANT COMPARISON:  08/18/2012 abdominal CT FINDINGS: Gallbladder: Cholecystectomy. Common bile duct: Diameter: Mildly dilated after cholecystectomy. Example 13 mm in the porta hepatis, tapering to 11 mm in the pancreatic head. New since 08/18/2012. Liver: Moderate hepatic steatosis.  No intrahepatic duct dilatation. IMPRESSION: 1. Cholecystectomy with mild common duct dilatation, maximally 13 mm. Consider correlation with bilirubin levels. If these are elevated, consider MRCP. 2. Moderate hepatic steatosis. Electronically Signed   By: Abigail Miyamoto M.D.   On: 05/28/2015 08:48    Microbiology: Recent Results (from the past 240 hour(s))  Urine culture     Status: None   Collection Time: 06/08/15  4:54 PM  Result Value Ref Range Status   Specimen Description URINE, CLEAN CATCH  Final   Special Requests NONE  Final   Culture   Final    >=100,000 COLONIES/mL ENTEROCOCCUS SPECIES Performed at [facility]    Report Status 06/11/2015 FINAL  Final   Organism ID, Bacteria ENTEROCOCCUS SPECIES  Final      Susceptibility   Enterococcus species -  MIC*    AMPICILLIN <=2 SENSITIVE Sensitive     LEVOFLOXACIN 1 SENSITIVE Sensitive     NITROFURANTOIN <=16 SENSITIVE Sensitive     VANCOMYCIN 1 SENSITIVE Sensitive     * >=100,000 COLONIES/mL ENTEROCOCCUS SPECIES     Labs: Basic Metabolic Panel:  Recent Labs Lab 06/12/15 0524  NA 142  K 4.1  CL 104  CO2 32  GLUCOSE 90  BUN <5*  CREATININE 0.60  CALCIUM 8.9   Liver Function Tests:  Recent Labs Lab 06/14/15 0643  AST 19  ALT 11*  ALKPHOS 118  BILITOT 0.5  PROT 5.9*  ALBUMIN 3.1*    Signed:  Kathie Dike, MD  [facility] 06/17/2015, 12:46 PM    By signing my name below, I, Rennis Harding, attest that this documentation has been prepared under the direction and in the presence of Kathie Dike,  MD. Electronically signed: Rennis Harding, Scribe. 06/17/2015   I, Dr. Kathie Dike, personally performed the services described in this documentaiton. All medical record entries made by the scribe were at my direction and in my presence. I have reviewed the chart and agree that the record reflects my personal performance and is accurate and complete  Kathie Dike, MD, 06/17/2015 12:46 PM

## 2015-06-17 NOTE — Progress Notes (Signed)
REVIEWED-NO ADDITIONAL RECOMMENDATIONS..   Subjective: Pain the same, nausea and vomiting much improved. Tolerated breakfast. Likes the boost breeze. Ate some eggs, oatmeal, orange juice.   Objective: Vital signs in last 24 hours: Temp:  [97.4 F (36.3 C)-98.2 F (36.8 C)] 97.4 F (36.3 C) (03/28 0500) Pulse Rate:  [65-70] 68 (03/28 0500) Resp:  [16-18] 16 (03/28 0500) BP: (134-155)/(83-101) 142/83 mmHg (03/28 0500) SpO2:  [93 %-98 %] 96 % (03/28 0500) Last BM Date: 06/14/15 General:   Alert and oriented, pleasant, appears calmer than previous visits Abdomen:  Bowel sounds present, soft, TTP upper abdomen but minimized by distraction  Pulses:  Normal pulses noted. Extremities:  Without edema. Neurologic:  Alert and  oriented x4;  grossly normal neurologically. Psych:  Alert and cooperative. Normal mood and affect.  Intake/Output from previous day: 03/27 0701 - 03/28 0700 In: 1907.5 [P.O.:480; I.V.:1427.5] Out: -  Intake/Output this shift:   Assessment: 60 year old female with complicated past medical history, with abdominal pain, nausea, and vomiting. EGD unrevealing. CT scan last on file from earlier this month without concerning signs, no evidence of internal hernia. LFTs normal. Strongly suspect a functional component; however, she does have an altered anatomy due to prior surgery. Today, she is markedly improved from when I saw her yesterday morning, tolerating her diet, and actually appears calmer. Pain persists but is not worsened since admission. N/V have resolved.    Plan: Continue soft diet and boost breeze, as she does well with this Supportive measures with hopeful discharge in next 24 hours Repeat CT only if acute on chronic abdominal pain or recurrent refractory N/V  Orvil Feil, ANP-BC [facility] Gastroenterology    LOS: 8 days    06/17/2015, 8:56 AM

## 2015-06-19 NOTE — Progress Notes (Signed)
Patient discharged with instructions given on medications,and follow up visits,patient verbalized understanding. Accompanied by staff to an awaiting vehicle.. 

## 2015-07-02 ENCOUNTER — Inpatient Hospital Stay (HOSPITAL_COMMUNITY)
Admission: EM | Admit: 2015-07-02 | Discharge: 2015-07-05 | DRG: 392 | Disposition: A | Discharge status: Home / Self Care | Attending: Internal Medicine | Admitting: Internal Medicine

## 2015-07-02 ENCOUNTER — Encounter (HOSPITAL_COMMUNITY): Payer: Self-pay | Admitting: Emergency Medicine

## 2015-07-02 DIAGNOSIS — K279 Peptic ulcer, site unspecified, unspecified as acute or chronic, without hemorrhage or perforation: Secondary | ICD-10-CM | POA: Diagnosis present

## 2015-07-02 DIAGNOSIS — R112 Nausea with vomiting, unspecified: Secondary | ICD-10-CM | POA: Diagnosis not present

## 2015-07-02 DIAGNOSIS — Z96642 Presence of left artificial hip joint: Secondary | ICD-10-CM | POA: Diagnosis not present

## 2015-07-02 DIAGNOSIS — Z87442 Personal history of urinary calculi: Secondary | ICD-10-CM | POA: Diagnosis not present

## 2015-07-02 DIAGNOSIS — Z8711 Personal history of peptic ulcer disease: Secondary | ICD-10-CM

## 2015-07-02 DIAGNOSIS — E876 Hypokalemia: Secondary | ICD-10-CM | POA: Diagnosis present

## 2015-07-02 DIAGNOSIS — E440 Moderate protein-calorie malnutrition: Secondary | ICD-10-CM | POA: Diagnosis present

## 2015-07-02 DIAGNOSIS — Z8249 Family history of ischemic heart disease and other diseases of the circulatory system: Secondary | ICD-10-CM | POA: Diagnosis not present

## 2015-07-02 DIAGNOSIS — R1013 Epigastric pain: Principal | ICD-10-CM | POA: Diagnosis present

## 2015-07-02 DIAGNOSIS — D649 Anemia, unspecified: Secondary | ICD-10-CM | POA: Diagnosis not present

## 2015-07-02 DIAGNOSIS — K760 Fatty (change of) liver, not elsewhere classified: Secondary | ICD-10-CM | POA: Diagnosis present

## 2015-07-02 DIAGNOSIS — F329 Major depressive disorder, single episode, unspecified: Secondary | ICD-10-CM | POA: Diagnosis present

## 2015-07-02 DIAGNOSIS — M069 Rheumatoid arthritis, unspecified: Secondary | ICD-10-CM | POA: Diagnosis not present

## 2015-07-02 DIAGNOSIS — I10 Essential (primary) hypertension: Secondary | ICD-10-CM | POA: Diagnosis not present

## 2015-07-02 DIAGNOSIS — F419 Anxiety disorder, unspecified: Secondary | ICD-10-CM | POA: Diagnosis present

## 2015-07-02 DIAGNOSIS — R109 Unspecified abdominal pain: Secondary | ICD-10-CM

## 2015-07-02 DIAGNOSIS — Z681 Body mass index (BMI) 19 or less, adult: Secondary | ICD-10-CM | POA: Diagnosis not present

## 2015-07-02 DIAGNOSIS — F40240 Claustrophobia: Secondary | ICD-10-CM | POA: Diagnosis not present

## 2015-07-02 DIAGNOSIS — Z9049 Acquired absence of other specified parts of digestive tract: Secondary | ICD-10-CM

## 2015-07-02 LAB — COMPREHENSIVE METABOLIC PANEL
ALK PHOS: 120 U/L (ref 38–126)
ALT: 15 U/L (ref 14–54)
ANION GAP: 9 (ref 5–15)
AST: 25 U/L (ref 15–41)
Albumin: 3.8 g/dL (ref 3.5–5.0)
BUN: 10 mg/dL (ref 6–20)
CALCIUM: 8.9 mg/dL (ref 8.9–10.3)
CO2: 24 mmol/L (ref 22–32)
CREATININE: 0.7 mg/dL (ref 0.44–1.00)
Chloride: 107 mmol/L (ref 101–111)
Glucose, Bld: 103 mg/dL — ABNORMAL HIGH (ref 65–99)
Potassium: 3.4 mmol/L — ABNORMAL LOW (ref 3.5–5.1)
Sodium: 140 mmol/L (ref 135–145)
Total Bilirubin: 0.4 mg/dL (ref 0.3–1.2)
Total Protein: 6.8 g/dL (ref 6.5–8.1)

## 2015-07-02 LAB — CBC
HCT: 32.9 % — ABNORMAL LOW (ref 36.0–46.0)
HEMOGLOBIN: 10.7 g/dL — AB (ref 12.0–15.0)
MCH: 26 pg (ref 26.0–34.0)
MCHC: 32.5 g/dL (ref 30.0–36.0)
MCV: 79.9 fL (ref 78.0–100.0)
Platelets: 374 10*3/uL (ref 150–400)
RBC: 4.12 MIL/uL (ref 3.87–5.11)
RDW: 15.2 % (ref 11.5–15.5)
WBC: 5 10*3/uL (ref 4.0–10.5)

## 2015-07-02 LAB — LIPASE, BLOOD: LIPASE: 27 U/L (ref 11–51)

## 2015-07-02 NOTE — ED Notes (Signed)
Pt threw herself into the floor per other patients. Pt was placed in wheelchair and vitals rechecked. Charge nurse notified.

## 2015-07-02 NOTE — ED Notes (Signed)
Pt states she is having a pancreatic attack and her doctor told her to come to the ed.

## 2015-07-02 NOTE — ED Provider Notes (Signed)
CSN: [id number]     Arrival date & time 07/02/15  2210 History  By signing my name below, I, Doran Stabler, attest that this documentation has been prepared under the direction and in the presence of Delora Fuel, MD. Electronically Signed: Doran Stabler, ED Scribe. 07/03/2015. 12:51 AM.   Chief Complaint  Patient presents with  . Abdominal Pain    The history is provided by the patient. No language interpreter was used.   HPI Comments: Jordan Haney is a 60 y.o. female who presents to the Emergency Department with a PMHx of rheumatoid arthritis, chronic abd pain, pancreatitis, and HTN complaining of epigastric abdominal pain radiating to her back for the past 2 days. Pt also reports nausea, vomiting and diarrhea. She rates her pain a 10/10 in severity. Pt denies any aggravating of alleviating factors. Pt took Zofran, Reglan, and oxycodone with no relief.  Pt denies any fevers, chills, cough, SOB, CP, diaphoresis, or any other symptoms a this time.   Past Medical History  Diagnosis Date  . RA (rheumatoid arthritis) ([place])  2008  . Constipation 03/21/2011  . Hypokalemia 03/21/2011  . Fatty liver 03/21/2011  . Pancreatitis     states in remote past, very severe, ?biliary pancreatitis   . T12 compression fracture ([place]) 2011  . Duodenal ulcer     remote per patient. +BC powders, patient report negative EGD six months ago  . Dilated pancreatic duct ([place])     ?chronic pancreatitis   . Kidney stones   . Chronic abdominal pain   . Pneumonia     as child  . Anemia   . Lumbar stress fracture   . Anxiety   . Essential hypertension 06/09/2015   Past Surgical History  Procedure Laterality Date  . Cholecystectomy    . Knee surgery    . Appendectomy    . Complete hysterectomy    . Ventral wall hernia    . Esophagogastroduodenoscopy  08/2010    Dr. Earley Brooke, [place]. small hh, mild prepylori gastritis. path unavailable  . Esophagogastroduodenoscopy  04/2010    Dr. Earley Brooke, [place]. small hh,  mild distal esophagitis, antral gastritis and duodenitis. Bx mild chronic gastritis and no H.pylori  . Esophagogastroduodenoscopy  08/2009    Dr. Posey Pronto, [place]. Moderate gastritis, moderate duodenitis with nodularity in proximal duodenal bulb, bx chronic gastritis, no helicobacter, mild chronic duodenitis  . Esophagogastroduodenoscopy  02/2007    Dr. Earley Brooke, [place]. antral gastritis and duodenal ulcers. Bx mild chronic gastritis. No bx from duodenal ulcer available.  [name] dilation  04/06/2011    Procedure: SAVORY DILATION;  Surgeon: Dorothyann Peng, MD;  Location: [facility];  Service: Endoscopy;  Laterality: N/A;  16 mm  . Esophagogastroduodenoscopy  04/06/11    Dr. Oneida Alar: 1-2 cm hiatal hernia, mod gastritis, 31mmX3mm linear ulcer in duodenal bulb, stricture 1st part of duodenum, dilated to 12 mm  . Esophagogastroduodenoscopy  05/12/11    partial gastric oulet obstruction secondary to stricture between bulb/2nd portion of duodenum with marked friability and inflammation but no discrete ulcer, dilated stomach. s/p dilation but high risk for restenosis  . Esophagogastroduodenoscopy  06/22/2011    [id number] ring/Moderate gastritis/PERSISTENT Ulcer in the bulb/descending duodenum Stricture in the bulb/descending duodenum  . Gastrojejunostomy  06/28/2011    Roux-en-Y with choledochoduodenostomy   . Abdominal hysterectomy    . Kidney stone surgery    . Esophagogastroduodenoscopy Left 06/25/2012    RMR: Normal esophagus. Surgically altered anastomosis to  small intestine noted. Couple of  at the anastomosis.  Patent efferent limb Patient had an oozing 1 cm anastomotic ulcer  . Back surgery  2013  . Colonoscopy N/A 08/16/2012    Dr. Gala Romney: normal but inadequate prep. Consider early interval colonoscopy.   . Cardiac catheterization      2008  . Esophagogastroduodenoscopy Left 10/23/2012    Dr. Gala Romney: mild erosive reflux esophagitis, s/p hemigastrectomy, prior site of gastric ulcer healed   . Total hip  arthroplasty Left 01/2015    in [place], [place]  . Total hip arthroplasty    . Kyphoplasty  Jan 2017    [place], [place]   . Exploratory laparotomy w/ bowel resection      2014, sometime in the summer   . Esophagogastroduodenoscopy N/A 06/11/2015    Procedure: ESOPHAGOGASTRODUODENOSCOPY (EGD);  Surgeon: Daneil Dolin, MD;  Location: [facility];  Service: Endoscopy;  Laterality: N/A;   Family History  Problem Relation Age of Onset  . Colon cancer Neg Hx   . Liver cancer Neg Hx   . Breast cancer Neg Hx   . Inflammatory bowel disease Neg Hx   . Heart attack Mother    Social History  Substance Use Topics  . Smoking status: Never Smoker   . Smokeless tobacco: Never Used  . Alcohol Use: No   OB History    No data available     Review of Systems  Constitutional: Negative for fever, chills and diaphoresis.  Respiratory: Negative for cough and shortness of breath.   Cardiovascular: Negative for chest pain.  Gastrointestinal: Positive for nausea, vomiting, abdominal pain and diarrhea.  All other systems reviewed and are negative.  Allergies  Compazine; Phenergan; Vistaril; Benadryl; Gabapentin; and Latex  Home Medications   Prior to Admission medications   Medication Sig Start Date End Date Taking? Authorizing Provider  ALPRAZolam Duanne Moron) 0.5 MG tablet Take 0.5 mg by mouth 2 (two) times daily.    Historical Provider, MD  calcium gluconate 500 MG tablet Take 1 tablet by mouth daily.    Historical Provider, MD  feeding supplement (BOOST / RESOURCE BREEZE) LIQD Take 1 Container by mouth 2 (two) times daily between meals. 06/17/15   Kathie Dike, MD  lisinopril (PRINIVIL,ZESTRIL) 20 MG tablet Take 20 mg by mouth daily.    Historical Provider, MD  metoCLOPramide (REGLAN) 10 MG tablet Take 1 tablet (10 mg total) by mouth 3 (three) times daily before meals. 06/17/15   Kathie Dike, MD  ondansetron (ZOFRAN ODT) 4 MG disintegrating tablet Take 1 tablet (4 mg total) by mouth every 6 (six)  hours as needed for nausea or vomiting. 06/17/15   Kathie Dike, MD  oxyCODONE (OXY IR/ROXICODONE) 5 MG immediate release tablet Take 1-2 tablets (5-10 mg total) by mouth every 4 (four) hours as needed for moderate pain. 06/17/15   Kathie Dike, MD  pantoprazole (PROTONIX) 40 MG tablet Take 1 tablet (40 mg total) by mouth 2 (two) times daily. 06/02/15   Kathie Dike, MD  PARoxetine (PAXIL) 40 MG tablet Take 40 mg by mouth every morning.    Historical Provider, MD  polyethylene glycol (MIRALAX / GLYCOLAX) packet Take 17 g by mouth 2 (two) times daily. 06/02/15   Kathie Dike, MD  senna (SENOKOT) 8.6 MG TABS tablet Take 2 tablets (17.2 mg total) by mouth 2 (two) times daily. 06/02/15   Kathie Dike, MD  Vitamin D, Ergocalciferol, (DRISDOL) 50000 units CAPS capsule Take 50,000 Units by mouth 2 (two) times a week.    Historical Provider, MD  zolpidem (AMBIEN) 5 MG tablet Take 5 mg by mouth at bedtime.     Historical Provider, MD   BP 141/104 mmHg  Pulse 92  Temp(Src) 99 F (37.2 C) (Temporal)  Resp 18  Ht 5\' 3"  (1.6 m)  Wt 110 lb (49.896 kg)  BMI 19.49 kg/m2  SpO2 98%   Physical Exam  Constitutional: She is oriented to person, place, and time. She appears well-developed and well-nourished.  Appears uncomfortable  HENT:  Head: Normocephalic and atraumatic.  Eyes: EOM are normal. Pupils are equal, round, and reactive to light.  Neck: Normal range of motion. Neck supple. No JVD present.  Cardiovascular: Normal rate, regular rhythm and normal heart sounds.   No murmur heard. Pulmonary/Chest: Effort normal and breath sounds normal. She has no wheezes. She has no rales. She exhibits no tenderness.  Abdominal: Soft. She exhibits no distension and no mass. Bowel sounds are decreased. There is no rebound and no guarding.  Moderate epigastric tenderness  Musculoskeletal: Normal range of motion. She exhibits no edema.  Lymphadenopathy:    She has no cervical adenopathy.  Neurological: She is  alert and oriented to person, place, and time. No cranial nerve deficit. She exhibits normal muscle tone. Coordination normal.  Skin: Skin is warm and dry. No rash noted.  Psychiatric: She has a normal mood and affect.  Nursing note and vitals reviewed.   ED Course  Procedures   DIAGNOSTIC STUDIES: Oxygen Saturation is 98% on room air, normal by my interpretation.    COORDINATION OF CARE: 12:43 AM Will give fluids, pain medication, antiemetic, Reglan and Protonix. Discussed treatment plan with pt at bedside and pt agreed to plan.   Labs Review Results for orders placed or performed during the hospital encounter of 07/02/15  Lipase, blood  Result Value Ref Range   Lipase 27 11 - 51 U/L  Comprehensive metabolic panel  Result Value Ref Range   Sodium 140 135 - 145 mmol/L   Potassium 3.4 (L) 3.5 - 5.1 mmol/L   Chloride 107 101 - 111 mmol/L   CO2 24 22 - 32 mmol/L   Glucose, Bld 103 (H) 65 - 99 mg/dL   BUN 10 6 - 20 mg/dL   Creatinine, Ser 0.70 0.44 - 1.00 mg/dL   Calcium 8.9 8.9 - 10.3 mg/dL   Total Protein 6.8 6.5 - 8.1 g/dL   Albumin 3.8 3.5 - 5.0 g/dL   AST 25 15 - 41 U/L   ALT 15 14 - 54 U/L   Alkaline Phosphatase 120 38 - 126 U/L   Total Bilirubin 0.4 0.3 - 1.2 mg/dL   GFR calc non Af Amer >60 >60 mL/min   GFR calc Af Amer >60 >60 mL/min   Anion gap 9 5 - 15  CBC  Result Value Ref Range   WBC 5.0 4.0 - 10.5 K/uL   RBC 4.12 3.87 - 5.11 MIL/uL   Hemoglobin 10.7 (L) 12.0 - 15.0 g/dL   HCT 32.9 (L) 36.0 - 46.0 %   MCV 79.9 78.0 - 100.0 fL   MCH 26.0 26.0 - 34.0 pg   MCHC 32.5 30.0 - 36.0 g/dL   RDW 15.2 11.5 - 15.5 %   Platelets 374 150 - 400 K/uL  Differential  Result Value Ref Range   Neutrophils Relative % 46 %   Neutro Abs 2.0 1.7 - 7.7 K/uL   Lymphocytes Relative 37 %   Lymphs Abs 1.6 0.7 - 4.0 K/uL   Monocytes Relative 11 %  Monocytes Absolute 0.5 0.1 - 1.0 K/uL   Eosinophils Relative 4 %   Eosinophils Absolute 0.2 0.0 - 0.7 K/uL   Basophils Relative 2  %   Basophils Absolute 0.1 0.0 - 0.1 K/uL  Lactic acid, plasma  Result Value Ref Range   Lactic Acid, Venous 0.7 0.5 - 2.0 mmol/L   I have personally reviewed and evaluated these images and lab results as part of my medical decision-making.   MDM   Final diagnoses:  Intractable abdominal pain  Non-intractable vomiting with nausea, unspecified vomiting type  Normochromic normocytic anemia    Abdominal pain with nausea and vomiting. Old records are reviewed and she has been admitted for abdominal pain and vomiting several times over the last month. She was also admitted at this hospital. Extensive workup has been unremarkable including CT scans and EGD. Note from [facility] suggested attempted celiac axis block which was to be arranged as an outpatient. Her exam today seems consistent with her prior exams. She has had multiple CT scans and I do not see an indication for repeat CT scan. She was given IV fluids as well as ondansetron and metoclopramide for nausea and hydromorphone for pain. Nausea did seem to improve but she did require additional doses of hydromorphone and did not get adequate relief of pain despite 3 doses. WBC is normal with normal differential and lactic acid level is normal. She has mild anemia which is stable. Because of inability to get adequate pain control, she will need to be admitted. Cases been discussed with Dr. Myna Hidalgo of [facility] who agrees to admit the patient under observation status.  I personally performed the services described in this documentation, which was scribed in my presence. The recorded information has been reviewed and is accurate.      Delora Fuel, MD [phone]

## 2015-07-03 ENCOUNTER — Encounter (HOSPITAL_COMMUNITY): Payer: Self-pay | Admitting: Family Medicine

## 2015-07-03 ENCOUNTER — Observation Stay (HOSPITAL_COMMUNITY)

## 2015-07-03 DIAGNOSIS — R112 Nausea with vomiting, unspecified: Secondary | ICD-10-CM | POA: Diagnosis present

## 2015-07-03 DIAGNOSIS — E440 Moderate protein-calorie malnutrition: Secondary | ICD-10-CM | POA: Insufficient documentation

## 2015-07-03 DIAGNOSIS — E876 Hypokalemia: Secondary | ICD-10-CM | POA: Diagnosis present

## 2015-07-03 DIAGNOSIS — R109 Unspecified abdominal pain: Secondary | ICD-10-CM | POA: Diagnosis not present

## 2015-07-03 DIAGNOSIS — Z8711 Personal history of peptic ulcer disease: Secondary | ICD-10-CM | POA: Diagnosis not present

## 2015-07-03 DIAGNOSIS — I10 Essential (primary) hypertension: Secondary | ICD-10-CM | POA: Diagnosis present

## 2015-07-03 DIAGNOSIS — Z87442 Personal history of urinary calculi: Secondary | ICD-10-CM | POA: Diagnosis not present

## 2015-07-03 DIAGNOSIS — K279 Peptic ulcer, site unspecified, unspecified as acute or chronic, without hemorrhage or perforation: Secondary | ICD-10-CM

## 2015-07-03 DIAGNOSIS — Z96642 Presence of left artificial hip joint: Secondary | ICD-10-CM | POA: Diagnosis present

## 2015-07-03 DIAGNOSIS — F329 Major depressive disorder, single episode, unspecified: Secondary | ICD-10-CM | POA: Diagnosis present

## 2015-07-03 DIAGNOSIS — M069 Rheumatoid arthritis, unspecified: Secondary | ICD-10-CM | POA: Diagnosis present

## 2015-07-03 DIAGNOSIS — D649 Anemia, unspecified: Secondary | ICD-10-CM

## 2015-07-03 DIAGNOSIS — Z9049 Acquired absence of other specified parts of digestive tract: Secondary | ICD-10-CM | POA: Diagnosis not present

## 2015-07-03 DIAGNOSIS — F40240 Claustrophobia: Secondary | ICD-10-CM | POA: Diagnosis present

## 2015-07-03 DIAGNOSIS — R1013 Epigastric pain: Secondary | ICD-10-CM | POA: Diagnosis present

## 2015-07-03 DIAGNOSIS — F419 Anxiety disorder, unspecified: Secondary | ICD-10-CM | POA: Diagnosis present

## 2015-07-03 DIAGNOSIS — Z8249 Family history of ischemic heart disease and other diseases of the circulatory system: Secondary | ICD-10-CM | POA: Diagnosis not present

## 2015-07-03 DIAGNOSIS — F418 Other specified anxiety disorders: Secondary | ICD-10-CM | POA: Insufficient documentation

## 2015-07-03 DIAGNOSIS — Z681 Body mass index (BMI) 19 or less, adult: Secondary | ICD-10-CM | POA: Diagnosis not present

## 2015-07-03 DIAGNOSIS — K760 Fatty (change of) liver, not elsewhere classified: Secondary | ICD-10-CM | POA: Diagnosis present

## 2015-07-03 LAB — URINALYSIS, ROUTINE W REFLEX MICROSCOPIC
BILIRUBIN URINE: NEGATIVE
GLUCOSE, UA: NEGATIVE mg/dL
HGB URINE DIPSTICK: NEGATIVE
KETONES UR: NEGATIVE mg/dL
LEUKOCYTES UA: NEGATIVE
NITRITE: NEGATIVE
PROTEIN: NEGATIVE mg/dL
Specific Gravity, Urine: <1.005 — ABNORMAL LOW (ref 1.005–1.030)
pH: 5.5 (ref 5.0–8.0)

## 2015-07-03 LAB — C DIFFICILE QUICK SCREEN W PCR REFLEX
C DIFFICILE (CDIFF) TOXIN: NEGATIVE
C DIFFICLE (CDIFF) ANTIGEN: NEGATIVE
C Diff interpretation: NEGATIVE

## 2015-07-03 LAB — GLUCOSE, CAPILLARY: Glucose-Capillary: 90 mg/dL (ref 65–99)

## 2015-07-03 LAB — DIFFERENTIAL
Basophils Absolute: 0.1 10*3/uL (ref 0.0–0.1)
Basophils Relative: 2 %
EOS ABS: 0.2 10*3/uL (ref 0.0–0.7)
EOS PCT: 4 %
LYMPHS PCT: 37 %
Lymphs Abs: 1.6 10*3/uL (ref 0.7–4.0)
MONO ABS: 0.5 10*3/uL (ref 0.1–1.0)
Monocytes Relative: 11 %
NEUTROS PCT: 46 %
Neutro Abs: 2 10*3/uL (ref 1.7–7.7)

## 2015-07-03 LAB — PHOSPHORUS: Phosphorus: 3.4 mg/dL (ref 2.5–4.6)

## 2015-07-03 LAB — LACTIC ACID, PLASMA: LACTIC ACID, VENOUS: 0.7 mmol/L (ref 0.5–2.0)

## 2015-07-03 MED ORDER — ENOXAPARIN SODIUM 40 MG/0.4ML ~~LOC~~ SOLN
40.0000 mg | SUBCUTANEOUS | Status: DC
Start: 2015-07-03 — End: 2015-07-05
  Administered 2015-07-04 – 2015-07-05 (×2): 40 mg via SUBCUTANEOUS
  Filled 2015-07-03 (×2): qty 0.4

## 2015-07-03 MED ORDER — CALCIUM CARBONATE 1250 (500 CA) MG PO TABS
1.0000 | ORAL_TABLET | Freq: Every day | ORAL | Status: DC
  Administered 2015-07-03 – 2015-07-05 (×3): 500 mg via ORAL
  Filled 2015-07-03 (×3): qty 1

## 2015-07-03 MED ORDER — POLYETHYLENE GLYCOL 3350 17 G PO PACK: 17.0000 g | PACK | Freq: Every day | ORAL | Status: DC | PRN

## 2015-07-03 MED ORDER — VITAMIN D (ERGOCALCIFEROL) 1.25 MG (50000 UNIT) PO CAPS
50000.0000 [IU] | ORAL_CAPSULE | ORAL | Status: DC
  Administered 2015-07-03: 50000 [IU] via ORAL
  Filled 2015-07-03 (×2): qty 1

## 2015-07-03 MED ORDER — BOOST / RESOURCE BREEZE PO LIQD
1.0000 | Freq: Three times a day (TID) | ORAL | Status: DC
  Administered 2015-07-03 – 2015-07-04 (×4): 1 via ORAL

## 2015-07-03 MED ORDER — ALPRAZOLAM 0.5 MG PO TABS
0.5000 mg | ORAL_TABLET | Freq: Two times a day (BID) | ORAL | Status: DC
  Administered 2015-07-03 – 2015-07-05 (×5): 0.5 mg via ORAL
  Filled 2015-07-03 (×6): qty 1

## 2015-07-03 MED ORDER — ACETAMINOPHEN 325 MG PO TABS
650.0000 mg | ORAL_TABLET | Freq: Four times a day (QID) | ORAL | Status: DC | PRN
Start: 2015-07-03 — End: 2015-07-05

## 2015-07-03 MED ORDER — POLYETHYLENE GLYCOL 3350 17 G PO PACK: 17.0000 g | PACK | Freq: Two times a day (BID) | ORAL | Status: DC

## 2015-07-03 MED ORDER — HYDROMORPHONE HCL 1 MG/ML IJ SOLN
1.0000 mg | INTRAMUSCULAR | Status: AC | PRN
  Administered 2015-07-03 (×3): 1 mg via INTRAVENOUS
  Filled 2015-07-03 (×3): qty 1

## 2015-07-03 MED ORDER — BOOST / RESOURCE BREEZE PO LIQD
1.0000 | Freq: Two times a day (BID) | ORAL | Status: DC
  Administered 2015-07-03: 1 via ORAL

## 2015-07-03 MED ORDER — ONDANSETRON HCL 4 MG/2ML IJ SOLN: 4.0000 mg | Freq: Three times a day (TID) | INTRAMUSCULAR | Status: AC | PRN

## 2015-07-03 MED ORDER — HYDRALAZINE HCL 20 MG/ML IJ SOLN: 5.0000 mg | Freq: Four times a day (QID) | INTRAMUSCULAR | Status: DC | PRN

## 2015-07-03 MED ORDER — SODIUM CHLORIDE 0.9 % IV SOLN
1000.0000 mL | INTRAVENOUS | Status: DC
  Administered 2015-07-03: 1000 mL via INTRAVENOUS

## 2015-07-03 MED ORDER — PANTOPRAZOLE SODIUM 40 MG IV SOLR
40.0000 mg | Freq: Once | INTRAVENOUS | Status: AC
  Administered 2015-07-03: 40 mg via INTRAVENOUS
  Filled 2015-07-03: qty 40

## 2015-07-03 MED ORDER — ADULT MULTIVITAMIN W/MINERALS CH
1.0000 | ORAL_TABLET | Freq: Every day | ORAL | Status: DC
  Administered 2015-07-03 – 2015-07-05 (×3): 1 via ORAL
  Filled 2015-07-03 (×3): qty 1

## 2015-07-03 MED ORDER — POTASSIUM CHLORIDE IN NACL 40-0.9 MEQ/L-% IV SOLN
INTRAVENOUS | Status: DC
  Administered 2015-07-03 – 2015-07-04 (×3): 100 mL/h via INTRAVENOUS

## 2015-07-03 MED ORDER — ONDANSETRON 4 MG PO TBDP: 4.0000 mg | ORAL_TABLET | Freq: Four times a day (QID) | ORAL | Status: DC | PRN

## 2015-07-03 MED ORDER — LISINOPRIL 10 MG PO TABS: 20.0000 mg | ORAL_TABLET | Freq: Every day | ORAL | Status: DC

## 2015-07-03 MED ORDER — PANTOPRAZOLE SODIUM 40 MG IV SOLR
40.0000 mg | Freq: Two times a day (BID) | INTRAVENOUS | Status: DC
Start: 2015-07-03 — End: 2015-07-04
  Administered 2015-07-03 – 2015-07-04 (×3): 40 mg via INTRAVENOUS
  Filled 2015-07-03 (×3): qty 40

## 2015-07-03 MED ORDER — ZOLPIDEM TARTRATE 5 MG PO TABS
5.0000 mg | ORAL_TABLET | Freq: Every day | ORAL | Status: DC
  Administered 2015-07-03 – 2015-07-04 (×2): 5 mg via ORAL
  Filled 2015-07-03 (×2): qty 1

## 2015-07-03 MED ORDER — DIATRIZOATE MEGLUMINE & SODIUM 66-10 % PO SOLN
ORAL | Status: AC
  Administered 2015-07-03: 30 mL
  Filled 2015-07-03: qty 30

## 2015-07-03 MED ORDER — METOCLOPRAMIDE HCL 5 MG/ML IJ SOLN
10.0000 mg | Freq: Four times a day (QID) | INTRAMUSCULAR | Status: DC
  Administered 2015-07-03 – 2015-07-05 (×8): 10 mg via INTRAVENOUS
  Filled 2015-07-03 (×9): qty 2

## 2015-07-03 MED ORDER — HYDROMORPHONE HCL 1 MG/ML IJ SOLN
1.0000 mg | Freq: Once | INTRAMUSCULAR | Status: AC
  Administered 2015-07-03: 1 mg via INTRAVENOUS
  Filled 2015-07-03: qty 1

## 2015-07-03 MED ORDER — SODIUM CHLORIDE 0.9 % IV SOLN
1000.0000 mL | Freq: Once | INTRAVENOUS | Status: AC
  Administered 2015-07-03: 1000 mL via INTRAVENOUS

## 2015-07-03 MED ORDER — PAROXETINE HCL 20 MG PO TABS
40.0000 mg | ORAL_TABLET | Freq: Every morning | ORAL | Status: DC
  Administered 2015-07-03 – 2015-07-05 (×3): 40 mg via ORAL
  Filled 2015-07-03 (×3): qty 2

## 2015-07-03 MED ORDER — METOCLOPRAMIDE HCL 10 MG PO TABS: 10.0000 mg | ORAL_TABLET | Freq: Three times a day (TID) | ORAL | Status: DC

## 2015-07-03 MED ORDER — IOPAMIDOL (ISOVUE-300) INJECTION 61%
100.0000 mL | Freq: Once | INTRAVENOUS | Status: AC | PRN
  Administered 2015-07-03: 100 mL via INTRAVENOUS

## 2015-07-03 MED ORDER — OXYCODONE HCL 5 MG PO TABS
5.0000 mg | ORAL_TABLET | ORAL | Status: DC | PRN
Start: 2015-07-03 — End: 2015-07-05
  Administered 2015-07-03 – 2015-07-05 (×9): 10 mg via ORAL
  Filled 2015-07-03 (×9): qty 2

## 2015-07-03 MED ORDER — HYDROMORPHONE HCL 1 MG/ML IJ SOLN: 1.0000 mg | INTRAMUSCULAR | Status: DC | PRN

## 2015-07-03 MED ORDER — ONDANSETRON HCL 4 MG/2ML IJ SOLN
4.0000 mg | Freq: Once | INTRAMUSCULAR | Status: AC
  Administered 2015-07-03: 4 mg via INTRAVENOUS
  Filled 2015-07-03: qty 2

## 2015-07-03 MED ORDER — METOCLOPRAMIDE HCL 5 MG/ML IJ SOLN
10.0000 mg | Freq: Once | INTRAMUSCULAR | Status: AC
  Administered 2015-07-03: 10 mg via INTRAVENOUS
  Filled 2015-07-03: qty 2

## 2015-07-03 MED ORDER — ACETAMINOPHEN 650 MG RE SUPP: 650.0000 mg | Freq: Four times a day (QID) | RECTAL | Status: DC | PRN

## 2015-07-03 MED ORDER — CALCIUM GLUCONATE 500 MG PO TABS: 1.0000 | ORAL_TABLET | Freq: Every day | ORAL | Status: DC

## 2015-07-03 MED ORDER — SENNA 8.6 MG PO TABS
2.0000 | ORAL_TABLET | Freq: Two times a day (BID) | ORAL | Status: DC
  Administered 2015-07-04 – 2015-07-05 (×2): 17.2 mg via ORAL
  Filled 2015-07-03 (×4): qty 2

## 2015-07-03 MED ORDER — PANTOPRAZOLE SODIUM 40 MG PO TBEC: 40.0000 mg | DELAYED_RELEASE_TABLET | Freq: Two times a day (BID) | ORAL | Status: DC

## 2015-07-03 NOTE — ED Notes (Signed)
Md at the bedside

## 2015-07-03 NOTE — Progress Notes (Addendum)
[facility] PROGRESS NOTE  Jordan Haney [id number] DOB: 01/18/1956 DOA: 07/02/2015 PCP: [name]  Assessment/Plan: 1. Intractable epigastric pain - Etiology remains unclear  - CT and EGD performed during recent admissions, reviewed and no findings identified that explain her presentation  - Largely normal labs.  -will repeat CT scan as recommended by GI last time.  -will consult GI.  -continue with Reglan, Zofran PRN  2. Nausea, vomiting  - Uncertain etiology  - Gastroparesis is considered, possibly post-viral as pt is not diabetic  - Continue antiemetics, prokinetics prn  - Clear liquid diet, advance as tolerated   3. Hypokalemia  - Serum potassium is 3.4 on admission  - Replacing with IVF  - Check mag level   4. Depression, anxiety  - Suspect this may be contributing to #1 above  - Continue Paxil, Xanax   5. Hypertension  - At goal currently  - hold lisinopril.  PRN hydralazine  Code Status: Full Code.  Family Communication:care discussed with patient  Disposition Plan: remain inpatient    Consultants:  GI  Procedures:  none  Antibiotics:  none  HPI/Subjective: She presents again with abdominal pain that started 2 days ago. She was doing well prior to 2 days ago.  She report 3 to 4 loose BM a day, nausea and vomiting.   Objective: Filed Vitals:   07/03/15 0630 07/03/15 0835  BP: 144/93 147/91  Pulse: 68 79  Temp:  97.8 F (36.6 C)  Resp:  18   No intake or output data in the 24 hours ending 07/03/15 1040 Filed Weights   07/02/15 2213  Weight: 49.896 kg (110 lb)    Exam:   General:  NAD  Cardiovascular: S 1, S 2 RRR  Respiratory: CTA  Abdomen: BS present, soft, nt, old midline scar  Musculoskeletal: no edema   Data Reviewed: Basic Metabolic Panel:  Recent Labs Lab 07/02/15 2230 07/02/15 2237  NA  --  140  K  --  3.4*  CL  --  107  CO2  --  24  GLUCOSE  --  103*  BUN  --  10   CREATININE  --  0.70  CALCIUM  --  8.9  PHOS 3.4  --    Liver Function Tests:  Recent Labs Lab 07/02/15 2237  AST 25  ALT 15  ALKPHOS 120  BILITOT 0.4  PROT 6.8  ALBUMIN 3.8    Recent Labs Lab 07/02/15 2237  LIPASE 27   No results for input(s): AMMONIA in the last 168 hours. CBC:  Recent Labs Lab 07/02/15 2237  WBC 5.0  NEUTROABS 2.0  HGB 10.7*  HCT 32.9*  MCV 79.9  PLT 374   Cardiac Enzymes: No results for input(s): CKTOTAL, CKMB, CKMBINDEX, TROPONINI in the last 168 hours. BNP (last 3 results) No results for input(s): BNP in the last 8760 hours.  ProBNP (last 3 results) No results for input(s): PROBNP in the last 8760 hours.  CBG: No results for input(s): GLUCAP in the last 168 hours.  No results found for this or any previous visit (from the past 240 hour(s)).   Studies: No results found.  Scheduled Meds: . ALPRAZolam  0.5 mg Oral BID  . calcium carbonate  1 tablet Oral Q breakfast  . enoxaparin (LOVENOX) injection  40 mg Subcutaneous Q24H  . feeding supplement  1 Container Oral BID BM  . metoCLOPramide (REGLAN) injection  10 mg Intravenous 4 times per day  . pantoprazole (PROTONIX) IV  40 mg Intravenous Q12H  . PARoxetine  40 mg Oral q morning - 10a  . senna  2 tablet Oral BID  . Vitamin D (Ergocalciferol)  50,000 Units Oral Once per day on Mon Thu  . zolpidem  5 mg Oral QHS   Continuous Infusions: . 0.9 % NaCl with KCl 40 mEq / L 100 mL/hr (07/03/15 0829)    Principal Problem:   Intractable abdominal pain Active Problems:   Nausea and vomiting   Hypokalemia   PUD (peptic ulcer disease)   Epigastric pain   Anemia    Time spent: 25 minutes.     Niel Hummer A  [facility] Pager [phone]. If 7PM-7AM, please contact night-coverage at [email or web address], password [facility] 07/03/2015, 10:40 AM

## 2015-07-03 NOTE — ED Notes (Signed)
Hospitalist at the bedside 

## 2015-07-03 NOTE — ED Notes (Signed)
Pt sleeping currently.

## 2015-07-03 NOTE — ED Notes (Signed)
Pt calling out for more pain medication, MD aware

## 2015-07-03 NOTE — Progress Notes (Signed)
Initial Nutrition Assessment  DOCUMENTATION CODES:  Non-severe (moderate) malnutrition in context of chronic illness   Pt meets criteria for MODERATE MALNUTRITION in the context of Chronic Illness as evidenced by Moderate fat loss and severe muscle loss.  INTERVENTION:  Recommend Anemia work up, pallor noted as well as potential glossitis and potential supplementation dependant on etiology  MVI with minerals  Boost Breeze po TID, each supplement provides 250 kcal and 9 grams of protein   NUTRITION DIAGNOSIS:  Increased nutrient needs related to altered GI function as evidenced by moderate depletion of body fat and severe depletion of muscle mass despite reported adequate intake  GOAL:  Patient will meet greater than or equal to 90% of their needs  MONITOR:  PO intake, Supplement acceptance, Diet advancement, Labs  REASON FOR ASSESSMENT:  Malnutrition Screening Tool    ASSESSMENT:  60 y/o female PMHx HTN, Constipation, Depression, Anxiety, PUD (hx billroth? Gastrojejunostomy). Presents with chronic epigastric pain, n/v x 2 weeks. Have worsened over last 2 days. She has had multiple admissions for this issue.   Pt reports that she eats well. She says she eats 6 small meals each day and prioritizes high kcal/Pro foods. She takes Calcium+ D at home.   Pt believes that the reason she is so thin/emaciated id due to malabsorption related to her gastric bypass. She states "I am not absorbing". In addition to gastric bypass she states she has had a SBR of ~24" as a result of 2 different surgeries. The first 12" was taken as part of Gastrojejunostomy, the second 12 " was due to a SBO and bowl that had become "necrotic".   Pt says that at time of Gastric bypass she weighed 140 lbs. She has been recorded as weighing as little as 98 lbs. She appears to have gained some weight recently.   Micronutrient wise, pt does endorse excessive loss of hair, angular cheilitis (none seen today), her tongue  does appear smooth (potentially r/t anemia). Recommended she start taking a MVI with minerals at home.   She is lactose intolerant and says she cannot tolerate Ensure. However she later states she consumes "yogurt beverages" at home.    NFPE: Severe muscle wasting, most evident in interosseous muscles/, also moderate fat wasting   Labs reviewed: Low H/H   Recent Labs Lab 07/02/15 2230 07/02/15 2237  NA  --  140  K  --  3.4*  CL  --  107  CO2  --  24  BUN  --  10  CREATININE  --  0.70  CALCIUM  --  8.9  PHOS 3.4  --   GLUCOSE  --  103*   Diet Order:  Diet clear liquid Room service appropriate?: Yes; Fluid consistency:: Thin  Skin: Pallor  Last BM:  4/13  Height:  Ht Readings from Last 1 Encounters:  07/03/15 5\' 3"  (1.6 m)   Weight:  Wt Readings from Last 1 Encounters:  07/02/15 110 lb (49.896 kg)   Wt Readings from Last 10 Encounters:  07/02/15 110 lb (49.896 kg)  06/08/15 103 lb (46.72 kg)  05/28/15 107 lb 3.2 oz (48.626 kg)  04/06/15 105 lb (47.628 kg)  04/04/15 105 lb (47.628 kg)  10/23/12 106 lb (48.081 kg)  08/20/12 110 lb (49.896 kg)  07/10/12 106 lb 3.2 oz (48.172 kg)  06/24/12 111 lb 1.8 oz (50.4 kg)  06/22/12 102 lb 12.8 oz (46.63 kg)   Ideal Body Weight:  52.27 kg  BMI:  Body mass index  is 19.49 kg/(m^2).  Estimated Nutritional Needs:  Kcal:  1600-1800 kcals (32-36 kcal/kg bw) Protein:  65-75 g Pro (1.3-1.5 g/kg bw) Fluid:  1.6-1.8 liters   EDUCATION NEEDS:  No education needs identified at this time  Burtis Junes RD, LDN Clinical Nutrition Pager: [phone] 07/03/2015 1:52 PM

## 2015-07-03 NOTE — Consult Note (Signed)
Referring Provider: No ref. provider found Primary Care Physician:  [name] Primary Gastroenterologist:  Dr. Oneida Alar  Date of Admission: 07/02/15 Date of Consultation: 07/03/15  Reason for Consultation:  Intractable abdominal pain  HPI:  Jordan Haney is a 60 y.o. female with a past medical history ofhypertension, chronic constipation, depression, anxiety, and peptic ulcer disease and multiple recent admissions for similar issues. She presented to the ER yesterday evening with acute on chronic epigastric pain with N/V, constant for the past 2 weeks and worsening yesterday prir to presentation. In the ED pain eas described as sharp, epigastric, worse with meals, and improved when laying still. These symptoms have been recurrent for several months and, infact, she was admitted from 05/27/15-06/02/15 and again 06/08/15 - 06/17/15 for similar issues. Has had CT of the abdomen which noted constipation and urinary retention but no other acute findings. Upper endoscopy last completed 06/11/15 which found Roux-en-Y anastomosis with healthy-appearing tissue and normal study. Saw PCP prir to ER presentation, was diagnosed with pancreatitis and referred to the ER.  In the ER vitals were stable, CMP unremarkable except for mild hypokalemia, CBC with stable hgb 10.7, lipase normal at 27. Review of historic lipase level show consistently normal lipase. She was given fluids and multiple doses of Dilaudid in addition to Zofran, Reglan, and protonix without improvement.  Today she states she's still having abdominal pain, N/V. Last episode of emesis this morning. She was doing "just fine" until 2 days ago when the pain started back along with N/V and diarrhea "but not watery." Typically she does not have diarrhea with her symtomatic episodes. Pain is epigastric, radiates tot he back and burning. States nausea and vomiting typically 5 minutes after eating. Denies hematochezia and melena. Last episode of  emesis this morning. Last bowel movement last night. Denies fever, chills. No other upper or lower GI symptoms.  Past Medical History  Diagnosis Date  . RA (rheumatoid arthritis) ([place])  2008  . Constipation 03/21/2011  . Hypokalemia 03/21/2011  . Fatty liver 03/21/2011  . Pancreatitis     states in remote past, very severe, ?biliary pancreatitis   . T12 compression fracture ([place]) 2011  . Duodenal ulcer     remote per patient. +BC powders, patient report negative EGD six months ago  . Dilated pancreatic duct ([place])     ?chronic pancreatitis   . Kidney stones   . Chronic abdominal pain   . Pneumonia     as child  . Anemia   . Lumbar stress fracture   . Anxiety   . Essential hypertension 06/09/2015    Past Surgical History  Procedure Laterality Date  . Cholecystectomy    . Knee surgery    . Appendectomy    . Complete hysterectomy    . Ventral wall hernia    . Esophagogastroduodenoscopy  08/2010    Dr. Earley Brooke, [place]. small hh, mild prepylori gastritis. path unavailable  . Esophagogastroduodenoscopy  04/2010    Dr. Earley Brooke, [place]. small hh, mild distal esophagitis, antral gastritis and duodenitis. Bx mild chronic gastritis and no H.pylori  . Esophagogastroduodenoscopy  08/2009    Dr. Posey Pronto, [place]. Moderate gastritis, moderate duodenitis with nodularity in proximal duodenal bulb, bx chronic gastritis, no helicobacter, mild chronic duodenitis  . Esophagogastroduodenoscopy  02/2007    Dr. Earley Brooke, [place]. antral gastritis and duodenal ulcers. Bx mild chronic gastritis. No bx from duodenal ulcer available.  [name] dilation  04/06/2011    Procedure: SAVORY DILATION;  Surgeon: Dorothyann Peng,  MD;  Location: [facility];  Service: Endoscopy;  Laterality: N/A;  16 mm  . Esophagogastroduodenoscopy  04/06/11    Dr. Oneida Alar: 1-2 cm hiatal hernia, mod gastritis, 57mmX3mm linear ulcer in duodenal bulb, stricture 1st part of duodenum, dilated to 12 mm  . Esophagogastroduodenoscopy  05/12/11    partial  gastric oulet obstruction secondary to stricture between bulb/2nd portion of duodenum with marked friability and inflammation but no discrete ulcer, dilated stomach. s/p dilation but high risk for restenosis  . Esophagogastroduodenoscopy  06/22/2011    [id number] ring/Moderate gastritis/PERSISTENT Ulcer in the bulb/descending duodenum Stricture in the bulb/descending duodenum  . Gastrojejunostomy  06/28/2011    Roux-en-Y with choledochoduodenostomy   . Abdominal hysterectomy    . Kidney stone surgery    . Esophagogastroduodenoscopy Left 06/25/2012    RMR: Normal esophagus. Surgically altered anastomosis to  small intestine noted. Couple of  at the anastomosis.  Patent efferent limb Patient had an oozing 1 cm anastomotic ulcer  . Back surgery  2013  . Colonoscopy N/A 08/16/2012    Dr. Gala Romney: normal but inadequate prep. Consider early interval colonoscopy.   . Cardiac catheterization      2008  . Esophagogastroduodenoscopy Left 10/23/2012    Dr. Gala Romney: mild erosive reflux esophagitis, s/p hemigastrectomy, prior site of gastric ulcer healed   . Total hip arthroplasty Left 01/2015    in [place], [place]  . Total hip arthroplasty    . Kyphoplasty  Jan 2017    [place], [place]   . Exploratory laparotomy w/ bowel resection      2014, sometime in the summer   . Esophagogastroduodenoscopy N/A 06/11/2015    Procedure: ESOPHAGOGASTRODUODENOSCOPY (EGD);  Surgeon: Daneil Dolin, MD;  Location: [facility];  Service: Endoscopy;  Laterality: N/A;    Prior to Admission medications   Medication Sig Start Date End Date Taking? Authorizing Provider  ALPRAZolam Duanne Moron) 0.5 MG tablet Take 0.5 mg by mouth 2 (two) times daily.    Historical Provider, MD  calcium gluconate 500 MG tablet Take 1 tablet by mouth daily.    Historical Provider, MD  feeding supplement (BOOST / RESOURCE BREEZE) LIQD Take 1 Container by mouth 2 (two) times daily between meals. 06/17/15   Kathie Dike, MD  lisinopril (PRINIVIL,ZESTRIL) 20 MG  tablet Take 20 mg by mouth daily.    Historical Provider, MD  metoCLOPramide (REGLAN) 10 MG tablet Take 1 tablet (10 mg total) by mouth 3 (three) times daily before meals. 06/17/15   Kathie Dike, MD  ondansetron (ZOFRAN ODT) 4 MG disintegrating tablet Take 1 tablet (4 mg total) by mouth every 6 (six) hours as needed for nausea or vomiting. 06/17/15   Kathie Dike, MD  oxyCODONE (OXY IR/ROXICODONE) 5 MG immediate release tablet Take 1-2 tablets (5-10 mg total) by mouth every 4 (four) hours as needed for moderate pain. 06/17/15   Kathie Dike, MD  pantoprazole (PROTONIX) 40 MG tablet Take 1 tablet (40 mg total) by mouth 2 (two) times daily. 06/02/15   Kathie Dike, MD  PARoxetine (PAXIL) 40 MG tablet Take 40 mg by mouth every morning.    Historical Provider, MD  polyethylene glycol (MIRALAX / GLYCOLAX) packet Take 17 g by mouth 2 (two) times daily. 06/02/15   Kathie Dike, MD  senna (SENOKOT) 8.6 MG TABS tablet Take 2 tablets (17.2 mg total) by mouth 2 (two) times daily. 06/02/15   Kathie Dike, MD  Vitamin D, Ergocalciferol, (DRISDOL) 50000 units CAPS capsule Take 50,000 Units by mouth 2 (two)  times a week.    Historical Provider, MD  zolpidem (AMBIEN) 5 MG tablet Take 5 mg by mouth at bedtime.     Historical Provider, MD    Current Facility-Administered Medications  Medication Dose Route Frequency Provider Last Rate Last Dose  . 0.9 % NaCl with KCl 40 mEq / L  infusion   Intravenous Continuous Vianne Bulls, MD 100 mL/hr at 07/03/15 0829 100 mL/hr at 07/03/15 0829  . acetaminophen (TYLENOL) tablet 650 mg  650 mg Oral Q6H PRN Vianne Bulls, MD       Or  . acetaminophen (TYLENOL) suppository 650 mg  650 mg Rectal Q6H PRN Vianne Bulls, MD      . ALPRAZolam Duanne Moron) tablet 0.5 mg  0.5 mg Oral BID Vianne Bulls, MD      . calcium carbonate (OS-CAL - dosed in mg of elemental calcium) tablet 500 mg of elemental calcium  1 tablet Oral Q breakfast Belkys A Regalado, MD      . enoxaparin  (LOVENOX) injection 40 mg  40 mg Subcutaneous Q24H Timothy S Opyd, MD      . feeding supplement (BOOST / RESOURCE BREEZE) liquid 1 Container  1 Container Oral BID BM Ilene Qua Opyd, MD      . HYDROmorphone (DILAUDID) injection 1 mg  1 mg Intravenous [id number] PRN Delora Fuel, MD   1 mg at [id number]  . metoCLOPramide (REGLAN) injection 10 mg  10 mg Intravenous 4 times per day Delora Fuel, MD      . ondansetron [facility]) injection 4 mg  4 mg Intravenous [id number] PRN Delora Fuel, MD      . ondansetron (ZOFRAN-ODT) disintegrating tablet 4 mg  4 mg Oral Q6H PRN Vianne Bulls, MD      . oxyCODONE (Oxy IR/ROXICODONE) immediate release tablet 5-10 mg  5-10 mg Oral Q4H PRN Vianne Bulls, MD      . pantoprazole (PROTONIX) injection 40 mg  40 mg Intravenous Q12H Timothy S Opyd, MD      . PARoxetine (PAXIL) tablet 40 mg  40 mg Oral q morning - 10a Timothy S Opyd, MD      . polyethylene glycol (MIRALAX / GLYCOLAX) packet 17 g  17 g Oral Daily PRN Belkys A Regalado, MD      . senna (SENOKOT) tablet 17.2 mg  2 tablet Oral BID Ilene Qua Opyd, MD   17.2 mg at 07/03/15 1000  . Vitamin D (Ergocalciferol) (DRISDOL) capsule 50,000 Units  50,000 Units Oral Once per day on Mon Thu Timothy S Opyd, MD      . zolpidem (AMBIEN) tablet 5 mg  5 mg Oral QHS Vianne Bulls, MD        Allergies as of 07/02/2015 - Review Complete 06/11/2015  Allergen Reaction Noted  . Compazine Anaphylaxis 03/20/2011  . Phenergan [promethazine] Anaphylaxis 10/21/2012  . Vistaril [hydroxyzine hcl] Other (See Comments) 03/20/2011  . Benadryl [diphenhydramine hcl] Other (See Comments) 06/25/2012  . Gabapentin Other (See Comments) 07/21/2011  . Latex Swelling 06/28/2011    Family History  Problem Relation Age of Onset  . Colon cancer Neg Hx   . Liver cancer Neg Hx   . Breast cancer Neg Hx   . Inflammatory bowel disease Neg Hx   . Heart attack Mother     Social History   Social History  . Marital Status: Divorced    Spouse Name: N/A  .  Number of Children: 1  . Years of  Education: N/A   Occupational History  . unemployed    Social History Main Topics  . Smoking status: Never Smoker   . Smokeless tobacco: Never Used  . Alcohol Use: No  . Drug Use: No  . Sexual Activity: Yes    Birth Control/ Protection: Surgical   Other Topics Concern  . Not on file   Social History Narrative    Review of Systems: Gen: Denies fever, chills, change in weight or weight loss CV: Denies chest pain, heart palpitations, syncope, edema  Resp: Denies shortness of breath with rest, cough, wheezing GI: See HPI.  Heme: Denies bruising, bleeding.  Physical Exam: Vital signs in last 24 hours: Temp:  [97.8 F (36.6 C)-99 F (37.2 C)] 97.8 F (36.6 C) (04/13 0835) Pulse Rate:  [68-104] 79 (04/13 0835) Resp:  [16-18] 18 (04/13 0835) BP: (128-158)/(91-113) 147/91 mmHg (04/13 0835) SpO2:  [95 %-100 %] 100 % (04/13 0835) Weight:  [110 lb (49.896 kg)] 110 lb (49.896 kg) (04/12 2213) Last BM Date: 07/03/15 General:   Alert,  Well-developed, well-nourished, pleasant and cooperative in NAD. Appears uncomfortable. Head:  Normocephalic and atraumatic. Eyes:  Sclera clear, no icterus. Conjunctiva pink. Ears:  Normal auditory acuity. Lungs:  Clear throughout to auscultation.   No wheezes, crackles, or rhonchi. No acute distress. Heart:  Regular rate and rhythm; no murmurs, clicks, rubs,  or gallops. Abdomen:  Soft, and nondistended. TTP epigastric area. No masses, hepatosplenomegaly or hernias noted. Normal bowel sounds, without guarding, and without rebound.   Rectal:  Deferred.   Msk:  Symmetrical without gross deformities. Extremities:  Without clubbing or edema. Neurologic:  Alert and  oriented x4;  grossly normal neurologically. Psych:  Alert and cooperative. Normal mood and affect.  Intake/Output from previous day:   Intake/Output this shift:    Lab Results:  Recent Labs  07/02/15 2237  WBC 5.0  HGB 10.7*  HCT 32.9*  PLT  374   BMET  Recent Labs  07/02/15 2237  NA 140  K 3.4*  CL 107  CO2 24  GLUCOSE 103*  BUN 10  CREATININE 0.70  CALCIUM 8.9   LFT  Recent Labs  07/02/15 2237  PROT 6.8  ALBUMIN 3.8  AST 25  ALT 15  ALKPHOS 120  BILITOT 0.4   PT/INR No results for input(s): LABPROT, INR in the last 72 hours. Hepatitis Panel No results for input(s): HEPBSAG, HCVAB, HEPAIGM, HEPBIGM in the last 72 hours. C-Diff No results for input(s): CDIFFTOX in the last 72 hours.  Studies/Results: No results found.  Impression: 60 year old complicated female with recurrent admissions for epigastric pain, N/V. She has had a pretty extensive work-up during these recent admissions including CT abdomen/pelvis and EGD, which were without acute process to explain her symptoms. PCP told her she has pancreatitis, lipase is normal. H/H stable. She is also having diarrhea with her other symptoms this time. Pain and N/V continue despite fluids, dialudid, oxycodone, scheduled reglan, prn zofran, Protonix.  Repeat CT abdomen/pelvis has been ordered for today.Diagnostic UGI with water CM 07/02/11 showed very slow emptying of contrast from stomach (presume gastroparesis). Given new diarrhea, could also have GI infection. Given the recurrent and elusive nature of her symptoms, may ultimately benefit from tertiary care center referral. No urgent needs for EGD at this time.   Plan: 1. CMP 2. Stool for C. Diff, GI path panel 3. Continue pain meds 4. Continue scheduled Reglan and prn Zofran 5. Continue to monitor for changes  Walden Field, AGNP-C Adult & Gerontological Nurse Practitioner [facility]        07/03/2015, 10:26 AM

## 2015-07-03 NOTE — H&P (Signed)
[facility] History and Physical  LINDEE VOLMAR [id number] DOB: June 28, 1955 DOA: 07/02/2015  Referring physician: ED physician PCP: [name]  Specialists:  None listed  Chief Complaint:  Epigastric pain, nausea, vomiting  HPI: Jordan Haney is a 60 y.o. female with PMH of hypertension, chronic constipation, depression, anxiety, and peptic ulcer disease who presents to the ED with acute on chronic epigastric pain with nausea and vomiting. Patient reports that her symptoms have been constant for the past 2 weeks. She reports at that time to worsen yesterday. She describes her epigastric pain has severe, sharp, localized to the epigastrium, worse with meals, and better with remaining still. She has had the same symptoms intermittently for several months. She was admitted to this institution from 05/27/2015 to 06/02/2015 under very similar circumstances. She underwent abdominal CT at that time which was notable for constipation and urinary retention, but no other acute findings were identified. She was again admitted to this institution from 3/19 to 06/17/2015 under the same circumstances. Upper endoscopy was performed during that admission and notable for a Roux-en-Y anastomosis with healthy-appearing tissue; study was normal. She reports seeing her primary care physician for these complaints, states that she was diagnosed with pancreatitis and directed to the emergency department.  In ED, patient was found to be afebrile, saturating well on room air, and with vital signs stable. CMP features a mild hypokalemia but is otherwise unremarkable. CBC features a hemoglobin of 10.7 which is stable relative to prior measurements. Lactic acid is reassuring 0.7. Lipase level returned normal at 27. Patient was given a 1 L normal saline bolus in the emergency department and continued on normal saline infusion. She she was treated with Zofran, Reglan, Protonix, and multiple doses of Dilaudid  IV. Patient's pain remained uncontrolled and the hospitalists were asked to admit for intractable abdominal pain.  Where does patient live?   At home     Can patient participate in ADLs?  Yes         Review of Systems:   General: no fevers, chills, sweats, weight change, poor appetite, or fatigue HEENT: no blurry vision, hearing changes or sore throat Pulm: no dyspnea, cough, or wheeze CV: no chest pain or palpitations Abd: no diarrhea or constipation. Epigastric pain, nausea, vomiting GU: no dysuria, hematuria, increased urinary frequency, or urgency  Ext: no leg edema Neuro: no focal weakness, numbness, or tingling, no vision change or hearing loss Skin: no rash, no wounds MSK: No muscle spasm, no deformity, no red, hot, or swollen joint Heme: No easy bruising or bleeding Travel history: No recent long distant travel    Allergy:  Allergies  Allergen Reactions  . Compazine Anaphylaxis  . Phenergan [Promethazine] Anaphylaxis  . Vistaril [Hydroxyzine Hcl] Other (See Comments)    Reaction: legs shake  . Benadryl [Diphenhydramine Hcl] Other (See Comments)    Pt states, "it makes my legs shake really bad"   . Gabapentin Other (See Comments)    Reactions: legs shake  . Latex Swelling    Past Medical History  Diagnosis Date  . RA (rheumatoid arthritis) ([place])  2008  . Constipation 03/21/2011  . Hypokalemia 03/21/2011  . Fatty liver 03/21/2011  . Pancreatitis     states in remote past, very severe, ?biliary pancreatitis   . T12 compression fracture ([place]) 2011  . Duodenal ulcer     remote per patient. +BC powders, patient report negative EGD six months ago  . Dilated pancreatic duct ([place])     ?  chronic pancreatitis   . Kidney stones   . Chronic abdominal pain   . Pneumonia     as child  . Anemia   . Lumbar stress fracture   . Anxiety   . Essential hypertension 06/09/2015    Past Surgical History  Procedure Laterality Date  . Cholecystectomy    . Knee surgery    .  Appendectomy    . Complete hysterectomy    . Ventral wall hernia    . Esophagogastroduodenoscopy  08/2010    Dr. Earley Brooke, [place]. small hh, mild prepylori gastritis. path unavailable  . Esophagogastroduodenoscopy  04/2010    Dr. Earley Brooke, [place]. small hh, mild distal esophagitis, antral gastritis and duodenitis. Bx mild chronic gastritis and no H.pylori  . Esophagogastroduodenoscopy  08/2009    Dr. Posey Pronto, [place]. Moderate gastritis, moderate duodenitis with nodularity in proximal duodenal bulb, bx chronic gastritis, no helicobacter, mild chronic duodenitis  . Esophagogastroduodenoscopy  02/2007    Dr. Earley Brooke, [place]. antral gastritis and duodenal ulcers. Bx mild chronic gastritis. No bx from duodenal ulcer available.  [name] dilation  04/06/2011    Procedure: SAVORY DILATION;  Surgeon: Dorothyann Peng, MD;  Location: [facility];  Service: Endoscopy;  Laterality: N/A;  16 mm  . Esophagogastroduodenoscopy  04/06/11    Dr. Oneida Alar: 1-2 cm hiatal hernia, mod gastritis, 27mmX3mm linear ulcer in duodenal bulb, stricture 1st part of duodenum, dilated to 12 mm  . Esophagogastroduodenoscopy  05/12/11    partial gastric oulet obstruction secondary to stricture between bulb/2nd portion of duodenum with marked friability and inflammation but no discrete ulcer, dilated stomach. s/p dilation but high risk for restenosis  . Esophagogastroduodenoscopy  06/22/2011    [id number] ring/Moderate gastritis/PERSISTENT Ulcer in the bulb/descending duodenum Stricture in the bulb/descending duodenum  . Gastrojejunostomy  06/28/2011    Roux-en-Y with choledochoduodenostomy   . Abdominal hysterectomy    . Kidney stone surgery    . Esophagogastroduodenoscopy Left 06/25/2012    RMR: Normal esophagus. Surgically altered anastomosis to  small intestine noted. Couple of  at the anastomosis.  Patent efferent limb Patient had an oozing 1 cm anastomotic ulcer  . Back surgery  2013  . Colonoscopy N/A 08/16/2012    Dr. Gala Romney: normal but  inadequate prep. Consider early interval colonoscopy.   . Cardiac catheterization      2008  . Esophagogastroduodenoscopy Left 10/23/2012    Dr. Gala Romney: mild erosive reflux esophagitis, s/p hemigastrectomy, prior site of gastric ulcer healed   . Total hip arthroplasty Left 01/2015    in [place], [place]  . Total hip arthroplasty    . Kyphoplasty  Jan 2017    [place], [place]   . Exploratory laparotomy w/ bowel resection      2014, sometime in the summer   . Esophagogastroduodenoscopy N/A 06/11/2015    Procedure: ESOPHAGOGASTRODUODENOSCOPY (EGD);  Surgeon: Daneil Dolin, MD;  Location: [facility];  Service: Endoscopy;  Laterality: N/A;    Social History:  reports that she has never smoked. She has never used smokeless tobacco. She reports that she does not drink alcohol or use illicit drugs.  Family History:  Family History  Problem Relation Age of Onset  . Colon cancer Neg Hx   . Liver cancer Neg Hx   . Breast cancer Neg Hx   . Inflammatory bowel disease Neg Hx   . Heart attack Mother      Prior to Admission medications   Medication Sig Start Date End Date Taking? Authorizing Provider  ALPRAZolam (  XANAX) 0.5 MG tablet Take 0.5 mg by mouth 2 (two) times daily.    Historical Provider, MD  calcium gluconate 500 MG tablet Take 1 tablet by mouth daily.    Historical Provider, MD  feeding supplement (BOOST / RESOURCE BREEZE) LIQD Take 1 Container by mouth 2 (two) times daily between meals. 06/17/15   Kathie Dike, MD  lisinopril (PRINIVIL,ZESTRIL) 20 MG tablet Take 20 mg by mouth daily.    Historical Provider, MD  metoCLOPramide (REGLAN) 10 MG tablet Take 1 tablet (10 mg total) by mouth 3 (three) times daily before meals. 06/17/15   Kathie Dike, MD  ondansetron (ZOFRAN ODT) 4 MG disintegrating tablet Take 1 tablet (4 mg total) by mouth every 6 (six) hours as needed for nausea or vomiting. 06/17/15   Kathie Dike, MD  oxyCODONE (OXY IR/ROXICODONE) 5 MG immediate release tablet Take 1-2  tablets (5-10 mg total) by mouth every 4 (four) hours as needed for moderate pain. 06/17/15   Kathie Dike, MD  pantoprazole (PROTONIX) 40 MG tablet Take 1 tablet (40 mg total) by mouth 2 (two) times daily. 06/02/15   Kathie Dike, MD  PARoxetine (PAXIL) 40 MG tablet Take 40 mg by mouth every morning.    Historical Provider, MD  polyethylene glycol (MIRALAX / GLYCOLAX) packet Take 17 g by mouth 2 (two) times daily. 06/02/15   Kathie Dike, MD  senna (SENOKOT) 8.6 MG TABS tablet Take 2 tablets (17.2 mg total) by mouth 2 (two) times daily. 06/02/15   Kathie Dike, MD  Vitamin D, Ergocalciferol, (DRISDOL) 50000 units CAPS capsule Take 50,000 Units by mouth 2 (two) times a week.    Historical Provider, MD  zolpidem (AMBIEN) 5 MG tablet Take 5 mg by mouth at bedtime.     Historical Provider, MD    Physical Exam: Filed Vitals:   07/03/15 0330 07/03/15 0500 07/03/15 0530 07/03/15 0600  BP: 145/107 134/95 128/91 129/96  Pulse: 75 75 92 69  Temp:      TempSrc:      Resp:      Height:      Weight:      SpO2: 99% 95% 96% 96%   General: Not in acute distress. Very thin, pale.  HEENT:       Eyes: PERRL, EOMI, no scleral icterus or conjunctival pallor.       ENT: No discharge from the ears or nose, no pharyngeal ulcers.        Neck: No JVD, no bruit, no appreciable mass Heme: No cervical adenopathy, no pallor Cardiac: S1/S2, RRR, No murmurs, No gallops or rubs. Pulm: Good air movement bilaterally. No rales, wheezing, rhonchi or rubs. Abd: Soft, nondistended, mild tenderness in epigastrum, no rebound pain or gaurding, no mass or organomegaly, BS present. Ext: No LE edema bilaterally. 2+DP/PT pulse bilaterally. Musculoskeletal: No gross deformity, no red, hot, swollen joints   Skin: No rashes or wounds on exposed surfaces  Neuro: Alert, oriented X3, cranial nerves II-XII grossly intact. No focal findings Psych: Patient is not overtly psychotic, denies suicidal or homocidal ideation, no active  hallucinations.  Labs on Admission:  Basic Metabolic Panel:  Recent Labs Lab 07/02/15 2237  NA 140  K 3.4*  CL 107  CO2 24  GLUCOSE 103*  BUN 10  CREATININE 0.70  CALCIUM 8.9   Liver Function Tests:  Recent Labs Lab 07/02/15 2237  AST 25  ALT 15  ALKPHOS 120  BILITOT 0.4  PROT 6.8  ALBUMIN 3.8    Recent  Labs Lab 07/02/15 2237  LIPASE 27   No results for input(s): AMMONIA in the last 168 hours. CBC:  Recent Labs Lab 07/02/15 2237  WBC 5.0  NEUTROABS 2.0  HGB 10.7*  HCT 32.9*  MCV 79.9  PLT 374   Cardiac Enzymes: No results for input(s): CKTOTAL, CKMB, CKMBINDEX, TROPONINI in the last 168 hours.  BNP (last 3 results) No results for input(s): BNP in the last 8760 hours.  ProBNP (last 3 results) No results for input(s): PROBNP in the last 8760 hours.  CBG: No results for input(s): GLUCAP in the last 168 hours.  Radiological Exams on Admission: No results found.  EKG:   Not done in ED, will obtain as appropriate   Assessment/Plan  1. Intractable epigastric pain - Etiology remains unclear  - CT and EGD performed during recent admissions, reviewed and no findings identified that explain her presentation  - Largely normal labs, benign exam, and stable vitals are reassuring   2. Nausea, vomiting  - Uncertain etiology  - Gastroparesis is considered, possibly post-viral as pt is not diabetic  - Continue antiemetics, prokinetics prn  - Clear liquid diet, advance as tolerated   - Monitor lytes, fluid status, replace prn   3. Hypokalemia  - Serum potassium is 3.4 on admission  - Replacing with IVF  - Check mag level and replace prn    4. Depression, anxiety  - Suspect this may be contributing to #1 above  - Continue Paxil, Xanax   5. Hypertension  - At goal currently  - Continue home-dose lisinopril     DVT ppx:  SQ Lovenox      Code Status: Full code Family Communication: None at bed side.            Disposition Plan: Admit to  inpatient   Date of Service 07/03/2015    Vianne Bulls, MD [facility] Pager [phone]  If 7PM-7AM, please contact night-coverage [email or web address] Password [email or web address] 07/03/2015, 6:18 AM

## 2015-07-04 DIAGNOSIS — E876 Hypokalemia: Secondary | ICD-10-CM

## 2015-07-04 DIAGNOSIS — E440 Moderate protein-calorie malnutrition: Secondary | ICD-10-CM

## 2015-07-04 LAB — GASTROINTESTINAL PANEL BY PCR, STOOL (REPLACES STOOL CULTURE)
Adenovirus F40/41: NOT DETECTED
Astrovirus: NOT DETECTED
CYCLOSPORA CAYETANENSIS: NOT DETECTED
Campylobacter species: NOT DETECTED
Cryptosporidium: NOT DETECTED
E. COLI O157: NOT DETECTED
ENTEROTOXIGENIC E COLI (ETEC): NOT DETECTED
Entamoeba histolytica: NOT DETECTED
Enteroaggregative E coli (EAEC): NOT DETECTED
Enteropathogenic E coli (EPEC): NOT DETECTED
Giardia lamblia: NOT DETECTED
NOROVIRUS GI/GII: NOT DETECTED
PLESIMONAS SHIGELLOIDES: NOT DETECTED
ROTAVIRUS A: NOT DETECTED
SAPOVIRUS (I, II, IV, AND V): NOT DETECTED
SHIGA LIKE TOXIN PRODUCING E COLI (STEC): NOT DETECTED
Salmonella species: NOT DETECTED
Shigella/Enteroinvasive E coli (EIEC): NOT DETECTED
Vibrio cholerae: NOT DETECTED
Vibrio species: NOT DETECTED
Yersinia enterocolitica: NOT DETECTED

## 2015-07-04 LAB — COMPREHENSIVE METABOLIC PANEL
ALBUMIN: 3.2 g/dL — AB (ref 3.5–5.0)
ALT: 13 U/L — AB (ref 14–54)
AST: 24 U/L (ref 15–41)
Alkaline Phosphatase: 99 U/L (ref 38–126)
Anion gap: 8 (ref 5–15)
CHLORIDE: 109 mmol/L (ref 101–111)
CO2: 25 mmol/L (ref 22–32)
Calcium: 8.6 mg/dL — ABNORMAL LOW (ref 8.9–10.3)
Creatinine, Ser: 0.39 mg/dL — ABNORMAL LOW (ref 0.44–1.00)
GFR calc Af Amer: >60 mL/min (ref >60)
GFR calc non Af Amer: >60 mL/min (ref >60)
Glucose, Bld: 89 mg/dL (ref 65–99)
Potassium: 4.3 mmol/L (ref 3.5–5.1)
SODIUM: 142 mmol/L (ref 135–145)
Total Bilirubin: 0.3 mg/dL (ref 0.3–1.2)
Total Protein: 5.8 g/dL — ABNORMAL LOW (ref 6.5–8.1)

## 2015-07-04 LAB — GLUCOSE, CAPILLARY: Glucose-Capillary: 80 mg/dL (ref 65–99)

## 2015-07-04 LAB — MAGNESIUM: Magnesium: 1.6 mg/dL — ABNORMAL LOW (ref 1.7–2.4)

## 2015-07-04 MED ORDER — PANTOPRAZOLE SODIUM 40 MG IV SOLR
40.0000 mg | Freq: Two times a day (BID) | INTRAVENOUS | Status: DC
  Administered 2015-07-04 – 2015-07-05 (×3): 40 mg via INTRAVENOUS
  Filled 2015-07-04 (×3): qty 40

## 2015-07-04 MED ORDER — MAGNESIUM SULFATE 2 GM/50ML IV SOLN
2.0000 g | Freq: Once | INTRAVENOUS | Status: AC
  Administered 2015-07-04: 2 g via INTRAVENOUS
  Filled 2015-07-04: qty 50

## 2015-07-04 MED ORDER — BOOST / RESOURCE BREEZE PO LIQD: 1.0000 | Freq: Three times a day (TID) | ORAL | Status: DC

## 2015-07-04 MED ORDER — KCL IN DEXTROSE-NACL 20-5-0.9 MEQ/L-%-% IV SOLN
INTRAVENOUS | Status: DC
  Administered 2015-07-04: 18:00:00 via INTRAVENOUS

## 2015-07-04 MED ORDER — PANTOPRAZOLE SODIUM 40 MG PO TBEC: 40.0000 mg | DELAYED_RELEASE_TABLET | Freq: Every day | ORAL | Status: DC

## 2015-07-04 MED ORDER — AMITRIPTYLINE HCL 10 MG PO TABS
10.0000 mg | ORAL_TABLET | Freq: Every day | ORAL | Status: DC
  Administered 2015-07-04: 10 mg via ORAL
  Filled 2015-07-04: qty 1

## 2015-07-04 NOTE — Progress Notes (Signed)
[facility] PROGRESS NOTE  [name] [id number] DOB: Jul 27, 1955    PCP:   [name]   HPI: Jordan Haney is an 60 y.o. female with hx of chronic intermittent abdominal pain, requiring several admission for same, hx of HTN, anxiety, chronic constipation, admitted for epigastric pain, nausea and vomiting.  Her serology was not revealing and neither was her abd/Pelvic CT.  She did have duodenitis and gastritis, with outlet stricture in the past when EGD was performed by Dr Oneida Alar.  It was dilated previously.  She also has had hx of Roux en Y previously.    Rewiew of Systems:  Constitutional: Negative for malaise, fever and chills. No significant weight loss or weight gain Eyes: Negative for eye pain, redness and discharge, diplopia, visual changes, or flashes of light. ENMT: Negative for ear pain, hoarseness, nasal congestion, sinus pressure and sore throat. No headaches; tinnitus, drooling, or problem swallowing. Cardiovascular: Negative for chest pain, palpitations, diaphoresis, dyspnea and peripheral edema. ; No orthopnea, PND Respiratory: Negative for cough, hemoptysis, wheezing and stridor. No pleuritic chestpain. Gastrointestinal: Negative for nausea, vomiting, diarrhea,blood in stool, hematemesis, jaundice and rectal bleeding.    Genitourinary: Negative for frequency, dysuria, incontinence,flank pain and hematuria; Musculoskeletal: Negative for back pain and neck pain. Negative for swelling and trauma.;  Skin: . Negative for pruritus, rash, abrasions, bruising and skin lesion.; ulcerations Neuro: Negative for headache, lightheadedness and neck stiffness. Negative for weakness, altered level of consciousness , altered mental status, extremity weakness, burning feet, involuntary movement, seizure and syncope.  Psych: negative for anxiety, depression, insomnia, tearfulness, panic attacks, hallucinations, paranoia, suicidal or homicidal ideation    Past  Medical History  Diagnosis Date  . RA (rheumatoid arthritis) ([place])  2008  . Constipation 03/21/2011  . Hypokalemia 03/21/2011  . Fatty liver 03/21/2011  . Pancreatitis     states in remote past, very severe, ?biliary pancreatitis   . T12 compression fracture ([place]) 2011  . Duodenal ulcer     remote per patient. +BC powders, patient report negative EGD six months ago  . Dilated pancreatic duct ([place])     ?chronic pancreatitis   . Kidney stones   . Chronic abdominal pain   . Pneumonia     as child  . Anemia   . Lumbar stress fracture   . Anxiety   . Essential hypertension 06/09/2015    Past Surgical History  Procedure Laterality Date  . Cholecystectomy    . Knee surgery    . Appendectomy    . Complete hysterectomy    . Ventral wall hernia    . Esophagogastroduodenoscopy  08/2010    Dr. Earley Brooke, [place]. small hh, mild prepylori gastritis. path unavailable  . Esophagogastroduodenoscopy  04/2010    Dr. Earley Brooke, [place]. small hh, mild distal esophagitis, antral gastritis and duodenitis. Bx mild chronic gastritis and no H.pylori  . Esophagogastroduodenoscopy  08/2009    Dr. Posey Pronto, [place]. Moderate gastritis, moderate duodenitis with nodularity in proximal duodenal bulb, bx chronic gastritis, no helicobacter, mild chronic duodenitis  . Esophagogastroduodenoscopy  02/2007    Dr. Earley Brooke, [place]. antral gastritis and duodenal ulcers. Bx mild chronic gastritis. No bx from duodenal ulcer available.  [name] dilation  04/06/2011    Procedure: SAVORY DILATION;  Surgeon: Dorothyann Peng, MD;  Location: [facility];  Service: Endoscopy;  Laterality: N/A;  16 mm  . Esophagogastroduodenoscopy  04/06/11    Dr. Oneida Alar: 1-2 cm hiatal hernia, mod gastritis, 18mmX3mm linear ulcer in duodenal bulb, stricture 1st  part of duodenum, dilated to 12 mm  . Esophagogastroduodenoscopy  05/12/11    partial gastric oulet obstruction secondary to stricture between bulb/2nd portion of duodenum with marked friability and  inflammation but no discrete ulcer, dilated stomach. s/p dilation but high risk for restenosis  . Esophagogastroduodenoscopy  06/22/2011    [id number] ring/Moderate gastritis/PERSISTENT Ulcer in the bulb/descending duodenum Stricture in the bulb/descending duodenum  . Gastrojejunostomy  06/28/2011    Roux-en-Y with choledochoduodenostomy   . Abdominal hysterectomy    . Kidney stone surgery    . Esophagogastroduodenoscopy Left 06/25/2012    RMR: Normal esophagus. Surgically altered anastomosis to  small intestine noted. Couple of  at the anastomosis.  Patent efferent limb Patient had an oozing 1 cm anastomotic ulcer  . Back surgery  2013  . Colonoscopy N/A 08/16/2012    Dr. Gala Romney: normal but inadequate prep. Consider early interval colonoscopy.   . Cardiac catheterization      2008  . Esophagogastroduodenoscopy Left 10/23/2012    Dr. Gala Romney: mild erosive reflux esophagitis, s/p hemigastrectomy, prior site of gastric ulcer healed   . Total hip arthroplasty Left 01/2015    in [place], [place]  . Total hip arthroplasty    . Kyphoplasty  Jan 2017    [place], [place]   . Exploratory laparotomy w/ bowel resection      2014, sometime in the summer   . Esophagogastroduodenoscopy N/A 06/11/2015    Procedure: ESOPHAGOGASTRODUODENOSCOPY (EGD);  Surgeon: Daneil Dolin, MD;  Location: [facility];  Service: Endoscopy;  Laterality: N/A;    Medications:  HOME MEDS: Prior to Admission medications   Medication Sig Start Date End Date Taking? Authorizing Provider  acetaminophen (TYLENOL) 500 MG tablet Take 1,000 mg by mouth daily as needed for mild pain.  06/27/15  Yes Historical Provider, MD  ALPRAZolam Duanne Moron) 0.5 MG tablet Take 0.5 mg by mouth 2 (two) times daily.   Yes Historical Provider, MD  calcium gluconate 500 MG tablet Take 1 tablet by mouth daily.   Yes Historical Provider, MD  calcium-vitamin D (CALCIUM 500/D) 500-200 MG-UNIT tablet Take 1 tablet by mouth daily with breakfast.    Yes Historical  Provider, MD  DULoxetine (CYMBALTA) 30 MG capsule Take 30 mg by mouth daily.  06/27/15  Yes Historical Provider, MD  feeding supplement (BOOST / RESOURCE BREEZE) LIQD Take 1 Container by mouth 2 (two) times daily between meals. 06/17/15  Yes Kathie Dike, MD  lisinopril (PRINIVIL,ZESTRIL) 20 MG tablet Take 20 mg by mouth daily.   Yes Historical Provider, MD  metoCLOPramide (REGLAN) 10 MG tablet Take 1 tablet (10 mg total) by mouth 3 (three) times daily before meals. 06/17/15  Yes Kathie Dike, MD  ondansetron (ZOFRAN ODT) 4 MG disintegrating tablet Take 1 tablet (4 mg total) by mouth every 6 (six) hours as needed for nausea or vomiting. 06/17/15  Yes Kathie Dike, MD  oxyCODONE (OXY IR/ROXICODONE) 5 MG immediate release tablet Take 1-2 tablets (5-10 mg total) by mouth every 4 (four) hours as needed for moderate pain. 06/17/15  Yes Kathie Dike, MD  pantoprazole (PROTONIX) 40 MG tablet Take 1 tablet (40 mg total) by mouth 2 (two) times daily. 06/02/15  Yes Kathie Dike, MD  polyethylene glycol (MIRALAX / GLYCOLAX) packet Take 17 g by mouth 2 (two) times daily. 06/02/15  Yes Kathie Dike, MD  senna (SENOKOT) 8.6 MG TABS tablet Take 2 tablets (17.2 mg total) by mouth 2 (two) times daily. 06/02/15  Yes Kathie Dike, MD  topiramate (TOPAMAX)  50 MG tablet Take 50 mg by mouth daily.  06/27/15  Yes Historical Provider, MD  Vitamin D, Ergocalciferol, (DRISDOL) 50000 units CAPS capsule Take 50,000 Units by mouth 2 (two) times a week.   Yes Historical Provider, MD  zolpidem (AMBIEN) 5 MG tablet Take 5 mg by mouth at bedtime.    Yes Historical Provider, MD     Allergies:  Allergies  Allergen Reactions  . Compazine Anaphylaxis  . Phenergan [Promethazine] Anaphylaxis  . Vistaril [Hydroxyzine Hcl] Other (See Comments)    Reaction: legs shake  . Benadryl [Diphenhydramine Hcl] Other (See Comments)    Pt states, "it makes my legs shake really bad"   . Gabapentin Other (See Comments)    Reactions: legs  shake  . Latex Swelling    Social History:   reports that she has never smoked. She has never used smokeless tobacco. She reports that she does not drink alcohol or use illicit drugs.  Family History: Family History  Problem Relation Age of Onset  . Colon cancer Neg Hx   . Liver cancer Neg Hx   . Breast cancer Neg Hx   . Inflammatory bowel disease Neg Hx   . Heart attack Mother      Physical Exam: Filed Vitals:   07/03/15 1701 07/03/15 2043 07/04/15 0655 07/04/15 1528  BP: 147/99 126/91 140/97 144/95  Pulse: 79 83 75 79  Temp: 98.4 F (36.9 C) 98.6 F (37 C) 97.9 F (36.6 C) 98.9 F (37.2 C)  TempSrc: Oral Oral Oral Oral  Resp: 20 20 20 18   Height:      Weight:      SpO2: 98% 96% 98% 97%   Blood pressure 144/95, pulse 79, temperature 98.9 F (37.2 C), temperature source Oral, resp. rate 18, height 5\' 3"  (1.6 m), weight 49.896 kg (110 lb), SpO2 97 %.  GEN:  Pleasant patient lying in the stretcher in no acute distress; cooperative with exam. PSYCH:  alert and oriented x4; does not appear anxious or depressed; affect is appropriate. HEENT: Mucous membranes pink and anicteric; PERRLA; EOM intact; no cervical lymphadenopathy nor thyromegaly or carotid bruit; no JVD; There were no stridor. Neck is very supple. Breasts:: Not examined CHEST WALL: No tenderness CHEST: Normal respiration, clear to auscultation bilaterally.  HEART: Regular rate and rhythm.  There are no murmur, rub, or gallops.   BACK: No kyphosis or scoliosis; no CVA tenderness ABDOMEN: soft and non-tender; no masses, no organomegaly, normal abdominal bowel sounds; no pannus; no intertriginous candida. There is no rebound and no distention. Rectal Exam: Not done EXTREMITIES: No bone or joint deformity; age-appropriate arthropathy of the hands and knees; no edema; no ulcerations.  There is no calf tenderness. Genitalia: not examined PULSES: 2+ and symmetric SKIN: Normal hydration no rash or ulceration CNS:  Cranial nerves 2-12 grossly intact no focal lateralizing neurologic deficit.  Speech is fluent; uvula elevated with phonation, facial symmetry and tongue midline. DTR are normal bilaterally, cerebella exam is intact, barbinski is negative and strengths are equaled bilaterally.  No sensory loss.   Labs on Admission:  Basic Metabolic Panel:  Recent Labs Lab 07/02/15 2230 07/02/15 2237 07/04/15 0621  NA  --  140 142  K  --  3.4* 4.3  CL  --  107 109  CO2  --  24 25  GLUCOSE  --  103* 89  BUN  --  10 <5*  CREATININE  --  0.70 0.39*  CALCIUM  --  8.9 8.6*  MG  --   --  1.6*  PHOS 3.4  --   --    Liver Function Tests:  Recent Labs Lab 07/02/15 2237 07/04/15 0621  AST 25 24  ALT 15 13*  ALKPHOS 120 99  BILITOT 0.4 0.3  PROT 6.8 5.8*  ALBUMIN 3.8 3.2*    Recent Labs Lab 07/02/15 2237  LIPASE 27   CBC:  Recent Labs Lab 07/02/15 2237  WBC 5.0  NEUTROABS 2.0  HGB 10.7*  HCT 32.9*  MCV 79.9  PLT 374   Cardiac Enzymes: No results for input(s): CKTOTAL, CKMB, CKMBINDEX, TROPONINI in the last 168 hours.  CBG:  Recent Labs Lab 07/03/15 1223 07/04/15 0759  GLUCAP 90 80     Radiological Exams on Admission: Ct Abdomen Pelvis W Contrast  07/03/2015  CLINICAL DATA:  Acute on chronic abdominal pain with nausea and vomiting EXAM: CT ABDOMEN AND PELVIS WITH CONTRAST TECHNIQUE: Multidetector CT imaging of the abdomen and pelvis was performed using the standard protocol following bolus administration of intravenous contrast. CONTRAST:  16mL ISOVUE-300 IOPAMIDOL (ISOVUE-300) INJECTION 61% COMPARISON:  05/28/2015 FINDINGS: Lung bases are free of acute infiltrate or sizable effusion. The liver is diffusely fatty infiltrated. The gallbladder has been surgically removed. Prominence of the biliary tree is noted related to the post cholecystectomy state. The spleen, adrenal glands and pancreas are within normal limits with the exception of some mild dilatation of the central  pancreatic duct. This has increased slightly in the interval from the prior exam. The kidneys demonstrate cystic change on the left. No renal calculi or obstructive changes are seen. The bladder is again well distended. No pelvic mass lesion is seen. The uterus has been surgically removed. The appendix has also been surgically removed. Left hip prosthesis is noted. Stable compression deformity of T12 is noted with increase kyphosis. Changes of prior vertebral augmentation at L3 and L5 are noted. No acute bony abnormality is noted IMPRESSION: Fatty liver. Prominence of the common bile duct and pancreatic duct. The pancreatic duct prominence has increased slightly in the interval from the prior exam. This is of uncertain significance. Chronic changes as described above no acute abnormality noted. Electronically Signed   By: Inez Catalina M.D.   On: 07/03/2015 12:20    Assessment/Plan Present on Admission:  . Intractable abdominal pain . Anemia . Epigastric pain . Nausea and vomiting . Hypokalemia . PUD (peptic ulcer disease)  PLAN:  Recurrent abdominal pain with intractable nausea and vomiting of unclear etiology, with hx of PUD and hx of anxiety and depression:   Will continue with IVF, and clear liquid as tolerated.  She would benefit getting re-scoped, but will defer to GI.  Will give IV PPI, and start low dose Elavil in case she has functional bowel syndrome.  Will continue with IVF>   Her labs and her exam are benign.   Other plans as per orders. Code Status: FULL CODE>   Orvan Falconer, MD.  FACP [facility] Pager [phone] 7pm to 7am.  07/04/2015, 5:11 PM

## 2015-07-05 ENCOUNTER — Telehealth: Payer: Self-pay | Admitting: Gastroenterology

## 2015-07-05 LAB — COMPREHENSIVE METABOLIC PANEL
ALBUMIN: 3.3 g/dL — AB (ref 3.5–5.0)
ALT: 13 U/L — ABNORMAL LOW (ref 14–54)
AST: 27 U/L (ref 15–41)
Alkaline Phosphatase: 106 U/L (ref 38–126)
Anion gap: 7 (ref 5–15)
BILIRUBIN TOTAL: 0.4 mg/dL (ref 0.3–1.2)
CO2: 27 mmol/L (ref 22–32)
Calcium: 8.5 mg/dL — ABNORMAL LOW (ref 8.9–10.3)
Chloride: 106 mmol/L (ref 101–111)
Creatinine, Ser: 0.48 mg/dL (ref 0.44–1.00)
Glucose, Bld: 111 mg/dL — ABNORMAL HIGH (ref 65–99)
POTASSIUM: 3.7 mmol/L (ref 3.5–5.1)
SODIUM: 140 mmol/L (ref 135–145)
TOTAL PROTEIN: 6 g/dL — AB (ref 6.5–8.1)

## 2015-07-05 LAB — URINE CULTURE

## 2015-07-05 LAB — GLUCOSE, CAPILLARY: GLUCOSE-CAPILLARY: 83 mg/dL (ref 65–99)

## 2015-07-05 MED ORDER — OXYCODONE-ACETAMINOPHEN 5-325 MG PO TABS
1.0000 | ORAL_TABLET | ORAL | Status: DC | PRN
  Administered 2015-07-05: 1 via ORAL
  Filled 2015-07-05: qty 1

## 2015-07-05 MED ORDER — OXYCODONE-ACETAMINOPHEN 5-325 MG PO TABS: 1.0000 | ORAL_TABLET | ORAL | Status: DC | PRN

## 2015-07-05 MED ORDER — PANTOPRAZOLE SODIUM 40 MG PO TBEC: 40.0000 mg | DELAYED_RELEASE_TABLET | Freq: Two times a day (BID) | ORAL | Status: DC

## 2015-07-05 MED ORDER — AMITRIPTYLINE HCL 10 MG PO TABS: 10.0000 mg | ORAL_TABLET | Freq: Every day | ORAL | Status: DC

## 2015-07-05 NOTE — Progress Notes (Signed)
Patient ID: Jordan Haney, female   DOB: 07/09/1955, 60 y.o.   MRN: [id number]   Assessment/Plan: ADMITTED WITH FLARE OF NAUSEA/VOMITING/ADBOMINAL PAIN-ETIOLOGY UNCLEAR. CLINICALLY IMPROVED.  PLAN: 1. MRCP NEXT WEEK 2. OUTPATIENT SURGERY EVAL 3. FULL LIQUID DIET AND ADVANCE AS TOLERATED. SHAKES AND OR CIB 5 TIMES A DAY. 4. CONTINUE REGLAN. AGREE WITH ELAVIL. WILL TITRATE AS AN OUTPATIENT. 5. OPV IN 6 WEEKS   Subjective: Since I last evaluated the patient HER ABDOMINAL PAIN IS THE SAME. NAUSEA BETTER. NO VOMITING. ELAVIL ADDED LAST NIGHT.  Objective: Vital signs in last 24 hours: Filed Vitals:   07/04/15 2043 07/05/15 0623  BP: 141/88 151/98  Pulse: 86 71  Temp: 98.7 F (37.1 C) 98.3 F (36.8 C)  Resp: 20 20   General appearance: alert, cooperative and no distress Resp: clear to auscultation bilaterally Cardio: regular rate and rhythm GI: soft, MILDLY tender IN EPIGASTRIUM; bowel sounds normal;   Lab Results:  STOOL PANEL NEG ALB 3.3    Studies/Results: No results found.  Medications: I have reviewed the patient's current medications.   LOS: 5 days   Barney Drain 08/30/2013, 2:23 PM

## 2015-07-05 NOTE — Progress Notes (Signed)
Discharge instructions read to patient.  Patient verbalized understanding of all instructions. Discharged to home with family. 

## 2015-07-05 NOTE — Telephone Encounter (Signed)
FULL LIQUID DIET AND ADVANCE AS TOLERATED. SHAKES AND OR CIB 5 TIMES A DAY. WILL TITRATE ELAVIL AS AN OUTPATIENT OPV IN 6 WEEKS E30 ABDOMINAL PAIN, NAUSEA/VOMITING

## 2015-07-05 NOTE — Telephone Encounter (Signed)
1. 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 WILL SCHEDULE OPEN MRCP IN [place] AS AN OUTPATIENT, Dx: PANCREATITIS/ABDOMINAL PAIN. SCHEDULE FOR APR 20 OR 21. 2. SCHEDULE SURGERY EVALUATION AFTER MRCP COMPLETE WITH DR. Arnoldo Morale TO CONSIDER AN EX LAP AS AN OUTPATIENT FOR ABDOMINAL PAIN.

## 2015-07-05 NOTE — Discharge Summary (Signed)
Physician Discharge Summary  Jordan Haney [id number] DOB: 08-05-1955 DOA: 07/02/2015  PCP: [name]  Admit date: 07/02/2015 Discharge date: 07/05/2015  Time spent: 35 minutes  Recommendations for Outpatient Follow-up:  1. Follow up with Dr Oneida Alar next week.  Dr Eden Lathe will schedule open MRI of the abdomen.  2. Follow up with Dr Arnoldo Morale in consideration for exploratory laparotomy.  3. Follow up with your PCP as scheduled.    Discharge Diagnoses:  Principal Problem:   Intractable abdominal pain Active Problems:   Nausea and vomiting   Hypokalemia   PUD (peptic ulcer disease)   Epigastric pain   Anemia   Malnutrition of moderate degree   Discharge Condition: improve.  Able to tolerate food with no abdominal pain.   Diet recommendation: Bland diet and advance as tolerated.   Filed Weights   07/02/15 2213 07/05/15 0245  Weight: 49.896 kg (110 lb) 48.399 kg (106 lb 11.2 oz)    History of present illness:  Patient was admitted by Dr Myna Hidalgo for epigastric pain, intractable nausea and vomiting of unclear etiology on July 03, 2015.  As per his H and P:  " Jordan Haney is a 60 y.o. female with PMH of hypertension, chronic constipation, depression, anxiety, and peptic ulcer disease who presents to the ED with acute on chronic epigastric pain with nausea and vomiting. Patient reports that her symptoms have been constant for the past 2 weeks. She reports at that time to worsen yesterday. She describes her epigastric pain has severe, sharp, localized to the epigastrium, worse with meals, and better with remaining still. She has had the same symptoms intermittently for several months. She was admitted to this institution from 05/27/2015 to 06/02/2015 under very similar circumstances. She underwent abdominal CT at that time which was notable for constipation and urinary retention, but no other acute findings were identified. She was again admitted to this institution from 3/19  to 06/17/2015 under the same circumstances. Upper endoscopy was performed during that admission and notable for a Roux-en-Y anastomosis with healthy-appearing tissue; study was normal. She reports seeing her primary care physician for these complaints, states that she was diagnosed with pancreatitis and directed to the emergency department.  In ED, patient was found to be afebrile, saturating well on room air, and with vital signs stable. CMP features a mild hypokalemia but is otherwise unremarkable. CBC features a hemoglobin of 10.7 which is stable relative to prior measurements. Lactic acid is reassuring 0.7. Lipase level returned normal at 27. Patient was given a 1 L normal saline bolus in the emergency department and continued on normal saline infusion. She she was treated with Zofran, Reglan, Protonix, and multiple doses of Dilaudid IV. Patient's pain remained uncontrolled and the hospitalists were asked to admit for intractable abdominal pain.   Hospital Course:  [name] is an 60 y.o. female with hx of chronic intermittent abdominal pain, requiring several admission for same, hx of HTN, anxiety, chronic constipation, admitted for epigastric pain, nausea and vomiting. Her serology was not revealing and neither was her abd/Pelvic CT. She did have duodenitis and gastritis, with outlet stricture in the past when EGD was performed by Dr Oneida Alar. It was dilated previously. 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  Dr Oneida Alar did not think repeat EGD was helpful, but she recommended open MRI of the abdomen, as patient is claustrophobic.  She also recommended to see Dr Arnoldo Morale in follow up if pain  persists for exploratory laparotomy.  Her Stool studies were negative.  There may be stress and anxiety aggravating her symptoms, as her exam, labs and imagings were not remarkable, and her symptoms changes quickly.  With this in mind, she was started on Elavil 10mg  per day, and  it seems to have helped some.  She requested Percocet, and was given 15 tablets.  She is stable for discharge today, and will be discharged with follow up as noted.  Thank you and Good Day.   Discharge Exam: Filed Vitals:   07/04/15 2043 07/05/15 0623  BP: 141/88 151/98  Pulse: 86 71  Temp: 98.7 F (37.1 C) 98.3 F (36.8 C)  Resp: 20 20    Discharge Instructions    Current Discharge Medication List    START taking these medications   Details  amitriptyline (ELAVIL) 10 MG tablet Take 1 tablet (10 mg total) by mouth at bedtime. Qty: 30 tablet, Refills: 1    oxyCODONE-acetaminophen (PERCOCET/ROXICET) 5-325 MG tablet Take 1 tablet by mouth every 4 (four) hours as needed for moderate pain or severe pain. Qty: 15 tablet, Refills: 0      CONTINUE these medications which have NOT CHANGED   Details  ALPRAZolam (XANAX) 0.5 MG tablet Take 0.5 mg by mouth 2 (two) times daily.    DULoxetine (CYMBALTA) 30 MG capsule Take 30 mg by mouth daily.     feeding supplement (BOOST / RESOURCE BREEZE) LIQD Take 1 Container by mouth 2 (two) times daily between meals. Qty: 60 Container, Refills: 0    lisinopril (PRINIVIL,ZESTRIL) 20 MG tablet Take 20 mg by mouth daily.    ondansetron (ZOFRAN ODT) 4 MG disintegrating tablet Take 1 tablet (4 mg total) by mouth every 6 (six) hours as needed for nausea or vomiting. Qty: 30 tablet, Refills: 1    pantoprazole (PROTONIX) 40 MG tablet Take 1 tablet (40 mg total) by mouth 2 (two) times daily. Qty: 60 tablet, Refills: 1    senna (SENOKOT) 8.6 MG TABS tablet Take 2 tablets (17.2 mg total) by mouth 2 (two) times daily. Qty: 120 each, Refills: 0    topiramate (TOPAMAX) 50 MG tablet Take 50 mg by mouth daily.       STOP taking these medications     acetaminophen (TYLENOL) 500 MG tablet      calcium gluconate 500 MG tablet      calcium-vitamin D (CALCIUM 500/D) 500-200 MG-UNIT tablet      metoCLOPramide (REGLAN) 10 MG tablet      oxyCODONE (OXY  IR/ROXICODONE) 5 MG immediate release tablet      polyethylene glycol (MIRALAX / GLYCOLAX) packet      Vitamin D, Ergocalciferol, (DRISDOL) 50000 units CAPS capsule      zolpidem (AMBIEN) 5 MG tablet      PARoxetine (PAXIL) 40 MG tablet        Allergies  Allergen Reactions  . Compazine Anaphylaxis  . Phenergan [Promethazine] Anaphylaxis  . Vistaril [Hydroxyzine Hcl] Other (See Comments)    Reaction: legs shake  . Benadryl [Diphenhydramine Hcl] Other (See Comments)    Pt states, "it makes my legs shake really bad"   . Gabapentin Other (See Comments)    Reactions: legs shake  . Latex Swelling      The results of significant diagnostics from this hospitalization (including imaging, microbiology, ancillary and laboratory) are listed below for reference.    Significant Diagnostic Studies: Ct Abdomen Pelvis W Contrast  07/03/2015  CLINICAL DATA:  Acute on chronic  abdominal pain with nausea and vomiting EXAM: CT ABDOMEN AND PELVIS WITH CONTRAST TECHNIQUE: Multidetector CT imaging of the abdomen and pelvis was performed using the standard protocol following bolus administration of intravenous contrast. CONTRAST:  154mL ISOVUE-300 IOPAMIDOL (ISOVUE-300) INJECTION 61% COMPARISON:  05/28/2015 FINDINGS: Lung bases are free of acute infiltrate or sizable effusion. The liver is diffusely fatty infiltrated. The gallbladder has been surgically removed. Prominence of the biliary tree is noted related to the post cholecystectomy state. The spleen, adrenal glands and pancreas are within normal limits with the exception of some mild dilatation of the central pancreatic duct. This has increased slightly in the interval from the prior exam. The kidneys demonstrate cystic change on the left. No renal calculi or obstructive changes are seen. The bladder is again well distended. No pelvic mass lesion is seen. The uterus has been surgically removed. The appendix has also been surgically removed. Left hip  prosthesis is noted. Stable compression deformity of T12 is noted with increase kyphosis. Changes of prior vertebral augmentation at L3 and L5 are noted. No acute bony abnormality is noted IMPRESSION: Fatty liver. Prominence of the common bile duct and pancreatic duct. The pancreatic duct prominence has increased slightly in the interval from the prior exam. This is of uncertain significance. Chronic changes as described above no acute abnormality noted. Electronically Signed   By: Inez Catalina M.D.   On: 07/03/2015 12:20   Dg Ugi W/small Bowel High Density  06/10/2015  CLINICAL DATA:  60 year old female with epigastric pain common nausea and vomiting for the past 3 days. 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 Additional history of small-bowel obstruction status post partial small bowel resection 2 years ago. EXAM: UPPER GI SERIES WITH SMALL BOWEL FOLLOW-THROUGH FLUOROSCOPY TIME:  If the device does not provide the exposure index: Fluoroscopy Time (in minutes and seconds):  2 minutes and 54 seconds Number of Acquired Images:  28 TECHNIQUE: Combined double contrast and single contrast upper GI series using effervescent crystals, thick barium, and thin barium. Subsequently, serial images of the small bowel were obtained including spot views of the terminal ileum. COMPARISON:  No priors. FINDINGS: Preprocedure KUB demonstrated gas and stool throughout the colon extending to the distal rectum. No pathologic dilatation of small bowel. Suture line in the left side of the abdomen. Numerous surgical clips in the right upper quadrant from prior cholecystectomy. Surgical clip projecting over the right side of the pelvis. Postprocedural changes of vertebroplasty at L3 and L5. Double contrast images of the esophagus demonstrated a normal appearance of the esophageal mucosa. No esophageal mass, stricture or esophageal ring. No hiatal hernia. Esophageal motility was normal. Double contrast  images of the stomach are limited secondary to lack of patient mobility, and inability to adequately coat the entire stomach. Postoperative changes of prior Roux-en-Y gastrectomy are noted. Throughout the remaining portions of the stomach there were several areas of subtle nodularity, that could represent small gastric polyps (although some of these findings may be attributable to small gas bubbles in poorly mixed and poorly distributed barium). No definite gastric mass or ulcer was identified. Remaining portions of small bowel were normal in appearance, without focal mucosal abnormality, definite stricture or definite mass. No pathologic dilatation of small bowel. Terminal ileum was normal in appearance. IMPRESSION: 1. No evidence of recurrent bowel obstruction. 2. No acute findings to account for the patient's symptoms. 3. Multiple minor mucosal abnormalities in the stomach that may suggest the presence of multiple small  hyperplastic polyps. 4. Normal appearance of the esophagus. Electronically Signed   By: Vinnie Langton M.D.   On: 06/10/2015 14:16    Microbiology: Recent Results (from the past 240 hour(s))  Urine culture     Status: None (Preliminary result)   Collection Time: 07/03/15  2:25 PM  Result Value Ref Range Status   Specimen Description URINE, CLEAN CATCH  Final   Special Requests NONE  Final   Culture   Final    TOO YOUNG TO READ Performed at [facility]    Report Status PENDING  Incomplete  C difficile quick scan w PCR reflex     Status: None   Collection Time: 07/03/15  2:26 PM  Result Value Ref Range Status   C Diff antigen NEGATIVE NEGATIVE Final   C Diff toxin NEGATIVE NEGATIVE Final   C Diff interpretation Negative for toxigenic C. difficile  Final  Gastrointestinal Panel by PCR , Stool     Status: None   Collection Time: 07/03/15  2:26 PM  Result Value Ref Range Status   Campylobacter species NOT DETECTED NOT DETECTED Final   Plesimonas shigelloides NOT  DETECTED NOT DETECTED Final   Salmonella species NOT DETECTED NOT DETECTED Final   Yersinia enterocolitica NOT DETECTED NOT DETECTED Final   Vibrio species NOT DETECTED NOT DETECTED Final   Vibrio cholerae NOT DETECTED NOT DETECTED Final   Enteroaggregative E coli (EAEC) NOT DETECTED NOT DETECTED Final   Enteropathogenic E coli (EPEC) NOT DETECTED NOT DETECTED Final   Enterotoxigenic E coli (ETEC) NOT DETECTED NOT DETECTED Final   Shiga like toxin producing E coli (STEC) NOT DETECTED NOT DETECTED Final   E. coli O157 NOT DETECTED NOT DETECTED Final   Shigella/Enteroinvasive E coli (EIEC) NOT DETECTED NOT DETECTED Final   Cryptosporidium NOT DETECTED NOT DETECTED Final   Cyclospora cayetanensis NOT DETECTED NOT DETECTED Final   Entamoeba histolytica NOT DETECTED NOT DETECTED Final   Giardia lamblia NOT DETECTED NOT DETECTED Final   Adenovirus F40/41 NOT DETECTED NOT DETECTED Final   Astrovirus NOT DETECTED NOT DETECTED Final   Norovirus GI/GII NOT DETECTED NOT DETECTED Final   Rotavirus A NOT DETECTED NOT DETECTED Final   Sapovirus (I, II, IV, and V) NOT DETECTED NOT DETECTED Final     Labs: Basic Metabolic Panel:  Recent Labs Lab 07/02/15 2230 07/02/15 2237 07/04/15 0621 07/05/15 0607  NA  --  140 142 140  K  --  3.4* 4.3 3.7  CL  --  107 109 106  CO2  --  24 25 27   GLUCOSE  --  103* 89 111*  BUN  --  10 <5* <5*  CREATININE  --  0.70 0.39* 0.48  CALCIUM  --  8.9 8.6* 8.5*  MG  --   --  1.6*  --   PHOS 3.4  --   --   --    Liver Function Tests:  Recent Labs Lab 07/02/15 2237 07/04/15 0621 07/05/15 0607  AST 25 24 27   ALT 15 13* 13*  ALKPHOS 120 99 106  BILITOT 0.4 0.3 0.4  PROT 6.8 5.8* 6.0*  ALBUMIN 3.8 3.2* 3.3*    Recent Labs Lab 07/02/15 2237  LIPASE 27   CBC:  Recent Labs Lab 07/02/15 2237  WBC 5.0  NEUTROABS 2.0  HGB 10.7*  HCT 32.9*  MCV 79.9  PLT 374   CBG:  Recent Labs Lab 07/03/15 1223 07/04/15 0759 07/05/15 0746  GLUCAP 90 80  83  SignedOrvan Falconer MD.  [facility] 07/05/2015, 1:05 PM

## 2015-07-05 NOTE — Progress Notes (Signed)
Patient ID: ROSARIE MATUSKA, female   DOB: Dec 31, 1955, 60 y.o.   MRN: [id number]   Assessment/Plan: ADMITTED WITH EXACERBATION OF ACUTE ON CHRONIC NAUSEA/VOMITING/ABDOMINALPAIN. CLINICALLY IMPROVED. EXTENSIVE GI WORKUP THUS FAR HAS NOT REVEALED ETIOLOGY FOR SYMPTOMS.  PT SEEN AND EVALUATED BY [facility] AND WE AGREE IT MAY BE DUE TO ADHESIONS OR AN INTERNAL HERNIA, LESS LIKELY RELATED TO A BILIARY OR PANCREATIC SOURCE. PT'S COMPLAINTS ARE OUT OF PROPORTION TO HER EXAM/IMAGING. EXPLAINED TO PT WE HAVE LIMITED OPTIONS FOR MANAGEMENT OF HER SYMPTOMS AT THIS POINT & WE HAVE EXHAUSTED OUR WORKUP AT [facility].  PLAN: 1. 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 WILL SCHEDULE OPEN MRCP IN [place] AS AN OUTPATIENT. 2. WILL SCHEDULE SURGERY EVALUATION TO CONSIDER AN EX LAP AS AN OUTPATIENT. 3. SUPPORTIVE CARE. 4. ADVANCE DIET AS TOLERATED. 5. CONTINUE REGLAN AND ZOFRAN 6. NO INDICATION FOR ENDOSCOPY AT THIS TIME.  GREATER THAN 50% WAS SPENT IN COUNSELING & COORDINATION OF CARE WITH THE PATIENT: DISCUSSED DIFFERENTIAL DIAGNOSIS, PROCEDURE, BENEFITS, AND MANAGEMENT OF CHRONIC ABDOMINAL PAIN AFTER SURGERY WITH A UNREMARKABKE/EXTENSIVE WORKUP. ENCOUNTER TIME: 40 MINS.    Subjective: Since last evaluated the patient, sml amount of vomiting this am. HAS SEVERE NAUSEA. PAIN IS SAME. TOLERATING POs. SHE HAD 2 SML WATERY STOOLS THIS AM.  Objective: Vital signs in last 24 hours: Filed Vitals:   07/04/15 2043 07/05/15 0623  BP: 141/88 151/98  Pulse: 86 71  Temp: 98.7 F (37.1 C) 98.3 F (36.8 C)  Resp: 20 20     General appearance: alert, cooperative and no distress Resp: clear to auscultation bilaterally Cardio: regular rate and rhythm GI: soft, non-tender; bowel sounds normal; no masses,  no organomegaly Extremities: extremities normal, atraumatic, no cyanosis or edema  Lab Results:  Cr 0.39, C DIFF PCR NEG    Studies/Results: No results found.  Medications: I have reviewed the patient's current  medications.   LOS: 5 days   Barney Drain 08/30/2013, 2:23 PM

## 2015-07-07 ENCOUNTER — Encounter: Payer: Self-pay | Admitting: Gastroenterology

## 2015-07-07 ENCOUNTER — Encounter (HOSPITAL_COMMUNITY): Payer: Self-pay | Admitting: Emergency Medicine

## 2015-07-07 ENCOUNTER — Emergency Department (HOSPITAL_COMMUNITY)

## 2015-07-07 ENCOUNTER — Other Ambulatory Visit: Payer: Self-pay

## 2015-07-07 ENCOUNTER — Telehealth: Payer: Self-pay | Admitting: Gastroenterology

## 2015-07-07 ENCOUNTER — Observation Stay (HOSPITAL_COMMUNITY)
Admission: EM | Admit: 2015-07-07 | Discharge: 2015-07-09 | Disposition: A | Discharge status: Home / Self Care | Attending: Internal Medicine | Admitting: Internal Medicine

## 2015-07-07 DIAGNOSIS — R112 Nausea with vomiting, unspecified: Secondary | ICD-10-CM | POA: Diagnosis not present

## 2015-07-07 DIAGNOSIS — K279 Peptic ulcer, site unspecified, unspecified as acute or chronic, without hemorrhage or perforation: Secondary | ICD-10-CM | POA: Diagnosis present

## 2015-07-07 DIAGNOSIS — R109 Unspecified abdominal pain: Secondary | ICD-10-CM | POA: Diagnosis present

## 2015-07-07 DIAGNOSIS — Z79899 Other long term (current) drug therapy: Secondary | ICD-10-CM | POA: Insufficient documentation

## 2015-07-07 DIAGNOSIS — K315 Obstruction of duodenum: Secondary | ICD-10-CM | POA: Diagnosis present

## 2015-07-07 DIAGNOSIS — M069 Rheumatoid arthritis, unspecified: Secondary | ICD-10-CM | POA: Diagnosis not present

## 2015-07-07 DIAGNOSIS — R1013 Epigastric pain: Secondary | ICD-10-CM | POA: Diagnosis not present

## 2015-07-07 DIAGNOSIS — K859 Acute pancreatitis, unspecified: Secondary | ICD-10-CM

## 2015-07-07 DIAGNOSIS — I10 Essential (primary) hypertension: Secondary | ICD-10-CM | POA: Diagnosis not present

## 2015-07-07 DIAGNOSIS — K760 Fatty (change of) liver, not elsewhere classified: Secondary | ICD-10-CM | POA: Diagnosis present

## 2015-07-07 DIAGNOSIS — K8689 Other specified diseases of pancreas: Secondary | ICD-10-CM | POA: Diagnosis present

## 2015-07-07 LAB — CBC WITH DIFFERENTIAL/PLATELET
BASOS ABS: 0 10*3/uL (ref 0.0–0.1)
Basophils Relative: 1 %
Eosinophils Absolute: 0 10*3/uL (ref 0.0–0.7)
Eosinophils Relative: 0 %
HEMATOCRIT: 36.5 % (ref 36.0–46.0)
Hemoglobin: 11.6 g/dL — ABNORMAL LOW (ref 12.0–15.0)
Lymphocytes Relative: 21 %
Lymphs Abs: 1.6 10*3/uL (ref 0.7–4.0)
MCH: 25.5 pg — ABNORMAL LOW (ref 26.0–34.0)
MCHC: 31.8 g/dL (ref 30.0–36.0)
MCV: 80.2 fL (ref 78.0–100.0)
MONO ABS: 0.3 10*3/uL (ref 0.1–1.0)
MONOS PCT: 4 %
NEUTROS ABS: 5.4 10*3/uL (ref 1.7–7.7)
Neutrophils Relative %: 74 %
Platelets: 395 10*3/uL (ref 150–400)
RBC: 4.55 MIL/uL (ref 3.87–5.11)
RDW: 14.8 % (ref 11.5–15.5)
WBC: 7.3 10*3/uL (ref 4.0–10.5)

## 2015-07-07 LAB — URINALYSIS, ROUTINE W REFLEX MICROSCOPIC
Bilirubin Urine: NEGATIVE
Glucose, UA: NEGATIVE mg/dL
Hgb urine dipstick: NEGATIVE
Ketones, ur: NEGATIVE mg/dL
Nitrite: NEGATIVE
PROTEIN: NEGATIVE mg/dL
SPECIFIC GRAVITY, URINE: 1.01 (ref 1.005–1.030)
pH: 6 (ref 5.0–8.0)

## 2015-07-07 LAB — COMPREHENSIVE METABOLIC PANEL
ALT: 16 U/L (ref 14–54)
ANION GAP: 12 (ref 5–15)
AST: 34 U/L (ref 15–41)
Albumin: 4.4 g/dL (ref 3.5–5.0)
Alkaline Phosphatase: 142 U/L — ABNORMAL HIGH (ref 38–126)
BUN: 5 mg/dL — ABNORMAL LOW (ref 6–20)
CHLORIDE: 101 mmol/L (ref 101–111)
CO2: 22 mmol/L (ref 22–32)
CREATININE: 0.53 mg/dL (ref 0.44–1.00)
Calcium: 9.2 mg/dL (ref 8.9–10.3)
GFR calc non Af Amer: >60 mL/min (ref >60)
GLUCOSE: 144 mg/dL — AB (ref 65–99)
Potassium: 3.3 mmol/L — ABNORMAL LOW (ref 3.5–5.1)
Sodium: 135 mmol/L (ref 135–145)
Total Bilirubin: 0.2 mg/dL — ABNORMAL LOW (ref 0.3–1.2)
Total Protein: 8 g/dL (ref 6.5–8.1)

## 2015-07-07 LAB — LIPASE, BLOOD: Lipase: 23 U/L (ref 11–51)

## 2015-07-07 LAB — URINE MICROSCOPIC-ADD ON: RBC / HPF: NONE SEEN RBC/hpf (ref 0–5)

## 2015-07-07 LAB — I-STAT CG4 LACTIC ACID, ED
LACTIC ACID, VENOUS: 3.63 mmol/L — AB (ref 0.5–2.0)
Lactic Acid, Venous: 1.44 mmol/L (ref 0.5–2.0)

## 2015-07-07 MED ORDER — SODIUM CHLORIDE 0.9 % IV BOLUS (SEPSIS)
1000.0000 mL | Freq: Once | INTRAVENOUS | Status: AC
Start: 2015-07-07 — End: 2015-07-07
  Administered 2015-07-07: 1000 mL via INTRAVENOUS

## 2015-07-07 MED ORDER — ONDANSETRON HCL 4 MG/2ML IJ SOLN
4.0000 mg | Freq: Once | INTRAMUSCULAR | Status: AC
  Administered 2015-07-07: 4 mg via INTRAVENOUS
  Filled 2015-07-07: qty 2

## 2015-07-07 MED ORDER — SODIUM CHLORIDE 0.9 % IV SOLN: INTRAVENOUS | Status: DC

## 2015-07-07 MED ORDER — HYDROMORPHONE HCL 1 MG/ML IJ SOLN
1.0000 mg | Freq: Once | INTRAMUSCULAR | Status: AC
  Administered 2015-07-07: 1 mg via INTRAVENOUS
  Filled 2015-07-07: qty 1

## 2015-07-07 MED ORDER — IOPAMIDOL (ISOVUE-300) INJECTION 61%
100.0000 mL | Freq: Once | INTRAVENOUS | Status: AC | PRN
  Administered 2015-07-07: 100 mL via INTRAVENOUS

## 2015-07-07 MED ORDER — SODIUM CHLORIDE 0.9 % IV BOLUS (SEPSIS)
1000.0000 mL | Freq: Once | INTRAVENOUS | Status: AC
  Administered 2015-07-07: 1000 mL via INTRAVENOUS

## 2015-07-07 MED ORDER — DIATRIZOATE MEGLUMINE & SODIUM 66-10 % PO SOLN
ORAL | Status: AC
  Filled 2015-07-07: qty 30

## 2015-07-07 NOTE — Telephone Encounter (Signed)
Referral sent to [facility]

## 2015-07-07 NOTE — Telephone Encounter (Signed)
[phone]    PLEASE CALL PATIENT, VOMITING ABD PAIN IS BACK.  [facility] WAS GOING TO GET PT A REFERRAL TO SEE DR Arnoldo Morale

## 2015-07-07 NOTE — Telephone Encounter (Signed)
Referral sent to Dr. Arnoldo Morale clinicals faxed

## 2015-07-07 NOTE — ED Notes (Signed)
Having sharp abdominal pain since last night.  Rates pain 9/10.

## 2015-07-07 NOTE — Telephone Encounter (Signed)
PT also said that Dr. Oneida Alar was going to refer her to Dr. Arnoldo Morale.

## 2015-07-07 NOTE — Telephone Encounter (Signed)
Pt said her abdominal pain in her pancreas area has been hurting very badly since 8:30 Am this morning. She rates the pain at a 9 and said she has vomited 3 times also this morning.  She sound like she was crying and I told her to go to the ED if her pain was that bad and she said that she would.  Routing to Dr. Oneida Alar ( who is off today as an FYI ). Also, sending FYI to Laban Emperor, NP .

## 2015-07-07 NOTE — Telephone Encounter (Signed)
Sent fax to [facility] for open MRI.

## 2015-07-07 NOTE — Telephone Encounter (Signed)
Agree 

## 2015-07-07 NOTE — ED Provider Notes (Signed)
CSN: [id number]     Arrival date & time 07/07/15  1333 History   First MD Initiated Contact with Patient 07/07/15 1615     Chief Complaint  Patient presents with  . Abdominal Pain     (Consider location/radiation/quality/duration/timing/severity/associated sxs/prior Treatment) HPI Comments: Patient presents with central epigastric pain that onset yesterday. Associated with nausea and vomiting. Pain radiates to the back. This is exactly the same as pain that she has been dealing with for the past 1 month. She was discharged from the hospital 2 days ago after hospitalization for similar pain. States unable to tolerate her medications at home today. Has had multiple episodes of nausea and vomiting that are nonbloody. Cannot keep her medications down. No fever, chills, chest pain or shortness of breath. No diaphoresis. She was told that her pain was due to chronic pancreatitis and she is being scheduled for an outpatient MRI and MRCP by her gastroenterologist Dr. Oneida Alar. She was also referred to the general surgeon for possible exploratory laparotomy. She reports the pain she is having today is exactly the same as what she's had before.  Patient is a 60 y.o. female presenting with abdominal pain. The history is provided by the patient.  Abdominal Pain Associated symptoms: nausea and vomiting   Associated symptoms: no chest pain, no dysuria, no fever, no hematuria, no shortness of breath, no vaginal bleeding and no vaginal discharge     Past Medical History  Diagnosis Date  . RA (rheumatoid arthritis) ([place])  2008  . Constipation 03/21/2011  . Hypokalemia 03/21/2011  . Fatty liver 03/21/2011  . Pancreatitis     states in remote past, very severe, ?biliary pancreatitis   . T12 compression fracture ([place]) 2011  . Duodenal ulcer     remote per patient. +BC powders, patient report negative EGD six months ago  . Dilated pancreatic duct ([place])     ?chronic pancreatitis   . Kidney stones   . Chronic  abdominal pain   . Pneumonia     as child  . Anemia   . Lumbar stress fracture   . Anxiety   . Essential hypertension 06/09/2015   Past Surgical History  Procedure Laterality Date  . Cholecystectomy    . Knee surgery    . Appendectomy    . Complete hysterectomy    . Ventral wall hernia    . Esophagogastroduodenoscopy  08/2010    Dr. Earley Brooke, [place]. small hh, mild prepylori gastritis. path unavailable  . Esophagogastroduodenoscopy  04/2010    Dr. Earley Brooke, [place]. small hh, mild distal esophagitis, antral gastritis and duodenitis. Bx mild chronic gastritis and no H.pylori  . Esophagogastroduodenoscopy  08/2009    Dr. Posey Pronto, [place]. Moderate gastritis, moderate duodenitis with nodularity in proximal duodenal bulb, bx chronic gastritis, no helicobacter, mild chronic duodenitis  . Esophagogastroduodenoscopy  02/2007    Dr. Earley Brooke, [place]. antral gastritis and duodenal ulcers. Bx mild chronic gastritis. No bx from duodenal ulcer available.  [name] dilation  04/06/2011    Procedure: SAVORY DILATION;  Surgeon: Dorothyann Peng, MD;  Location: [facility];  Service: Endoscopy;  Laterality: N/A;  16 mm  . Esophagogastroduodenoscopy  04/06/11    Dr. Oneida Alar: 1-2 cm hiatal hernia, mod gastritis, 59mmX3mm linear ulcer in duodenal bulb, stricture 1st part of duodenum, dilated to 12 mm  . Esophagogastroduodenoscopy  05/12/11    partial gastric oulet obstruction secondary to stricture between bulb/2nd portion of duodenum with marked friability and inflammation but no discrete ulcer, dilated stomach. s/p  dilation but high risk for restenosis  . Esophagogastroduodenoscopy  06/22/2011    [id number] ring/Moderate gastritis/PERSISTENT Ulcer in the bulb/descending duodenum Stricture in the bulb/descending duodenum  . Gastrojejunostomy  06/28/2011    Roux-en-Y with choledochoduodenostomy   . Abdominal hysterectomy    . Kidney stone surgery    . Esophagogastroduodenoscopy Left 06/25/2012    RMR: Normal esophagus.  Surgically altered anastomosis to  small intestine noted. Couple of  at the anastomosis.  Patent efferent limb Patient had an oozing 1 cm anastomotic ulcer  . Back surgery  2013  . Colonoscopy N/A 08/16/2012    Dr. Gala Romney: normal but inadequate prep. Consider early interval colonoscopy.   . Cardiac catheterization      2008  . Esophagogastroduodenoscopy Left 10/23/2012    Dr. Gala Romney: mild erosive reflux esophagitis, s/p hemigastrectomy, prior site of gastric ulcer healed   . Total hip arthroplasty Left 01/2015    in [place], [place]  . Total hip arthroplasty    . Kyphoplasty  Jan 2017    [place], [place]   . Exploratory laparotomy w/ bowel resection      2014, sometime in the summer   . Esophagogastroduodenoscopy N/A 06/11/2015    Procedure: ESOPHAGOGASTRODUODENOSCOPY (EGD);  Surgeon: Daneil Dolin, MD;  Location: [facility];  Service: Endoscopy;  Laterality: N/A;   Family History  Problem Relation Age of Onset  . Colon cancer Neg Hx   . Liver cancer Neg Hx   . Breast cancer Neg Hx   . Inflammatory bowel disease Neg Hx   . Heart attack Mother    Social History  Substance Use Topics  . Smoking status: Never Smoker   . Smokeless tobacco: Never Used  . Alcohol Use: No   OB History    No data available     Review of Systems  Constitutional: Positive for activity change and appetite change. Negative for fever.  HENT: Negative for congestion and rhinorrhea.   Eyes: Negative for visual disturbance.  Respiratory: Negative for chest tightness and shortness of breath.   Cardiovascular: Negative for chest pain.  Gastrointestinal: Positive for nausea, vomiting and abdominal pain.  Genitourinary: Negative for dysuria, hematuria, vaginal bleeding and vaginal discharge.  Musculoskeletal: Positive for back pain. Negative for myalgias and arthralgias.  Skin: Negative for rash.  Neurological: Negative for dizziness, weakness, light-headedness and headaches.  A complete 10 system review of systems  was obtained and all systems are negative except as noted in the HPI and PMH.      Allergies  Compazine; Phenergan; Vistaril; Benadryl; Gabapentin; and Latex  Home Medications   Prior to Admission medications   Medication Sig Start Date End Date Taking? Authorizing Provider  ALPRAZolam Duanne Moron) 0.5 MG tablet Take 0.5 mg by mouth 2 (two) times daily.   Yes Historical Provider, MD  amitriptyline (ELAVIL) 10 MG tablet Take 1 tablet (10 mg total) by mouth at bedtime. 07/05/15  Yes Orvan Falconer, MD  DULoxetine (CYMBALTA) 30 MG capsule Take 30 mg by mouth daily.  06/27/15  Yes Historical Provider, MD  feeding supplement (BOOST / RESOURCE BREEZE) LIQD Take 1 Container by mouth 2 (two) times daily between meals. 06/17/15  Yes Kathie Dike, MD  lisinopril (PRINIVIL,ZESTRIL) 20 MG tablet Take 20 mg by mouth daily.   Yes Historical Provider, MD  ondansetron (ZOFRAN ODT) 4 MG disintegrating tablet Take 1 tablet (4 mg total) by mouth every 6 (six) hours as needed for nausea or vomiting. 06/17/15  Yes Kathie Dike, MD  oxyCODONE-acetaminophen (PERCOCET/ROXICET)  5-325 MG tablet Take 1 tablet by mouth every 4 (four) hours as needed for moderate pain or severe pain. 07/05/15  Yes Orvan Falconer, MD  pantoprazole (PROTONIX) 40 MG tablet Take 1 tablet (40 mg total) by mouth 2 (two) times daily. 06/02/15  Yes Kathie Dike, MD  senna (SENOKOT) 8.6 MG TABS tablet Take 2 tablets (17.2 mg total) by mouth 2 (two) times daily. 06/02/15  Yes Kathie Dike, MD  topiramate (TOPAMAX) 50 MG tablet Take 50 mg by mouth daily.  06/27/15  Yes Historical Provider, MD   BP 189/92 mmHg  Pulse 80  Temp(Src) 99.1 F (37.3 C) (Oral)  Resp 16  Wt 110 lb (49.896 kg)  SpO2 100% Physical Exam  Constitutional: She is oriented to person, place, and time. She appears well-developed and well-nourished. No distress.  HENT:  Head: Normocephalic and atraumatic.  Mouth/Throat: Oropharynx is clear and moist. No oropharyngeal exudate.  Eyes:  Conjunctivae and EOM are normal. Pupils are equal, round, and reactive to light.  Neck: Normal range of motion. Neck supple.  No meningismus.  Cardiovascular: Normal rate, regular rhythm, normal heart sounds and intact distal pulses.   No murmur heard. Pulmonary/Chest: Effort normal and breath sounds normal. No respiratory distress.  Abdominal: Soft. There is tenderness. There is no rebound and no guarding.  Midline surgical incision well healed, moderate epigastric tenderness. There is no guarding or rebound. There is no right upper quadrant tenderness  Musculoskeletal: Normal range of motion. She exhibits no edema or tenderness.  Neurological: She is alert and oriented to person, place, and time. No cranial nerve deficit. She exhibits normal muscle tone. Coordination normal.  No ataxia on finger to nose bilaterally. No pronator drift. 5/5 strength throughout. CN 2-12 intact.Equal grip strength. Sensation intact.   Skin: Skin is warm.  Psychiatric: She has a normal mood and affect. Her behavior is normal.  Nursing note and vitals reviewed.   ED Course  Procedures (including critical care time) Labs Review Labs Reviewed  CBC WITH DIFFERENTIAL/PLATELET - Abnormal; Notable for the following:    Hemoglobin 11.6 (*)    MCH 25.5 (*)    All other components within normal limits  COMPREHENSIVE METABOLIC PANEL - Abnormal; Notable for the following:    Potassium 3.3 (*)    Glucose, Bld 144 (*)    BUN 5 (*)    Alkaline Phosphatase 142 (*)    Total Bilirubin 0.2 (*)    All other components within normal limits  URINALYSIS, ROUTINE W REFLEX MICROSCOPIC (NOT AT [facility]) - Abnormal; Notable for the following:    Leukocytes, UA TRACE (*)    All other components within normal limits  URINE MICROSCOPIC-ADD ON - Abnormal; Notable for the following:    Squamous Epithelial / LPF 0-5 (*)    Bacteria, UA RARE (*)    All other components within normal limits  I-STAT CG4 LACTIC ACID, ED - Abnormal;  Notable for the following:    Lactic Acid, Venous 3.63 (*)    All other components within normal limits  LIPASE, BLOOD  I-STAT CG4 LACTIC ACID, ED    Imaging Review Dg Abd Acute W/chest  07/07/2015  CLINICAL DATA:  Abdominal pain for 1 day.  History of pancreatitis EXAM: DG ABDOMEN ACUTE W/ 1V CHEST COMPARISON:  Chest radiograph October 21, 2012; abdominal radiograph October 22, 2012; CT abdomen and pelvis July 03, 2015 FINDINGS: PA chest: There is no edema or consolidation. Heart size and pulmonary vascularity are normal. Aorta is mildly tortuous  but stable. Patient is undergone several kyphoplasty procedures in the mid thoracic region. Supine and upright abdomen: There is moderate stool throughout colon. There is no appreciable bowel dilatation or air-fluid level suggesting obstruction. No free air. There are multiple surgical clips in the right and left abdomen regions. Patient is undergone kyphoplasty procedures at L3 and L5. There is a total hip prosthesis on the left. IMPRESSION: No bowel obstruction or free air. Moderate stool in colon. Postoperative change at multiple sites. No lung edema or consolidation. Electronically Signed   By: Lowella Grip III M.D.   On: 07/07/2015 17:27   I have personally reviewed and evaluated these images and lab results as part of my medical decision-making.   EKG Interpretation None      MDM   Final diagnoses:  Intractable abdominal pain   Acute on chronic abdominal pain with nausea and vomiting, felt to be from chronic pancreatitis versus gastroparesis. TTP epigatrium   CT scan April 13 showed minimally prominent pancreatic duct. No other acute findings.  Labs unremarkable other than lactate of 3.6. Acute abdominal series negative. No leukocytosis or fever UA negative.  CT obtained due to elevated lactate. No acute findings.  Bladder distended.  Was ~600 cc, then ~200 cc after urination.    Lactate has cleared.  Patient tolerating PO and  states pain is improved. Plan discharge with outpatient followup with GI>  Patient now stating that "I can't go home, I'm hurting too much".  She is requesting to be admitted.  Tried to reassure her that her workup is reassuring and she was just admitted for same symptoms.  She states she cannot go home.  Observation admission d/w dR. Shanon Brow.   Ezequiel Essex, MD 07/08/15 [phone]

## 2015-07-07 NOTE — Telephone Encounter (Signed)
APPT MADE AND LETTER SENT  °

## 2015-07-07 NOTE — ED Notes (Signed)
Pt in CT.

## 2015-07-07 NOTE — ED Notes (Signed)
Pt drinking po contrast and tolerating well.

## 2015-07-08 ENCOUNTER — Encounter (HOSPITAL_COMMUNITY): Payer: Self-pay | Admitting: *Deleted

## 2015-07-08 DIAGNOSIS — R109 Unspecified abdominal pain: Secondary | ICD-10-CM | POA: Diagnosis not present

## 2015-07-08 DIAGNOSIS — M0579 Rheumatoid arthritis with rheumatoid factor of multiple sites without organ or systems involvement: Secondary | ICD-10-CM

## 2015-07-08 DIAGNOSIS — K8689 Other specified diseases of pancreas: Secondary | ICD-10-CM | POA: Diagnosis not present

## 2015-07-08 LAB — COMPREHENSIVE METABOLIC PANEL
ALT: 14 U/L (ref 14–54)
ANION GAP: 8 (ref 5–15)
AST: 27 U/L (ref 15–41)
Albumin: 3.6 g/dL (ref 3.5–5.0)
Alkaline Phosphatase: 115 U/L (ref 38–126)
BILIRUBIN TOTAL: 0.5 mg/dL (ref 0.3–1.2)
CALCIUM: 9.1 mg/dL (ref 8.9–10.3)
CHLORIDE: 106 mmol/L (ref 101–111)
CO2: 27 mmol/L (ref 22–32)
CREATININE: 0.45 mg/dL (ref 0.44–1.00)
Glucose, Bld: 88 mg/dL (ref 65–99)
POTASSIUM: 3.5 mmol/L (ref 3.5–5.1)
Sodium: 141 mmol/L (ref 135–145)
TOTAL PROTEIN: 6.6 g/dL (ref 6.5–8.1)

## 2015-07-08 LAB — CBC
HCT: 33.4 % — ABNORMAL LOW (ref 36.0–46.0)
Hemoglobin: 10.6 g/dL — ABNORMAL LOW (ref 12.0–15.0)
MCH: 25.7 pg — AB (ref 26.0–34.0)
MCHC: 31.7 g/dL (ref 30.0–36.0)
MCV: 80.9 fL (ref 78.0–100.0)
PLATELETS: 347 10*3/uL (ref 150–400)
RBC: 4.13 MIL/uL (ref 3.87–5.11)
RDW: 14.9 % (ref 11.5–15.5)
WBC: 5.5 10*3/uL (ref 4.0–10.5)

## 2015-07-08 MED ORDER — LISINOPRIL 10 MG PO TABS
20.0000 mg | ORAL_TABLET | Freq: Every day | ORAL | Status: DC
  Administered 2015-07-08: 20 mg via ORAL
  Filled 2015-07-08 (×3): qty 2

## 2015-07-08 MED ORDER — SENNA 8.6 MG PO TABS
2.0000 | ORAL_TABLET | Freq: Two times a day (BID) | ORAL | Status: DC
  Administered 2015-07-08 – 2015-07-09 (×3): 17.2 mg via ORAL
  Filled 2015-07-08 (×4): qty 2

## 2015-07-08 MED ORDER — AMITRIPTYLINE HCL 10 MG PO TABS
20.0000 mg | ORAL_TABLET | Freq: Every day | ORAL | Status: DC
  Administered 2015-07-08: 20 mg via ORAL
  Filled 2015-07-08: qty 2

## 2015-07-08 MED ORDER — OXYCODONE-ACETAMINOPHEN 5-325 MG PO TABS
1.0000 | ORAL_TABLET | ORAL | Status: DC | PRN
  Administered 2015-07-08 (×2): 1 via ORAL
  Filled 2015-07-08 (×2): qty 1

## 2015-07-08 MED ORDER — PANTOPRAZOLE SODIUM 40 MG PO TBEC
40.0000 mg | DELAYED_RELEASE_TABLET | Freq: Two times a day (BID) | ORAL | Status: DC
  Administered 2015-07-08 – 2015-07-09 (×3): 40 mg via ORAL
  Filled 2015-07-08 (×3): qty 1

## 2015-07-08 MED ORDER — POTASSIUM CHLORIDE IN NACL 20-0.9 MEQ/L-% IV SOLN
INTRAVENOUS | Status: AC
  Administered 2015-07-08: 02:00:00 via INTRAVENOUS

## 2015-07-08 MED ORDER — ALPRAZOLAM 0.5 MG PO TABS
0.5000 mg | ORAL_TABLET | Freq: Two times a day (BID) | ORAL | Status: DC
  Administered 2015-07-08 – 2015-07-09 (×4): 0.5 mg via ORAL
  Filled 2015-07-08 (×4): qty 1

## 2015-07-08 MED ORDER — DULOXETINE HCL 30 MG PO CPEP
30.0000 mg | ORAL_CAPSULE | Freq: Every day | ORAL | Status: DC
  Administered 2015-07-08 – 2015-07-09 (×2): 30 mg via ORAL
  Filled 2015-07-08 (×2): qty 1

## 2015-07-08 MED ORDER — ONDANSETRON HCL 4 MG/2ML IJ SOLN
4.0000 mg | Freq: Four times a day (QID) | INTRAMUSCULAR | Status: DC | PRN
  Administered 2015-07-09: 4 mg via INTRAVENOUS
  Filled 2015-07-08: qty 2

## 2015-07-08 MED ORDER — ACETAMINOPHEN 325 MG PO TABS: 650.0000 mg | ORAL_TABLET | Freq: Four times a day (QID) | ORAL | Status: DC | PRN

## 2015-07-08 MED ORDER — LISINOPRIL 10 MG PO TABS
20.0000 mg | ORAL_TABLET | Freq: Every day | ORAL | Status: DC
  Administered 2015-07-08 – 2015-07-09 (×2): 20 mg via ORAL

## 2015-07-08 MED ORDER — OXYCODONE-ACETAMINOPHEN 5-325 MG PO TABS
1.0000 | ORAL_TABLET | ORAL | Status: DC | PRN
  Administered 2015-07-08 – 2015-07-09 (×7): 2 via ORAL
  Filled 2015-07-08 (×7): qty 2

## 2015-07-08 MED ORDER — ONDANSETRON 4 MG PO TBDP: 4.0000 mg | ORAL_TABLET | Freq: Four times a day (QID) | ORAL | Status: DC | PRN

## 2015-07-08 MED ORDER — AMITRIPTYLINE HCL 10 MG PO TABS
10.0000 mg | ORAL_TABLET | Freq: Every day | ORAL | Status: DC
  Administered 2015-07-08: 10 mg via ORAL
  Filled 2015-07-08: qty 1

## 2015-07-08 MED ORDER — BOOST / RESOURCE BREEZE PO LIQD
1.0000 | Freq: Two times a day (BID) | ORAL | Status: DC
  Administered 2015-07-08 – 2015-07-09 (×3): 1 via ORAL

## 2015-07-08 MED ORDER — TOPIRAMATE 25 MG PO TABS
50.0000 mg | ORAL_TABLET | Freq: Every day | ORAL | Status: DC
  Administered 2015-07-08 – 2015-07-09 (×2): 50 mg via ORAL
  Filled 2015-07-08 (×5): qty 2

## 2015-07-08 MED ORDER — METOCLOPRAMIDE HCL 10 MG PO TABS
5.0000 mg | ORAL_TABLET | Freq: Three times a day (TID) | ORAL | Status: DC
  Administered 2015-07-08 – 2015-07-09 (×5): 5 mg via ORAL
  Filled 2015-07-08 (×5): qty 1

## 2015-07-08 MED ORDER — BOOST / RESOURCE BREEZE PO LIQD
1.0000 | Freq: Three times a day (TID) | ORAL | Status: DC
  Administered 2015-07-08 – 2015-07-09 (×3): 1 via ORAL

## 2015-07-08 MED ORDER — PANTOPRAZOLE SODIUM 40 MG PO TBEC
40.0000 mg | DELAYED_RELEASE_TABLET | Freq: Two times a day (BID) | ORAL | Status: DC
  Administered 2015-07-08 (×2): 40 mg via ORAL
  Filled 2015-07-08 (×2): qty 1

## 2015-07-08 MED ORDER — ONDANSETRON 4 MG PO TBDP
4.0000 mg | ORAL_TABLET | Freq: Three times a day (TID) | ORAL | Status: DC
  Administered 2015-07-08 – 2015-07-09 (×6): 4 mg via ORAL
  Filled 2015-07-08 (×6): qty 1

## 2015-07-08 MED ORDER — ONDANSETRON HCL 4 MG PO TABS: 4.0000 mg | ORAL_TABLET | Freq: Four times a day (QID) | ORAL | Status: DC | PRN

## 2015-07-08 MED ORDER — ZOLPIDEM TARTRATE 5 MG PO TABS
5.0000 mg | ORAL_TABLET | Freq: Every evening | ORAL | Status: DC | PRN
  Administered 2015-07-08 (×2): 5 mg via ORAL
  Filled 2015-07-08 (×2): qty 1

## 2015-07-08 NOTE — Telephone Encounter (Addendum)
SCHEDULE MRCP FOR THIS MON APR 24. SCHEDULE OPV AT [facility] ASAP TO ASSESS ADDITIONAL MANAGEMENT OPTIONS FOR CHRONIC EPIGASTRIC PAIN.  CONTINUE REGLAN ATC AND ZOFRAN ATC/PRN. CONTINUE ELAVIL. INCREASE TO 20MG  QHS

## 2015-07-08 NOTE — Progress Notes (Signed)
PROGRESS NOTE  Jordan Haney [id number] DOB: 13-Nov-1955 DOA: 07/07/2015 PCP: PROVIDER NOT IN SYSTEM Outpatient Specialists: Dr. Oneida Alar  Brief Narrative: 53 yof with PMH of Roux-en-Y gastric surgery, peptic ulcer disease, dilated pancreatic duct was discharged 4/15 after being treated for epigastric pain and receiving an EGD. MRI of the abdomen was recommended the patient was claustrophobic. Outpatient follow-up with general surgery was recommended for consideration of exploratory laparotomy. Etiology of pain was thought to be possibly chronic pancreatitis or gastroparesis. Also adhesions considered.  Assessment/Plan: 1. Acute on chronic abdominal pain, thought to be secondary to chronic pancreatitis versus gastroparesis versus adhesive disease.  Per chart, pt refuses MRCP.  2. Nausea and vomiting. Continue zofran and IVF. Supportive care. 3. Urinary retention of unclear etiology. Monitor clinically. 4. Chronic pancreatitis. 5. Normocytic anemia suspect nutritional etiology given gastric surgery. Check anemia panel. 6. Rheumatoid arthritis. Stable 7. Duodenal stricture, EGD in last couple of months by Dr Oneida Alar. Stable. 8. Dilation of pancreatic duct. Patient refuses outpatient MRCP. 9. Essential hypertension, stable.    Continue supportive treatment, pain management, antiemetics and advance diet as tolerated as per general surgery and gastroenterology.   Check anemia panel  Advance diet as tolerated   DVT prophylaxis: SCDs Code Status: Full Family Communication: No family bedside.  Disposition Plan Home in 1-2 days  Murray Hodgkins, MD  [facility] Direct contact:  --Via [place]  --[email or web address]; password [email or web address] and click  [phone] contact night coverage as above 07/08/2015, 7:43 AM    Consultants:  GI  General surgery   Procedures:  None  Antimicrobials:  None  HPI/Subjective: Sharp midabdominal radiating to back. Moderate nausea but no vomiting.     Objective: Filed Vitals:   07/07/15 1824 07/07/15 2230 07/08/15 0105 07/08/15 0700  BP: 189/92 169/108 168/107 160/90  Pulse: 80 77 72 80  Temp:   98.7 F (37.1 C) 98.2 F (36.8 C)  TempSrc:   Oral Oral  Resp: 16 18 15 16   Height:   5\' 3"  (1.6 m)   Weight:   46.131 kg (101 lb 11.2 oz)   SpO2: 100% 96% 99% 98%    Intake/Output Summary (Last 24 hours) at 07/08/15 0743 Last data filed at 07/07/15 2216  Gross per 24 hour  Intake   2000 ml  Output      0 ml  Net   2000 ml     Filed Weights   07/07/15 1403 07/08/15 0105  Weight: 49.896 kg (110 lb) 46.131 kg (101 lb 11.2 oz)    Exam:   Afebrile, VSS, not hypoxic  Constitutional:  . Appears calm and comfortable Respiratory:  . CTA bilaterally, no w/r/r.  . Respiratory effort normal. No retractions or accessory muscle use Cardiovascular:  . RRR, no m/r/g . No LE extremity edema  Abdomen:  . No tenderness or masses, Well healed midline incision, epgastirc pain with palpation  Psychiatric:  . Mental status o Mood, affect appropriate  I have personally reviewed following labs and imaging studies:  CBC unremarkable, except 10.6  Repeat lactic acid overnight 1.44  Hgb stable 10.6  CT A/P: Urinary retention  BMP unremarkable   Scheduled Meds: . ALPRAZolam  0.5 mg Oral BID  . amitriptyline  10 mg Oral QHS  . DULoxetine  30 mg Oral Daily  . feeding supplement  1 Container Oral BID BM  . lisinopril  20 mg Oral Daily  . lisinopril  20 mg Oral Daily  . pantoprazole  40 mg Oral BID  .  senna  2 tablet Oral BID  . topiramate  50 mg Oral Daily   Continuous Infusions: . 0.9 % NaCl with KCl 20 mEq / L 75 mL/hr at 07/08/15 0215    Principal Problem:   Intractable abdominal pain Active Problems:   Nausea and vomiting   Rheumatoid arthritis ([facility])   Fatty liver   PUD (peptic ulcer disease)   Duodenal stricture   Dilation of pancreatic duct ([place])   Essential hypertension     Time spent 20 minutes  By  signing my name below, I, Rennis Harding attest that this documentation has been prepared under the direction and in the presence of Murray Hodgkins, MD Electronically signed: Rennis Harding  07/08/2015 10:28am    I personally performed the services described in this documentation. All medical record entries made by the scribe were at my direction. I have reviewed the chart and agree that the record reflects my personal performance and is accurate and complete. Murray Hodgkins, MD

## 2015-07-08 NOTE — Consult Note (Signed)
Reason for Consult: Epigastric pain Referring Physician: Dr. Garey Ham is an 60 y.o. female.  HPI: Patient is a 60 year old white female status post an antrectomy with choledochoduodenostomy in the past who presents with a 3 week history of epigastric pain. This seems to be sharp in nature, heading straight back to the back. It is not associated with any certain movement or food. It seems to come and go. No emesis is noted. Nothing relieves the pain.  Past Medical History  Diagnosis Date  . RA (rheumatoid arthritis) ([place])  2008  . Constipation 03/21/2011  . Hypokalemia 03/21/2011  . Fatty liver 03/21/2011  . Pancreatitis     states in remote past, very severe, ?biliary pancreatitis   . T12 compression fracture ([place]) 2011  . Duodenal ulcer     remote per patient. +BC powders, patient report negative EGD six months ago  . Dilated pancreatic duct ([place])     ?chronic pancreatitis   . Kidney stones   . Chronic abdominal pain   . Pneumonia     as child  . Anemia   . Lumbar stress fracture   . Anxiety   . Essential hypertension 06/09/2015    Past Surgical History  Procedure Laterality Date  . Cholecystectomy    . Knee surgery    . Appendectomy    . Complete hysterectomy    . Ventral wall hernia    . Esophagogastroduodenoscopy  08/2010    Dr. Earley Brooke, [place]. small hh, mild prepylori gastritis. path unavailable  . Esophagogastroduodenoscopy  04/2010    Dr. Earley Brooke, Versed. small hh, mild distal esophagitis, antral gastritis and duodenitis. Bx mild chronic gastritis and no H.pylori  . Esophagogastroduodenoscopy  08/2009    Dr. Posey Pronto, Versed. Moderate gastritis, moderate duodenitis with nodularity in proximal duodenal bulb, bx chronic gastritis, no helicobacter, mild chronic duodenitis  . Esophagogastroduodenoscopy  02/2007    Dr. Earley Brooke, Versed. antral gastritis and duodenal ulcers. Bx mild chronic gastritis. No bx from duodenal ulcer available.  [name] dilation   04/06/2011    Procedure: SAVORY DILATION;  Surgeon: Dorothyann Peng, MD;  Location: [facility];  Service: Endoscopy;  Laterality: N/A;  16 mm  . Esophagogastroduodenoscopy  04/06/11    Dr. Oneida Alar: 1-2 cm hiatal hernia, mod gastritis, 32mX3mm linear ulcer in duodenal bulb, stricture 1st part of duodenum, dilated to 12 mm  . Esophagogastroduodenoscopy  05/12/11    partial gastric oulet obstruction secondary to stricture between bulb/2nd portion of duodenum with marked friability and inflammation but no discrete ulcer, dilated stomach. s/p dilation but high risk for restenosis  . Esophagogastroduodenoscopy  06/22/2011    [id number]'Bring/Moderate gastritis/PERSISTENT Ulcer in the bulb/descending duodenum Stricture in the bulb/descending duodenum  . Gastrojejunostomy  06/28/2011    Roux-en-Y with choledochoduodenostomy   . Abdominal hysterectomy    . Kidney stone surgery    . Esophagogastroduodenoscopy Left 06/25/2012    RMR: Normal esophagus. Surgically altered anastomosis to  small intestine noted. Couple of  at the anastomosis.  Patent efferent limb Patient had an oozing 1 cm anastomotic ulcer  . Back surgery  2013  . Colonoscopy N/A 08/16/2012    Dr. RGala Romney normal but inadequate prep. Consider early interval colonoscopy.   . Cardiac catheterization      2008  . Esophagogastroduodenoscopy Left 10/23/2012    Dr. RGala Romney mild erosive reflux esophagitis, s/p hemigastrectomy, prior site of gastric ulcer healed   . [place]  [place]    . Kyphoplasty  Jan 2017    [place], [place]   . Exploratory laparotomy w/ bowel resection      2014, sometime in the summer   . Esophagogastroduodenoscopy N/A 06/11/2015    Dr. Gala Romney: normal esophagus, roux-en-y anastomosis     Family History  Problem Relation Age of Onset  . Colon cancer Neg Hx   . Liver cancer Neg Hx   . Breast cancer Neg Hx   . Inflammatory bowel disease Neg Hx   . Heart attack Mother      Social History:  reports that she has never smoked. She has never used smokeless tobacco. She reports that she does not drink alcohol or use illicit drugs.  Allergies:  Allergies  Allergen Reactions  . Compazine Anaphylaxis  . Phenergan [Promethazine] Anaphylaxis  . Vistaril [Hydroxyzine Hcl] Other (See Comments)    Reaction: legs shake  . Benadryl [Diphenhydramine Hcl] Other (See Comments)    Pt states, "it makes my legs shake really bad"   . Gabapentin Other (See Comments)    Reactions: legs shake  . Latex Swelling    Medications: I have reviewed the patient's current medications.  Results for orders placed or performed during the hospital encounter of 07/07/15 (from the past 48 hour(s))  CBC with Differential/Platelet     Status: Abnormal   Collection Time: 07/07/15  4:23 PM  Result Value Ref Range   WBC 7.3 4.0 - 10.5 K/uL   RBC 4.55 3.87 - 5.11 MIL/uL   Hemoglobin 11.6 (L) 12.0 - 15.0 g/dL   HCT 36.5 36.0 - 46.0 %   MCV 80.2 78.0 - 100.0 fL   MCH 25.5 (L) 26.0 - 34.0 pg   MCHC 31.8 30.0 - 36.0 g/dL   RDW 14.8 11.5 - 15.5 %   Platelets 395 150 - 400 K/uL   Neutrophils Relative % 74 %   Neutro Abs 5.4 1.7 - 7.7 K/uL   Lymphocytes Relative 21 %   Lymphs Abs 1.6 0.7 - 4.0 K/uL   Monocytes Relative 4 %   Monocytes Absolute 0.3 0.1 - 1.0 K/uL   Eosinophils Relative 0 %   Eosinophils Absolute 0.0 0.0 - 0.7 K/uL   Basophils Relative 1 %   Basophils Absolute 0.0 0.0 - 0.1 K/uL  Comprehensive metabolic panel     Status: Abnormal   Collection Time: 07/07/15  4:23 PM  Result Value Ref Range   Sodium 135 135 - 145 mmol/L   Potassium 3.3 (L) 3.5 - 5.1 mmol/L   Chloride 101 101 - 111 mmol/L   CO2 22 22 - 32 mmol/L   Glucose, Bld 144 (H) 65 - 99 mg/dL   BUN 5 (L) 6 - 20 mg/dL   Creatinine, Ser 0.53 0.44 - 1.00 mg/dL   Calcium 9.2 8.9 - 10.3 mg/dL   Total Protein 8.0 6.5 - 8.1 g/dL   Albumin 4.4 3.5 - 5.0 g/dL   AST 34 15 - 41 U/L   ALT 16 14 - 54 U/L   Alkaline  Phosphatase 142 (H) 38 - 126 U/L   Total Bilirubin 0.2 (L) 0.3 - 1.2 mg/dL   GFR calc non Af Amer >60 >60 mL/min   GFR calc Af Amer >60 >60 mL/min    Comment: (NOTE) The eGFR has been calculated using the CKD EPI equation. This calculation has not been validated in all clinical situations. eGFR's persistently <60 mL/min signify possible Chronic Kidney Disease.    Anion gap  12 5 - 15  Lipase, blood     Status: None   Collection Time: 07/07/15  4:23 PM  Result Value Ref Range   Lipase 23 11 - 51 U/L  I-Stat CG4 Lactic Acid, ED     Status: Abnormal   Collection Time: 07/07/15  5:01 PM  Result Value Ref Range   Lactic Acid, Venous 3.63 (HH) 0.5 - 2.0 mmol/L  Urinalysis, Routine w reflex microscopic (not at [facility])     Status: Abnormal   Collection Time: 07/07/15  5:50 PM  Result Value Ref Range   Color, Urine YELLOW YELLOW   APPearance CLEAR CLEAR   Specific Gravity, Urine 1.010 1.005 - 1.030   pH 6.0 5.0 - 8.0   Glucose, UA NEGATIVE NEGATIVE mg/dL   Hgb urine dipstick NEGATIVE NEGATIVE   Bilirubin Urine NEGATIVE NEGATIVE   Ketones, ur NEGATIVE NEGATIVE mg/dL   Protein, ur NEGATIVE NEGATIVE mg/dL   Nitrite NEGATIVE NEGATIVE   Leukocytes, UA TRACE (A) NEGATIVE  Urine microscopic-add on     Status: Abnormal   Collection Time: 07/07/15  5:50 PM  Result Value Ref Range   Squamous Epithelial / LPF 0-5 (A) NONE SEEN   WBC, UA 0-5 0 - 5 WBC/hpf   RBC / HPF NONE SEEN 0 - 5 RBC/hpf   Bacteria, UA RARE (A) NONE SEEN  I-Stat CG4 Lactic Acid, ED     Status: None   Collection Time: 07/07/15  7:51 PM  Result Value Ref Range   Lactic Acid, Venous 1.44 0.5 - 2.0 mmol/L  Comprehensive metabolic panel     Status: Abnormal   Collection Time: 07/08/15  5:44 AM  Result Value Ref Range   Sodium 141 135 - 145 mmol/L   Potassium 3.5 3.5 - 5.1 mmol/L   Chloride 106 101 - 111 mmol/L   CO2 27 22 - 32 mmol/L   Glucose, Bld 88 65 - 99 mg/dL   BUN <5 (L) 6 - 20 mg/dL   Creatinine, Ser 0.45 0.44 -  1.00 mg/dL   Calcium 9.1 8.9 - 10.3 mg/dL   Total Protein 6.6 6.5 - 8.1 g/dL   Albumin 3.6 3.5 - 5.0 g/dL   AST 27 15 - 41 U/L   ALT 14 14 - 54 U/L   Alkaline Phosphatase 115 38 - 126 U/L   Total Bilirubin 0.5 0.3 - 1.2 mg/dL   GFR calc non Af Amer >60 >60 mL/min   GFR calc Af Amer >60 >60 mL/min    Comment: (NOTE) The eGFR has been calculated using the CKD EPI equation. This calculation has not been validated in all clinical situations. eGFR's persistently <60 mL/min signify possible Chronic Kidney Disease.    Anion gap 8 5 - 15  CBC     Status: Abnormal   Collection Time: 07/08/15  5:44 AM  Result Value Ref Range   WBC 5.5 4.0 - 10.5 K/uL   RBC 4.13 3.87 - 5.11 MIL/uL   Hemoglobin 10.6 (L) 12.0 - 15.0 g/dL   HCT 33.4 (L) 36.0 - 46.0 %   MCV 80.9 78.0 - 100.0 fL   MCH 25.7 (L) 26.0 - 34.0 pg   MCHC 31.7 30.0 - 36.0 g/dL   RDW 14.9 11.5 - 15.5 %   Platelets 347 150 - 400 K/uL    Ct Abdomen Pelvis W Contrast  07/07/2015  CLINICAL DATA:  Worsening abdominal pain.  Elevated lactate. EXAM: CT ABDOMEN AND PELVIS WITH CONTRAST TECHNIQUE: Multidetector CT imaging of the  abdomen and pelvis was performed using the standard protocol following bolus administration of intravenous contrast. CONTRAST:  1 ISOVUE-300 IOPAMIDOL (ISOVUE-300) INJECTION 61% COMPARISON:  07/03/2015 FINDINGS: Lung bases are clear.  No pleural or pericardial fluid. Advanced fatty change of the liver remains evident. There is been previous cholecystectomy. The spleen is normal. The pancreatic parenchyma is normal. The pancreatic duct is slightly prominent. No sign of pancreatitis. Multiple surgical clips in the region of the head of the pancreas in the distal common duct. The adrenal glands are normal. Renal collecting systems are slightly prominent, but this does not appear to be on the basis of obstruction of the ureters. There is a benign appearing renal cyst at the upper pole on the left. The aorta is tortuous but there  is no aneurysm. The IVC is normal. The bladder is markedly distended, probably the etiology of the renal collecting system fullness. No evidence of pelvic soft tissue mass. No acute bowel pathology is seen. No free fluid or air. Old spinal fractures with previous augmentation. IMPRESSION: Markedly distended bladder, presumably neurogenic. Fullness of the renal collecting systems, probably secondary to that. Advanced fatty liver as seen previously. Electronically Signed   By: Nelson Chimes M.D.   On: 07/07/2015 20:39   Dg Abd Acute W/chest  07/07/2015  CLINICAL DATA:  Abdominal pain for 1 day.  History of pancreatitis EXAM: DG ABDOMEN ACUTE W/ 1V CHEST COMPARISON:  Chest radiograph October 21, 2012; abdominal radiograph October 22, 2012; CT abdomen and pelvis July 03, 2015 FINDINGS: PA chest: There is no edema or consolidation. Heart size and pulmonary vascularity are normal. Aorta is mildly tortuous but stable. Patient is undergone several kyphoplasty procedures in the mid thoracic region. Supine and upright abdomen: There is moderate stool throughout colon. There is no appreciable bowel dilatation or air-fluid level suggesting obstruction. No free air. There are multiple surgical clips in the right and left abdomen regions. Patient is undergone kyphoplasty procedures at L3 and L5. There is a total hip prosthesis on the left. IMPRESSION: No bowel obstruction or free air. Moderate stool in colon. Postoperative change at multiple sites. No lung edema or consolidation. Electronically Signed   By: Lowella Grip III M.D.   On: 07/07/2015 17:27    ROS: See chart Blood pressure 161/91, pulse 74, temperature 98 F (36.7 C), temperature source Oral, resp. rate 16, height _0  (1.6 m), weight 46.131 kg (101 lb 11.2 oz), SpO2 97 %. Physical Exam: Pleasant white female who appears slightly anxious. Abdomen is soft and flat. There is a small easily reducible incisional hernia along the inferior aspect of her midline  incision at the umbilical level. This does not reproduce her pain. She points to an area superior to this point. No rigidity is noted. No masses are noted.  Assessment/Plan: Impression: Abdominal pain of unknown etiology. Patient had a complicated course after her original surgery. She had issues with chronic pain after that surgery. That she has a slightly dilated common bile duct system is not common after undergoing choledochoduodenostomy. Her incisional hernia is not causing her the abdominal pain she is describing. I do not have an etiology for her abdominal pain. Given her complicated course after previous surgery, I do not recommend exploratory laparotomy as this will probably be a low yield procedure.  Kelilah Hebard A 07/08/2015, 1:44 PM

## 2015-07-08 NOTE — H&P (Signed)
PCP:   PROVIDER NOT IN SYSTEM   Chief Complaint:  N/v abd pain for 2 days  HPI: 60 yo female with h/o 3 weeks of abdominal pain h/o rouy n y gastric surgery years ago, with PUD, dilated pancreatitic duct. Just d/c within the last couple of days for same issue, was arranged to get open MRCP due to her claustrophobia but she refuses to consider doing open mri due to her anxiety.  Etiology of her pain has been managed by dr fields mostly who has mentioned to her it may be chronic pancreaitits, gastroparesis.  Also mentioned for her to follow up with dr Arnoldo Morale as her pain may also be due to adhesions for possible ex lap.  Pt denies any fevers.  She feels better with zofran in the ED.  Pt referred for admission for her acute on chronic abdominal pain with n/v.  Review of Systems:  Positive and negative as per HPI otherwise all other systems are negative  Past Medical History: Past Medical History  Diagnosis Date  . RA (rheumatoid arthritis) ([place])  2008  . Constipation 03/21/2011  . Hypokalemia 03/21/2011  . Fatty liver 03/21/2011  . Pancreatitis     states in remote past, very severe, ?biliary pancreatitis   . T12 compression fracture ([place]) 2011  . Duodenal ulcer     remote per patient. +BC powders, patient report negative EGD six months ago  . Dilated pancreatic duct ([place])     ?chronic pancreatitis   . Kidney stones   . Chronic abdominal pain   . Pneumonia     as child  . Anemia   . Lumbar stress fracture   . Anxiety   . Essential hypertension 06/09/2015   Past Surgical History  Procedure Laterality Date  . Cholecystectomy    . Knee surgery    . Appendectomy    . Complete hysterectomy    . Ventral wall hernia    . Esophagogastroduodenoscopy  08/2010    Dr. Earley Brooke, [place]. small hh, mild prepylori gastritis. path unavailable  . Esophagogastroduodenoscopy  04/2010    Dr. Earley Brooke, [place]. small hh, mild distal esophagitis, antral gastritis and duodenitis. Bx mild chronic  gastritis and no H.pylori  . Esophagogastroduodenoscopy  08/2009    Dr. Posey Pronto, [place]. Moderate gastritis, moderate duodenitis with nodularity in proximal duodenal bulb, bx chronic gastritis, no helicobacter, mild chronic duodenitis  . Esophagogastroduodenoscopy  02/2007    Dr. Earley Brooke, [place]. antral gastritis and duodenal ulcers. Bx mild chronic gastritis. No bx from duodenal ulcer available.  [name] dilation  04/06/2011    Procedure: SAVORY DILATION;  Surgeon: Dorothyann Peng, MD;  Location: [facility];  Service: Endoscopy;  Laterality: N/A;  16 mm  . Esophagogastroduodenoscopy  04/06/11    Dr. Oneida Alar: 1-2 cm hiatal hernia, mod gastritis, 42mmX3mm linear ulcer in duodenal bulb, stricture 1st part of duodenum, dilated to 12 mm  . Esophagogastroduodenoscopy  05/12/11    partial gastric oulet obstruction secondary to stricture between bulb/2nd portion of duodenum with marked friability and inflammation but no discrete ulcer, dilated stomach. s/p dilation but high risk for restenosis  . Esophagogastroduodenoscopy  06/22/2011    [id number] ring/Moderate gastritis/PERSISTENT Ulcer in the bulb/descending duodenum Stricture in the bulb/descending duodenum  . Gastrojejunostomy  06/28/2011    Roux-en-Y with choledochoduodenostomy   . Abdominal hysterectomy    . Kidney stone surgery    . Esophagogastroduodenoscopy Left 06/25/2012    RMR: Normal esophagus. Surgically altered anastomosis to  small intestine noted. Couple of  at the anastomosis.  Patent efferent limb Patient had an oozing 1 cm anastomotic ulcer  . Back surgery  2013  . Colonoscopy N/A 08/16/2012    Dr. Gala Romney: normal but inadequate prep. Consider early interval colonoscopy.   . Cardiac catheterization      2008  . Esophagogastroduodenoscopy Left 10/23/2012    Dr. Gala Romney: mild erosive reflux esophagitis, s/p hemigastrectomy, prior site of gastric ulcer healed   . Total hip arthroplasty Left 01/2015    in [place], [place]  . Total hip arthroplasty    .  Kyphoplasty  Jan 2017    [place], [place]   . Exploratory laparotomy w/ bowel resection      2014, sometime in the summer   . Esophagogastroduodenoscopy N/A 06/11/2015    Procedure: ESOPHAGOGASTRODUODENOSCOPY (EGD);  Surgeon: Daneil Dolin, MD;  Location: [facility];  Service: Endoscopy;  Laterality: N/A;    Medications: Prior to Admission medications   Medication Sig Start Date End Date Taking? Authorizing Provider  ALPRAZolam Duanne Moron) 0.5 MG tablet Take 0.5 mg by mouth 2 (two) times daily.   Yes Historical Provider, MD  amitriptyline (ELAVIL) 10 MG tablet Take 1 tablet (10 mg total) by mouth at bedtime. 07/05/15  Yes Orvan Falconer, MD  DULoxetine (CYMBALTA) 30 MG capsule Take 30 mg by mouth daily.  06/27/15  Yes Historical Provider, MD  feeding supplement (BOOST / RESOURCE BREEZE) LIQD Take 1 Container by mouth 2 (two) times daily between meals. 06/17/15  Yes Kathie Dike, MD  lisinopril (PRINIVIL,ZESTRIL) 20 MG tablet Take 20 mg by mouth daily.   Yes Historical Provider, MD  ondansetron (ZOFRAN ODT) 4 MG disintegrating tablet Take 1 tablet (4 mg total) by mouth every 6 (six) hours as needed for nausea or vomiting. 06/17/15  Yes Kathie Dike, MD  oxyCODONE-acetaminophen (PERCOCET/ROXICET) 5-325 MG tablet Take 1 tablet by mouth every 4 (four) hours as needed for moderate pain or severe pain. 07/05/15  Yes Orvan Falconer, MD  pantoprazole (PROTONIX) 40 MG tablet Take 1 tablet (40 mg total) by mouth 2 (two) times daily. 06/02/15  Yes Kathie Dike, MD  senna (SENOKOT) 8.6 MG TABS tablet Take 2 tablets (17.2 mg total) by mouth 2 (two) times daily. 06/02/15  Yes Kathie Dike, MD  topiramate (TOPAMAX) 50 MG tablet Take 50 mg by mouth daily.  06/27/15  Yes Historical Provider, MD    Allergies:   Allergies  Allergen Reactions  . Compazine Anaphylaxis  . Phenergan [Promethazine] Anaphylaxis  . Vistaril [Hydroxyzine Hcl] Other (See Comments)    Reaction: legs shake  . Benadryl [Diphenhydramine Hcl] Other (See  Comments)    Pt states, "it makes my legs shake really bad"   . Gabapentin Other (See Comments)    Reactions: legs shake  . Latex Swelling    Social History:  reports that she has never smoked. She has never used smokeless tobacco. She reports that she does not drink alcohol or use illicit drugs.  Family History: Family History  Problem Relation Age of Onset  . Colon cancer Neg Hx   . Liver cancer Neg Hx   . Breast cancer Neg Hx   . Inflammatory bowel disease Neg Hx   . Heart attack Mother     Physical Exam: Filed Vitals:   07/07/15 1403 07/07/15 1714 07/07/15 1824 07/07/15 2230  BP: 171/107 164/98 189/92 169/108  Pulse: 95 88 80 77  Temp: 99.1 F (37.3 C)     TempSrc: Oral     Resp: 16 16  16 18  Weight: 49.896 kg (110 lb)     SpO2: 98% 100% 100% 96%   BP 168/107 mmHg  Pulse 72  Temp(Src) 98.7 F (37.1 C) (Oral)  Resp 15  Ht 5\' 3"  (1.6 m)  Wt 46.131 kg (101 lb 11.2 oz)  BMI 18.02 kg/m2  SpO2 99% General appearance: alert, cooperative and no distress highly anxious Head: Normocephalic, without obvious abnormality, atraumatic Eyes: negative Nose: Nares normal. Septum midline. Mucosa normal. No drainage or sinus tenderness. Neck: no JVD and supple, symmetrical, trachea midline Lungs: clear to auscultation bilaterally Heart: regular rate and rhythm, S1, S2 normal, no murmur, click, rub or gallop Abdomen: soft, non-tender; bowel sounds normal; no masses,  no organomegaly Extremities: extremities normal, atraumatic, no cyanosis or edema Pulses: 2+ and symmetric Skin: Skin color, texture, turgor normal. No rashes or lesions Neurologic: Grossly normal   Labs on Admission:   Recent Labs  07/05/15 0607 07/07/15 1623  NA 140 135  K 3.7 3.3*  CL 106 101  CO2 27 22  GLUCOSE 111* 144*  BUN <5* 5*  CREATININE 0.48 0.53  CALCIUM 8.5* 9.2    Recent Labs  07/05/15 0607 07/07/15 1623  AST 27 34  ALT 13* 16  ALKPHOS 106 142*  BILITOT 0.4 0.2*  PROT 6.0* 8.0   ALBUMIN 3.3* 4.4    Recent Labs  07/07/15 1623  LIPASE 23    Recent Labs  07/07/15 1623  WBC 7.3  NEUTROABS 5.4  HGB 11.6*  HCT 36.5  MCV 80.2  PLT 395    Radiological Exams on Admission: Ct Abdomen Pelvis W Contrast  07/07/2015  CLINICAL DATA:  Worsening abdominal pain.  Elevated lactate. EXAM: CT ABDOMEN AND PELVIS WITH CONTRAST TECHNIQUE: Multidetector CT imaging of the abdomen and pelvis was performed using the standard protocol following bolus administration of intravenous contrast. CONTRAST:  1 ISOVUE-300 IOPAMIDOL (ISOVUE-300) INJECTION 61% COMPARISON:  07/03/2015 FINDINGS: Lung bases are clear.  No pleural or pericardial fluid. Advanced fatty change of the liver remains evident. There is been previous cholecystectomy. The spleen is normal. The pancreatic parenchyma is normal. The pancreatic duct is slightly prominent. No sign of pancreatitis. Multiple surgical clips in the region of the head of the pancreas in the distal common duct. The adrenal glands are normal. Renal collecting systems are slightly prominent, but this does not appear to be on the basis of obstruction of the ureters. There is a benign appearing renal cyst at the upper pole on the left. The aorta is tortuous but there is no aneurysm. The IVC is normal. The bladder is markedly distended, probably the etiology of the renal collecting system fullness. No evidence of pelvic soft tissue mass. No acute bowel pathology is seen. No free fluid or air. Old spinal fractures with previous augmentation. IMPRESSION: Markedly distended bladder, presumably neurogenic. Fullness of the renal collecting systems, probably secondary to that. Advanced fatty liver as seen previously. Electronically Signed   By: Nelson Chimes M.D.   On: 07/07/2015 20:39    Dg Abd Acute W/chest  07/07/2015  CLINICAL DATA:  Abdominal pain for 1 day.  History of pancreatitis EXAM: DG ABDOMEN ACUTE W/ 1V CHEST COMPARISON:  Chest radiograph October 21, 2012;  abdominal radiograph October 22, 2012; CT abdomen and pelvis July 03, 2015 FINDINGS: PA chest: There is no edema or consolidation. Heart size and pulmonary vascularity are normal. Aorta is mildly tortuous but stable. Patient is undergone several kyphoplasty procedures in the mid thoracic region. Supine and  upright abdomen: There is moderate stool throughout colon. There is no appreciable bowel dilatation or air-fluid level suggesting obstruction. No free air. There are multiple surgical clips in the right and left abdomen regions. Patient is undergone kyphoplasty procedures at L3 and L5. There is a total hip prosthesis on the left. IMPRESSION: No bowel obstruction or free air. Moderate stool in colon. Postoperative change at multiple sites. No lung edema or consolidation. Electronically Signed   By: Lowella Grip III M.D.   On: 07/07/2015 17:27   Dg Ugi W/small Bowel High Density  06/10/2015  CLINICAL DATA:  60 year old female with epigastric pain common nausea and vomiting for the past 3 days. 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 Additional history of small-bowel obstruction status post partial small bowel resection 2 years ago. EXAM: UPPER GI SERIES WITH SMALL BOWEL FOLLOW-THROUGH FLUOROSCOPY TIME:  If the device does not provide the exposure index: Fluoroscopy Time (in minutes and seconds):  2 minutes and 54 seconds Number of Acquired Images:  28 TECHNIQUE: Combined double contrast and single contrast upper GI series using effervescent crystals, thick barium, and thin barium. Subsequently, serial images of the small bowel were obtained including spot views of the terminal ileum. COMPARISON:  No priors. FINDINGS: Preprocedure KUB demonstrated gas and stool throughout the colon extending to the distal rectum. No pathologic dilatation of small bowel. Suture line in the left side of the abdomen. Numerous surgical clips in the right upper quadrant from prior cholecystectomy.  Surgical clip projecting over the right side of the pelvis. Postprocedural changes of vertebroplasty at L3 and L5. Double contrast images of the esophagus demonstrated a normal appearance of the esophageal mucosa. No esophageal mass, stricture or esophageal ring. No hiatal hernia. Esophageal motility was normal. Double contrast images of the stomach are limited secondary to lack of patient mobility, and inability to adequately coat the entire stomach. Postoperative changes of prior Roux-en-Y gastrectomy are noted. Throughout the remaining portions of the stomach there were several areas of subtle nodularity, that could represent small gastric polyps (although some of these findings may be attributable to small gas bubbles in poorly mixed and poorly distributed barium). No definite gastric mass or ulcer was identified. Remaining portions of small bowel were normal in appearance, without focal mucosal abnormality, definite stricture or definite mass. No pathologic dilatation of small bowel. Terminal ileum was normal in appearance. IMPRESSION: 1. No evidence of recurrent bowel obstruction. 2. No acute findings to account for the patient's symptoms. 3. Multiple minor mucosal abnormalities in the stomach that may suggest the presence of multiple small hyperplastic polyps. 4. Normal appearance of the esophagus. Electronically Signed   By: Vinnie Langton M.D.   On: 06/10/2015 14:16   Old chart reviewed  Assessment/Plan  60 yo female with acute abdominal pain waxing /waning for at least 3 weeks with associated nausea and vomiting  Principal Problem:   Intractable abdominal pain-  Prn zofran.  Pt improved already.  Consult GI and general surgery for further work up.  Pt adamant about not doing the mrcp due to her claustophobia, she did the open mri over 6 months ago for her back and could not tolerate it.  abd exam is benign.    Active Problems:   Nausea and vomiting- zofran   Rheumatoid arthritis ([place])-  noted   Fatty liver- noted   PUD (peptic ulcer disease)- cont ppi   Duodenal stricture- noted, had egd in last couple of months by dr fields  Dilation of pancreatic duct [facility])- plan was for MRCP open as outpt, does not appear this plan will be completed    Essential hypertension- noted  obs on medical.  Full code.  DAVID,RACHAL A 07/08/2015, 12:01 AM

## 2015-07-08 NOTE — Telephone Encounter (Signed)
PER TC APR 15. REVIEWED-NO ADDITIONAL RECOMMENDATIONS.

## 2015-07-08 NOTE — Consult Note (Signed)
Referring Provider: Dr. Derrill Kay  Primary Gastroenterologist:  Dr. Oneida Alar   Date of Admission: 07/07/15 Date of Consultation: 07/08/15  Reason for Consultation:  Abdominal pain, N/V   HPI:  Jordan Haney is a 60 y.o. year old female with a complicated past medical history to include PUD with multiple EGDs historically, presenting with UGI bleed in Feb 2013 and found to have large duodenal ulcer with stricture. It was felt she had NSAID-induced PUD. Follow-up EGD in April 2013 with non-healing ulcer; therefore, she underwent Roux-en-Y gastrojejunostomy by Dr. Geroge Haney and had to have a choledochoduodnestomy as well due to bile duct injury. Most recent EGD in March 2017 during last admission completely normal. She has been admitted 4 times over the past 6 months . She has a history of pancreatitis in remote past, felt to be biliary in origin at that time. There is some question of chronic pancreatitis, although this has not been extensively evaluated.  Gallbladder absent. Chronically elevated alk phos level with normal GGT and AMA. She was discharged 06/17/15 and went to [facility] 3/31, where she was hospitalized until April 7th. At that time, Pain Consult completed, recommending Topamax, Cymbalta instead of Paxil, and consider celiac plexus block. She returned to [facility] again on 4/12 with diarrhea. Stool studies negative. Open MRCP recommended for further evaluation of dilated bile duct and pancreas duct. She has declined ANY MRCP due to claustrophobia. She was also referred to Dr. Arnoldo Morale as an outpatient for consideration of exploratory surgery. Differentials include adhesions, intermittent internal hernia, less likely biliary or pancreatic source. She was discharged on 4/15 and returned again to the ED 4/17 for this admission.   She notes her pain never really went away, but she was able to tolerate it. Returned this admission with recurrent epigastric and periumbilical pain, severe. Associated  nausea and vomiting. Little appetite. Ate a few bites of oatmeal this morning. No diarrhea currently but did report some loose stools prior to admission. No fever or chills. No overt GI bleeding.    Past Medical History  Diagnosis Date  . RA (rheumatoid arthritis) ([place])  2008  . Constipation 03/21/2011  . Hypokalemia 03/21/2011  . Fatty liver 03/21/2011  . Pancreatitis     states in remote past, very severe, ?biliary pancreatitis   . T12 compression fracture ([place]) 2011  . Duodenal ulcer     remote per patient. +BC powders, patient report negative EGD six months ago  . Dilated pancreatic duct ([place])     ?chronic pancreatitis   . Kidney stones   . Chronic abdominal pain   . Pneumonia     as child  . Anemia   . Lumbar stress fracture   . Anxiety   . Essential hypertension 06/09/2015    Past Surgical History  Procedure Laterality Date  . Cholecystectomy    . Knee surgery    . Appendectomy    . Complete hysterectomy    . Ventral wall hernia    . Esophagogastroduodenoscopy  08/2010    Dr. Earley Brooke, [place]. small hh, mild prepylori gastritis. path unavailable  . Esophagogastroduodenoscopy  04/2010    Dr. Earley Brooke, Versed. small hh, mild distal esophagitis, antral gastritis and duodenitis. Bx mild chronic gastritis and no H.pylori  . Esophagogastroduodenoscopy  08/2009    Dr. Posey Pronto, Versed. Moderate gastritis, moderate duodenitis with nodularity in proximal duodenal bulb, bx chronic gastritis, no helicobacter, mild chronic duodenitis  . Esophagogastroduodenoscopy  02/2007    Dr. Earley Brooke, [place]. antral gastritis and duodenal  ulcers. Bx mild chronic gastritis. No bx from duodenal ulcer available.  [name] dilation  04/06/2011    Procedure: SAVORY DILATION;  Surgeon: Dorothyann Peng, MD;  Location: [facility];  Service: Endoscopy;  Laterality: N/A;  16 mm  . Esophagogastroduodenoscopy  04/06/11    Dr. Oneida Alar: 1-2 cm hiatal hernia, mod gastritis, 60mX3mm linear ulcer in duodenal bulb, stricture 1st  part of duodenum, dilated to 12 mm  . Esophagogastroduodenoscopy  05/12/11    partial gastric oulet obstruction secondary to stricture between bulb/2nd portion of duodenum with marked friability and inflammation but no discrete ulcer, dilated stomach. s/p dilation but high risk for restenosis  . Esophagogastroduodenoscopy  06/22/2011    STZG:[id number]'Qring/Moderate gastritis/PERSISTENT Ulcer in the bulb/descending duodenum Stricture in the bulb/descending duodenum  . Gastrojejunostomy  06/28/2011    Roux-en-Y with choledochoduodenostomy   . Abdominal hysterectomy    . Kidney stone surgery    . Esophagogastroduodenoscopy Left 06/25/2012    RMR: Normal esophagus. Surgically altered anastomosis to  small intestine noted. Couple of  at the anastomosis.  Patent efferent limb Patient had an oozing 1 cm anastomotic ulcer  . Back surgery  2013  . Colonoscopy N/A 08/16/2012    Dr. RGala Romney normal but inadequate prep. Consider early interval colonoscopy.   . Cardiac catheterization      2008  . Esophagogastroduodenoscopy Left 10/23/2012    Dr. RGala Romney mild erosive reflux esophagitis, s/p hemigastrectomy, prior site of gastric ulcer healed   . [place]    . [place]  . Exploratory laparotomy w/ bowel resection      2014, sometime in the summer   . Esophagogastroduodenoscopy N/A 06/11/2015    Dr. RGala Romney normal esophagus, roux-en-y anastomosis     Prior to Admission medications   Medication Sig Start Date End Date Taking? Authorizing Provider  ALPRAZolam (Duanne Moron 0.5 MG tablet Take 0.5 mg by mouth 2 (two) times daily.   Yes Historical Provider, MD  amitriptyline (ELAVIL) 10 MG tablet Take 1 tablet (10 mg total) by mouth at bedtime. 07/05/15  Yes POrvan Falconer MD  DULoxetine (CYMBALTA) 30 MG capsule Take 30 mg by mouth daily.  06/27/15  Yes Historical Provider, MD  feeding supplement (BOOST / RESOURCE BREEZE) LIQD  Take 1 Container by mouth 2 (two) times daily between meals. 06/17/15  Yes JKathie Dike MD  lisinopril (PRINIVIL,ZESTRIL) 20 MG tablet Take 20 mg by mouth daily.   Yes Historical Provider, MD  ondansetron (ZOFRAN ODT) 4 MG disintegrating tablet Take 1 tablet (4 mg total) by mouth every 6 (six) hours as needed for nausea or vomiting. 06/17/15  Yes JKathie Dike MD  oxyCODONE-acetaminophen (PERCOCET/ROXICET) 5-325 MG tablet Take 1 tablet by mouth every 4 (four) hours as needed for moderate pain or severe pain. 07/05/15  Yes POrvan Falconer MD  pantoprazole (PROTONIX) 40 MG tablet Take 1 tablet (40 mg total) by mouth 2 (two) times daily. 06/02/15  Yes JKathie Dike MD  senna (SENOKOT) 8.6 MG TABS tablet Take 2 tablets (17.2 mg total) by mouth 2 (two) times daily. 06/02/15  Yes JKathie Dike MD  topiramate (TOPAMAX) 50 MG tablet Take 50 mg by mouth daily.  06/27/15  Yes Historical Provider, MD    Current Facility-Administered Medications  Medication Dose Route Frequency Provider Last Rate Last Dose  . 0.9 % NaCl with KCl 20 mEq/ L  infusion  Intravenous Continuous Phillips Grout, MD 75 mL/hr at 07/08/15 0215    . ALPRAZolam Duanne Moron) tablet 0.5 mg  0.5 mg Oral BID Phillips Grout, MD   0.5 mg at 07/08/15 0215  . amitriptyline (ELAVIL) tablet 10 mg  10 mg Oral QHS Phillips Grout, MD   10 mg at 07/08/15 0215  . DULoxetine (CYMBALTA) DR capsule 30 mg  30 mg Oral Daily Rachal A Shanon Brow, MD      . feeding supplement (BOOST / RESOURCE BREEZE) liquid 1 Container  1 Container Oral BID BM Phillips Grout, MD      . lisinopril (PRINIVIL,ZESTRIL) tablet 20 mg  20 mg Oral Daily Phillips Grout, MD      . lisinopril (PRINIVIL,ZESTRIL) tablet 20 mg  20 mg Oral Daily Phillips Grout, MD   20 mg at 07/08/15 0252  . ondansetron (ZOFRAN) tablet 4 mg  4 mg Oral Q6H PRN Phillips Grout, MD       Or  . ondansetron (ZOFRAN) injection 4 mg  4 mg Intravenous Q6H PRN Phillips Grout, MD      . ondansetron (ZOFRAN-ODT) disintegrating  tablet 4 mg  4 mg Oral Q6H PRN Phillips Grout, MD      . oxyCODONE-acetaminophen (PERCOCET/ROXICET) 5-325 MG per tablet 1 tablet  1 tablet Oral Q4H PRN Phillips Grout, MD   1 tablet at 07/08/15 0215  . pantoprazole (PROTONIX) EC tablet 40 mg  40 mg Oral BID Phillips Grout, MD   40 mg at 07/08/15 0252  . senna (SENOKOT) tablet 17.2 mg  2 tablet Oral BID Phillips Grout, MD   17.2 mg at 07/08/15 0253  . topiramate (TOPAMAX) tablet 50 mg  50 mg Oral Daily Phillips Grout, MD      . zolpidem (AMBIEN) tablet 5 mg  5 mg Oral QHS PRN Phillips Grout, MD   5 mg at 07/08/15 0215    Allergies as of 07/07/2015 - Review Complete 07/07/2015  Allergen Reaction Noted  . Compazine Anaphylaxis 03/20/2011  . Phenergan [promethazine] Anaphylaxis 10/21/2012  . Vistaril [hydroxyzine hcl] Other (See Comments) 03/20/2011  . Benadryl [diphenhydramine hcl] Other (See Comments) 06/25/2012  . Gabapentin Other (See Comments) 07/21/2011  . Latex Swelling 06/28/2011    Family History  Problem Relation Age of Onset  . Colon cancer Neg Hx   . Liver cancer Neg Hx   . Breast cancer Neg Hx   . Inflammatory bowel disease Neg Hx   . Heart attack Mother     Social History   Social History  . Marital Status: Divorced    Spouse Name: N/A  . Number of Children: 1  . Years of Education: N/A   Occupational History  . unemployed    Social History Main Topics  . Smoking status: Never Smoker   . Smokeless tobacco: Never Used  . Alcohol Use: No  . Drug Use: No  . Sexual Activity: Yes    Birth Control/ Protection: Surgical   Other Topics Concern  . Not on file   Social History Narrative    Review of Systems: Negative unless mentioned in HPI.   Physical Exam: Vital signs in last 24 hours: Temp:  [98.2 F (36.8 C)-99.1 F (37.3 C)] 98.2 F (36.8 C) (04/18 0700) Pulse Rate:  [72-95] 80 (04/18 0700) Resp:  [15-18] 16 (04/18 0700) BP: (160-189)/(90-108) 160/90 mmHg (04/18 0700) SpO2:  [96 %-100 %] 98 %  (04/18 0700) Weight:  [101  lb 11.2 oz (46.131 kg)-110 lb (49.896 kg)] 101 lb 11.2 oz (46.131 kg) (04/18 0105) Last BM Date: 07/07/15 General:   Alert, appears anxious, flat affect Head:  Normocephalic and atraumatic. Eyes:  Sclera clear, no icterus.   Conjunctiva pink. Ears:  Normal auditory acuity. Nose:  No deformity, discharge,  or lesions. Mouth:  No deformity or lesions, dentition normal. Lungs:  Clear throughout to auscultation.   No wheezes, crackles, or rhonchi. No acute distress. Heart:  Regular rate and rhythm; no murmurs, clicks, rubs,  or gallops. Abdomen:  Soft and non-distended. Normoactive bowel sounds. tender to palpation epigastric and periumbilically. No rebound or guarding Rectal:  Deferred  Msk:  Symmetrical without gross deformities. Normal posture. Extremities:  Without edema. Neurologic:  Alert and  oriented x4 Psych:  Alert and cooperative. Flat affect  Intake/Output from previous day: 04/17 0701 - 04/18 0700 In: 2000 [I.V.:2000] Out: -  Intake/Output this shift:    Lab Results:  Recent Labs  07/07/15 1623 07/08/15 0544  WBC 7.3 5.5  HGB 11.6* 10.6*  HCT 36.5 33.4*  PLT 395 347   BMET  Recent Labs  07/07/15 1623 07/08/15 0544  NA 135 141  K 3.3* 3.5  CL 101 106  CO2 22 27  GLUCOSE 144* 88  BUN 5* <5*  CREATININE 0.53 0.45  CALCIUM 9.2 9.1   LFT  Recent Labs  07/07/15 1623 07/08/15 0544  PROT 8.0 6.6  ALBUMIN 4.4 3.6  AST 34 27  ALT 16 14  ALKPHOS 142* 115  BILITOT 0.2* 0.5    Studies/Results: Ct Abdomen Pelvis W Contrast  07/07/2015  CLINICAL DATA:  Worsening abdominal pain.  Elevated lactate. EXAM: CT ABDOMEN AND PELVIS WITH CONTRAST TECHNIQUE: Multidetector CT imaging of the abdomen and pelvis was performed using the standard protocol following bolus administration of intravenous contrast. CONTRAST:  1 ISOVUE-300 IOPAMIDOL (ISOVUE-300) INJECTION 61% COMPARISON:  07/03/2015 FINDINGS: Lung bases are clear.  No pleural or  pericardial fluid. Advanced fatty change of the liver remains evident. There is been previous cholecystectomy. The spleen is normal. The pancreatic parenchyma is normal. The pancreatic duct is slightly prominent. No sign of pancreatitis. Multiple surgical clips in the region of the head of the pancreas in the distal common duct. The adrenal glands are normal. Renal collecting systems are slightly prominent, but this does not appear to be on the basis of obstruction of the ureters. There is a benign appearing renal cyst at the upper pole on the left. The aorta is tortuous but there is no aneurysm. The IVC is normal. The bladder is markedly distended, probably the etiology of the renal collecting system fullness. No evidence of pelvic soft tissue mass. No acute bowel pathology is seen. No free fluid or air. Old spinal fractures with previous augmentation. IMPRESSION: Markedly distended bladder, presumably neurogenic. Fullness of the renal collecting systems, probably secondary to that. Advanced fatty liver as seen previously. Electronically Signed   By: Nelson Chimes M.D.   On: 07/07/2015 20:39   Dg Abd Acute W/chest  07/07/2015  CLINICAL DATA:  Abdominal pain for 1 day.  History of pancreatitis EXAM: DG ABDOMEN ACUTE W/ 1V CHEST COMPARISON:  Chest radiograph October 21, 2012; abdominal radiograph October 22, 2012; CT abdomen and pelvis July 03, 2015 FINDINGS: PA chest: There is no edema or consolidation. Heart size and pulmonary vascularity are normal. Aorta is mildly tortuous but stable. Patient is undergone several kyphoplasty procedures in the mid thoracic region. Supine and upright abdomen: There  is moderate stool throughout colon. There is no appreciable bowel dilatation or air-fluid level suggesting obstruction. No free air. There are multiple surgical clips in the right and left abdomen regions. Patient is undergone kyphoplasty procedures at L3 and L5. There is a total hip prosthesis on the left. IMPRESSION: No  bowel obstruction or free air. Moderate stool in colon. Postoperative change at multiple sites. No lung edema or consolidation. Electronically Signed   By: Lowella Grip III M.D.   On: 07/07/2015 17:27    Impression: 60 year old female with complicated past medical history to include PUD resulting in duodenal stricture and need for surgical intervention as noted above. She has had multiple abdominal procedures secondary to complications. Multiple admissions here at [facility] and also admitted at [facility] last month, as she states she was "told to go there if any further problems". Presenting again with acute on chronic abdominal pain, nausea, and vomiting. EGD up-to-date and unremarkable, as of March 2017. [facility] has recommended consideration for celiac plexus nerve block as an outpatient, along with continuing Topamax and Cymbalta. She is on Elavil as well. We have recommended an MRI/MRCP, which she declines due to claustrophobia. She is also declining any pre-medication for this. She has also been advised to see Dr. Arnoldo Morale as an outpatient, who we will request to see her during this admission. Differentials including adhesive disease, internal hernia, less likely biliary or pancreatic source. High suspicion for chronic abdominal pain and anxiety playing a role. Her weight has remained overall stable over the years, as she tends to stay in the low 100 range. Options limited at this time from our standpoint, as she has been evaluated extensively. Would recommend tertiary care center referral as outpatient for further pain management options if exploratory surgery not felt indicated.    Plan: Continue Elavil, Cymbalta, and Topamax Protonix BID Zofran scheduled with meals Add Boost Breeze Await General Surgery recommendations Follow-up at [facility]        07/08/2015, 8:11 AM

## 2015-07-09 ENCOUNTER — Other Ambulatory Visit: Payer: Self-pay

## 2015-07-09 DIAGNOSIS — R1013 Epigastric pain: Principal | ICD-10-CM

## 2015-07-09 DIAGNOSIS — R112 Nausea with vomiting, unspecified: Secondary | ICD-10-CM

## 2015-07-09 DIAGNOSIS — G8929 Other chronic pain: Secondary | ICD-10-CM

## 2015-07-09 DIAGNOSIS — R109 Unspecified abdominal pain: Secondary | ICD-10-CM | POA: Diagnosis not present

## 2015-07-09 MED ORDER — OXYCODONE-ACETAMINOPHEN 5-325 MG PO TABS: 1.0000 | ORAL_TABLET | ORAL | Status: DC | PRN

## 2015-07-09 NOTE — Telephone Encounter (Signed)
As of 8:30 this morning, patient agreed to OPEN MRI.

## 2015-07-09 NOTE — Discharge Summary (Signed)
Physician Discharge Summary  Jordan Haney [id number] DOB: 03-06-1956 DOA: 07/07/2015  PCP: PROVIDER NOT IN SYSTEM  Admit date: 07/07/2015 Discharge date: 07/09/2015  Time spent: 45 minutes  Recommendations for Outpatient Follow-up:  -Will be discharged home today. -Advised to follow-up with primary care provider in 2 weeks. -GI has arranged for open MRCP and follow up with GI physician at [facility].   Discharge Diagnoses:  Principal Problem:   Intractable abdominal pain Active Problems:   Nausea and vomiting   Rheumatoid arthritis (HCC)   Fatty liver   PUD (peptic ulcer disease)   Duodenal stricture   Dilation of pancreatic duct [facility])   Essential hypertension   Discharge Condition: Stable  Filed Weights   07/07/15 1403 07/08/15 0105 07/09/15 0455  Weight: 49.896 kg (110 lb) 46.131 kg (101 lb 11.2 oz) 46.947 kg (103 lb 8 oz)    History of present illness:  As per Dr. Shanon Brow on 4/18: 60 yo female with h/o 3 weeks of abdominal pain h/o rouy n y gastric surgery years ago, with PUD, dilated pancreatitic duct. Just d/c within the last couple of days for same issue, was arranged to get open MRCP due to her claustrophobia but she refuses to consider doing open mri due to her anxiety. Etiology of her pain has been managed by dr fields mostly who has mentioned to her it may be chronic pancreaitits, gastroparesis. Also mentioned for her to follow up with dr Arnoldo Morale as her pain may also be due to adhesions for possible ex lap. Pt denies any fevers. She feels better with zofran in the ED. Pt referred for admission for her acute on chronic abdominal pain with n/v.  Hospital Course:   Acute and chronic abdominal pain -Unclear etiology at this point. -She is status post an antrectomy with choledochoduodenostomy. -She recently had a normal EGD. -GI scheduled an MRI however the patient has been hesitant due to extreme claustrophobia. -GI  has explained to the patient that they currently have no further etiologies for her nausea vomiting and pain after extensive GI workup. They have schedule an outpatient appointment with [facility] to assess additional management options. Surgery declined exploratory laparotomy due to low yield of finding source for abdominal pain. -She does exhibit some drug-seeking tendencies. I have given her 20 tablets of Percocet and have explained to her that further management of her chronic abdominal pain will need to be performed by physicians at [facility].  Procedures:  None   Consultations:  GI  Surgery  Discharge Instructions  Discharge Instructions    Increase activity slowly    Complete by:  As directed             Medication List    TAKE these medications        ALPRAZolam 0.5 MG tablet  Commonly known as:  XANAX  Take 0.5 mg by mouth 2 (two) times daily.     amitriptyline 10 MG tablet  Commonly known as:  ELAVIL  Take 1 tablet (10 mg total) by mouth at bedtime.     DULoxetine 30 MG capsule  Commonly known as:  CYMBALTA  Take 30 mg by mouth daily.     feeding supplement Liqd  Take 1 Container by mouth 2 (two) times daily between meals.     lisinopril 20 MG tablet  Commonly known as:  PRINIVIL,ZESTRIL  Take 20 mg by mouth daily.     ondansetron 4 MG  disintegrating tablet  Commonly known as:  ZOFRAN ODT  Take 1 tablet (4 mg total) by mouth every 6 (six) hours as needed for nausea or vomiting.     oxyCODONE-acetaminophen 5-325 MG tablet  Commonly known as:  PERCOCET/ROXICET  Take 1 tablet by mouth every 4 (four) hours as needed for moderate pain or severe pain.     pantoprazole 40 MG tablet  Commonly known as:  PROTONIX  Take 1 tablet (40 mg total) by mouth 2 (two) times daily.     senna 8.6 MG Tabs tablet  Commonly known as:  SENOKOT  Take 2 tablets (17.2 mg total) by mouth 2 (two) times daily.     topiramate 50 MG tablet  Commonly  known as:  TOPAMAX  Take 50 mg by mouth daily.       Allergies  Allergen Reactions  . Compazine Anaphylaxis  . Phenergan [Promethazine] Anaphylaxis  . Vistaril [Hydroxyzine Hcl] Other (See Comments)    Reaction: legs shake  . Benadryl [Diphenhydramine Hcl] Other (See Comments)    Pt states, "it makes my legs shake really bad"   . Gabapentin Other (See Comments)    Reactions: legs shake  . Latex Swelling       Follow-up Information    Follow up with Virgia Land, MD On 07/21/2015.   Specialty:  Internal Medicine   Why:  at 3:00 pm   Contact information:   [address] [place] [address] [phone]       Follow up with [facility] On 07/14/2015.   Why:  at 7:00 am  for [facility]   Contact information:   [address] [place] [address] [phone]       The results of significant diagnostics from this hospitalization (including imaging, microbiology, ancillary and laboratory) are listed below for reference.    Significant Diagnostic Studies: Ct Abdomen Pelvis W Contrast  07/07/2015  CLINICAL DATA:  Worsening abdominal pain.  Elevated lactate. EXAM: CT ABDOMEN AND PELVIS WITH CONTRAST TECHNIQUE: Multidetector CT imaging of the abdomen and pelvis was performed using the standard protocol following bolus administration of intravenous contrast. CONTRAST:  1 ISOVUE-300 IOPAMIDOL (ISOVUE-300) INJECTION 61% COMPARISON:  07/03/2015 FINDINGS: Lung bases are clear.  No pleural or pericardial fluid. Advanced fatty change of the liver remains evident. There is been previous cholecystectomy. The spleen is normal. The pancreatic parenchyma is normal. The pancreatic duct is slightly prominent. No sign of pancreatitis. Multiple surgical clips in the region of the head of the pancreas in the distal common duct. The adrenal glands are normal. Renal collecting systems are slightly prominent, but this does not appear to be on the basis of obstruction of the  ureters. There is a benign appearing renal cyst at the upper pole on the left. The aorta is tortuous but there is no aneurysm. The IVC is normal. The bladder is markedly distended, probably the etiology of the renal collecting system fullness. No evidence of pelvic soft tissue mass. No acute bowel pathology is seen. No free fluid or air. Old spinal fractures with previous augmentation. IMPRESSION: Markedly distended bladder, presumably neurogenic. Fullness of the renal collecting systems, probably secondary to that. Advanced fatty liver as seen previously. Electronically Signed   By: Nelson Chimes M.D.   On: 07/07/2015 20:39   Ct Abdomen Pelvis W Contrast  07/03/2015  CLINICAL DATA:  Acute on chronic abdominal pain with nausea and vomiting EXAM: CT ABDOMEN AND PELVIS WITH CONTRAST TECHNIQUE: Multidetector CT imaging of  the abdomen and pelvis was performed using the standard protocol following bolus administration of intravenous contrast. CONTRAST:  170mL ISOVUE-300 IOPAMIDOL (ISOVUE-300) INJECTION 61% COMPARISON:  05/28/2015 FINDINGS: Lung bases are free of acute infiltrate or sizable effusion. The liver is diffusely fatty infiltrated. The gallbladder has been surgically removed. Prominence of the biliary tree is noted related to the post cholecystectomy state. The spleen, adrenal glands and pancreas are within normal limits with the exception of some mild dilatation of the central pancreatic duct. This has increased slightly in the interval from the prior exam. The kidneys demonstrate cystic change on the left. No renal calculi or obstructive changes are seen. The bladder is again well distended. No pelvic mass lesion is seen. The uterus has been surgically removed. The appendix has also been surgically removed. Left hip prosthesis is noted. Stable compression deformity of T12 is noted with increase kyphosis. Changes of prior vertebral augmentation at L3 and L5 are noted. No acute bony abnormality is noted  IMPRESSION: Fatty liver. Prominence of the common bile duct and pancreatic duct. The pancreatic duct prominence has increased slightly in the interval from the prior exam. This is of uncertain significance. Chronic changes as described above no acute abnormality noted. Electronically Signed   By: Inez Catalina M.D.   On: 07/03/2015 12:20   Dg Abd Acute W/chest  07/07/2015  CLINICAL DATA:  Abdominal pain for 1 day.  History of pancreatitis EXAM: DG ABDOMEN ACUTE W/ 1V CHEST COMPARISON:  Chest radiograph October 21, 2012; abdominal radiograph October 22, 2012; CT abdomen and pelvis July 03, 2015 FINDINGS: PA chest: There is no edema or consolidation. Heart size and pulmonary vascularity are normal. Aorta is mildly tortuous but stable. Patient is undergone several kyphoplasty procedures in the mid thoracic region. Supine and upright abdomen: There is moderate stool throughout colon. There is no appreciable bowel dilatation or air-fluid level suggesting obstruction. No free air. There are multiple surgical clips in the right and left abdomen regions. Patient is undergone kyphoplasty procedures at L3 and L5. There is a total hip prosthesis on the left. IMPRESSION: No bowel obstruction or free air. Moderate stool in colon. Postoperative change at multiple sites. No lung edema or consolidation. Electronically Signed   By: Lowella Grip III M.D.   On: 07/07/2015 17:27   Dg Ugi W/small Bowel High Density  06/10/2015  CLINICAL DATA:  60 year old female with epigastric pain common nausea and vomiting for the past 3 days. 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 Additional history of small-bowel obstruction status post partial small bowel resection 2 years ago. EXAM: UPPER GI SERIES WITH SMALL BOWEL FOLLOW-THROUGH FLUOROSCOPY TIME:  If the device does not provide the exposure index: Fluoroscopy Time (in minutes and seconds):  2 minutes and 54 seconds Number of Acquired Images:  28 TECHNIQUE:  Combined double contrast and single contrast upper GI series using effervescent crystals, thick barium, and thin barium. Subsequently, serial images of the small bowel were obtained including spot views of the terminal ileum. COMPARISON:  No priors. FINDINGS: Preprocedure KUB demonstrated gas and stool throughout the colon extending to the distal rectum. No pathologic dilatation of small bowel. Suture line in the left side of the abdomen. Numerous surgical clips in the right upper quadrant from prior cholecystectomy. Surgical clip projecting over the right side of the pelvis. Postprocedural changes of vertebroplasty at L3 and L5. Double contrast images of the esophagus demonstrated a normal appearance of the esophageal mucosa. No esophageal mass,  stricture or esophageal ring. No hiatal hernia. Esophageal motility was normal. Double contrast images of the stomach are limited secondary to lack of patient mobility, and inability to adequately coat the entire stomach. Postoperative changes of prior Roux-en-Y gastrectomy are noted. Throughout the remaining portions of the stomach there were several areas of subtle nodularity, that could represent small gastric polyps (although some of these findings may be attributable to small gas bubbles in poorly mixed and poorly distributed barium). No definite gastric mass or ulcer was identified. Remaining portions of small bowel were normal in appearance, without focal mucosal abnormality, definite stricture or definite mass. No pathologic dilatation of small bowel. Terminal ileum was normal in appearance. IMPRESSION: 1. No evidence of recurrent bowel obstruction. 2. No acute findings to account for the patient's symptoms. 3. Multiple minor mucosal abnormalities in the stomach that may suggest the presence of multiple small hyperplastic polyps. 4. Normal appearance of the esophagus. Electronically Signed   By: Vinnie Langton M.D.   On: 06/10/2015 14:16     Microbiology: Recent Results (from the past 240 hour(s))  Urine culture     Status: None   Collection Time: 07/03/15  2:25 PM  Result Value Ref Range Status   Specimen Description URINE, CLEAN CATCH  Final   Special Requests NONE  Final   Culture MULTIPLE SPECIES PRESENT, SUGGEST RECOLLECTION  Final   Report Status 07/05/2015 FINAL  Final  C difficile quick scan w PCR reflex     Status: None   Collection Time: 07/03/15  2:26 PM  Result Value Ref Range Status   C Diff antigen NEGATIVE NEGATIVE Final   C Diff toxin NEGATIVE NEGATIVE Final   C Diff interpretation Negative for toxigenic C. difficile  Final  Gastrointestinal Panel by PCR , Stool     Status: None   Collection Time: 07/03/15  2:26 PM  Result Value Ref Range Status   Campylobacter species NOT DETECTED NOT DETECTED Final   Plesimonas shigelloides NOT DETECTED NOT DETECTED Final   Salmonella species NOT DETECTED NOT DETECTED Final   Yersinia enterocolitica NOT DETECTED NOT DETECTED Final   Vibrio species NOT DETECTED NOT DETECTED Final   Vibrio cholerae NOT DETECTED NOT DETECTED Final   Enteroaggregative E coli (EAEC) NOT DETECTED NOT DETECTED Final   Enteropathogenic E coli (EPEC) NOT DETECTED NOT DETECTED Final   Enterotoxigenic E coli (ETEC) NOT DETECTED NOT DETECTED Final   Shiga like toxin producing E coli (STEC) NOT DETECTED NOT DETECTED Final   E. coli O157 NOT DETECTED NOT DETECTED Final   Shigella/Enteroinvasive E coli (EIEC) NOT DETECTED NOT DETECTED Final   Cryptosporidium NOT DETECTED NOT DETECTED Final   Cyclospora cayetanensis NOT DETECTED NOT DETECTED Final   Entamoeba histolytica NOT DETECTED NOT DETECTED Final   Giardia lamblia NOT DETECTED NOT DETECTED Final   Adenovirus F40/41 NOT DETECTED NOT DETECTED Final   Astrovirus NOT DETECTED NOT DETECTED Final   Norovirus GI/GII NOT DETECTED NOT DETECTED Final   Rotavirus A NOT DETECTED NOT DETECTED Final   Sapovirus (I, II, IV, and V) NOT DETECTED NOT  DETECTED Final     Labs: Basic Metabolic Panel:  Recent Labs Lab 07/02/15 2230 07/02/15 2237 07/04/15 0621 07/05/15 0607 07/07/15 1623 07/08/15 0544  NA  --  140 142 140 135 141  K  --  3.4* 4.3 3.7 3.3* 3.5  CL  --  107 109 106 101 106  CO2  --  24 25 27 22 27   GLUCOSE  --  103* 89 111* 144* 88  BUN  --  10 <5* <5* 5* <5*  CREATININE  --  0.70 0.39* 0.48 0.53 0.45  CALCIUM  --  8.9 8.6* 8.5* 9.2 9.1  MG  --   --  1.6*  --   --   --   PHOS 3.4  --   --   --   --   --    Liver Function Tests:  Recent Labs Lab 07/02/15 2237 07/04/15 0621 07/05/15 0607 07/07/15 1623 07/08/15 0544  AST 25 24 27  34 27  ALT 15 13* 13* 16 14  ALKPHOS 120 99 106 142* 115  BILITOT 0.4 0.3 0.4 0.2* 0.5  PROT 6.8 5.8* 6.0* 8.0 6.6  ALBUMIN 3.8 3.2* 3.3* 4.4 3.6    Recent Labs Lab 07/02/15 2237 07/07/15 1623  LIPASE 27 23   No results for input(s): AMMONIA in the last 168 hours. CBC:  Recent Labs Lab 07/02/15 2237 07/07/15 1623 07/08/15 0544  WBC 5.0 7.3 5.5  NEUTROABS 2.0 5.4  --   HGB 10.7* 11.6* 10.6*  HCT 32.9* 36.5 33.4*  MCV 79.9 80.2 80.9  PLT 374 395 347   Cardiac Enzymes: No results for input(s): CKTOTAL, CKMB, CKMBINDEX, TROPONINI in the last 168 hours. BNP: BNP (last 3 results) No results for input(s): BNP in the last 8760 hours.  ProBNP (last 3 results) No results for input(s): PROBNP in the last 8760 hours.  CBG:  Recent Labs Lab 07/03/15 1223 07/04/15 0759 07/05/15 0746  GLUCAP 90 80 83       Signed:  [facility] Pager: [phone] 07/09/2015, 6:32 PM

## 2015-07-09 NOTE — Telephone Encounter (Signed)
Noted. Thanks.

## 2015-07-09 NOTE — Progress Notes (Signed)
Discharge instructions read to patient.  Patient verbalized understanding of all instructions. 

## 2015-07-09 NOTE — Progress Notes (Signed)
Subjective:  Patient complains of ongoing epigastric pain. Uncontrolled with current pain regimen per patient. Some nausea. BMs ok. Reports several month history of difficulty emptying her bladder, urinary frequency.  Objective: Vital signs in last 24 hours: Temp:  [98 F (36.7 C)-98.6 F (37 C)] 98.6 F (37 C) (04/19 0455) Pulse Rate:  [67-76] 76 (04/19 0455) Resp:  [15-20] 15 (04/19 0455) BP: (121-161)/(85-91) 121/85 mmHg (04/19 0455) SpO2:  [96 %-99 %] 99 % (04/19 0455) Weight:  [103 lb 8 oz (46.947 kg)] 103 lb 8 oz (46.947 kg) (04/19 0455) Last BM Date: 07/07/15 General:   Alert,  Well-developed, well-nourished, pleasant and cooperative in NAD Head:  Normocephalic and atraumatic. Eyes:  Sclera clear, no icterus.  Chest: CTA bilaterally without rales, rhonchi, crackles.    Heart:  Regular rate and rhythm; no murmurs, clicks, rubs,  or gallops. Abdomen:  Soft, moderate epigastric tenderness and nondistended. No masses, hepatosplenomegaly noted. Normal bowel sounds, without guarding, and without rebound. Small easily reducible incisional hernia inferior to her pain.   Extremities:  Without clubbing, deformity or edema. Neurologic:  Alert and  oriented x4;  grossly normal neurologically. Skin:  Intact without significant lesions or rashes. Psych:  Alert and cooperative. Normal mood and affect.  Intake/Output from previous day: 04/18 0701 - 04/19 0700 In: 1080 [P.O.:1080] Out: 1500 [Urine:1500] Intake/Output this shift:    Lab Results: CBC  Recent Labs  07/07/15 1623 07/08/15 0544  WBC 7.3 5.5  HGB 11.6* 10.6*  HCT 36.5 33.4*  MCV 80.2 80.9  PLT 395 347   BMET  Recent Labs  07/07/15 1623 07/08/15 0544  NA 135 141  K 3.3* 3.5  CL 101 106  CO2 22 27  GLUCOSE 144* 88  BUN 5* <5*  CREATININE 0.53 0.45  CALCIUM 9.2 9.1   LFTs  Recent Labs  07/07/15 1623 07/08/15 0544  BILITOT 0.2* 0.5  ALKPHOS 142* 115  AST 34 27  ALT 16 14  PROT 8.0 6.6  ALBUMIN 4.4  3.6    Recent Labs  07/07/15 1623  LIPASE 23   PT/INR No results for input(s): LABPROT, INR in the last 72 hours.    Imaging Studies: Ct Abdomen Pelvis W Contrast  07/07/2015  CLINICAL DATA:  Worsening abdominal pain.  Elevated lactate. EXAM: CT ABDOMEN AND PELVIS WITH CONTRAST TECHNIQUE: Multidetector CT imaging of the abdomen and pelvis was performed using the standard protocol following bolus administration of intravenous contrast. CONTRAST:  1 ISOVUE-300 IOPAMIDOL (ISOVUE-300) INJECTION 61% COMPARISON:  07/03/2015 FINDINGS: Lung bases are clear.  No pleural or pericardial fluid. Advanced fatty change of the liver remains evident. There is been previous cholecystectomy. The spleen is normal. The pancreatic parenchyma is normal. The pancreatic duct is slightly prominent. No sign of pancreatitis. Multiple surgical clips in the region of the head of the pancreas in the distal common duct. The adrenal glands are normal. Renal collecting systems are slightly prominent, but this does not appear to be on the basis of obstruction of the ureters. There is a benign appearing renal cyst at the upper pole on the left. The aorta is tortuous but there is no aneurysm. The IVC is normal. The bladder is markedly distended, probably the etiology of the renal collecting system fullness. No evidence of pelvic soft tissue mass. No acute bowel pathology is seen. No free fluid or air. Old spinal fractures with previous augmentation. IMPRESSION: Markedly distended bladder, presumably neurogenic. Fullness of the renal collecting systems, probably secondary to that. Advanced  fatty liver as seen previously. Electronically Signed   By: Nelson Chimes M.D.   On: 07/07/2015 20:39   Ct Abdomen Pelvis W Contrast  07/03/2015  CLINICAL DATA:  Acute on chronic abdominal pain with nausea and vomiting EXAM: CT ABDOMEN AND PELVIS WITH CONTRAST TECHNIQUE: Multidetector CT imaging of the abdomen and pelvis was performed using the standard  protocol following bolus administration of intravenous contrast. CONTRAST:  17mL ISOVUE-300 IOPAMIDOL (ISOVUE-300) INJECTION 61% COMPARISON:  05/28/2015 FINDINGS: Lung bases are free of acute infiltrate or sizable effusion. The liver is diffusely fatty infiltrated. The gallbladder has been surgically removed. Prominence of the biliary tree is noted related to the post cholecystectomy state. The spleen, adrenal glands and pancreas are within normal limits with the exception of some mild dilatation of the central pancreatic duct. This has increased slightly in the interval from the prior exam. The kidneys demonstrate cystic change on the left. No renal calculi or obstructive changes are seen. The bladder is again well distended. No pelvic mass lesion is seen. The uterus has been surgically removed. The appendix has also been surgically removed. Left hip prosthesis is noted. Stable compression deformity of T12 is noted with increase kyphosis. Changes of prior vertebral augmentation at L3 and L5 are noted. No acute bony abnormality is noted IMPRESSION: Fatty liver. Prominence of the common bile duct and pancreatic duct. The pancreatic duct prominence has increased slightly in the interval from the prior exam. This is of uncertain significance. Chronic changes as described above no acute abnormality noted. Electronically Signed   By: Inez Catalina M.D.   On: 07/03/2015 12:20   Dg Abd Acute W/chest  07/07/2015  CLINICAL DATA:  Abdominal pain for 1 day.  History of pancreatitis EXAM: DG ABDOMEN ACUTE W/ 1V CHEST COMPARISON:  Chest radiograph October 21, 2012; abdominal radiograph October 22, 2012; CT abdomen and pelvis July 03, 2015 FINDINGS: PA chest: There is no edema or consolidation. Heart size and pulmonary vascularity are normal. Aorta is mildly tortuous but stable. Patient is undergone several kyphoplasty procedures in the mid thoracic region. Supine and upright abdomen: There is moderate stool throughout colon.  There is no appreciable bowel dilatation or air-fluid level suggesting obstruction. No free air. There are multiple surgical clips in the right and left abdomen regions. Patient is undergone kyphoplasty procedures at L3 and L5. There is a total hip prosthesis on the left. IMPRESSION: No bowel obstruction or free air. Moderate stool in colon. Postoperative change at multiple sites. No lung edema or consolidation. Electronically Signed   By: Lowella Grip III M.D.   On: 07/07/2015 17:27   Dg Ugi W/small Bowel High Density  06/10/2015  CLINICAL DATA:  60 year old female with epigastric pain common nausea and vomiting for the past 3 days. 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 Additional history of small-bowel obstruction status post partial small bowel resection 2 years ago. EXAM: UPPER GI SERIES WITH SMALL BOWEL FOLLOW-THROUGH FLUOROSCOPY TIME:  If the device does not provide the exposure index: Fluoroscopy Time (in minutes and seconds):  2 minutes and 54 seconds Number of Acquired Images:  28 TECHNIQUE: Combined double contrast and single contrast upper GI series using effervescent crystals, thick barium, and thin barium. Subsequently, serial images of the small bowel were obtained including spot views of the terminal ileum. COMPARISON:  No priors. FINDINGS: Preprocedure KUB demonstrated gas and stool throughout the colon extending to the distal rectum. No pathologic dilatation of small bowel. Suture line  in the left side of the abdomen. Numerous surgical clips in the right upper quadrant from prior cholecystectomy. Surgical clip projecting over the right side of the pelvis. Postprocedural changes of vertebroplasty at L3 and L5. Double contrast images of the esophagus demonstrated a normal appearance of the esophageal mucosa. No esophageal mass, stricture or esophageal ring. No hiatal hernia. Esophageal motility was normal. Double contrast images of the stomach are limited  secondary to lack of patient mobility, and inability to adequately coat the entire stomach. Postoperative changes of prior Roux-en-Y gastrectomy are noted. Throughout the remaining portions of the stomach there were several areas of subtle nodularity, that could represent small gastric polyps (although some of these findings may be attributable to small gas bubbles in poorly mixed and poorly distributed barium). No definite gastric mass or ulcer was identified. Remaining portions of small bowel were normal in appearance, without focal mucosal abnormality, definite stricture or definite mass. No pathologic dilatation of small bowel. Terminal ileum was normal in appearance. IMPRESSION: 1. No evidence of recurrent bowel obstruction. 2. No acute findings to account for the patient's symptoms. 3. Multiple minor mucosal abnormalities in the stomach that may suggest the presence of multiple small hyperplastic polyps. 4. Normal appearance of the esophagus. Electronically Signed   By: Vinnie Langton M.D.   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 She has had multiple abdominal procedures secondary to complications. Multiple admissions here at [facility] and also admitted at [facility] last month, as she states she was "told to go there if any further problems". Presenting again with acute on chronic abdominal pain, nausea, and vomiting. EGD up-to-date and unremarkable, as of March 2017. [facility] has recommended consideration for celiac plexus nerve block as an outpatient, along with continuing Topamax and Cymbalta. She is on Elavil as well. seen by Dr. Arnoldo Morale yesterday, exploratory surgery not advised.  Differentials including adhesive disease, internal hernia, less likely biliary or pancreatic source. High suspicion for chronic abdominal pain and anxiety playing  a role. Her weight has remained overall stable over the years, as she tends to stay in the low 100 range. Options limited at this time from our standpoint, as she has been evaluated extensively.   Patient continues to complain of uncontrolled pain. She consumed 100% of her meals yesterday (clear/fulls).   CT this admission with markedly distended bladder with fullness of the renal collecting systems. Bladder scan in ER with 200cc after urination. Patient reports several month history of urinary frequency and feeling like she cannot empty her bladder well. No lower abdominal pain. Bladder distended on last three CTs.  Plan: 1. Bladder issues likely not source of her upper abdominal pain but would consider outpatient urology consultation. 2. Outpatient MRCP in open MRI Monday April 24th. 3. Outpatient OV at [facility] to assess additional management options for chronic epigastric pain. 4. Increased elavil to 20mg  qhs. Continue scheduled zofran and reglan. PPI BID. 5. Pain management per attending.  Laureen Ochs. Bernarda Caffey [facility] [phone] 4/19/20178:40 AM

## 2015-07-09 NOTE — Telephone Encounter (Signed)
Spoke with [place] [facility] and pt is set for MRCP on 04/24 @ 7:00pm.  [facility] called and changed pt appt to 05/01 @ 3:00pm with Dr. Virgia Land. Gave appts to [place].

## 2015-07-09 NOTE — Telephone Encounter (Signed)
Referral sent to [facility]. Pt has appt on 04/27 @ 1230pm.  Spoke with patients nurse Tedd Sias and she will give pt the appt information.    Called [place] [facility] regarding pt MRI and spoke with Mardene Celeste. States that they called pt and she is refusing. Explained to patient that is was an open MRI and pt still refused.

## 2015-07-09 NOTE — Care Management Note (Signed)
Case Management Note  Patient Details  Name: LINH DERUYTER MRN: [id number] Date of Birth: 1955-06-04  Subjective/Objective:                  Pt is from home, lives alone and is ind with ADL's. Pt has support at home, transportation and no difficulty obtaining her medications. Pt plans to return home with self care. Pt will have close GI follow-up.   Action/Plan: No CM needs anticipated.   Expected Discharge Date:     07/09/2015             Expected Discharge Plan:  Home/Self Care  In-House Referral:  NA  Discharge planning Services  CM Consult  Post Acute Care Choice:  NA Choice offered to:  NA  DME Arranged:    DME Agency:     HH Arranged:    HH Agency:     Status of Service:  Completed, signed off  Medicare Important Message Given:    Date Medicare IM Given:    Medicare IM give by:    Date Additional Medicare IM Given:    Additional Medicare Important Message give by:     If discussed at [place] of Stay Meetings, dates discussed:    Additional Comments:  Sherald Barge, RN 07/09/2015, 2:34 PM

## 2015-07-10 NOTE — Telephone Encounter (Signed)
REVIEWED-NO ADDITIONAL RECOMMENDATIONS. 

## 2015-07-14 ENCOUNTER — Inpatient Hospital Stay: Admit: 2015-07-14

## 2015-07-21 ENCOUNTER — Telehealth: Payer: Self-pay

## 2015-07-21 NOTE — Telephone Encounter (Signed)
PLEASE CALL PT. SHE CAN TAKE ONE OR TWO OF HER XANAX PRIOR TO THE MRI TO HELP HER RELAX.Marland Kitchen

## 2015-07-21 NOTE — Telephone Encounter (Signed)
Faxed Dr. Arnoldo Morale referral per [facility] request. Pt can't be scheduled until MRI is complete. Pt had appt at [facility] on 04/24 and patient was a no show per [facility].   Please advise

## 2015-07-21 NOTE — Telephone Encounter (Signed)
Pt is aware and is set for MRI on 05/10 with [facility]

## 2015-07-21 NOTE — Telephone Encounter (Signed)
Called pt and asked her why she didn't show for her MRI on 04/24. She states that they wouldn't give her any medication for the exam.  Routing to [facility]!

## 2015-07-23 ENCOUNTER — Encounter (HOSPITAL_COMMUNITY): Payer: Self-pay | Admitting: Emergency Medicine

## 2015-07-23 ENCOUNTER — Emergency Department (HOSPITAL_COMMUNITY)
Admission: EM | Admit: 2015-07-23 | Discharge: 2015-07-23 | Disposition: A | Discharge status: Home / Self Care | Attending: Emergency Medicine | Admitting: Emergency Medicine

## 2015-07-23 DIAGNOSIS — R112 Nausea with vomiting, unspecified: Secondary | ICD-10-CM | POA: Insufficient documentation

## 2015-07-23 DIAGNOSIS — M069 Rheumatoid arthritis, unspecified: Secondary | ICD-10-CM | POA: Diagnosis not present

## 2015-07-23 DIAGNOSIS — I10 Essential (primary) hypertension: Secondary | ICD-10-CM | POA: Diagnosis not present

## 2015-07-23 DIAGNOSIS — G8929 Other chronic pain: Secondary | ICD-10-CM

## 2015-07-23 DIAGNOSIS — N390 Urinary tract infection, site not specified: Secondary | ICD-10-CM

## 2015-07-23 DIAGNOSIS — Z79899 Other long term (current) drug therapy: Secondary | ICD-10-CM | POA: Diagnosis not present

## 2015-07-23 DIAGNOSIS — R109 Unspecified abdominal pain: Secondary | ICD-10-CM | POA: Diagnosis present

## 2015-07-23 LAB — URINALYSIS, ROUTINE W REFLEX MICROSCOPIC
BILIRUBIN URINE: NEGATIVE
Glucose, UA: NEGATIVE mg/dL
Hgb urine dipstick: NEGATIVE
KETONES UR: NEGATIVE mg/dL
Leukocytes, UA: NEGATIVE
NITRITE: POSITIVE — AB
Protein, ur: NEGATIVE mg/dL
pH: 6 (ref 5.0–8.0)

## 2015-07-23 LAB — RAPID URINE DRUG SCREEN, HOSP PERFORMED
Amphetamines: NOT DETECTED
Barbiturates: NOT DETECTED
Benzodiazepines: POSITIVE — AB
COCAINE: NOT DETECTED
OPIATES: NOT DETECTED
Tetrahydrocannabinol: NOT DETECTED

## 2015-07-23 LAB — COMPREHENSIVE METABOLIC PANEL
ALK PHOS: 183 U/L — AB (ref 38–126)
ALT: 18 U/L (ref 14–54)
ANION GAP: 10 (ref 5–15)
AST: 29 U/L (ref 15–41)
Albumin: 4.7 g/dL (ref 3.5–5.0)
BUN: 5 mg/dL — ABNORMAL LOW (ref 6–20)
CALCIUM: 9.9 mg/dL (ref 8.9–10.3)
CO2: 31 mmol/L (ref 22–32)
Chloride: 101 mmol/L (ref 101–111)
Creatinine, Ser: 0.68 mg/dL (ref 0.44–1.00)
GFR calc non Af Amer: >60 mL/min (ref >60)
Glucose, Bld: 97 mg/dL (ref 65–99)
POTASSIUM: 3.6 mmol/L (ref 3.5–5.1)
Sodium: 142 mmol/L (ref 135–145)
TOTAL PROTEIN: 8.5 g/dL — AB (ref 6.5–8.1)
Total Bilirubin: 0.3 mg/dL (ref 0.3–1.2)

## 2015-07-23 LAB — CBC WITH DIFFERENTIAL/PLATELET
BASOS ABS: 0.1 10*3/uL (ref 0.0–0.1)
Basophils Relative: 2 %
EOS PCT: 4 %
Eosinophils Absolute: 0.2 10*3/uL (ref 0.0–0.7)
HEMATOCRIT: 39.8 % (ref 36.0–46.0)
HEMOGLOBIN: 12.3 g/dL (ref 12.0–15.0)
LYMPHS PCT: 36 %
Lymphs Abs: 2 10*3/uL (ref 0.7–4.0)
MCH: 25.4 pg — ABNORMAL LOW (ref 26.0–34.0)
MCHC: 30.9 g/dL (ref 30.0–36.0)
MCV: 82.1 fL (ref 78.0–100.0)
Monocytes Absolute: 0.4 10*3/uL (ref 0.1–1.0)
Monocytes Relative: 7 %
NEUTROS ABS: 2.9 10*3/uL (ref 1.7–7.7)
NEUTROS PCT: 51 %
PLATELETS: 314 10*3/uL (ref 150–400)
RBC: 4.85 MIL/uL (ref 3.87–5.11)
RDW: 14.6 % (ref 11.5–15.5)
WBC: 5.6 10*3/uL (ref 4.0–10.5)

## 2015-07-23 LAB — URINE MICROSCOPIC-ADD ON

## 2015-07-23 LAB — LIPASE, BLOOD: Lipase: 18 U/L (ref 11–51)

## 2015-07-23 MED ORDER — CEPHALEXIN 500 MG PO CAPS: 500.0000 mg | ORAL_CAPSULE | Freq: Four times a day (QID) | ORAL | Status: DC

## 2015-07-23 MED ORDER — ONDANSETRON HCL 4 MG/2ML IJ SOLN
4.0000 mg | Freq: Once | INTRAMUSCULAR | Status: AC
  Administered 2015-07-23: 4 mg via INTRAVENOUS
  Filled 2015-07-23: qty 2

## 2015-07-23 MED ORDER — GI COCKTAIL ~~LOC~~
30.0000 mL | Freq: Once | ORAL | Status: AC
  Administered 2015-07-23: 30 mL via ORAL
  Filled 2015-07-23: qty 30

## 2015-07-23 MED ORDER — PANTOPRAZOLE SODIUM 40 MG IV SOLR
40.0000 mg | Freq: Once | INTRAVENOUS | Status: AC
  Administered 2015-07-23: 40 mg via INTRAVENOUS
  Filled 2015-07-23: qty 40

## 2015-07-23 MED ORDER — SODIUM CHLORIDE 0.9 % IV BOLUS (SEPSIS)
1000.0000 mL | Freq: Once | INTRAVENOUS | Status: AC
  Administered 2015-07-23: 1000 mL via INTRAVENOUS

## 2015-07-23 MED ORDER — CEPHALEXIN 500 MG PO CAPS
500.0000 mg | ORAL_CAPSULE | Freq: Once | ORAL | Status: AC
  Administered 2015-07-23: 500 mg via ORAL
  Filled 2015-07-23: qty 1

## 2015-07-23 MED ORDER — HYDROMORPHONE HCL 1 MG/ML IJ SOLN
1.0000 mg | Freq: Once | INTRAMUSCULAR | Status: AC
Start: 2015-07-23 — End: 2015-07-23
  Administered 2015-07-23: 1 mg via INTRAVENOUS
  Filled 2015-07-23: qty 1

## 2015-07-23 MED ORDER — ONDANSETRON 4 MG PO TBDP: 4.0000 mg | ORAL_TABLET | Freq: Three times a day (TID) | ORAL | Status: DC | PRN

## 2015-07-23 NOTE — ED Notes (Signed)
Pt c/o chronic pancreatitis pain with n/v/d.

## 2015-07-23 NOTE — ED Provider Notes (Signed)
TIME SEEN: 3:00 AM  CHIEF COMPLAINT: Chronic abdominal pain  HPI: Pt is a 60 y.o. female with history of rheumatoid arthritis, osteoporosis, reported history of chronic pancreatitis, reported history of partial gastrectomy secondary to an ulcer (after use of NSAIDs) who presents emergency department with pain above her umbilicus. States she has had this pain intermittently for several months. States she has had nausea vomiting poor it reports anorexia and that it is painful to eat. Has been admitted to the hospital for intractable pain with no clear etiology. She is followed with Dr. Oneida Alar with gastroenterology and is supposed to see Dr. Arnoldo Morale with general surgery next week. She reports she is status post appendectomy, hysterectomy, bilateral salpingo-oophorectomy, cholecystectomy. She was scheduled for an MRCP last week which she missed. States it is rescheduled for next week. She denies a chest pain or shortness of breath. No fevers or chills. No bloody stools or melena. No dysuria, hematuria. No vaginal bleeding or discharge.  ROS: See HPI Constitutional: no fever  Eyes: no drainage  ENT: no runny nose   Cardiovascular:  no chest pain  Resp: no SOB  GI: no vomiting GU: no dysuria Integumentary: no rash  Allergy: no hives  Musculoskeletal: no leg swelling  Neurological: no slurred speech ROS otherwise negative  PAST MEDICAL HISTORY/PAST SURGICAL HISTORY:  Past Medical History  Diagnosis Date  . RA (rheumatoid arthritis) ([place])  2008  . Constipation 03/21/2011  . Hypokalemia 03/21/2011  . Fatty liver 03/21/2011  . Pancreatitis     states in remote past, very severe, ?biliary pancreatitis   . T12 compression fracture ([place]) 2011  . Duodenal ulcer     remote per patient. +BC powders, patient report negative EGD six months ago  . Dilated pancreatic duct ([place])     ?chronic pancreatitis   . Kidney stones   . Chronic abdominal pain   . Pneumonia     as child  . Anemia   . Lumbar  stress fracture   . Anxiety   . Essential hypertension 06/09/2015    MEDICATIONS:  Prior to Admission medications   Medication Sig Start Date End Date Taking? Authorizing Provider  ALPRAZolam Duanne Moron) 0.5 MG tablet Take 0.5 mg by mouth 2 (two) times daily.    Historical Provider, MD  amitriptyline (ELAVIL) 10 MG tablet Take 1 tablet (10 mg total) by mouth at bedtime. 07/05/15   Orvan Falconer, MD  DULoxetine (CYMBALTA) 30 MG capsule Take 30 mg by mouth daily.  06/27/15   Historical Provider, MD  feeding supplement (BOOST / RESOURCE BREEZE) LIQD Take 1 Container by mouth 2 (two) times daily between meals. 06/17/15   Kathie Dike, MD  lisinopril (PRINIVIL,ZESTRIL) 20 MG tablet Take 20 mg by mouth daily.    Historical Provider, MD  ondansetron (ZOFRAN ODT) 4 MG disintegrating tablet Take 1 tablet (4 mg total) by mouth every 6 (six) hours as needed for nausea or vomiting. 06/17/15   Kathie Dike, MD  oxyCODONE-acetaminophen (PERCOCET/ROXICET) 5-325 MG tablet Take 1 tablet by mouth every 4 (four) hours as needed for moderate pain or severe pain. 07/09/15   Erline Hau, MD  pantoprazole (PROTONIX) 40 MG tablet Take 1 tablet (40 mg total) by mouth 2 (two) times daily. 06/02/15   Kathie Dike, MD  senna (SENOKOT) 8.6 MG TABS tablet Take 2 tablets (17.2 mg total) by mouth 2 (two) times daily. 06/02/15   Kathie Dike, MD  topiramate (TOPAMAX) 50 MG tablet Take 50 mg by mouth daily.  06/27/15   Historical Provider, MD    ALLERGIES:  Allergies  Allergen Reactions  . Compazine Anaphylaxis  . Phenergan [Promethazine] Anaphylaxis  . Vistaril [Hydroxyzine Hcl] Other (See Comments)    Reaction: legs shake  . Benadryl [Diphenhydramine Hcl] Other (See Comments)    Pt states, "it makes my legs shake really bad"   . Gabapentin Other (See Comments)    Reactions: legs shake  . Latex Swelling    SOCIAL HISTORY:  Social History  Substance Use Topics  . Smoking status: Never Smoker   . Smokeless  tobacco: Never Used  . Alcohol Use: No    FAMILY HISTORY: Family History  Problem Relation Age of Onset  . Colon cancer Neg Hx   . Liver cancer Neg Hx   . Breast cancer Neg Hx   . Inflammatory bowel disease Neg Hx   . Heart attack Mother     EXAM: BP 131/97 mmHg  Pulse 87  Temp(Src) 98 F (36.7 C)  Resp 20  Ht 5\' 3"  (1.6 m)  Wt 110 lb (49.896 kg)  BMI 19.49 kg/m2  SpO2 97% CONSTITUTIONAL: Alert and oriented and responds appropriately to questions. Chronically ill-appearing, in no distress, appears drowsy but is arousable HEAD: Normocephalic EYES: Conjunctivae clear, PERRL ENT: normal nose; no rhinorrhea; moist mucous membranes NECK: Supple, no meningismus, no LAD  CARD: RRR; S1 and S2 appreciated; no murmurs, no clicks, no rubs, no gallops RESP: Normal chest excursion without splinting or tachypnea; breath sounds clear and equal bilaterally; no wheezes, no rhonchi, no rales, no hypoxia or respiratory distress, speaking full sentences ABD/GI: Normal bowel sounds; non-distended; soft, minimally tender in the upper abdomen around the umbilicus but when distracted her abdominal exam is completely benign, no rebound, no guarding, no peritoneal signs BACK:  The back appears normal and is non-tender to palpation, there is no CVA tenderness EXT: Normal ROM in all joints; non-tender to palpation; no edema; normal capillary refill; no cyanosis, no calf tenderness or swelling    SKIN: Normal color for age and race; warm; no rash NEURO: Moves all extremities equally, sensation to light touch intact diffusely, cranial nerves II through XII intact, normal gait PSYCH: The patient's mood and manner are appropriate. Grooming and personal hygiene are appropriate.  MEDICAL DECISION MAKING: Patient here with chronic abdominal pain. Has had an extensive workup per her report and has outpatient follow-up with a gastroenterologist as well as a general surgeon next week. Abdominal exam when distracted  is benign. She does not appear dehydrated. She appears hemodynamically stable and is afebrile, nontoxic. Will obtain abdominal labs, urine. She just had a CT scan of her abdomen and pelvis on April 17 which was unremarkable. I do not feel this needs to be repeated at this time. We'll give IV fluids, pain and nausea medicine and reassess.  ED PROGRESS: Patient's labs have been unremarkable other than mildly elevated alkaline phosphatase. Her urine is nitrite positive with bacteria. We'll send urine culture and treat her possible UTI with Keflex. She appears much more comfortable after a dose of IV Dilaudid. No vomiting in the emergency department. She is able to drink without difficulty. Discussed with her that I feel she is safe to be discharged home and she is comfortable with this plan. Discussed with her return precautions. She verbalized understanding.    At this time, I do not feel there is any life-threatening condition present. I have reviewed and discussed all results (EKG, imaging, lab, urine as appropriate), exam findings  with patient. I have reviewed nursing notes and appropriate previous records.  I feel the patient is safe to be discharged home without further emergent workup. Discussed usual and customary return precautions. Patient and family (if present) verbalize understanding and are comfortable with this plan.  Patient will follow-up with their primary care provider. If they do not have a primary care provider, information for follow-up has been provided to them. All questions have been answered.      [place], DO 07/23/15 [phone]

## 2015-07-23 NOTE — Discharge Instructions (Signed)
Chronic Pain  Chronic pain can be defined as pain that is off and on and lasts for 3-6 months or longer. Many things cause chronic pain, which can make it difficult to make a diagnosis. There are many treatment options available for chronic pain. However, finding a treatment that works well for you may require trying various approaches until the right one is found. Many people benefit from a combination of two or more types of treatment to control their pain.  SYMPTOMS   Chronic pain can occur anywhere in the body and can range from mild to very severe. Some types of chronic pain include:  · Headache.  · Low back pain.  · Cancer pain.  · Arthritis pain.  · Neurogenic pain. This is pain resulting from damage to nerves.   People with chronic pain may also have other symptoms such as:  · Depression.  · Anger.  · Insomnia.  · Anxiety.  DIAGNOSIS   Your health care provider will help diagnose your condition over time. In many cases, the initial focus will be on excluding possible conditions that could be causing the pain. Depending on your symptoms, your health care provider may order tests to diagnose your condition. Some of these tests may include:   · Blood tests.    · CT scan.    · MRI.    · X-rays.    · Ultrasounds.    · Nerve conduction studies.    You may need to see a specialist.   TREATMENT   Finding treatment that works well may take time. You may be referred to a pain specialist. He or she may prescribe medicine or therapies, such as:   · Mindful meditation or yoga.  · Shots (injections) of numbing or pain-relieving medicines into the spine or area of pain.  · Local electrical stimulation.  · Acupuncture.    · Massage therapy.    · Aroma, color, light, or sound therapy.    · Biofeedback.    · Working with a physical therapist to keep from getting stiff.    · Regular, gentle exercise.    · Cognitive or behavioral therapy.    · Group support.    Sometimes, surgery may be recommended.   HOME CARE INSTRUCTIONS    · Take all medicines as directed by your health care provider.    · Lessen stress in your life by relaxing and doing things such as listening to calming music.    · Exercise or be active as directed by your health care provider.    · Eat a healthy diet and include things such as vegetables, fruits, fish, and lean meats in your diet.    · Keep all follow-up appointments with your health care provider.    · Attend a support group with others suffering from chronic pain.  SEEK MEDICAL CARE IF:   · Your pain gets worse.    · You develop a new pain that was not there before.    · You cannot tolerate medicines given to you by your health care provider.    · You have new symptoms since your last visit with your health care provider.    SEEK IMMEDIATE MEDICAL CARE IF:   · You feel weak.    · You have decreased sensation or numbness.    · You lose control of bowel or bladder function.    · Your pain suddenly gets much worse.    · You develop shaking.  · You develop chills.  · You develop confusion.  · You develop chest pain.  · You develop shortness of breath.    MAKE SURE YOU:  ·   Document Revised: 11/08/2012 Document Reviewed: 09/01/2012 °Elsevier Interactive Patient Education ©2016 [employer]vier Inc. ° °Urinary Tract Infection °Urinary tract infections (UTIs) can develop anywhere along your urinary tract. Your urinary tract is your body's drainage system for removing wastes and extra water. Your urinary tract includes two kidneys, two ureters, a bladder, and a urethra. Your kidneys are a pair of bean-shaped organs. Each kidney is about the size of your fist. They are located below your ribs,  one on each side of your spine. °CAUSES °Infections are caused by microbes, which are microscopic organisms, including fungi, viruses, and bacteria. These organisms are so small that they can only be seen through a microscope. Bacteria are the microbes that most commonly cause UTIs. °SYMPTOMS  °Symptoms of UTIs may vary by age and gender of the patient and by the location of the infection. Symptoms in young women typically include a frequent and intense urge to urinate and a painful, burning feeling in the bladder or urethra during urination. Older women and men are more likely to be tired, shaky, and weak and have muscle aches and abdominal pain. A fever may mean the infection is in your kidneys. Other symptoms of a kidney infection include pain in your back or sides below the ribs, nausea, and vomiting. °DIAGNOSIS °To diagnose a UTI, your caregiver will ask you about your symptoms. Your caregiver will also ask you to provide a urine sample. The urine sample will be tested for bacteria and white blood cells. White blood cells are made by your body to help fight infection. °TREATMENT  °Typically, UTIs can be treated with medication. Because most UTIs are caused by a bacterial infection, they usually can be treated with the use of antibiotics. The choice of antibiotic and length of treatment depend on your symptoms and the type of bacteria causing your infection. °HOME CARE INSTRUCTIONS °· If you were prescribed antibiotics, take them exactly as your caregiver instructs you. Finish the medication even if you feel better after you have only taken some of the medication. °· Drink enough water and fluids to keep your urine clear or pale yellow. °· Avoid caffeine, tea, and carbonated beverages. They tend to irritate your bladder. °· Empty your bladder often. Avoid holding urine for long periods of time. °· Empty your bladder before and after sexual intercourse. °· After a bowel movement, women should cleanse from front  to back. Use each tissue only once. °SEEK MEDICAL CARE IF:  °· You have back pain. °· You develop a fever. °· Your symptoms do not begin to resolve within 3 days. °SEEK IMMEDIATE MEDICAL CARE IF:  °· You have severe back pain or lower abdominal pain. °· You develop chills. °· You have nausea or vomiting. °· You have continued burning or discomfort with urination. °MAKE SURE YOU:  °· Understand these instructions. °· Will watch your condition. °· Will get help right away if you are not doing well or get worse. °  °This information is not intended to replace advice given to you by your health care provider. Make sure you discuss any questions you have with your health care provider. °  °Document Released: 12/16/2004 Document Revised: 11/27/2014 Document Reviewed: 04/16/2011 °Elsevier Interactive Patient Education ©2016 Elsevier Inc. ° °

## 2015-07-24 LAB — URINE CULTURE

## 2015-07-29 ENCOUNTER — Telehealth: Payer: Self-pay | Admitting: Gastroenterology

## 2015-07-29 ENCOUNTER — Encounter (HOSPITAL_COMMUNITY): Payer: Self-pay | Admitting: Emergency Medicine

## 2015-07-29 ENCOUNTER — Emergency Department (HOSPITAL_COMMUNITY)
Admission: EM | Admit: 2015-07-29 | Discharge: 2015-07-29 | Disposition: A | Discharge status: Home / Self Care | Attending: Emergency Medicine | Admitting: Emergency Medicine

## 2015-07-29 DIAGNOSIS — R1013 Epigastric pain: Secondary | ICD-10-CM | POA: Insufficient documentation

## 2015-07-29 DIAGNOSIS — G8929 Other chronic pain: Secondary | ICD-10-CM

## 2015-07-29 DIAGNOSIS — I10 Essential (primary) hypertension: Secondary | ICD-10-CM | POA: Diagnosis not present

## 2015-07-29 DIAGNOSIS — R109 Unspecified abdominal pain: Secondary | ICD-10-CM

## 2015-07-29 DIAGNOSIS — Z79899 Other long term (current) drug therapy: Secondary | ICD-10-CM | POA: Diagnosis not present

## 2015-07-29 DIAGNOSIS — R112 Nausea with vomiting, unspecified: Secondary | ICD-10-CM

## 2015-07-29 DIAGNOSIS — Z9889 Other specified postprocedural states: Secondary | ICD-10-CM | POA: Diagnosis not present

## 2015-07-29 MED ORDER — ENALAPRILAT 1.25 MG/ML IV SOLN
1.2500 mg | Freq: Once | INTRAVENOUS | Status: AC
  Administered 2015-07-29: 1.25 mg via INTRAVENOUS
  Filled 2015-07-29: qty 2

## 2015-07-29 MED ORDER — ONDANSETRON HCL 4 MG/2ML IJ SOLN
4.0000 mg | Freq: Once | INTRAMUSCULAR | Status: AC
  Administered 2015-07-29: 4 mg via INTRAVENOUS
  Filled 2015-07-29: qty 2

## 2015-07-29 MED ORDER — HYDROMORPHONE HCL 1 MG/ML IJ SOLN
1.0000 mg | Freq: Once | INTRAMUSCULAR | Status: AC
  Administered 2015-07-29: 1 mg via INTRAVENOUS
  Filled 2015-07-29: qty 1

## 2015-07-29 MED ORDER — METOCLOPRAMIDE HCL 10 MG PO TABS: 10.0000 mg | ORAL_TABLET | Freq: Four times a day (QID) | ORAL | Status: DC

## 2015-07-29 MED ORDER — ONDANSETRON 4 MG PO TBDP: 4.0000 mg | ORAL_TABLET | Freq: Three times a day (TID) | ORAL | Status: AC | PRN

## 2015-07-29 MED ORDER — SODIUM CHLORIDE 0.9 % IV BOLUS (SEPSIS)
1000.0000 mL | Freq: Once | INTRAVENOUS | Status: AC
  Administered 2015-07-29: 1000 mL via INTRAVENOUS

## 2015-07-29 MED ORDER — METOCLOPRAMIDE HCL 5 MG/ML IJ SOLN
10.0000 mg | Freq: Once | INTRAMUSCULAR | Status: AC
  Administered 2015-07-29: 10 mg via INTRAVENOUS
  Filled 2015-07-29: qty 2

## 2015-07-29 NOTE — ED Notes (Signed)
C/o abdominal pain for last 2 days.  Rates pain 8/10.  Took Zofran and Protonix twice without relief.

## 2015-07-29 NOTE — Telephone Encounter (Signed)
Pt said she has had abdominal pain for the last 2 days continuously. The pain is right in the epigastric area and radiates to her back.  She said she had her last BM about 2 days ago, but she normally only has one every 2-3 days.  She has vomited x 2 today, and the Zofran is not helping. She said she feels like her stomach is gong to explode and she rates the pain at an 8 and it has been at that most of the day.   I told her if she is hurting that bad she should go to the ED and she said that she would.

## 2015-07-29 NOTE — Telephone Encounter (Signed)
PLEASE CALL PT. SHE NEEDS COMPLETE HER MRI.  SHE MAY GO TO ED FOR EXACERBATION OF HER PAIN BUT WE CANNOT MOVE FORWARD WITH ADDITIONAL MANAGEMENT UNTIL SHE COMPLETES HER MRI.

## 2015-07-29 NOTE — Telephone Encounter (Signed)
[phone]  Please call patient she called stating that she is having awful stomach pain and nothing she has taken has worked,. Very upset

## 2015-07-29 NOTE — Discharge Instructions (Signed)

## 2015-07-29 NOTE — ED Provider Notes (Signed)
CSN: [id number]     Arrival date & time 07/29/15  1408 History   First MD Initiated Contact with Patient 07/29/15 1439     Chief Complaint  Patient presents with  . Abdominal Pain     (Consider location/radiation/quality/duration/timing/severity/associated sxs/prior Treatment) HPI  The patient is a 60 year old female, she is chronically ill from chronic abdominal pain and possible chronic pancreatitis. 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 She reports having abdominal pain which came on 2 days ago, of note the patient is here frequently with abdominal pain which is difficult to control, she is currently scheduled for an MRI in [facility] tomorrow. She reports she has had several episodes of vomiting today. She is concerned enough about her pain that she did not want to take abdominal pain medication because she thought she would throw it up. The symptoms are persistent, nothing makes it better or worse, the pain radiates to her mid back, this is similar to the pain that she has chronically. She does not state that this is any worse or different in the past chronic pain.  Past Medical History  Diagnosis Date  . RA (rheumatoid arthritis) ([place])  2008  . Constipation 03/21/2011  . Hypokalemia 03/21/2011  . Fatty liver 03/21/2011  . Pancreatitis     states in remote past, very severe, ?biliary pancreatitis   . T12 compression fracture ([place]) 2011  . Duodenal ulcer     remote per patient. +BC powders, patient report negative EGD six months ago  . Dilated pancreatic duct ([place])     ?chronic pancreatitis   . Kidney stones   . Chronic abdominal pain   . Pneumonia     as child  . Anemia   . Lumbar stress fracture   . Anxiety   . Essential hypertension 06/09/2015   Past Surgical History  Procedure Laterality Date  . Cholecystectomy    . Knee surgery    . Appendectomy    . Complete hysterectomy    . Ventral wall hernia    .  Esophagogastroduodenoscopy  08/2010    Dr. Earley Brooke, Versed. small hh, mild prepylori gastritis. path unavailable  . Esophagogastroduodenoscopy  04/2010    Dr. Earley Brooke, Versed. small hh, mild distal esophagitis, antral gastritis and duodenitis. Bx mild chronic gastritis and no H.pylori  . Esophagogastroduodenoscopy  08/2009    Dr. Posey Pronto, Versed. Moderate gastritis, moderate duodenitis with nodularity in proximal duodenal bulb, bx chronic gastritis, no helicobacter, mild chronic duodenitis  . Esophagogastroduodenoscopy  02/2007    Dr. Earley Brooke, Versed. antral gastritis and duodenal ulcers. Bx mild chronic gastritis. No bx from duodenal ulcer available.  [name] dilation  04/06/2011    Procedure: SAVORY DILATION;  Surgeon: Dorothyann Peng, MD;  Location: [facility];  Service: Endoscopy;  Laterality: N/A;  16 mm  . Esophagogastroduodenoscopy  04/06/11    Dr. Oneida Alar: 1-2 cm hiatal hernia, mod gastritis, 39mmX3mm linear ulcer in duodenal bulb, stricture 1st part of duodenum, dilated to 12 mm  . Esophagogastroduodenoscopy  05/12/11    partial gastric oulet obstruction secondary to stricture between bulb/2nd portion of duodenum with marked friability and inflammation but no discrete ulcer, dilated stomach. s/p dilation but high risk for restenosis  . Esophagogastroduodenoscopy  06/22/2011    [id number] ring/Moderate gastritis/PERSISTENT Ulcer in the bulb/descending duodenum Stricture in the bulb/descending duodenum  . Gastrojejunostomy  06/28/2011    Roux-en-Y with choledochoduodenostomy   . Abdominal hysterectomy    . Kidney stone  surgery    . Esophagogastroduodenoscopy Left 06/25/2012    RMR: Normal esophagus. Surgically altered anastomosis to  small intestine noted. Couple of  at the anastomosis.  Patent efferent limb Patient had an oozing 1 cm anastomotic ulcer  . Back surgery  2013  . Colonoscopy N/A 08/16/2012    Dr. Gala Romney: normal but inadequate prep. Consider early interval colonoscopy.   . Cardiac  catheterization      2008  . Esophagogastroduodenoscopy Left 10/23/2012    Dr. Gala Romney: mild erosive reflux esophagitis, s/p hemigastrectomy, prior site of gastric ulcer healed   . Total hip arthroplasty Left 01/2015    in [place], [place]  . Total hip arthroplasty    . Kyphoplasty  Jan 2017    [place], [place]   . Exploratory laparotomy w/ bowel resection      2014, sometime in the summer   . Esophagogastroduodenoscopy N/A 06/11/2015    Dr. Gala Romney: normal esophagus, roux-en-y anastomosis    Family History  Problem Relation Age of Onset  . Colon cancer Neg Hx   . Liver cancer Neg Hx   . Breast cancer Neg Hx   . Inflammatory bowel disease Neg Hx   . Heart attack Mother    Social History  Substance Use Topics  . Smoking status: Never Smoker   . Smokeless tobacco: Never Used  . Alcohol Use: No   OB History    No data available     Review of Systems  All other systems reviewed and are negative.     Allergies  Compazine; Phenergan; Vistaril; Benadryl; Gabapentin; and Latex  Home Medications   Prior to Admission medications   Medication Sig Start Date End Date Taking? Authorizing Provider  amitriptyline (ELAVIL) 10 MG tablet Take 1 tablet (10 mg total) by mouth at bedtime. 07/05/15  Yes Orvan Falconer, MD  DULoxetine (CYMBALTA) 30 MG capsule Take 30 mg by mouth daily.  06/27/15  Yes Historical Provider, MD  lisinopril (PRINIVIL,ZESTRIL) 20 MG tablet Take 20 mg by mouth daily.   Yes Historical Provider, MD  pantoprazole (PROTONIX) 40 MG tablet Take 1 tablet (40 mg total) by mouth 2 (two) times daily. 06/02/15  Yes Kathie Dike, MD  topiramate (TOPAMAX) 50 MG tablet Take 50 mg by mouth daily.  06/27/15  Yes Historical Provider, MD  cephALEXin (KEFLEX) 500 MG capsule Take 1 capsule (500 mg total) by mouth 4 (four) times daily. Patient not taking: Reported on 07/29/2015 07/23/15   Delice Bison Ward, DO  metoCLOPramide (REGLAN) 10 MG tablet Take 1 tablet (10 mg total) by mouth every 6 (six) hours. 07/29/15    Noemi Chapel, MD  ondansetron (ZOFRAN ODT) 4 MG disintegrating tablet Take 1 tablet (4 mg total) by mouth every 8 (eight) hours as needed for nausea. 07/29/15   Noemi Chapel, MD  oxyCODONE-acetaminophen (PERCOCET/ROXICET) 5-325 MG tablet Take 1 tablet by mouth every 4 (four) hours as needed for moderate pain or severe pain. Patient not taking: Reported on 07/29/2015 07/09/15   Erline Hau, MD   BP 165/107 mmHg  Pulse 76  Temp(Src) 99.1 F (37.3 C) (Oral)  Resp 18  Ht 5\' 3"  (1.6 m)  Wt 110 lb (49.896 kg)  BMI 19.49 kg/m2  SpO2 100% Physical Exam  Constitutional: She appears well-developed and well-nourished. No distress.  HENT:  Head: Normocephalic and atraumatic.  Mouth/Throat: Oropharynx is clear and moist. No oropharyngeal exudate.  Eyes: Conjunctivae and EOM are normal. Pupils are equal, round, and reactive to light. Right eye  exhibits no discharge. Left eye exhibits no discharge. No scleral icterus.  Neck: Normal range of motion. Neck supple. No JVD present. No thyromegaly present.  Cardiovascular: Normal rate, regular rhythm, normal heart sounds and intact distal pulses.  Exam reveals no gallop and no friction rub.   No murmur heard. Pulmonary/Chest: Effort normal and breath sounds normal. No respiratory distress. She has no wheezes. She has no rales.  Abdominal: Soft. Bowel sounds are normal. She exhibits no distension and no mass. There is tenderness ( Mild to moderate tenderness in the epigastrium, no other abdominal tenderness, no guarding or peritoneal signs).  Musculoskeletal: Normal range of motion. She exhibits no edema or tenderness.  Lymphadenopathy:    She has no cervical adenopathy.  Neurological: She is alert. Coordination normal.  Skin: Skin is warm and dry. No rash noted. No erythema.  Psychiatric: She has a normal mood and affect. Her behavior is normal.  Nursing note and vitals reviewed.   ED Course  Procedures (including critical care time) Labs  Review Labs Reviewed - No data to display  Imaging Review No results found. I have personally reviewed and evaluated these images and lab results as part of my medical decision-making.    MDM   Final diagnoses:  Chronic abdominal pain  Non-intractable vomiting with nausea, vomiting of unspecified type    The patient's vital signs show mild tachycardia but no hypotension or fevers. Her workup has been negative multiple times including on May 3, just 6 days ago, she is agreeable to not having any other testing but only symptomatic control at this time. Medications ordered, fluids ordered  The pt has had medicine for both pain and nausea - VS remain without tachycardia - her BP is consistently chronically elevated - she has Been cautioned against using ongoing opiate medications for her pain as this is likely causing an ileus or chronic abdominal pain which is cyclical. She has been given Zofran and Reglan, she has been given prescriptions for the same, she has an MRI tomorrow, she is willing to return should her symptoms worsen. At this time I do not think she has a acute pathological surgical condition, this is likely chronic  Meds given in ED:  Medications  HYDROmorphone (DILAUDID) injection 1 mg (1 mg Intravenous Given 07/29/15 1536)  metoCLOPramide (REGLAN) injection 10 mg (10 mg Intravenous Given 07/29/15 1536)  sodium chloride 0.9 % bolus 1,000 mL (0 mLs Intravenous Stopped 07/29/15 1645)  enalaprilat (VASOTEC) injection 1.25 mg (1.25 mg Intravenous Given 07/29/15 1726)  ondansetron (ZOFRAN) injection 4 mg (4 mg Intravenous Given 07/29/15 1726)    New Prescriptions   METOCLOPRAMIDE (REGLAN) 10 MG TABLET    Take 1 tablet (10 mg total) by mouth every 6 (six) hours.   ONDANSETRON (ZOFRAN ODT) 4 MG DISINTEGRATING TABLET    Take 1 tablet (4 mg total) by mouth every 8 (eight) hours as needed for nausea.      Noemi Chapel, MD 07/29/15 [phone]

## 2015-07-30 ENCOUNTER — Inpatient Hospital Stay: Admission: RE | Admit: 2015-07-30 | Source: Ambulatory Visit

## 2015-07-31 NOTE — Telephone Encounter (Signed)
Pt is aware she needs to do the MRI and will try to do soon.

## 2015-07-31 NOTE — Telephone Encounter (Signed)
Tried to call. Mail box full and could not leave a VM.

## 2015-08-12 ENCOUNTER — Telehealth: Payer: Self-pay

## 2015-08-12 NOTE — Telephone Encounter (Signed)
Jordan Haney from Dr. Arnoldo Morale office called and states that pt was a NO call/No show to her appointment with Dr. Arnoldo Morale

## 2015-08-12 NOTE — Telephone Encounter (Signed)
REVIEWED-NO ADDITIONAL RECOMMENDATIONS. 

## 2015-08-21 ENCOUNTER — Encounter: Payer: Self-pay | Admitting: Gastroenterology

## 2015-08-21 ENCOUNTER — Telehealth: Payer: Self-pay | Admitting: Gastroenterology

## 2015-08-21 ENCOUNTER — Encounter (INDEPENDENT_AMBULATORY_CARE_PROVIDER_SITE_OTHER): Admitting: Gastroenterology

## 2015-08-21 NOTE — Telephone Encounter (Signed)
PT WAS A NO SHOW AND LETTER SENT  °

## 2015-08-21 NOTE — Telephone Encounter (Addendum)
REVIEWED-NO ADDITIONAL RECOMMENDATIONS. 

## 2015-08-21 NOTE — Progress Notes (Signed)
Subjective:    Patient ID: Jordan Haney, female    DOB: 02-22-1956, 60 y.o.   MRN: [id number]  HPI PT LAST SEEN AS INPT APR 2017. ASKED PT TO COMPLETE MRCP APR 24 AND TO SEE GENERAL SURGERY APPT.  REFERRED TO [facility] FOR 2ND OPINION.    Past Medical History  Diagnosis Date  . RA (rheumatoid arthritis) ([place])  2008  . Constipation 03/21/2011  . Hypokalemia 03/21/2011  . Fatty liver 03/21/2011  . Pancreatitis     states in remote past, very severe, ?biliary pancreatitis   . T12 compression fracture ([place]) 2011  . Duodenal ulcer     remote per patient. +BC powders, patient report negative EGD six months ago  . Dilated pancreatic duct ([place])     ?chronic pancreatitis   . Kidney stones   . Chronic abdominal pain   . Pneumonia     as child  . Anemia   . Lumbar stress fracture   . Anxiety   . Essential hypertension 06/09/2015    Past Surgical History  Procedure Laterality Date  . Cholecystectomy    . Knee surgery    . Appendectomy    . Complete hysterectomy    . Ventral wall hernia    . Esophagogastroduodenoscopy  08/2010    Dr. Earley Brooke, Versed. small hh, mild prepylori gastritis. path unavailable  . Esophagogastroduodenoscopy  04/2010    Dr. Earley Brooke, Versed. small hh, mild distal esophagitis, antral gastritis and duodenitis. Bx mild chronic gastritis and no H.pylori  . Esophagogastroduodenoscopy  08/2009    Dr. Posey Pronto, Versed. Moderate gastritis, moderate duodenitis with nodularity in proximal duodenal bulb, bx chronic gastritis, no helicobacter, mild chronic duodenitis  . Esophagogastroduodenoscopy  02/2007    Dr. Earley Brooke, Versed. antral gastritis and duodenal ulcers. Bx mild chronic gastritis. No bx from duodenal ulcer available.  [name] dilation  04/06/2011    Procedure: SAVORY DILATION;  Surgeon: Dorothyann Peng, MD;  Location: [facility];  Service: Endoscopy;  Laterality: N/A;  16 mm  . Esophagogastroduodenoscopy  04/06/11    Dr. Oneida Alar: 1-2 cm hiatal hernia, mod gastritis,  55mmX3mm linear ulcer in duodenal bulb, stricture 1st part of duodenum, dilated to 12 mm  . Esophagogastroduodenoscopy  05/12/11    partial gastric oulet obstruction secondary to stricture between bulb/2nd portion of duodenum with marked friability and inflammation but no discrete ulcer, dilated stomach. s/p dilation but high risk for restenosis  . Esophagogastroduodenoscopy  06/22/2011    [id number] ring/Moderate gastritis/PERSISTENT Ulcer in the bulb/descending duodenum Stricture in the bulb/descending duodenum  . Gastrojejunostomy  06/28/2011    Roux-en-Y with choledochoduodenostomy   . Abdominal hysterectomy    . Kidney stone surgery    . Esophagogastroduodenoscopy Left 06/25/2012    RMR: Normal esophagus. Surgically altered anastomosis to  small intestine noted. Couple of  at the anastomosis.  Patent efferent limb Patient had an oozing 1 cm anastomotic ulcer  . Back surgery  2013  . Colonoscopy N/A 08/16/2012    Dr. Gala Romney: normal but inadequate prep. Consider early interval colonoscopy.   . Cardiac catheterization      2008  . Esophagogastroduodenoscopy Left 10/23/2012    Dr. Gala Romney: mild erosive reflux esophagitis, s/p hemigastrectomy, prior site of gastric ulcer healed   . Total hip arthroplasty Left 01/2015    in [place], [place]  . Total hip arthroplasty    . Kyphoplasty  Jan 2017    [place], [place]   . Exploratory laparotomy w/ bowel resection  2014, sometime in the summer   . Esophagogastroduodenoscopy N/A 06/11/2015    Dr. Gala Romney: normal esophagus, roux-en-y anastomosis      Review of Systems     Objective:   Physical Exam        Assessment & Plan:

## 2015-08-23 ENCOUNTER — Emergency Department (HOSPITAL_COMMUNITY)
Admission: EM | Admit: 2015-08-23 | Discharge: 2015-08-23 | Disposition: A | Discharge status: Home / Self Care | Attending: Emergency Medicine | Admitting: Emergency Medicine

## 2015-08-23 ENCOUNTER — Encounter (HOSPITAL_COMMUNITY): Payer: Self-pay | Admitting: Emergency Medicine

## 2015-08-23 ENCOUNTER — Emergency Department (HOSPITAL_COMMUNITY)

## 2015-08-23 DIAGNOSIS — M545 Low back pain, unspecified: Secondary | ICD-10-CM

## 2015-08-23 DIAGNOSIS — I10 Essential (primary) hypertension: Secondary | ICD-10-CM | POA: Insufficient documentation

## 2015-08-23 DIAGNOSIS — Z79899 Other long term (current) drug therapy: Secondary | ICD-10-CM | POA: Insufficient documentation

## 2015-08-23 LAB — BASIC METABOLIC PANEL
ANION GAP: 7 (ref 5–15)
BUN: 8 mg/dL (ref 6–20)
CALCIUM: 9.6 mg/dL (ref 8.9–10.3)
CHLORIDE: 104 mmol/L (ref 101–111)
CO2: 28 mmol/L (ref 22–32)
CREATININE: 0.51 mg/dL (ref 0.44–1.00)
GFR calc non Af Amer: >60 mL/min (ref >60)
Glucose, Bld: 92 mg/dL (ref 65–99)
Potassium: 3.7 mmol/L (ref 3.5–5.1)
SODIUM: 139 mmol/L (ref 135–145)

## 2015-08-23 LAB — URINE MICROSCOPIC-ADD ON

## 2015-08-23 LAB — CBC WITH DIFFERENTIAL/PLATELET
BASOS PCT: 2 %
Basophils Absolute: 0.1 10*3/uL (ref 0.0–0.1)
EOS ABS: 0.1 10*3/uL (ref 0.0–0.7)
Eosinophils Relative: 4 %
HCT: 34 % — ABNORMAL LOW (ref 36.0–46.0)
HEMOGLOBIN: 10.6 g/dL — AB (ref 12.0–15.0)
Lymphocytes Relative: 36 %
Lymphs Abs: 1.2 10*3/uL (ref 0.7–4.0)
MCH: 24.6 pg — ABNORMAL LOW (ref 26.0–34.0)
MCHC: 31.2 g/dL (ref 30.0–36.0)
MCV: 78.9 fL (ref 78.0–100.0)
Monocytes Absolute: 0.3 10*3/uL (ref 0.1–1.0)
Monocytes Relative: 9 %
NEUTROS PCT: 49 %
Neutro Abs: 1.6 10*3/uL — ABNORMAL LOW (ref 1.7–7.7)
Platelets: 248 10*3/uL (ref 150–400)
RBC: 4.31 MIL/uL (ref 3.87–5.11)
RDW: 14.3 % (ref 11.5–15.5)
WBC: 3.3 10*3/uL — AB (ref 4.0–10.5)

## 2015-08-23 LAB — URINALYSIS, ROUTINE W REFLEX MICROSCOPIC
BILIRUBIN URINE: NEGATIVE
Glucose, UA: NEGATIVE mg/dL
Hgb urine dipstick: NEGATIVE
KETONES UR: NEGATIVE mg/dL
NITRITE: NEGATIVE
Protein, ur: NEGATIVE mg/dL
pH: 5.5 (ref 5.0–8.0)

## 2015-08-23 MED ORDER — OXYCODONE-ACETAMINOPHEN 5-325 MG PO TABS: 1.0000 | ORAL_TABLET | ORAL | Status: DC | PRN

## 2015-08-23 MED ORDER — ONDANSETRON HCL 4 MG/2ML IJ SOLN
4.0000 mg | Freq: Once | INTRAMUSCULAR | Status: AC
  Administered 2015-08-23: 4 mg via INTRAVENOUS
  Filled 2015-08-23: qty 2

## 2015-08-23 MED ORDER — SODIUM CHLORIDE 0.9 % IV BOLUS (SEPSIS)
500.0000 mL | Freq: Once | INTRAVENOUS | Status: AC
  Administered 2015-08-23: 500 mL via INTRAVENOUS

## 2015-08-23 MED ORDER — HYDROMORPHONE HCL 1 MG/ML IJ SOLN
1.0000 mg | Freq: Once | INTRAMUSCULAR | Status: AC
  Administered 2015-08-23: 1 mg via INTRAVENOUS
  Filled 2015-08-23: qty 1

## 2015-08-23 MED ORDER — SODIUM CHLORIDE 0.9 % IV SOLN
INTRAVENOUS | Status: DC
  Administered 2015-08-23: 15:00:00 via INTRAVENOUS

## 2015-08-23 NOTE — ED Notes (Addendum)
Patient c/o mid-lower back pain x1 week. Per patient progressively worse last night. Patient states she is unable to walk or turn over in bad due to pain. Patient denies any new injuries to back but reports having multiple back surgeries with hx or oosporosis. Denies taking anything for pain.

## 2015-08-23 NOTE — Discharge Instructions (Signed)
Back Pain, Adult °Back pain is very common in adults. The cause of back pain is rarely dangerous and the pain often gets better over time. The cause of your back pain may not be known. Some common causes of back pain include: °· Strain of the muscles or ligaments supporting the spine. °· Wear and tear (degeneration) of the spinal disks. °· Arthritis. °· Direct injury to the back. °For many people, back pain may return. Since back pain is rarely dangerous, most people can learn to manage this condition on their own. °HOME CARE INSTRUCTIONS °Watch your back pain for any changes. The following actions may help to lessen any discomfort you are feeling: °· Remain active. It is stressful on your back to sit or stand in one place for long periods of time. Do not sit, drive, or stand in one place for more than 30 minutes at a time. Take short walks on even surfaces as soon as you are able. Try to increase the length of time you walk each day. °· Exercise regularly as directed by your health care provider. Exercise helps your back heal faster. It also helps avoid future injury by keeping your muscles strong and flexible. °· Do not stay in bed. Resting more than 1-2 days can delay your recovery. °· Pay attention to your body when you bend and lift. The most comfortable positions are those that put less stress on your recovering back. Always use proper lifting techniques, including: °· Bending your knees. °· Keeping the load close to your body. °· Avoiding twisting. °· Find a comfortable position to sleep. Use a firm mattress and lie on your side with your knees slightly bent. If you lie on your back, put a pillow under your knees. °· Avoid feeling anxious or stressed. Stress increases muscle tension and can worsen back pain. It is important to recognize when you are anxious or stressed and learn ways to manage it, such as with exercise. °· Take medicines only as directed by your health care provider. Over-the-counter  medicines to reduce pain and inflammation are often the most helpful. Your health care provider may prescribe muscle relaxant drugs. These medicines help dull your pain so you can more quickly return to your normal activities and healthy exercise. °· Apply ice to the injured area: °· Put ice in a plastic bag. °· Place a towel between your skin and the bag. °· Leave the ice on for 20 minutes, 2-3 times a day for the first 2-3 days. After that, ice and heat may be alternated to reduce pain and spasms. °· Maintain a healthy weight. Excess weight puts extra stress on your back and makes it difficult to maintain good posture. °SEEK MEDICAL CARE IF: °· You have pain that is not relieved with rest or medicine. °· You have increasing pain going down into the legs or buttocks. °· You have pain that does not improve in one week. °· You have night pain. °· You lose weight. °· You have a fever or chills. °SEEK IMMEDIATE MEDICAL CARE IF:  °· You develop new bowel or bladder control problems. °· You have unusual weakness or numbness in your arms or legs. °· You develop nausea or vomiting. °· You develop abdominal pain. °· You feel faint. °  °This information is not intended to replace advice given to you by your health care provider. Make sure you discuss any questions you have with your health care provider. °  °Document Released: 03/08/2005 Document Revised: 03/29/2014 Document Reviewed: 07/10/2013 °Elsevier Interactive Patient Education ©2016 Else[employer]   Inc[employer]  Musculoskeletal Pain Musculoskeletal pain is muscle and boney aches and pains. These pains can occur in any part of the body. Your caregiver may treat you without knowing the cause of the pain. They may treat you if blood or urine tests, X-rays, and other tests were normal.  CAUSES There is often not a definite cause or reason for these pains. These pains may be caused by a type of germ (virus). The discomfort may also come from overuse. Overuse includes working out  too hard when your body is not fit. Boney aches also come from weather changes. Bone is sensitive to atmospheric pressure changes. HOME CARE INSTRUCTIONS   Ask when your test results will be ready. Make sure you get your test results.  Only take over-the-counter or prescription medicines for pain, discomfort, or fever as directed by your caregiver. If you were given medications for your condition, do not drive, operate machinery or power tools, or sign legal documents for 24 hours. Do not drink alcohol. Do not take sleeping pills or other medications that may interfere with treatment.  Continue all activities unless the activities cause more pain. When the pain lessens, slowly resume normal activities. Gradually increase the intensity and duration of the activities or exercise.  During periods of severe pain, bed rest may be helpful. Lay or sit in any position that is comfortable.  Putting ice on the injured area.  Put ice in a bag.  Place a towel between your skin and the bag.  Leave the ice on for 15 to 20 minutes, 3 to 4 times a day.  Follow up with your caregiver for continued problems and no reason can be found for the pain. If the pain becomes worse or does not go away, it may be necessary to repeat tests or do additional testing. Your caregiver may need to look further for a possible cause. SEEK IMMEDIATE MEDICAL CARE IF:  You have pain that is getting worse and is not relieved by medications.  You develop chest pain that is associated with shortness or breath, sweating, feeling sick to your stomach (nauseous), or throw up (vomit).  Your pain becomes localized to the abdomen.  You develop any new symptoms that seem different or that concern you. MAKE SURE YOU:   Understand these instructions.  Will watch your condition.  Will get help right away if you are not doing well or get worse.   This information is not intended to replace advice given to you by your health care  provider. Make sure you discuss any questions you have with your health care provider.   Document Released: 03/08/2005 Document Revised: 05/31/2011 Document Reviewed: 11/10/2012 Elsevier Interactive Patient Education [employer].

## 2015-08-23 NOTE — ED Notes (Signed)
MD at bedside. 

## 2015-08-23 NOTE — ED Provider Notes (Signed)
CSN: [id number]     Arrival date & time 08/23/15  1348 History   First MD Initiated Contact with Patient 08/23/15 1408     Chief Complaint  Patient presents with  . Back Pain     (Consider location/radiation/quality/duration/timing/severity/associated sxs/prior Treatment) HPI   Jordan Haney is a 60 y.o. female presents for evaluation of back pain. The pain started spontaneously, 10 days ago without trauma. Pain is similar to her prior episodes of recurrent back pain. She denies fever, chills, nausea, vomiting, abdominal pain, focal weakness or dizziness. She has had to resort to using her walker, to help her ambulate. She denies any radicular symptoms in her legs. She denies change in bowel or bladder function. She is using her usual medications, without relief. She states that she does not have a PCP, or a provider to help her with her ongoing back pain. She last had kyphoplasty, L5, 5 months ago in [place], [place]. There are no other no modifying factors  Past Medical History  Diagnosis Date  . RA (rheumatoid arthritis) ([place])  2008  . Constipation 03/21/2011  . Hypokalemia 03/21/2011  . Fatty liver 03/21/2011  . Pancreatitis     states in remote past, very severe, ?biliary pancreatitis   . T12 compression fracture ([place]) 2011  . Duodenal ulcer     remote per patient. +BC powders, patient report negative EGD six months ago  . Dilated pancreatic duct ([place])     ?chronic pancreatitis   . Kidney stones   . Chronic abdominal pain   . Pneumonia     as child  . Anemia   . Lumbar stress fracture   . Anxiety   . Essential hypertension 06/09/2015   Past Surgical History  Procedure Laterality Date  . Cholecystectomy    . Knee surgery    . Appendectomy    . Complete hysterectomy    . Ventral wall hernia    . Esophagogastroduodenoscopy  08/2010    Dr. Earley Brooke, [place]. small hh, mild prepylori gastritis. path unavailable  . Esophagogastroduodenoscopy  04/2010    Dr. Earley Brooke, [place].  small hh, mild distal esophagitis, antral gastritis and duodenitis. Bx mild chronic gastritis and no H.pylori  . Esophagogastroduodenoscopy  08/2009    Dr. Posey Pronto, [place]. Moderate gastritis, moderate duodenitis with nodularity in proximal duodenal bulb, bx chronic gastritis, no helicobacter, mild chronic duodenitis  . Esophagogastroduodenoscopy  02/2007    Dr. Earley Brooke, [place]. antral gastritis and duodenal ulcers. Bx mild chronic gastritis. No bx from duodenal ulcer available.  [name] dilation  04/06/2011    Procedure: SAVORY DILATION;  Surgeon: Dorothyann Peng, MD;  Location: [facility];  Service: Endoscopy;  Laterality: N/A;  16 mm  . Esophagogastroduodenoscopy  04/06/11    Dr. Oneida Alar: 1-2 cm hiatal hernia, mod gastritis, 87mmX3mm linear ulcer in duodenal bulb, stricture 1st part of duodenum, dilated to 12 mm  . Esophagogastroduodenoscopy  05/12/11    partial gastric oulet obstruction secondary to stricture between bulb/2nd portion of duodenum with marked friability and inflammation but no discrete ulcer, dilated stomach. s/p dilation but high risk for restenosis  . Esophagogastroduodenoscopy  06/22/2011    [id number] ring/Moderate gastritis/PERSISTENT Ulcer in the bulb/descending duodenum Stricture in the bulb/descending duodenum  . Gastrojejunostomy  06/28/2011    Roux-en-Y with choledochoduodenostomy   . Abdominal hysterectomy    . Kidney stone surgery    . Esophagogastroduodenoscopy Left 06/25/2012    [facility]: Normal esophagus. Surgically altered anastomosis to  small intestine noted. Couple of  at the anastomosis.  Patent efferent limb Patient had an oozing 1 cm anastomotic ulcer  . Back surgery  2013  . Colonoscopy N/A 08/16/2012    Dr. Gala Romney: normal but inadequate prep. Consider early interval colonoscopy.   . Cardiac catheterization      2008  . Esophagogastroduodenoscopy Left 10/23/2012    Dr. Gala Romney: mild erosive reflux esophagitis, s/p hemigastrectomy, prior site of gastric ulcer healed   . Total  hip arthroplasty Left 01/2015    in [place], [place]  . Total hip arthroplasty    . Kyphoplasty  Jan 2017    [place], [place]   . Exploratory laparotomy w/ bowel resection      2014, sometime in the summer   . Esophagogastroduodenoscopy N/A 06/11/2015    Dr. Gala Romney: normal esophagus, roux-en-y anastomosis    Family History  Problem Relation Age of Onset  . Colon cancer Neg Hx   . Liver cancer Neg Hx   . Breast cancer Neg Hx   . Inflammatory bowel disease Neg Hx   . Heart attack Mother    Social History  Substance Use Topics  . Smoking status: Never Smoker   . Smokeless tobacco: Never Used  . Alcohol Use: No   OB History    No data available     Review of Systems  All other systems reviewed and are negative.     Allergies  Compazine; Phenergan; Vistaril; Benadryl; Gabapentin; and Latex  Home Medications   Prior to Admission medications   Medication Sig Start Date End Date Taking? Authorizing Provider  lisinopril (PRINIVIL,ZESTRIL) 20 MG tablet Take 20 mg by mouth daily.   Yes Historical Provider, MD  metoCLOPramide (REGLAN) 10 MG tablet Take 1 tablet (10 mg total) by mouth every 6 (six) hours. 07/29/15  Yes Noemi Chapel, MD  ondansetron (ZOFRAN ODT) 4 MG disintegrating tablet Take 1 tablet (4 mg total) by mouth every 8 (eight) hours as needed for nausea. 07/29/15  Yes Noemi Chapel, MD  pantoprazole (PROTONIX) 40 MG tablet Take 1 tablet (40 mg total) by mouth 2 (two) times daily. 06/02/15  Yes Kathie Dike, MD  amitriptyline (ELAVIL) 10 MG tablet Take 1 tablet (10 mg total) by mouth at bedtime. Patient not taking: Reported on 08/23/2015 07/05/15   Orvan Falconer, MD  oxyCODONE-acetaminophen (PERCOCET) 5-325 MG tablet Take 1 tablet by mouth every 4 (four) hours as needed for severe pain. 08/23/15   Daleen Bo, MD  oxyCODONE-acetaminophen (PERCOCET/ROXICET) 5-325 MG tablet Take 1 tablet by mouth every 4 (four) hours as needed. 08/23/15   Daleen Bo, MD   BP 142/91 mmHg  Pulse 93   Temp(Src) 98.9 F (37.2 C) (Oral)  Resp 18  Ht 5\' 4"  (1.626 m)  Wt 105 lb (47.628 kg)  BMI 18.01 kg/m2  SpO2 97% Physical Exam  Constitutional: She is oriented to person, place, and time. She appears well-developed. She appears distressed (She is uncomfortable).  Appears older than stated age.  HENT:  Head: Normocephalic and atraumatic.  Right Ear: External ear normal.  Left Ear: External ear normal.  Eyes: Conjunctivae and EOM are normal. Pupils are equal, round, and reactive to light.  Neck: Normal range of motion and phonation normal. Neck supple.  Cardiovascular: Normal rate, regular rhythm and normal heart sounds.   Pulmonary/Chest: Effort normal and breath sounds normal. She exhibits no bony tenderness.  Abdominal: Soft. There is no tenderness.  Musculoskeletal: Normal range of motion.  Diffuse upper and lower back pain, midline and laterally, bilaterally. No  evident deformity of the spine.  Neurological: She is alert and oriented to person, place, and time. No cranial nerve deficit or sensory deficit. She exhibits normal muscle tone. Coordination normal.  Skin: Skin is warm, dry and intact.  Psychiatric: She has a normal mood and affect. Her behavior is normal.  Nursing note and vitals reviewed.   ED Course  Procedures (including critical care time)  Medications  0.9 %  sodium chloride infusion ( Intravenous Stopped 08/23/15 1741)  sodium chloride 0.9 % bolus 500 mL (0 mLs Intravenous Stopped 08/23/15 1522)  HYDROmorphone (DILAUDID) injection 1 mg (1 mg Intravenous Given 08/23/15 1442)  ondansetron (ZOFRAN) injection 4 mg (4 mg Intravenous Given 08/23/15 1442)    Patient Vitals for the past 24 hrs:  BP Temp Temp src Pulse Resp SpO2 Height Weight  08/23/15 1636 142/91 mmHg - - 93 18 97 % - -  08/23/15 1352 119/86 mmHg 98.9 F (37.2 C) Oral 105 16 98 % 5\' 4"  (1.626 m) 105 lb (47.628 kg)    During the evaluation she was able to ambulate to the bathroom, to produce a urine  sample. This distance was about 45 feet.  5:40 PM Reevaluation with update and discussion. After initial assessment and treatment, an updated evaluation reveals she is somewhat better after the pain medication. She has no additional complaints. We discussed the findings, and possible treatment plan in the future. She and her husband understand and have no further questions.Daleen Bo L    Labs Review Labs Reviewed  URINALYSIS, ROUTINE W REFLEX MICROSCOPIC (NOT AT [facility]) - Abnormal; Notable for the following:    Specific Gravity, Urine <1.005 (*)    Leukocytes, UA TRACE (*)    All other components within normal limits  CBC WITH DIFFERENTIAL/PLATELET - Abnormal; Notable for the following:    WBC 3.3 (*)    Hemoglobin 10.6 (*)    HCT 34.0 (*)    MCH 24.6 (*)    Neutro Abs 1.6 (*)    All other components within normal limits  URINE MICROSCOPIC-ADD ON - Abnormal; Notable for the following:    Squamous Epithelial / LPF 0-5 (*)    Bacteria, UA RARE (*)    All other components within normal limits  URINE CULTURE  BASIC METABOLIC PANEL    Imaging Review Dg Lumbar Spine Complete  08/23/2015  CLINICAL DATA:  One week history of progressive lumbago EXAM: LUMBAR SPINE - COMPLETE 4+ VIEW COMPARISON:  Lumbar radiographs April 04, 2015; lumbar CT April 04, 2015 FINDINGS: Frontal, lateral, spot lumbosacral lateral, and bilateral oblique views were obtained. There are 5 non-rib-bearing lumbar type vertebral bodies. The patient has undergone kyphoplasty procedures at L3 and L5. There is stable fairly marked anterior wedging of the T12 vertebral body. There is evidence of a prior fracture of the anterior superior endplate of the L4 vertebral body. No acute fracture is evident. There is no spondylolisthesis. There is moderately severe disc space narrowing at T11-12. There is no appreciable disc space narrowing in the lumbar regions. There is facet osteoarthritic change on the left at L4-5 and L5-S1.  Multiple surgical clips in the abdomen are noted. IMPRESSION: Prior fractures at several levels. Patient has had kyphoplasty procedures at L3 and L5. Note that contrast extends into the L5-S1 interspace. No new fracture. No spondylolisthesis. Areas of arthropathy as noted above. Electronically Signed   By: Lowella Grip III M.D.   On: 08/23/2015 15:25   I have personally reviewed and evaluated these images  and lab results as part of my medical decision-making.   EKG Interpretation None      MDM   Final diagnoses:  Midline low back pain without sciatica    Chronic back pain with flares. No evidence for new compression fracture. No clinical evidence for spinal myelopathy. Patient can be treated symptomatically. She will likely need follow-up with specialist. Of note, there is a potential kyphoplasty failure with some cement in the L5-S1 interspace. It is unclear to me if this is a possible cause of her ongoing back pain. It would be beneficial for her to meet with a specialist to arrange some ongoing follow-up care.  Nursing Notes Reviewed/ Care Coordinated Applicable Imaging Reviewed Interpretation of Laboratory Data incorporated into ED treatment  The patient appears reasonably screened and/or stabilized for discharge and I doubt any other medical condition or other [facility] requiring further screening, evaluation, or treatment in the ED at this time prior to discharge.  Plan: Home Medications- Percocet; Home Treatments- rest; return here if the recommended treatment, does not improve the symptoms; Recommended follow up- ortho 2-3 weeks     Daleen Bo, MD 08/23/15 1755

## 2015-08-24 ENCOUNTER — Emergency Department (HOSPITAL_COMMUNITY)

## 2015-08-24 ENCOUNTER — Encounter (HOSPITAL_COMMUNITY): Payer: Self-pay | Admitting: Nurse Practitioner

## 2015-08-24 ENCOUNTER — Emergency Department (HOSPITAL_COMMUNITY)
Admission: EM | Admit: 2015-08-24 | Discharge: 2015-08-24 | Disposition: A | Discharge status: Home / Self Care | Attending: Emergency Medicine | Admitting: Emergency Medicine

## 2015-08-24 DIAGNOSIS — G8918 Other acute postprocedural pain: Secondary | ICD-10-CM | POA: Diagnosis present

## 2015-08-24 DIAGNOSIS — R112 Nausea with vomiting, unspecified: Secondary | ICD-10-CM | POA: Diagnosis not present

## 2015-08-24 DIAGNOSIS — R32 Unspecified urinary incontinence: Secondary | ICD-10-CM

## 2015-08-24 DIAGNOSIS — N39498 Other specified urinary incontinence: Secondary | ICD-10-CM | POA: Diagnosis not present

## 2015-08-24 DIAGNOSIS — M545 Low back pain: Secondary | ICD-10-CM | POA: Insufficient documentation

## 2015-08-24 DIAGNOSIS — Z79899 Other long term (current) drug therapy: Secondary | ICD-10-CM | POA: Insufficient documentation

## 2015-08-24 DIAGNOSIS — I10 Essential (primary) hypertension: Secondary | ICD-10-CM | POA: Diagnosis not present

## 2015-08-24 DIAGNOSIS — M549 Dorsalgia, unspecified: Secondary | ICD-10-CM

## 2015-08-24 LAB — URINALYSIS, ROUTINE W REFLEX MICROSCOPIC
Bilirubin Urine: NEGATIVE
GLUCOSE, UA: NEGATIVE mg/dL
Hgb urine dipstick: NEGATIVE
Ketones, ur: NEGATIVE mg/dL
LEUKOCYTES UA: NEGATIVE
NITRITE: NEGATIVE
PH: 6.5 (ref 5.0–8.0)
Protein, ur: NEGATIVE mg/dL
SPECIFIC GRAVITY, URINE: 1.007 (ref 1.005–1.030)

## 2015-08-24 MED ORDER — HYDROCODONE-ACETAMINOPHEN 5-325 MG PO TABS: 1.0000 | ORAL_TABLET | ORAL | Status: DC | PRN

## 2015-08-24 MED ORDER — DIAZEPAM 5 MG/ML IJ SOLN
5.0000 mg | Freq: Once | INTRAMUSCULAR | Status: AC
  Administered 2015-08-24: 5 mg via INTRAVENOUS
  Filled 2015-08-24: qty 2

## 2015-08-24 MED ORDER — HYDROCODONE-ACETAMINOPHEN 5-325 MG PO TABS: 2.0000 | ORAL_TABLET | ORAL | Status: DC | PRN

## 2015-08-24 MED ORDER — METHYLPREDNISOLONE 4 MG PO TBPK: ORAL_TABLET | ORAL | Status: DC

## 2015-08-24 MED ORDER — HYDROCODONE-ACETAMINOPHEN 5-325 MG PO TABS
2.0000 | ORAL_TABLET | Freq: Once | ORAL | Status: AC
  Administered 2015-08-24: 2 via ORAL
  Filled 2015-08-24: qty 2

## 2015-08-24 MED ORDER — HYDROMORPHONE HCL 1 MG/ML IJ SOLN
0.5000 mg | INTRAMUSCULAR | Status: DC | PRN
  Administered 2015-08-24: 0.5 mg via INTRAVENOUS
  Filled 2015-08-24: qty 1

## 2015-08-24 MED ORDER — DEXAMETHASONE SODIUM PHOSPHATE 10 MG/ML IJ SOLN
10.0000 mg | Freq: Once | INTRAMUSCULAR | Status: AC
  Administered 2015-08-24: 10 mg via INTRAVENOUS
  Filled 2015-08-24: qty 1

## 2015-08-24 MED ORDER — LORAZEPAM 2 MG/ML IJ SOLN
1.0000 mg | INTRAMUSCULAR | Status: DC | PRN
  Filled 2015-08-24: qty 1

## 2015-08-24 MED ORDER — ONDANSETRON HCL 4 MG/2ML IJ SOLN
4.0000 mg | Freq: Once | INTRAMUSCULAR | Status: AC
  Administered 2015-08-24: 4 mg via INTRAVENOUS
  Filled 2015-08-24: qty 2

## 2015-08-24 MED ORDER — METHOCARBAMOL 500 MG PO TABS: 500.0000 mg | ORAL_TABLET | Freq: Two times a day (BID) | ORAL | Status: DC

## 2015-08-24 NOTE — Discharge Instructions (Signed)
Bed rest for the next 2-3 days. Slowly increase her activity. Empty your bladder at least every 2 hours to avoid retention.  Call neurosurgeon at the number above for outpatient appointment to discuss the possibility of surgical intervention for your chronic compression fractures.   Back Pain, Adult Back pain is very common in adults.The cause of back pain is rarely dangerous and the pain often gets better over time.The cause of your back pain may not be known. Some common causes of back pain include:  Strain of the muscles or ligaments supporting the spine.  Wear and tear (degeneration) of the spinal disks.  Arthritis.  Direct injury to the back. For many people, back pain may return. Since back pain is rarely dangerous, most people can learn to manage this condition on their own. HOME CARE INSTRUCTIONS Watch your back pain for any changes. The following actions may help to lessen any discomfort you are feeling:  Remain active. It is stressful on your back to sit or stand in one place for long periods of time. Do not sit, drive, or stand in one place for more than 30 minutes at a time. Take short walks on even surfaces as soon as you are able.Try to increase the length of time you walk each day.  Exercise regularly as directed by your health care provider. Exercise helps your back heal faster. It also helps avoid future injury by keeping your muscles strong and flexible.  Do not stay in bed.Resting more than 1-2 days can delay your recovery.  Pay attention to your body when you bend and lift. The most comfortable positions are those that put less stress on your recovering back. Always use proper lifting techniques, including:  Bending your knees.  Keeping the load close to your body.  Avoiding twisting.  Find a comfortable position to sleep. Use a firm mattress and lie on your side with your knees slightly bent. If you lie on your back, put a pillow under your knees.  Avoid  feeling anxious or stressed.Stress increases muscle tension and can worsen back pain.It is important to recognize when you are anxious or stressed and learn ways to manage it, such as with exercise.  Take medicines only as directed by your health care provider. Over-the-counter medicines to reduce pain and inflammation are often the most helpful.Your health care provider may prescribe muscle relaxant drugs.These medicines help dull your pain so you can more quickly return to your normal activities and healthy exercise.  Apply ice to the injured area:  Put ice in a plastic bag.  Place a towel between your skin and the bag.  Leave the ice on for 20 minutes, 2-3 times a day for the first 2-3 days. After that, ice and heat may be alternated to reduce pain and spasms.  Maintain a healthy weight. Excess weight puts extra stress on your back and makes it difficult to maintain good posture. SEEK MEDICAL CARE IF:  You have pain that is not relieved with rest or medicine.  You have increasing pain going down into the legs or buttocks.  You have pain that does not improve in one week.  You have night pain.  You lose weight.  You have a fever or chills. SEEK IMMEDIATE MEDICAL CARE IF:   You develop new bowel or bladder control problems.  You have unusual weakness or numbness in your arms or legs.  You develop nausea or vomiting.  You develop abdominal pain.  You feel faint.  This information is not intended to replace advice given to you by your health care provider. Make sure you discuss any questions you have with your health care provider.   Document Released: 03/08/2005 Document Revised: 03/29/2014 Document Reviewed: 07/10/2013 Elsevier Interactive Patient Education [employer].

## 2015-08-24 NOTE — ED Provider Notes (Signed)
CSN: [id number]     Arrival date & time 08/24/15  1335 History   First MD Initiated Contact with Patient 08/24/15 1458     Chief Complaint  Patient presents with  . Back Pain      HPI  Patient presented for evaluation of abdominal pain and urinary incontinence.  Patient fractured her hip in August of last year. Had a lumbar compression fracture that underwent kyphoplasty in December. These were performed in [place].  She states that 3 or 4 days ago she waking 1 morning very suddenly and especially had severe pain in her back. Does radiate to her buttocks. She feels as though it is perhaps more on the right side.  She was seen and evaluated last night it [facility] and had plain films of her back performed. Apparently had extravasation of her arthroplasty cemented into the disc space. Was told to follow-up with neurosurgery for this. She presents today feeling that she is worse. She states that last night while laying in bed she wet herself and was not aware that her ladder was full. This happening and this morning was simply standing from her couch. She does feel slightly weak in her legs.  Last bowel movement was yesterday. Has not been incontinent. Does not feel rectal pressure. She is ambulatory with assist and "great pain".  Triage note states patient reports nausea vomiting diarrhea incontinence and fevers. She had a loose bowel movement yesterday which was her last. None since. She states she had nausea and vomiting before her visit anything yesterday. Has not vomited since. She states she felt warm and had her fianc touch her face and she "felt warm and "she does not have a thermometer to home. Retention was normal at [facility] last night, and remained so today  Past Medical History  Diagnosis Date  . RA (rheumatoid arthritis) ([place])  2008  . Constipation 03/21/2011  . Hypokalemia 03/21/2011  . Fatty liver 03/21/2011  . Pancreatitis     states in remote past, very severe,  ?biliary pancreatitis   . T12 compression fracture ([place]) 2011  . Duodenal ulcer     remote per patient. +BC powders, patient report negative EGD six months ago  . Dilated pancreatic duct ([place])     ?chronic pancreatitis   . Kidney stones   . Chronic abdominal pain   . Pneumonia     as child  . Anemia   . Lumbar stress fracture   . Anxiety   . Essential hypertension 06/09/2015   Past Surgical History  Procedure Laterality Date  . Cholecystectomy    . Knee surgery    . Appendectomy    . Complete hysterectomy    . Ventral wall hernia    . Esophagogastroduodenoscopy  08/2010    Dr. Earley Brooke, Versed. small hh, mild prepylori gastritis. path unavailable  . Esophagogastroduodenoscopy  04/2010    Dr. Earley Brooke, Versed. small hh, mild distal esophagitis, antral gastritis and duodenitis. Bx mild chronic gastritis and no H.pylori  . Esophagogastroduodenoscopy  08/2009    Dr. Posey Pronto, Versed. Moderate gastritis, moderate duodenitis with nodularity in proximal duodenal bulb, bx chronic gastritis, no helicobacter, mild chronic duodenitis  . Esophagogastroduodenoscopy  02/2007    Dr. Earley Brooke, Versed. antral gastritis and duodenal ulcers. Bx mild chronic gastritis. No bx from duodenal ulcer available.  [name] dilation  04/06/2011    Procedure: SAVORY DILATION;  Surgeon: Dorothyann Peng, MD;  Location: [facility];  Service: Endoscopy;  Laterality: N/A;  16  mm  . Esophagogastroduodenoscopy  04/06/11    Dr. Oneida Alar: 1-2 cm hiatal hernia, mod gastritis, 83mmX3mm linear ulcer in duodenal bulb, stricture 1st part of duodenum, dilated to 12 mm  . Esophagogastroduodenoscopy  05/12/11    partial gastric oulet obstruction secondary to stricture between bulb/2nd portion of duodenum with marked friability and inflammation but no discrete ulcer, dilated stomach. s/p dilation but high risk for restenosis  . Esophagogastroduodenoscopy  06/22/2011    [id number] ring/Moderate gastritis/PERSISTENT Ulcer in the bulb/descending  duodenum Stricture in the bulb/descending duodenum  . Gastrojejunostomy  06/28/2011    Roux-en-Y with choledochoduodenostomy   . Abdominal hysterectomy    . Kidney stone surgery    . Esophagogastroduodenoscopy Left 06/25/2012    RMR: Normal esophagus. Surgically altered anastomosis to  small intestine noted. Couple of  at the anastomosis.  Patent efferent limb Patient had an oozing 1 cm anastomotic ulcer  . Back surgery  2013  . Colonoscopy N/A 08/16/2012    Dr. Gala Romney: normal but inadequate prep. Consider early interval colonoscopy.   . Cardiac catheterization      2008  . Esophagogastroduodenoscopy Left 10/23/2012    Dr. Gala Romney: mild erosive reflux esophagitis, s/p hemigastrectomy, prior site of gastric ulcer healed   . Total hip arthroplasty Left 01/2015    in [place], [place]  . Total hip arthroplasty    . Kyphoplasty  Jan 2017    [place], [place]   . Exploratory laparotomy w/ bowel resection      2014, sometime in the summer   . Esophagogastroduodenoscopy N/A 06/11/2015    Dr. Gala Romney: normal esophagus, roux-en-y anastomosis    Family History  Problem Relation Age of Onset  . Colon cancer Neg Hx   . Liver cancer Neg Hx   . Breast cancer Neg Hx   . Inflammatory bowel disease Neg Hx   . Heart attack Mother    Social History  Substance Use Topics  . Smoking status: Never Smoker   . Smokeless tobacco: Never Used  . Alcohol Use: No   OB History    No data available     Review of Systems  Constitutional: Negative for fever, chills, diaphoresis, appetite change and fatigue.  HENT: Negative for mouth sores, sore throat and trouble swallowing.   Eyes: Negative for visual disturbance.  Respiratory: Negative for cough, chest tightness, shortness of breath and wheezing.   Cardiovascular: Negative for chest pain.  Gastrointestinal: Positive for nausea, vomiting and diarrhea. Negative for abdominal pain and abdominal distention.  Endocrine: Negative for polydipsia, polyphagia and polyuria.    Genitourinary: Negative for dysuria, frequency and hematuria.  Musculoskeletal: Positive for back pain. Negative for gait problem.  Skin: Negative for color change, pallor and rash.  Neurological: Positive for weakness. Negative for dizziness, syncope, light-headedness and headaches.  Hematological: Does not bruise/bleed easily.  Psychiatric/Behavioral: Negative for behavioral problems and confusion.      Allergies  Compazine; Phenergan; Vistaril; Benadryl; Gabapentin; and Latex  Home Medications   Prior to Admission medications   Medication Sig Start Date End Date Taking? Authorizing Provider  ondansetron (ZOFRAN ODT) 4 MG disintegrating tablet Take 1 tablet (4 mg total) by mouth every 8 (eight) hours as needed for nausea. 07/29/15  Yes Noemi Chapel, MD  amitriptyline (ELAVIL) 10 MG tablet Take 1 tablet (10 mg total) by mouth at bedtime. Patient not taking: Reported on 08/23/2015 07/05/15   Orvan Falconer, MD  lisinopril (PRINIVIL,ZESTRIL) 20 MG tablet Take 20 mg by mouth daily.    Historical Provider,  MD  metoCLOPramide (REGLAN) 10 MG tablet Take 1 tablet (10 mg total) by mouth every 6 (six) hours. 07/29/15   Noemi Chapel, MD  oxyCODONE-acetaminophen (PERCOCET) 5-325 MG tablet Take 1 tablet by mouth every 4 (four) hours as needed for severe pain. 08/23/15   Daleen Bo, MD  oxyCODONE-acetaminophen (PERCOCET/ROXICET) 5-325 MG tablet Take 1 tablet by mouth every 4 (four) hours as needed. 08/23/15   Daleen Bo, MD  pantoprazole (PROTONIX) 40 MG tablet Take 1 tablet (40 mg total) by mouth 2 (two) times daily. 06/02/15   Kathie Dike, MD   BP 169/114 mmHg  Pulse 94  Temp(Src) 98.4 F (36.9 C) (Oral)  Resp 17  SpO2 100% Physical Exam  Constitutional: She is oriented to person, place, and time. She appears well-developed and well-nourished. No distress.  HENT:  Head: Normocephalic.  Eyes: Conjunctivae are normal. Pupils are equal, round, and reactive to light. No scleral icterus.  Neck: Normal  range of motion. Neck supple. No thyromegaly present.  Cardiovascular: Normal rate and regular rhythm.  Exam reveals no gallop and no friction rub.   No murmur heard. Pulmonary/Chest: Effort normal and breath sounds normal. No respiratory distress. She has no wheezes. She has no rales.  Abdominal: Soft. Bowel sounds are normal. She exhibits no distension. There is no tenderness. There is no rebound.  Musculoskeletal: Normal range of motion.       Back:  Tenderness and low midline lumbar spine.  Neurological: She is alert and oriented to person, place, and time.  Has 4/5 strength of quadriceps, plantar flexion dorsiflexion.  Hyperreflexic symmetric knee jerk. 1+ hypo-reflexive Achilles. No response to the Babinski. Reports normal sensation bilateral lower Action release. Does not have Decreased rectal tone. 500 mL on bladder scan.  Skin: Skin is warm and dry. No rash noted.  Psychiatric: She has a normal mood and affect. Her behavior is normal.    ED Course  Procedures (including critical care time) Labs Review Labs Reviewed  URINALYSIS, [place] (NOT AT [facility])  CBC WITH DIFFERENTIAL/PLATELET  COMPREHENSIVE METABOLIC PANEL    Imaging Review Dg Lumbar Spine Complete  08/23/2015  CLINICAL DATA:  One week history of progressive lumbago EXAM: LUMBAR SPINE - COMPLETE 4+ VIEW COMPARISON:  Lumbar radiographs April 04, 2015; lumbar CT April 04, 2015 FINDINGS: Frontal, lateral, spot lumbosacral lateral, and bilateral oblique views were obtained. There are 5 non-rib-bearing lumbar type vertebral bodies. The patient has undergone kyphoplasty procedures at L3 and L5. There is stable fairly marked anterior wedging of the T12 vertebral body. There is evidence of a prior fracture of the anterior superior endplate of the L4 vertebral body. No acute fracture is evident. There is no spondylolisthesis. There is moderately severe disc space narrowing at T11-12. There is no appreciable disc  space narrowing in the lumbar regions. There is facet osteoarthritic change on the left at L4-5 and L5-S1. Multiple surgical clips in the abdomen are noted. IMPRESSION: Prior fractures at several levels. Patient has had kyphoplasty procedures at L3 and L5. Note that contrast extends into the L5-S1 interspace. No new fracture. No spondylolisthesis. Areas of arthropathy as noted above. Electronically Signed   By: Lowella Grip III M.D.   On: 08/23/2015 15:25   I have personally reviewed and evaluated these images and lab results as part of my medical decision-making.   EKG Interpretation None      MDM   Final diagnoses:  Back pain  Incontinence    Plan MRI to evaluate  her cord space. I can theorize that migration of her kyphoplasty cement could potentially cause some pain. However, likely not a culprit for an acute neurological process. Her findings, collectively, are not all consistent with myelopathy. However her bladder symptoms and new worsening pain are concerning.  However, she has been taking 2 Percocet at a time for the last 2 days. In her episodes do sound like overflow incontinence.. States she has to go be "put out for an MRI". Encouraged her to try to undergo this with Ativan and pain control.  MRI shows chronic compression at L5 with kyphoplasty cement present in the disc space. Unchanged in its appearance and morphology from January of this year. Previous other kyphoplasty morphologies are unchanged. She has no impingement on her cord space.  Discussion:  Her symptoms have improved. I discussed her findings with her. Catheters removed. I think her clinical incontinence and "may have been related to her her pain medications and simply not to getting up to go into the bladder. Advised to do so on a regular basis. Will treat for acute exacerbation of chronic pain with pain medication, Neurontin, muscle relaxant, steroid. Neurosurgical referral if her symptoms continue. She lives in  [place] with her daughter who will be with her. Advised that she be at bedrest and off for feet for 23/24 hours for the next few days until her symptoms improve.    Tanna Furry, MD 08/24/15 Pauline Aus

## 2015-08-24 NOTE — ED Notes (Addendum)
She c/o 1 day history of back pain. She was seen at [facility] and told that the cement from her kyphoplasty was leaking and she should come to [facility] for care. She waited through last night hoping symptoms would get better but she did not impove.. She reports n/v, diarrhea, urinary incontinence and fevers since yesterday as well. Labs and xrays were done at [facility] yesterday

## 2015-08-24 NOTE — ED Notes (Signed)
1mg  Ativan given to pt in MRI due to inability to stay still during scan.

## 2015-08-24 NOTE — ED Notes (Signed)
Pt departed in NAD.  

## 2015-08-25 LAB — URINE CULTURE: Special Requests: NORMAL

## 2015-08-27 MED FILL — Oxycodone w/ Acetaminophen Tab 5-325 MG: ORAL | Qty: 6 | Status: AC

## 2015-09-08 ENCOUNTER — Encounter (HOSPITAL_COMMUNITY): Payer: Self-pay | Admitting: Emergency Medicine

## 2015-09-08 ENCOUNTER — Emergency Department (HOSPITAL_COMMUNITY)
Admission: EM | Admit: 2015-09-08 | Discharge: 2015-09-09 | Disposition: A | Discharge status: Home / Self Care | Attending: Emergency Medicine | Admitting: Emergency Medicine

## 2015-09-08 DIAGNOSIS — M069 Rheumatoid arthritis, unspecified: Secondary | ICD-10-CM | POA: Insufficient documentation

## 2015-09-08 DIAGNOSIS — R339 Retention of urine, unspecified: Secondary | ICD-10-CM | POA: Diagnosis not present

## 2015-09-08 DIAGNOSIS — I10 Essential (primary) hypertension: Secondary | ICD-10-CM | POA: Insufficient documentation

## 2015-09-08 DIAGNOSIS — R109 Unspecified abdominal pain: Secondary | ICD-10-CM | POA: Diagnosis present

## 2015-09-08 LAB — CBC WITH DIFFERENTIAL/PLATELET
BASOS ABS: 0.1 10*3/uL (ref 0.0–0.1)
BASOS PCT: 1 %
EOS ABS: 0.2 10*3/uL (ref 0.0–0.7)
EOS PCT: 3 %
HCT: 32.5 % — ABNORMAL LOW (ref 36.0–46.0)
HEMOGLOBIN: 10.4 g/dL — AB (ref 12.0–15.0)
LYMPHS ABS: 2.1 10*3/uL (ref 0.7–4.0)
Lymphocytes Relative: 33 %
MCH: 25.1 pg — AB (ref 26.0–34.0)
MCHC: 32 g/dL (ref 30.0–36.0)
MCV: 78.3 fL (ref 78.0–100.0)
Monocytes Absolute: 0.4 10*3/uL (ref 0.1–1.0)
Monocytes Relative: 7 %
NEUTROS PCT: 56 %
Neutro Abs: 3.6 10*3/uL (ref 1.7–7.7)
PLATELETS: 301 10*3/uL (ref 150–400)
RBC: 4.15 MIL/uL (ref 3.87–5.11)
RDW: 14.9 % (ref 11.5–15.5)
WBC: 6.4 10*3/uL (ref 4.0–10.5)

## 2015-09-08 LAB — URINALYSIS, ROUTINE W REFLEX MICROSCOPIC
BILIRUBIN URINE: NEGATIVE
Glucose, UA: NEGATIVE mg/dL
Hgb urine dipstick: NEGATIVE
Ketones, ur: NEGATIVE mg/dL
Leukocytes, UA: NEGATIVE
NITRITE: NEGATIVE
PH: 5.5 (ref 5.0–8.0)
Protein, ur: NEGATIVE mg/dL

## 2015-09-08 LAB — BASIC METABOLIC PANEL
Anion gap: 7 (ref 5–15)
BUN: 6 mg/dL (ref 6–20)
CHLORIDE: 106 mmol/L (ref 101–111)
CO2: 26 mmol/L (ref 22–32)
Calcium: 9.4 mg/dL (ref 8.9–10.3)
Creatinine, Ser: 0.45 mg/dL (ref 0.44–1.00)
Glucose, Bld: 98 mg/dL (ref 65–99)
POTASSIUM: 3.5 mmol/L (ref 3.5–5.1)
SODIUM: 139 mmol/L (ref 135–145)

## 2015-09-08 MED ORDER — HYDROCODONE-ACETAMINOPHEN 5-325 MG PO TABS: 1.0000 | ORAL_TABLET | ORAL | Status: DC | PRN

## 2015-09-08 MED ORDER — HYDROCODONE-ACETAMINOPHEN 5-325 MG PO TABS
2.0000 | ORAL_TABLET | Freq: Once | ORAL | Status: AC
  Administered 2015-09-09: 2 via ORAL
  Filled 2015-09-08: qty 2

## 2015-09-08 NOTE — Discharge Instructions (Signed)
Acute Urinary Retention, Female °Acute urinary retention is the temporary inability to urinate. This is an uncommon problem in women. It can be caused by: °· Infection. °· A side effect of a medicine. °· A problem in a nearby organ that presses or squeezes on the bladder or the urethra (the tube that drains the bladder). °· Psychological problems. °·  Surgery on your bladder, urethra, or pelvic organs that causes obstruction to the outflow of urine from your bladder. °HOME CARE INSTRUCTIONS  °If you are sent home with a Foley catheter and a drainage system, you will need to discuss the best course of action with your health care provider. While the catheter is in, maintain a good intake of fluids. Keep the drainage bag emptied and lower than your catheter. This is so that contaminated urine will not flow back into your bladder, which could lead to a urinary tract infection. °There are two main types of drainage bags. One is a large bag that usually is used at night. It has a good capacity that will allow you to sleep through the night without having to empty it. The second type is called a leg bag. It has a smaller capacity so it needs to be emptied more frequently. However, the main advantage is that it can be attached by a leg strap and goes underneath your clothing, allowing you the freedom to move about or leave your home. °Only take over-the-counter or prescription medicines for pain, discomfort, or fever as directed by your health care provider.  °SEEK MEDICAL CARE IF: °· You develop a low-grade fever. °· You experience spasms or leakage of urine with the spasms. °SEEK IMMEDIATE MEDICAL CARE IF:  °· You develop chills or fever. °· Your catheter stops draining urine. °· Your catheter falls out. °· You start to develop increased bleeding that does not respond to rest and increased fluid intake. °MAKE SURE YOU: °· Understand these instructions. °· Will watch your condition. °· Will get help right away if you are  not doing well or get worse. °  °This information is not intended to replace advice given to you by your health care provider. Make sure you discuss any questions you have with your health care provider. °  °Document Released: 03/07/2006 Document Revised: 07/23/2014 Document Reviewed: 08/17/2012 °Elsevier Interactive Patient Education ©2016 [employer] Inc[employer] ° °

## 2015-09-08 NOTE — ED Provider Notes (Signed)
CSN: [id number]     Arrival date & time 09/08/15  2116 History   First MD Initiated Contact with Patient 09/08/15 2203     Chief Complaint  Patient presents with  . Flank Pain  . Dysuria     (Consider location/radiation/quality/duration/timing/severity/associated sxs/prior Treatment) Patient is a 60 y.o. female presenting with flank pain and dysuria. The history is provided by the patient. No language interpreter was used.  Flank Pain This is a new problem. The current episode started today. The problem occurs constantly. The problem has been unchanged. Associated symptoms include urinary symptoms. Pertinent negatives include no abdominal pain. Nothing aggravates the symptoms. She has tried nothing for the symptoms. The treatment provided moderate relief.  Dysuria Associated symptoms: flank pain   Associated symptoms: no abdominal pain   Pt complains of not being able to urinate.    Past Medical History  Diagnosis Date  . RA (rheumatoid arthritis) ([place])  2008  . Constipation 03/21/2011  . Hypokalemia 03/21/2011  . Fatty liver 03/21/2011  . Pancreatitis     states in remote past, very severe, ?biliary pancreatitis   . T12 compression fracture ([place]) 2011  . Duodenal ulcer     remote per patient. +BC powders, patient report negative EGD six months ago  . 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 Kidney stones   . Chronic abdominal pain   . Pneumonia     as child  . Anemia   . Lumbar stress fracture   . Anxiety   . Essential hypertension 06/09/2015   Past Surgical History  Procedure Laterality Date  . Cholecystectomy    . Knee surgery    . Appendectomy    . Complete hysterectomy    . Ventral wall hernia    . Esophagogastroduodenoscopy  08/2010    Dr. Earley Brooke, Versed. small hh, mild prepylori gastritis. path unavailable  . Esophagogastroduodenoscopy  04/2010    Dr. Earley Brooke, Versed. small hh, mild distal esophagitis, antral gastritis and duodenitis. Bx mild  chronic gastritis and no H.pylori  . Esophagogastroduodenoscopy  08/2009    Dr. Posey Pronto, Versed. Moderate gastritis, moderate duodenitis with nodularity in proximal duodenal bulb, bx chronic gastritis, no helicobacter, mild chronic duodenitis  . Esophagogastroduodenoscopy  02/2007    Dr. Earley Brooke, Versed. antral gastritis and duodenal ulcers. Bx mild chronic gastritis. No bx from duodenal ulcer available.  [name] dilation  04/06/2011    Procedure: SAVORY DILATION;  Surgeon: Dorothyann Peng, MD;  Location: [facility];  Service: Endoscopy;  Laterality: N/A;  16 mm  . Esophagogastroduodenoscopy  04/06/11    Dr. Oneida Alar: 1-2 cm hiatal hernia, mod gastritis, 34mmX3mm linear ulcer in duodenal bulb, stricture 1st part of duodenum, dilated to 12 mm  . Esophagogastroduodenoscopy  05/12/11    partial gastric oulet obstruction secondary to stricture between bulb/2nd portion of duodenum with marked friability and inflammation but no discrete ulcer, dilated stomach. s/p dilation but high risk for restenosis  . Esophagogastroduodenoscopy  06/22/2011    [id number] ring/Moderate gastritis/PERSISTENT Ulcer in the bulb/descending duodenum Stricture in the bulb/descending duodenum  . Gastrojejunostomy  06/28/2011    Roux-en-Y with choledochoduodenostomy   . Abdominal hysterectomy    . Kidney stone surgery    . Esophagogastroduodenoscopy Left 06/25/2012    RMR: Normal esophagus. Surgically altered anastomosis to  small intestine noted. Couple of  at the anastomosis.  Patent efferent limb Patient had an oozing 1 cm anastomotic ulcer  . Back surgery  2013  .  Colonoscopy N/A 08/16/2012    Dr. Gala Romney: normal but inadequate prep. Consider early interval colonoscopy.   . Cardiac catheterization      2008  . Esophagogastroduodenoscopy Left 10/23/2012    Dr. Gala Romney: mild erosive reflux esophagitis, s/p hemigastrectomy, prior site of gastric ulcer healed   . Total hip arthroplasty Left 01/2015    in [place], [place]  . Total hip  arthroplasty    . Kyphoplasty  Jan 2017    [place], [place]   . Exploratory laparotomy w/ bowel resection      2014, sometime in the summer   . Esophagogastroduodenoscopy N/A 06/11/2015    Dr. Gala Romney: normal esophagus, roux-en-y anastomosis    Family History  Problem Relation Age of Onset  . Colon cancer Neg Hx   . Liver cancer Neg Hx   . Breast cancer Neg Hx   . Inflammatory bowel disease Neg Hx   . Heart attack Mother    Social History  Substance Use Topics  . Smoking status: Never Smoker   . Smokeless tobacco: Never Used  . Alcohol Use: No   OB History    No data available     Review of Systems  Gastrointestinal: Negative for abdominal pain.  Genitourinary: Positive for dysuria and flank pain.  All other systems reviewed and are negative.     Allergies  Compazine; Phenergan; Vistaril; Benadryl; Gabapentin; and Latex  Home Medications   Prior to Admission medications   Medication Sig Start Date End Date Taking? Authorizing Provider  amitriptyline (ELAVIL) 10 MG tablet Take 1 tablet (10 mg total) by mouth at bedtime. Patient not taking: Reported on 08/23/2015 07/05/15   Orvan Falconer, MD  HYDROcodone-acetaminophen (NORCO/VICODIN) 5-325 MG tablet Take 2 tablets by mouth every 4 (four) hours as needed. 08/24/15   Tanna Furry, MD  HYDROcodone-acetaminophen (NORCO/VICODIN) 5-325 MG tablet Take 1 tablet by mouth every 4 (four) hours as needed. 08/24/15   Tanna Furry, MD  lisinopril (PRINIVIL,ZESTRIL) 20 MG tablet Take 20 mg by mouth daily.    Historical Provider, MD  methocarbamol (ROBAXIN) 500 MG tablet Take 1 tablet (500 mg total) by mouth 2 (two) times daily. 08/24/15   Tanna Furry, MD  methylPREDNISolone (MEDROL DOSEPAK) 4 MG TBPK tablet 6 po on day 1, decrease by 1 tab per day 08/24/15   Tanna Furry, MD  metoCLOPramide (REGLAN) 10 MG tablet Take 1 tablet (10 mg total) by mouth every 6 (six) hours. 07/29/15   Noemi Chapel, MD  ondansetron (ZOFRAN ODT) 4 MG disintegrating tablet Take 1 tablet (4  mg total) by mouth every 8 (eight) hours as needed for nausea. 07/29/15   Noemi Chapel, MD  oxyCODONE-acetaminophen (PERCOCET) 5-325 MG tablet Take 1 tablet by mouth every 4 (four) hours as needed for severe pain. 08/23/15   Daleen Bo, MD  oxyCODONE-acetaminophen (PERCOCET/ROXICET) 5-325 MG tablet Take 1 tablet by mouth every 4 (four) hours as needed. 08/23/15   Daleen Bo, MD  pantoprazole (PROTONIX) 40 MG tablet Take 1 tablet (40 mg total) by mouth 2 (two) times daily. 06/02/15   Kathie Dike, MD   BP 153/78 mmHg  Pulse 86  Temp(Src) 98.9 F (37.2 C) (Oral)  Resp 16  Ht 5\' 6"  (1.676 m)  Wt 47.628 kg  BMI 16.96 kg/m2  SpO2 97% Physical Exam  Constitutional: She is oriented to person, place, and time. She appears well-developed and well-nourished.  HENT:  Head: Normocephalic and atraumatic.  Eyes: EOM are normal.  Neck: Normal range of motion.  Cardiovascular:  Normal rate.   Pulmonary/Chest: Effort normal.  Abdominal: Soft. She exhibits no distension.  Musculoskeletal: Normal range of motion.  Neurological: She is alert and oriented to person, place, and time.  Skin: Skin is warm.  Psychiatric: She has a normal mood and affect.  Nursing note and vitals reviewed.   ED Course  Procedures (including critical care time) Labs Review Labs Reviewed  CBC WITH DIFFERENTIAL/PLATELET - Abnormal; Notable for the following:    Hemoglobin 10.4 (*)    HCT 32.5 (*)    MCH 25.1 (*)    All other components within normal limits  URINALYSIS, ROUTINE W REFLEX MICROSCOPIC (NOT AT [facility])  BASIC METABOLIC PANEL    Imaging Review No results found. I have personally reviewed and evaluated these images and lab results as part of my medical decision-making.   EKG Interpretation None      MDM Pt had 741 ml of urine on bladder scan.  Foley placed.   Urine shows no infection   Final diagnoses:  Urinary retention    Pt advised to follow up with [facility] [place] office.  Foley  with leg bag to drain.   Hydrocodone Rx An After Visit Summary was printed and given to the patient.   Hollace Kinnier [place], PA-C 09/09/15 0030  Ezequiel Essex, MD 09/09/15 [phone]

## 2015-09-08 NOTE — ED Notes (Signed)
Patient complaining or right flank pain and "I haven't peed since about 2:00 today." States she has history of kidney stones.

## 2015-09-09 NOTE — ED Notes (Signed)
Foley bag left in place per pt request, instead of changing to the leg bag. Instructed and demonstrated how to change from foley bag to leg bag. Pt verbalized understanding.

## 2016-11-04 ENCOUNTER — Ambulatory Visit: Admitting: Gastroenterology

## 2016-11-04 ENCOUNTER — Encounter: Payer: Self-pay | Admitting: Gastroenterology

## 2016-11-04 ENCOUNTER — Telehealth: Payer: Self-pay | Admitting: Gastroenterology

## 2016-11-04 NOTE — Telephone Encounter (Signed)
PT WAS A NO SHOW AND LETTER SENT  °

## 2016-12-01 ENCOUNTER — Telehealth: Payer: Self-pay

## 2016-12-01 NOTE — Telephone Encounter (Signed)
Pt called and was very upset that she has just been told that she has a bowel obstruction from CT results. She has appt here on Monday 12/06/2016 with Roseanne Kaufman, NP , but was not sure what to do. Said she is in severe abdominal pain now. She said she has had problems before and Dr. Oneida Alar helped her get straightened out.  I told her with the severe pain and being told she had obstruction, she needs to go to ED and I recommended [facility], and that way her reports would be here. Her call back number is [phone].  I told her I would inform Dr. Oneida Alar and she said she would go to the ED.

## 2016-12-02 NOTE — Telephone Encounter (Addendum)
PLEASE CALL PT. SHE HAS NOT BEEN SEEN IN THE OFFICE SINCE 2014. I CANNOT COMMENT ON OUTSIDE FILMS AND OUR RADIOLOGIST CANNOT READ FILMS FROM [place]. IN ADDITION SHE WAS SUPPOSE TO COMPLETE AND MRCP IN 2017 BUT DID NOT. SHE WAS REFERRED TO DR. Arnoldo Morale AND SHE DID NOT KEEP HER APPT WITH HIM. AS WELL SHE DID NOT SHOW UP FOR HER MOST RECENT APPT WITH [facility] IN AUG 2018.   DUE TO HER INABILITY TO ADHERE TO OUR RECOMMENDATIONS WE WILL NEED TO DISCHARGE HER FROM THE PRACTICE. IF SHE IS HAVING SEVERE ABDOMINAL PAIN SHE CAN STILL GO TO THE NEAREST ED FOR THE ED AT [facility] OR AN EVALUATION.

## 2016-12-02 NOTE — Telephone Encounter (Signed)
Tried to call pt and mail box is full and cannot accept messages.

## 2016-12-06 ENCOUNTER — Ambulatory Visit (INDEPENDENT_AMBULATORY_CARE_PROVIDER_SITE_OTHER): Admitting: Gastroenterology

## 2016-12-06 ENCOUNTER — Emergency Department (HOSPITAL_COMMUNITY)

## 2016-12-06 ENCOUNTER — Encounter (HOSPITAL_COMMUNITY): Payer: Self-pay | Admitting: Emergency Medicine

## 2016-12-06 ENCOUNTER — Encounter: Payer: Self-pay | Admitting: General Practice

## 2016-12-06 ENCOUNTER — Emergency Department (HOSPITAL_COMMUNITY)
Admission: EM | Admit: 2016-12-06 | Discharge: 2016-12-06 | Disposition: A | Discharge status: Home / Self Care | Attending: Emergency Medicine | Admitting: Emergency Medicine

## 2016-12-06 ENCOUNTER — Encounter: Payer: Self-pay | Admitting: Gastroenterology

## 2016-12-06 VITALS — BP 188/120 | HR 82 | Temp 97.6°F | Ht 62.0 in | Wt 113.6 lb

## 2016-12-06 DIAGNOSIS — I10 Essential (primary) hypertension: Secondary | ICD-10-CM | POA: Insufficient documentation

## 2016-12-06 DIAGNOSIS — Z79899 Other long term (current) drug therapy: Secondary | ICD-10-CM | POA: Diagnosis not present

## 2016-12-06 DIAGNOSIS — G8929 Other chronic pain: Secondary | ICD-10-CM | POA: Insufficient documentation

## 2016-12-06 DIAGNOSIS — R109 Unspecified abdominal pain: Secondary | ICD-10-CM | POA: Diagnosis not present

## 2016-12-06 DIAGNOSIS — I161 Hypertensive emergency: Secondary | ICD-10-CM

## 2016-12-06 DIAGNOSIS — R7989 Other specified abnormal findings of blood chemistry: Secondary | ICD-10-CM

## 2016-12-06 DIAGNOSIS — R079 Chest pain, unspecified: Secondary | ICD-10-CM | POA: Diagnosis present

## 2016-12-06 DIAGNOSIS — Z7982 Long term (current) use of aspirin: Secondary | ICD-10-CM | POA: Insufficient documentation

## 2016-12-06 DIAGNOSIS — R778 Other specified abnormalities of plasma proteins: Secondary | ICD-10-CM

## 2016-12-06 LAB — CBC
HEMATOCRIT: 36.6 % (ref 36.0–46.0)
Hemoglobin: 11.4 g/dL — ABNORMAL LOW (ref 12.0–15.0)
MCH: 27.1 pg (ref 26.0–34.0)
MCHC: 31.1 g/dL (ref 30.0–36.0)
MCV: 87.1 fL (ref 78.0–100.0)
PLATELETS: 436 10*3/uL — AB (ref 150–400)
RBC: 4.2 MIL/uL (ref 3.87–5.11)
RDW: 16.2 % — ABNORMAL HIGH (ref 11.5–15.5)
WBC: 7.4 10*3/uL (ref 4.0–10.5)

## 2016-12-06 LAB — BASIC METABOLIC PANEL
Anion gap: 12 (ref 5–15)
BUN: 5 mg/dL — ABNORMAL LOW (ref 6–20)
CHLORIDE: 102 mmol/L (ref 101–111)
CO2: 24 mmol/L (ref 22–32)
CREATININE: 0.75 mg/dL (ref 0.44–1.00)
Calcium: 8.9 mg/dL (ref 8.9–10.3)
Glucose, Bld: 85 mg/dL (ref 65–99)
Potassium: 3 mmol/L — ABNORMAL LOW (ref 3.5–5.1)
SODIUM: 138 mmol/L (ref 135–145)

## 2016-12-06 LAB — TROPONIN I
Troponin I: 0.06 ng/mL (ref <0.03)
Troponin I: 0.06 ng/mL (ref <0.03)

## 2016-12-06 MED ORDER — CLONIDINE HCL 0.2 MG PO TABS
0.2000 mg | ORAL_TABLET | Freq: Once | ORAL | Status: AC
  Administered 2016-12-06: 0.2 mg via ORAL
  Filled 2016-12-06: qty 1

## 2016-12-06 MED ORDER — POTASSIUM CHLORIDE CRYS ER 20 MEQ PO TBCR
40.0000 meq | EXTENDED_RELEASE_TABLET | Freq: Once | ORAL | Status: AC
  Administered 2016-12-06: 40 meq via ORAL
  Filled 2016-12-06: qty 2

## 2016-12-06 NOTE — Assessment & Plan Note (Signed)
61 year old female

## 2016-12-06 NOTE — Progress Notes (Signed)
PCP NOT IN SYSTEM

## 2016-12-06 NOTE — Telephone Encounter (Signed)
DR. Oneida Alar WILL BE RESPONSIBLE FOR PT FOR NEXT 30 DAYS BUT AFTER THAT SHE WILL NEED TO FIND ANOTHER GI DOCTOR. GIVE HER A LETTER TO DISCHARGE HER FROM THE PRACTICE TODAY.

## 2016-12-06 NOTE — ED Provider Notes (Addendum)
[facility] DEPT Provider Note   CSN: [id number] Arrival date & time: 12/06/16  1229     History   Chief Complaint Chief Complaint  Patient presents with  . Chest Pain  . Blurred Vision  . Dizziness    HPI Jordan Haney is a 61 y.o. female.  Patient was referred from the gastroenterologist office of Dr. Oneida Alar today for elevated blood pressure. Her blood pressure is typically normal. Patient does complain of dizziness and blurred vision. She has allegedly had "a third of her stomach removed", chronic abdominal pain, duodenal ulcer, hypertension, pancreatitis, pneumonia, rheumatoid arthritis. No substernal chest pain, diaphoresis, nausea, arm or leg weakness.      Past Medical History:  Diagnosis Date  . Anemia   . Anxiety   . Chronic abdominal pain   . Constipation 03/21/2011  . Dilated pancreatic duct    ?chronic pancreatitis   . Duodenal ulcer    remote per patient. +BC powders, patient report negative EGD six months ago  . Essential hypertension 06/09/2015  . Fatty liver 03/21/2011  . Hypokalemia 03/21/2011  . Kidney stones   . Lumbar stress fracture   . Pancreatitis    states in remote past, very severe, ?biliary pancreatitis   . Pneumonia    as child  . RA (rheumatoid arthritis) ([place])  2008  . T12 compression fracture [facility]) 2011    Patient Active Problem List   Diagnosis Date Noted  . Malnutrition of moderate degree 07/03/2015  . Depression with anxiety   . Dilated bile duct   . Epigastric abdominal pain 06/09/2015  . Essential hypertension 06/09/2015  . Emesis   . Constipation 06/02/2015  . Urinary retention 06/02/2015  . Osteoporosis, unspecified 06/25/2012  . Thoracic compression fracture ([place]) 06/25/2012  . Dehydration 06/24/2012  . Odynophagia 06/24/2012  . UTI (lower urinary tract infection) 06/24/2012  . Colitis 06/24/2012  . Abnormal stool color 06/24/2012  . Anemia 06/21/2011  . 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 Dilation of  pancreatic duct ([place]) 06/02/2011  . Abnormal LFTs 06/02/2011  . PUD (peptic ulcer disease) 04/05/2011  . Weight loss, abnormal 04/05/2011  . Epigastric pain 04/05/2011  . Hematemesis 04/05/2011  . Abdominal pain-non-specific 03/21/2011  . Nausea and vomiting 03/21/2011  . Rheumatoid arthritis ([place]) 03/21/2011  . Constipation 03/21/2011  . Hypokalemia 03/21/2011  . Fatty liver 03/21/2011    Past Surgical History:  Procedure Laterality Date  . ABDOMINAL HYSTERECTOMY    . APPENDECTOMY    . BACK SURGERY  2013  . CARDIAC CATHETERIZATION     2008  . CHOLECYSTECTOMY    . COLONOSCOPY N/A 08/16/2012   Dr. Gala Romney: normal but inadequate prep. Consider early interval colonoscopy.   . complete hysterectomy    . ESOPHAGOGASTRODUODENOSCOPY  08/2010   Dr. Earley Brooke, Versed. small hh, mild prepylori gastritis. path unavailable  . ESOPHAGOGASTRODUODENOSCOPY  04/2010   Dr. Earley Brooke, Versed. small hh, mild distal esophagitis, antral gastritis and duodenitis. Bx mild chronic gastritis and no H.pylori  . ESOPHAGOGASTRODUODENOSCOPY  08/2009   Dr. Posey Pronto, Versed. Moderate gastritis, moderate duodenitis with nodularity in proximal duodenal bulb, bx chronic gastritis, no helicobacter, mild chronic duodenitis  . ESOPHAGOGASTRODUODENOSCOPY  02/2007   Dr. Earley Brooke, Versed. antral gastritis and duodenal ulcers. Bx mild chronic gastritis. No bx from duodenal ulcer available.  . ESOPHAGOGASTRODUODENOSCOPY  04/06/11   Dr. Oneida Alar: 1-2 cm hiatal hernia, mod gastritis, 88mmX3mm linear ulcer in duodenal bulb, stricture 1st part of duodenum, dilated to 12 mm  . ESOPHAGOGASTRODUODENOSCOPY  05/12/11  partial gastric oulet obstruction secondary to stricture between bulb/2nd portion of duodenum with marked friability and inflammation but no discrete ulcer, dilated stomach. s/p dilation but high risk for restenosis  . ESOPHAGOGASTRODUODENOSCOPY  06/22/2011   GYJ:EHUDJSHF'W ring/Moderate gastritis/PERSISTENT Ulcer in the bulb/descending  duodenum Stricture in the bulb/descending duodenum  . ESOPHAGOGASTRODUODENOSCOPY Left 06/25/2012   RMR: Normal esophagus. Surgically altered anastomosis to  small intestine noted. Couple of  at the anastomosis.  Patent efferent limb Patient had an oozing 1 cm anastomotic ulcer  . ESOPHAGOGASTRODUODENOSCOPY Left 10/23/2012   Dr. Gala Romney: mild erosive reflux esophagitis, s/p hemigastrectomy, prior site of gastric ulcer healed   . ESOPHAGOGASTRODUODENOSCOPY N/A 06/11/2015   Dr. Gala Romney: normal esophagus, roux-en-y anastomosis   . EXPLORATORY LAPAROTOMY W/ BOWEL RESECTION     2014, sometime in the summer   . GASTROJEJUNOSTOMY  06/28/2011   Roux-en-Y with choledochoduodenostomy   . KIDNEY STONE SURGERY    . KNEE SURGERY    . KYPHOPLASTY  Jan 2017   [place], Juliann Pulse   . SAVORY DILATION  04/06/2011   Procedure: SAVORY DILATION;  Surgeon: Dorothyann Peng, MD;  Location: [facility];  Service: Endoscopy;  Laterality: N/A;  16 mm  . TOTAL HIP ARTHROPLASTY Left 01/2015   in [place], [place]  . TOTAL HIP ARTHROPLASTY    . ventral wall hernia      OB History    No data available       Home Medications    Prior to Admission medications   Medication Sig Start Date End Date Taking? Authorizing Provider  aspirin EC 81 MG tablet Take 81 mg by mouth daily.   Yes [provider]  lisinopril (PRINIVIL,ZESTRIL) 20 MG tablet Take 20 mg by mouth daily.   Yes [provider]  megestrol (MEGACE) 20 MG tablet Take 20 mg by mouth daily.   Yes [provider]  oxyCODONE-acetaminophen (PERCOCET/ROXICET) 5-325 MG tablet Take 1 tablet by mouth every 4 (four) hours as needed for severe pain.   Yes [provider]  pantoprazole (PROTONIX) 40 MG tablet Take 1 tablet (40 mg total) by mouth 2 (two) times daily. 06/02/15  Yes Kathie Dike, MD  ondansetron (ZOFRAN ODT) 4 MG disintegrating tablet Take 1 tablet (4 mg total) by mouth every 8 (eight) hours as needed for nausea. Patient not taking: Reported  on 12/06/2016 07/29/15   Noemi Chapel, MD    Family History Family History  Problem Relation Age of Onset  . Heart attack Mother   . Colon cancer Neg Hx   . Liver cancer Neg Hx   . Breast cancer Neg Hx   . Inflammatory bowel disease Neg Hx     Social History Social History  Substance Use Topics  . Smoking status: Never Smoker  . Smokeless tobacco: Never Used  . Alcohol use No     Allergies   Compazine; Phenergan [promethazine]; Vistaril [hydroxyzine hcl]; Benadryl [diphenhydramine hcl]; Gabapentin; Latex; and Morphine and related   Review of Systems Review of Systems  All other systems reviewed and are negative.    Physical Exam Updated Vital Signs BP (!) 147/106   Pulse 65   Temp 98 F (36.7 C) (Oral)   Resp 12   Ht 5\' 2"  (1.575 m)   Wt 51.3 kg (113 lb)   SpO2 97%   BMI 20.67 kg/m   Physical Exam  Constitutional: She is oriented to person, place, and time. She appears well-developed and well-nourished.  Blood pressure elevated; no acute distress  HENT:  Head: Normocephalic and atraumatic.  Eyes: Conjunctivae are normal.  Neck: Neck supple.  Cardiovascular: Normal rate and regular rhythm.   Pulmonary/Chest: Effort normal and breath sounds normal.  Abdominal: Soft. Bowel sounds are normal.  Musculoskeletal: Normal range of motion.  Neurological: She is alert and oriented to person, place, and time.  Skin: Skin is warm and dry.  Psychiatric: She has a normal mood and affect. Her behavior is normal.  Nursing note and vitals reviewed.    ED Treatments / Results  Labs (all labs ordered are listed, but only abnormal results are displayed) Labs Reviewed  BASIC METABOLIC PANEL - Abnormal; Notable for the following:       Result Value   Potassium 3.0 (*)    BUN 5 (*)    All other components within normal limits  CBC - Abnormal; Notable for the following:    Hemoglobin 11.4 (*)    RDW 16.2 (*)    Platelets 436 (*)    All other components within normal  limits  TROPONIN I - Abnormal; Notable for the following:    Troponin I 0.06 (*)    All other components within normal limits  TROPONIN I - Abnormal; Notable for the following:    Troponin I 0.06 (*)    All other components within normal limits    EKG  EKG Interpretation  Date/Time:  Monday December 06 2016 12:42:46 EDT Ventricular Rate:  77 PR Interval:  140 QRS Duration: 76 QT Interval:  396 QTC Calculation: 448 R Axis:   -21 Text Interpretation:  Normal sinus rhythm Septal infarct , age undetermined Abnormal ECG Confirmed by Nat Christen [id number]) on 12/06/2016 1:49:52 PM       Radiology Dg Chest 2 View  Result Date: 12/06/2016 CLINICAL DATA:  Chest pain on and off x 2 weeks. Pain has been constant and much worse x 2 days. Fatigue. EXAM: CHEST  2 VIEW COMPARISON:  07/07/2015 FINDINGS: Cardiac silhouette is normal in size. The aorta is tortuous. No mediastinal or hilar masses. No evidence of adenopathy. Lungs are mildly hyperexpanded, but clear. No pleural effusion or pneumothorax. Skeletal structures are diffusely demineralized. There are multiple compression fractures, many of which have been treated with previous vertebroplasty. This is stable. No convincing acute fracture. IMPRESSION: No acute cardiopulmonary disease. Electronically Signed   By: Lajean Manes M.D.   On: 12/06/2016 13:03    Procedures Procedures (including critical care time)  Medications Ordered in ED Medications  cloNIDine (CATAPRES) tablet 0.2 mg (0.2 mg Oral Given 12/06/16 1424)  potassium chloride SA (K-DUR,KLOR-CON) CR tablet 40 mEq (40 mEq Oral Given 12/06/16 1507)     Initial Impression / Assessment and Plan / ED Course  I have reviewed the triage vital signs and the nursing notes.  Pertinent labs & imaging results that were available during my care of the patient were reviewed by me and considered in my medical decision making (see chart for details).     Patient presents with hypertension. Her  blood pressure reduced with clonidine 0.2 mg by mouth.No neurological deficits were noted. Her troponin was measured twice and noted to be 0.06 each time. Will discuss with cardiology.   1910:   Discussed with cardiology. I recommended that patient be admitted for observation. Both she and her husband declined. She will follow-up with her primary care doctor. Final Clinical Impressions(s) / ED Diagnoses   Final diagnoses:  Hypertension, unspecified type  Elevated troponin    New Prescriptions New Prescriptions  No medications on file     Nat Christen, MD 12/06/16 Sherryl Manges, MD 12/07/16 [phone]

## 2016-12-06 NOTE — Telephone Encounter (Signed)
Patient has an urgent ov with Vicente Males today, if she "no shows" for that appt we can discharge from practice

## 2016-12-06 NOTE — ED Triage Notes (Signed)
Patient sent over from Dr. Oneida Alar office due to hypertension, dizziness, blurred vision x 2 days. States dizziness started 3 weeks ago.

## 2016-12-06 NOTE — Patient Instructions (Addendum)
Your first blood pressure was 188/120, and with recheck it was 160/110. Since you are having vision changes and persistent chest pain, you need to go straight to the ED.   I am calling the emergency room to let them know about you coming.   We will make sure you are good from a cardiac standpoint, and decide about further evaluation. Priority right now is your heart.

## 2016-12-06 NOTE — Discharge Instructions (Signed)
The blood tests associated with your heart was slightly elevated.  This test is called troponin and it was 0.06. Recommend following up with your primary care doctor or cardiologist. Phone number given.

## 2016-12-06 NOTE — Telephone Encounter (Signed)
I spoke with Dr. Oneida Alar we will only treat the patient for the next 30 days, for urgent issues.  I will give the patient a discharge letter.

## 2016-12-06 NOTE — Progress Notes (Addendum)
REVIEWED-NO ADDITIONAL RECOMMENDATIONS.  Referring Provider: Self-referred Primary Care Physician:  Dr. Darel Hong Primary GI: Dr. Oneida Alar   Chief Complaint  Patient presents with  . Abdominal Pain  . Emesis    HPI:   Jordan Haney is a 61 y.o. female presenting today with a complicated past medical history to include PUD with multiple EGDs historically, presenting with UGI bleed in Feb 2013 and found to have large duodenal ulcer with stricture. It was felt she had NSAID-induced PUD. Follow-up EGD in April 2013 with non-healing ulcer; therefore, she underwent Roux-en-Y gastrojejunostomy by Dr. Geroge Baseman and had to have a choledochoduodnestomy as well due to bile duct injury. Most recent EGD in March 2017 during last admission completely normal. She has a history of pancreatitis in remote past, felt to be biliary in origin at that time. There is some question of chronic pancreatitis, although this has not been extensively evaluated.  Gallbladder absent. Chronically elevated alk phos level with normal GGT and AMA. She was discharged 06/17/15 and went to [facility] 3/31, where she was hospitalized until April 7th. At that time, Pain Consult completed, recommending Topamax, Cymbalta instead of Paxil, and consider celiac plexus block. Open MRCP recommended for further evaluation of dilated bile duct and pancreas duct. She has declined ANY MRCP due to claustrophobia. She was last seen by our practice in April 2017 during inpatient admission. History of multiple inpatient admissions. During last admission April 2017, Dr. Arnoldo Morale evaluated her and felt that exploratory laparotomy was not appropriate as likely low yield.   With her today are labs from an outside facility in August with ferritin 10.2, iron 41, sats 11%, Hgb 11.2, heme positive. She had a CT chest without contrast and CT abd/pelvis without contrast at [facility] on 11/19/16, with reports of multiple mild to moderately fluid filled and scattered  areas of air within mild to moderately dilated loops of bowel, fluid also in distnded loops of proximal colon. Differentials of ileus vs obstruction. Interstitial changes in the lung bases with differential considerations of interstitial fibrotic change, possible pulmonary vascular congestion. She is significantly hypertensive today with BP 188/120. Recheck 160/110.   States she was in rehab for sacral fractures. CT was done while she was in Rehab. [facility]. Was in rehab for 4 weeks and then several months ago was in rehab for 4 weeks.   States she is trying to hydrate herself. She is wishy-washy about food. Will eat and then swell up like she is 9 months pregnant. Knows "there is something wrong with my stomach". Has two distinct places in RUQ and LLQ. There all the time. Feels so sick that she feels she could fall in the floor. Missed her appt 2 weeks ago because she got it mixed up. Has severe nausea, intermittent vomiting. Sometimes can't make it out of the bed quick enough because of her joint pain. Trying to drink ensure. This morning ate an egg biscuit and coffee, drank some ensure. Trying to eat "anything" but it's a struggle to eat. No melena or hematochezia. Feels like a colon prep may kill her. For 2 weeks has had chest pain and has to sit up in the bed just to breathe. Feels so crappy. Notes blurred vision.   Willing to do the open MRI but wants something to sedate her.   Past Medical History:  Diagnosis Date  . Anemia   . Anxiety   . Chronic abdominal pain   . Constipation 03/21/2011  . Dilated pancreatic duct    ?  chronic pancreatitis   . Duodenal ulcer    remote per patient. +BC powders, patient report negative EGD six months ago  . Essential hypertension 06/09/2015  . Fatty liver 03/21/2011  . Hypokalemia 03/21/2011  . Kidney stones   . Lumbar stress fracture   . Pancreatitis    states in remote past, very severe, ?biliary pancreatitis   . Pneumonia    as child   . RA (rheumatoid arthritis) ([place])  2008  . T12 compression fracture ([place]) 2011    Past Surgical History:  Procedure Laterality Date  . ABDOMINAL HYSTERECTOMY    . APPENDECTOMY    . BACK SURGERY  2013  . CARDIAC CATHETERIZATION     2008  . CHOLECYSTECTOMY    . COLONOSCOPY N/A 08/16/2012   Dr. Gala Romney: normal but inadequate prep. Consider early interval colonoscopy.   . complete hysterectomy    . ESOPHAGOGASTRODUODENOSCOPY  08/2010   Dr. Earley Brooke, Versed. small hh, mild prepylori gastritis. path unavailable  . ESOPHAGOGASTRODUODENOSCOPY  04/2010   Dr. Earley Brooke, Versed. small hh, mild distal esophagitis, antral gastritis and duodenitis. Bx mild chronic gastritis and no H.pylori  . ESOPHAGOGASTRODUODENOSCOPY  08/2009   Dr. Posey Pronto, Versed. Moderate gastritis, moderate duodenitis with nodularity in proximal duodenal bulb, bx chronic gastritis, no helicobacter, mild chronic duodenitis  . ESOPHAGOGASTRODUODENOSCOPY  02/2007   Dr. Earley Brooke, Versed. antral gastritis and duodenal ulcers. Bx mild chronic gastritis. No bx from duodenal ulcer available.  . ESOPHAGOGASTRODUODENOSCOPY  04/06/11   Dr. Oneida Alar: 1-2 cm hiatal hernia, mod gastritis, 57mX3mm linear ulcer in duodenal bulb, stricture 1st part of duodenum, dilated to 12 mm  . ESOPHAGOGASTRODUODENOSCOPY  05/12/11   partial gastric oulet obstruction secondary to stricture between bulb/2nd portion of duodenum with marked friability and inflammation but no discrete ulcer, dilated stomach. s/p dilation but high risk for restenosis  . ESOPHAGOGASTRODUODENOSCOPY  06/22/2011   SWJX:BJYNWGNF'Aring/Moderate gastritis/PERSISTENT Ulcer in the bulb/descending duodenum Stricture in the bulb/descending duodenum  . ESOPHAGOGASTRODUODENOSCOPY Left 06/25/2012   RMR: Normal esophagus. Surgically altered anastomosis to  small intestine noted. Couple of  at the anastomosis.  Patent efferent limb Patient had an oozing 1 cm anastomotic ulcer  . ESOPHAGOGASTRODUODENOSCOPY Left  10/23/2012   Dr. RGala Romney mild erosive reflux esophagitis, s/p hemigastrectomy, prior site of gastric ulcer healed   . ESOPHAGOGASTRODUODENOSCOPY N/A 06/11/2015   Dr. RGala Romney normal esophagus, roux-en-y anastomosis   . EXPLORATORY LAPAROTOMY W/ BOWEL RESECTION     2014, sometime in the summer   . GASTROJEJUNOSTOMY  06/28/2011   Roux-en-Y with choledochoduodenostomy   . KIDNEY STONE SURGERY    . KNEE SURGERY    . [place]  [place]  . SAVORY DILATION  04/06/2011   Procedure: SAVORY DILATION;  Surgeon: SDorothyann Peng MD;  Location: [facility];  Service: Endoscopy;  Laterality: N/A;  16 mm  . [place]    . ventral wall hernia      Current Outpatient Prescriptions  Medication Sig Dispense Refill  . aspirin EC 81 MG tablet Take 81 mg by mouth daily.    .Marland Kitchenlisinopril (PRINIVIL,ZESTRIL) 20 MG tablet Take 20 mg by mouth daily.    . megestrol (MEGACE) 20 MG tablet Take 20 mg by mouth daily.    . ondansetron (ZOFRAN ODT) 4 MG disintegrating tablet Take 1 tablet (4 mg total) by mouth every 8 (eight) hours as needed for nausea. 10 tablet 0  .  pantoprazole (PROTONIX) 40 MG tablet Take 1 tablet (40 mg total) by mouth 2 (two) times daily. 60 tablet 1   No current facility-administered medications for this visit.     Allergies as of 12/06/2016 - Review Complete 12/06/2016  Allergen Reaction Noted  . Compazine Anaphylaxis 03/20/2011  . Phenergan [promethazine] Anaphylaxis 10/21/2012  . Vistaril [hydroxyzine hcl] Other (See Comments) 03/20/2011  . Benadryl [diphenhydramine hcl] Other (See Comments) 06/25/2012  . Gabapentin Other (See Comments) 07/21/2011  . Latex Swelling 06/28/2011    Family History  Problem Relation Age of Onset  . Colon cancer Neg Hx   . Liver cancer Neg Hx   . Breast cancer Neg Hx   . Inflammatory bowel disease Neg Hx   . Heart attack Mother     Social History   Social History  .  Marital status: Divorced    Spouse name: N/A  . Number of children: 1  . Years of education: N/A   Occupational History  . unemployed    Social History Main Topics  . Smoking status: Never Smoker  . Smokeless tobacco: Never Used  . Alcohol use No  . Drug use: No  . Sexual activity: Yes    Birth control/ protection: Surgical   Other Topics Concern  . None   Social History Narrative  . None    Review of Systems: Gen: see HPI  CV: see HPI  Resp: Denies SOB GI: see HPI  Derm: Denies rash, itching, dry skin Psych: Denies depression, anxiety, memory loss, confusion. No homicidal or suicidal ideation.  Heme: see HPI   Physical Exam: BP (!) 188/120   Pulse 82   Temp 97.6 F (36.4 C) (Oral)   Ht 5' 2"  (1.575 m)   Wt 113 lb 9.6 oz (51.5 kg)   BMI 20.78 kg/m  General:   Alert and oriented. Appears anxious, at baseline for patient.  Head:  Normocephalic and atraumatic. Eyes:  Conjuctiva clear without scleral icterus. Mouth:  Oral mucosa pink and moist.  Lungs: CTAB Cardiac: S1, S2 present Abdomen:  +BS, soft, point tenderness RUQ and LLQ with palpation. No rebound or guarding. No HSM.  Msk:  Symmetrical without gross deformities.  Extremities:  Without edema. Neurologic:  Alert and  oriented x4 Psych:  Alert and cooperative. Normal mood and affect.

## 2016-12-06 NOTE — Assessment & Plan Note (Signed)
61 year old female with complicated past medical history as noted in HPI, presenting with chronic abdominal pain, intermittent N/V, and thorough evaluation thus far from our office. Noted to have IDA and heme positive on outside labs. We have recommended an MRCP due to dilated pancreas and bile duct in past, and there had also been some question of chronic pancreatitis historically. She was seen at [facility] last year with recommendations to consider a celiac plexus block. Most recent imaging from [place] with concern for ileus vs early partial obstruction. Today, she seems to be at baseline when I examined her abdomen, as she is well known to me and this practice. Without signs of obstruction or acute abdomen today.   Last EGD in March 2017 was normal with Roux-en-Y anastomosis. Last colonoscopy in 2014 normal but inadequate prep. Doubt she would tolerate a prep, and she is declining any colonoscopy (had heme positive stool recently but no overt GI bleeding).   As we have continued to recommend, she will need MRI/MRCP as outpatient. She has no-showed several times to our office; however, she tells me that she realizes she missed the last appt, as she was in Rehab with sacral fractures. I will relay this to Dr. Oneida Alar and the office manager.   Most urgently today: she is significantly hypertensive with DBP in the 120s. She notes blurred vision today, and she tells me she has had intermittent chest pain for several weeks. Due to these concerning signs/symptoms, she was sent to the ED. ED Charge nurse was also notified.  We will regroup with plan of care after acute, non-GI issues are addressed today in ED.

## 2016-12-06 NOTE — ED Notes (Signed)
CRITICAL VALUE ALERT  Critical Value:  Troponin 0.06  Date & Time Notied:  12/06/2016 1434  Provider Notified: Lacinda Axon  Orders Received/Actions taken: No orders received at this time

## 2016-12-06 NOTE — Telephone Encounter (Signed)
Routing to [place] as a [employer]

## 2016-12-06 NOTE — Progress Notes (Signed)
D/w Dr. Lacinda Axon (ER physician) over the phone - patient noted to have chest pain with troponin elevation of 0.06 x 2 -stable, in the setting of hypertensive urgency/emergency - BP 150-160/110-120. Improved briefly with clonidine and then elevated again. Agree with plan for admission to gain better BP control. No urgent indication for cath. Consider outpatient stress testing once BP has improved.  Pixie Casino, MD, [place]  Attending Cardiologist  Direct Dial: [phone]  Fax: [phone]  Website:  [email or web address]

## 2016-12-07 ENCOUNTER — Telehealth: Payer: Self-pay | Admitting: Gastroenterology

## 2016-12-07 NOTE — Telephone Encounter (Signed)
Patient was seen yesterday by AB and was sent to the ER for elevated blood pressure. Pt called today to let us know that she was at the ER until 730pm last night and was so upset she just left. She said that they did labs, CXR, EKG and her BP was higher there than it was here and she was upset because of the long wait of not knowing what was going on. She said yesterday "was pure hell".  I told her that I was sorry that she went through all that yesterday and I would let AB beware. She also said that AB mentioned doing a MRI of abdomen and wanted to know what AB wanted her to do. I told her that AB could review the ER records from yesterday and someone would be back in touch with her. [phone] Patient thanked me and said she appreciated it.

## 2016-12-08 NOTE — Telephone Encounter (Signed)
Pt needs MRCP with and without contrast as previously recommended. I discussed with Dr. Oneida Alar and we will still pursue MRI; however, she IS STILL DISCHARGED FROM THE PRACTICE.  Due to her having to be sent to the ED at time of visit, I did not have an opportunity to discuss this with her.  Camille: how should we handle this? Has the letter been sent? We will still do the MRI, but it HAS to be done within this 30 day timeframe. I will address results, and she will need to seek care at [facility] for further care.

## 2016-12-09 ENCOUNTER — Other Ambulatory Visit: Payer: Self-pay

## 2016-12-09 DIAGNOSIS — K859 Acute pancreatitis without necrosis or infection, unspecified: Secondary | ICD-10-CM

## 2016-12-09 DIAGNOSIS — R109 Unspecified abdominal pain: Secondary | ICD-10-CM

## 2016-12-09 MED ORDER — ALPRAZOLAM 0.5 MG PO TABS: 0.5000 mg | ORAL_TABLET | Freq: Once | ORAL | 0 refills | Status: AC

## 2016-12-09 NOTE — Addendum Note (Signed)
Addended by: Annitta Needs on: 12/09/2016 12:54 PM   Modules accepted: Orders

## 2016-12-09 NOTE — Telephone Encounter (Signed)
Pt is aware of appointment for MRI. She said that we will need to call her in something because of her anxiety before the MRI

## 2016-12-09 NOTE — Telephone Encounter (Signed)
MRI/MRCP set up for 12/16/16 @ 9:00 am. She needs to be there at 8:45 am nothing to eat or drink after midnight. Letter in the mail

## 2016-12-09 NOTE — Telephone Encounter (Signed)
REVIEWED. PT WILL BE GIVEN ONE LAST OPPORTUNITY TO ADHERE TO OUR RECOMMENDATIONS.

## 2016-12-09 NOTE — Telephone Encounter (Signed)
I have sent in Xanax 0.5 mg to take prior to procedure.

## 2016-12-09 NOTE — Telephone Encounter (Addendum)
Routing to Dr. Oneida Alar for input

## 2016-12-09 NOTE — Telephone Encounter (Signed)
Tried to call with no answer  

## 2016-12-16 ENCOUNTER — Ambulatory Visit (HOSPITAL_COMMUNITY)

## 2017-01-19 ENCOUNTER — Telehealth: Payer: Self-pay | Admitting: Gastroenterology

## 2017-01-19 ENCOUNTER — Other Ambulatory Visit: Payer: Self-pay

## 2017-01-19 ENCOUNTER — Encounter: Payer: Self-pay | Admitting: Gastroenterology

## 2017-01-19 ENCOUNTER — Ambulatory Visit (INDEPENDENT_AMBULATORY_CARE_PROVIDER_SITE_OTHER): Admitting: Gastroenterology

## 2017-01-19 DIAGNOSIS — K838 Other specified diseases of biliary tract: Secondary | ICD-10-CM

## 2017-01-19 DIAGNOSIS — D538 Other specified nutritional anemias: Secondary | ICD-10-CM | POA: Diagnosis not present

## 2017-01-19 DIAGNOSIS — R109 Unspecified abdominal pain: Secondary | ICD-10-CM

## 2017-01-19 DIAGNOSIS — R1012 Left upper quadrant pain: Secondary | ICD-10-CM

## 2017-01-19 DIAGNOSIS — R1011 Right upper quadrant pain: Secondary | ICD-10-CM | POA: Diagnosis not present

## 2017-01-19 DIAGNOSIS — R112 Nausea with vomiting, unspecified: Secondary | ICD-10-CM

## 2017-01-19 DIAGNOSIS — K56609 Unspecified intestinal obstruction, unspecified as to partial versus complete obstruction: Secondary | ICD-10-CM

## 2017-01-19 MED ORDER — LIDOCAINE VISCOUS 2 % MT SOLN: OROMUCOSAL | 5 refills | Status: AC

## 2017-01-19 NOTE — Assessment & Plan Note (Addendum)
MOST LIKELY DUE TO INTERMITTENT SMALL BOWEL OBSTRUCTION. WEIGHT UP 8 LBS SINCE 2014. DIFFERENTIAL DIAGNOSIS INCLUDES: LOW LIKELIHOOD OF BILIARY ETIOLOGY OR OCCULT MALIGNANCY.   PT AWARE IF SHE DOES NOT ADHERE TO MY RECOMMENDATIONS I WILL NO LONGER BE HER GI DOCTOR.  Complete UPPER GI STUDY NEXT WEEK AT [facility]. COMPLETE OPEN MRI IN [place]. REFER TO DR. Arnoldo Morale OR [facility] IN 3 WEEKS. I WILL OBTAIN THE RADIOLOGY REPORTS FROM [place] & REVIEW.  USE VISCOUS LIDOCAINE 2 TSP Q4-6H PRN FOR OR UPPER ABDOMINAL PAIN OR HEARTBURN. USE NO MORE THAN 8 DOSES A DAY. IT WILL MAKE YOUR MOUTH, ESOPHAGUS, AND STOMACH NUMB. STOP ASPIRIN. CONTINUE PROTONIX. USE ZOFRAN AS NEEDED FOR NAUSEA AND VOMITING.  FOLLOW UP IN 4 MOS.   GREATER THAN 50% WAS SPENT IN COUNSELING & COORDINATION OF CARE WITH THE PATIENT: DISCUSSED DIFFERENTIAL DIAGNOSIS, PROCEDURE, BENEFITS, RISKS, AND MANAGEMENT OF CHRONIC ABDOMINAL PAIN/INTERMITTENT OBSTRUCTION. TOTAL ENCOUNTER TIME: 40 MINS.

## 2017-01-19 NOTE — Telephone Encounter (Signed)
Please call Vida Roller at Dr Arnoldo Morale about referral that was sent today. She has some questions about her Insurance needing an Tour manager. [phone]

## 2017-01-19 NOTE — Progress Notes (Signed)
ON RECALL  °

## 2017-01-19 NOTE — Patient Instructions (Addendum)
Complete UPPER GI STUDY NEXT WEEK AT [facility].  COMPLETE OPEN MRI IN .  SEE DR. Arnoldo Morale OR [facility] IN 3 WEEKS.  I WILL OBTAIN THE RADIOLOGY REPORTS FROM [place].   USE VISCOUS LIDOCAINE 2 TSP Q4-6H PRN FOR OR UPPER ABDOMINAL PAIN OR HEARTBURN. USE NO MORE THAN 8 DOSES A DAY. IT WILL MAKE YOUR MOUTH, ESOPHAGUS, AND STOMACH NUMB.  STOP ASPIRIN. CONTINUE PROTONIX.  USE ZOFRAN AS NEEDED FOR NAUSEA AND VOMITING.  CONTINUE TO MONITOR YOUR WEIGHT. ADD BOOST, ENSURE, OR CARNATION INSTANT BREAKFAST WITH SOY MILK 3 CANS OR FOUR  CANS DAILY.  I WILL CHECK VITAMIN B12, FERRITIN, LIVER PANEL, &RBC FOLATE.   FOLLOW UP IN 4 MOS.

## 2017-01-19 NOTE — Assessment & Plan Note (Signed)
ON U/S 2017. PT FAILED TO COMPLETE MRI/MRCP.  RESCHEDULE OPEN MRI IN [place]. PT AWARE SHE NEEDS TO COMPLETE THE STUDY.

## 2017-01-19 NOTE — Assessment & Plan Note (Addendum)
NORMOCYTIC ANEMIA IN SETTING OF POOR PO INTAKE.  CONTINUE TO MONITOR YOUR WEIGHT. ADD BOOST, ENSURE, OR CARNATION INSTANT BREAKFAST WITH SOY MILK 3 CANS OR FOUR  CANS DAILY. CHECK VIT B12, FERRITIN, RBC FOLATE.

## 2017-01-19 NOTE — Progress Notes (Signed)
Subjective:    Patient ID: Jordan Haney, female    DOB: [birth year], 61 y.o.   MRN: [id number]  System, Provider Not In   HPI IN [facility] AND HAD PAIN I RUQ AND LUQ. HAD A TEST AND REPORTS SHE HAD AN ILEUS. SHE HAS HAD PAIN AND SWELLING AND FEEL LIKE SHE HAS AN ULCER IN HER STOMACH. CAME TO OFC LAST MO AND SENT TO ED. TROPONIN LEVEL ELEVATED AND WAS ABSENT HOME. WAS SUPPOSE TO HAVE AN MRI AND HAD MS CHANGES AND HAD TO GO TO ED. REPORTS SHE WAS OBSTRUCTED AND NO NG TUBE. STAYED [place]. SAME AS IT WAS 9 YEARS.  FEELS LIKE SHE CAN'T EAT OR DRINK NOW BECAUSE IT HURTS SO BAD. SURGERY 2013 AND HAD ANOTHER SURGERY IN [place] IN 2014. WEIGHT UP TO HIGHEST OF 114 LBS SINCE 2014. NAUSEA/VOMTIING MOST DAYS AND BAD IN PAST WEEK. REPORTS BLOOD DETECTED IN HER STOOL IN REHAB. HAS PAIN WHEN SHE EATS MOST TIMES. EVEN APPLESAUCE (RUQ, NO RADIATION) DENIES NSAIDS. ON ASA 81 MG DAILY SINCE 3 MOS. FEEL BLOATED AND SWOLLEN AFTER EATING OR DRINKING.   PT DENIES FEVER, CHILLS, HEMATOCHEZIA, HEMATEMESIS,melena, diarrhea, CHEST PAIN, SHORTNESS OF BREATH, CHANGE IN BOWEL IN HABITS, constipation, abdominal pain, problems swallowing, OR heartburn or indigestion.  Past Medical History:  Diagnosis Date  . Anemia   . Anxiety   . Chronic abdominal pain   . Constipation 03/21/2011  . Dilated pancreatic duct    ?chronic pancreatitis   . Duodenal ulcer    remote per patient. +BC powders, patient report negative EGD six months ago  . Essential hypertension 06/09/2015  . Fatty liver 03/21/2011  . Hypokalemia 03/21/2011  . Kidney stones   . Lumbar stress fracture   . Pancreatitis    states in remote past, very severe, ?biliary pancreatitis   . Pneumonia    as child  . RA (rheumatoid arthritis) ([place])  2008  . T12 compression fracture ([place]) 2011    Past Surgical History:  Procedure Laterality Date  . ABDOMINAL HYSTERECTOMY    . APPENDECTOMY    . BACK SURGERY  2013  . CARDIAC  CATHETERIZATION     2008  . CHOLECYSTECTOMY    . COLONOSCOPY N/A 08/16/2012   Dr. Gala Romney: normal but inadequate prep. Consider early interval colonoscopy.   . complete hysterectomy    . ESOPHAGOGASTRODUODENOSCOPY  08/2010   Dr. Earley Brooke, Versed. small hh, mild prepylori gastritis. path unavailable  . ESOPHAGOGASTRODUODENOSCOPY  04/2010   Dr. Earley Brooke, Versed. small hh, mild distal esophagitis, antral gastritis and duodenitis. Bx mild chronic gastritis and no H.pylori  . ESOPHAGOGASTRODUODENOSCOPY  08/2009   Dr. Posey Pronto, Versed. Moderate gastritis, moderate duodenitis with nodularity in proximal duodenal bulb, bx chronic gastritis, no helicobacter, mild chronic duodenitis  . ESOPHAGOGASTRODUODENOSCOPY  02/2007   Dr. Earley Brooke, Versed. antral gastritis and duodenal ulcers. Bx mild chronic gastritis. No bx from duodenal ulcer available.  . ESOPHAGOGASTRODUODENOSCOPY  04/06/11   Dr. Oneida Alar: 1-2 cm hiatal hernia, mod gastritis, 52mmX3mm linear ulcer in duodenal bulb, stricture 1st part of duodenum, dilated to 12 mm  . ESOPHAGOGASTRODUODENOSCOPY  05/12/11   partial gastric oulet obstruction secondary to stricture between bulb/2nd portion of duodenum with marked friability and inflammation but no discrete ulcer, dilated stomach. s/p dilation but high risk for restenosis  . ESOPHAGOGASTRODUODENOSCOPY  06/22/2011   OIZ:[id number] ring/Moderate gastritis/PERSISTENT Ulcer in the bulb/descending duodenum Stricture in the bulb/descending duodenum  . ESOPHAGOGASTRODUODENOSCOPY Left 06/25/2012   RMR:  Normal esophagus. Surgically altered anastomosis to  small intestine noted. Couple of  at the anastomosis.  Patent efferent limb Patient had an oozing 1 cm anastomotic ulcer  . ESOPHAGOGASTRODUODENOSCOPY Left 10/23/2012   Dr. Gala Romney: mild erosive reflux esophagitis, s/p hemigastrectomy, prior site of gastric ulcer healed   . ESOPHAGOGASTRODUODENOSCOPY N/A 06/11/2015   Dr. Gala Romney: normal esophagus, roux-en-y anastomosis   . EXPLORATORY  LAPAROTOMY W/ BOWEL RESECTION     2014, sometime in the summer   . GASTROJEJUNOSTOMY  06/28/2011   Roux-en-Y with choledochoduodenostomy   . KIDNEY STONE SURGERY    . KNEE SURGERY    . KYPHOPLASTY  Jan 2017   Hackberry, Juliann Pulse   . SAVORY DILATION  04/06/2011   Procedure: SAVORY DILATION;  Surgeon: Dorothyann Peng, MD;  Location: [facility];  Service: Endoscopy;  Laterality: N/A;  16 mm  . TOTAL HIP ARTHROPLASTY Left 01/2015   in [place], [place]  . TOTAL HIP ARTHROPLASTY    . ventral wall hernia      Allergies  Allergen Reactions  . Compazine Anaphylaxis  . Phenergan [Promethazine] Anaphylaxis  . Vistaril [Hydroxyzine Hcl] Other (See Comments)    Reaction: legs shake  . Benadryl [Diphenhydramine Hcl] Other (See Comments)    Pt states, "it makes my legs shake really bad"   . Gabapentin Other (See Comments)    Reactions: legs shake  . Latex Swelling  . Morphine And Related Hives    Gives her a severe headache    Current Outpatient Prescriptions  Medication Sig Dispense Refill  . aspirin EC 81 MG tablet Take 81 mg by mouth daily.    Marland Kitchen lisinopril (PRINIVIL,ZESTRIL) 20 MG tablet Take 40 mg by mouth daily.     . ondansetron (ZOFRAN ODT) 4 MG disintegrating tablet Take 1 tablet (4 mg total) by mouth every 8 (eight) hours as needed for nausea.    . pantoprazole (PROTONIX) 40 MG tablet Take 1 tablet (40 mg total) by mouth 2 (two) times daily.    . SENNA S 8.6-50 MG tablet Take 1 tablet by mouth 2 (two) times daily.      Family History  Problem Relation Age of Onset  . Heart attack Mother   . Colon cancer Neg Hx   . Liver cancer Neg Hx   . Breast cancer Neg Hx   . Inflammatory bowel disease Neg Hx    Review of Systems PER HPI OTHERWISE ALL SYSTEMS ARE NEGATIVE.     Objective:   Physical Exam  Constitutional: She is oriented to person, place, and time. She appears well-developed and well-nourished. No distress.  HENT:  Head: Normocephalic and atraumatic.  Mouth/Throat: Oropharynx is  clear and moist. No oropharyngeal exudate.  Eyes: Pupils are equal, round, and reactive to light. No scleral icterus.  Neck: Normal range of motion. Neck supple.  Cardiovascular: Normal rate, regular rhythm and normal heart sounds.   Pulmonary/Chest: Effort normal and breath sounds normal. No respiratory distress.  Abdominal: Soft. Bowel sounds are normal. She exhibits no distension. There is tenderness. There is no rebound and no guarding.  MILD TTP IN THE BUQS. MIDLINE INCISIONS WELL HEALED  Musculoskeletal: She exhibits no edema.  Lymphadenopathy:    She has no cervical adenopathy.  Neurological: She is alert and oriented to person, place, and time.  Psychiatric:  FLAT AFFECT, SLIGHTLY ANXIOUS MOOD  Vitals reviewed.     Assessment & Plan:

## 2017-01-19 NOTE — Addendum Note (Signed)
Addended by: Danie Binder on: 01/19/2017 10:58 AM   Modules accepted: Level of Service

## 2017-01-20 NOTE — Telephone Encounter (Signed)
Spoke to [facility] at Dr. Arnoldo Morale office this morning. Informed her that to my knowledge pt doesn't need an authorization from [place].

## 2017-01-25 ENCOUNTER — Ambulatory Visit (HOSPITAL_COMMUNITY)

## 2017-01-25 NOTE — Progress Notes (Signed)
NO PCP PER PATIENT °

## 2017-01-27 ENCOUNTER — Ambulatory Visit: Admitting: General Surgery

## 2017-02-02 ENCOUNTER — Other Ambulatory Visit

## 2017-02-15 ENCOUNTER — Ambulatory Visit: Admitting: General Surgery

## 2017-03-24 ENCOUNTER — Encounter: Payer: Self-pay | Admitting: Gastroenterology
